# Patient Record
Sex: Male | Born: 1955 | Race: White | Hispanic: No | Marital: Married | Smoking: Current every day smoker
Health system: Southern US, Community
[De-identification: ages and names within clinical notes are randomized; demographics above are authoritative.]

## PROBLEM LIST (undated history)

## (undated) DIAGNOSIS — E119 Type 2 diabetes mellitus without complications: Secondary | ICD-10-CM

## (undated) DIAGNOSIS — Z8719 Personal history of other diseases of the digestive system: Secondary | ICD-10-CM

## (undated) DIAGNOSIS — K7460 Unspecified cirrhosis of liver: Secondary | ICD-10-CM

## (undated) DIAGNOSIS — K759 Inflammatory liver disease, unspecified: Secondary | ICD-10-CM

## (undated) DIAGNOSIS — I639 Cerebral infarction, unspecified: Secondary | ICD-10-CM

## (undated) DIAGNOSIS — I10 Essential (primary) hypertension: Secondary | ICD-10-CM

## (undated) DIAGNOSIS — J449 Chronic obstructive pulmonary disease, unspecified: Secondary | ICD-10-CM

## (undated) HISTORY — PX: BACK SURGERY: SHX140

## (undated) HISTORY — PX: SHOULDER SURGERY: SHX246

## (undated) HISTORY — DX: Cerebral infarction, unspecified: I63.9

## (undated) HISTORY — DX: Personal history of other diseases of the digestive system: Z87.19

## (undated) HISTORY — PX: KNEE SURGERY: SHX244

---

## 1998-04-02 ENCOUNTER — Emergency Department (HOSPITAL_COMMUNITY): Admission: EM | Admit: 1998-04-02 | Discharge: 1998-04-02 | Payer: Self-pay | Admitting: Emergency Medicine

## 2000-04-07 ENCOUNTER — Emergency Department (HOSPITAL_COMMUNITY): Admission: EM | Admit: 2000-04-07 | Discharge: 2000-04-07 | Payer: Self-pay | Admitting: *Deleted

## 2000-04-21 ENCOUNTER — Ambulatory Visit (HOSPITAL_COMMUNITY)
Admission: RE | Admit: 2000-04-21 | Discharge: 2000-04-21 | Payer: Self-pay | Admitting: Physical Medicine and Rehabilitation

## 2000-04-21 ENCOUNTER — Encounter: Payer: Self-pay | Admitting: Physical Medicine and Rehabilitation

## 2000-09-04 ENCOUNTER — Encounter: Admission: RE | Admit: 2000-09-04 | Discharge: 2000-09-04 | Payer: Self-pay | Admitting: Family Medicine

## 2001-05-17 ENCOUNTER — Observation Stay (HOSPITAL_COMMUNITY): Admission: RE | Admit: 2001-05-17 | Discharge: 2001-05-18 | Payer: Self-pay | Admitting: Orthopedic Surgery

## 2001-05-17 ENCOUNTER — Encounter: Payer: Self-pay | Admitting: Orthopedic Surgery

## 2001-12-20 ENCOUNTER — Emergency Department (HOSPITAL_COMMUNITY): Admission: EM | Admit: 2001-12-20 | Discharge: 2001-12-20 | Payer: Self-pay | Admitting: Emergency Medicine

## 2001-12-20 ENCOUNTER — Encounter: Payer: Self-pay | Admitting: Emergency Medicine

## 2003-08-21 ENCOUNTER — Encounter: Admission: RE | Admit: 2003-08-21 | Discharge: 2003-08-21 | Payer: Self-pay | Admitting: Family Medicine

## 2003-08-21 ENCOUNTER — Ambulatory Visit (HOSPITAL_COMMUNITY): Admission: RE | Admit: 2003-08-21 | Discharge: 2003-08-21 | Payer: Self-pay | Admitting: Family Medicine

## 2003-10-30 ENCOUNTER — Encounter: Admission: RE | Admit: 2003-10-30 | Discharge: 2003-10-30 | Payer: Self-pay | Admitting: Sports Medicine

## 2003-10-30 ENCOUNTER — Encounter: Admission: RE | Admit: 2003-10-30 | Discharge: 2003-10-30 | Payer: Self-pay | Admitting: Family Medicine

## 2005-06-26 ENCOUNTER — Ambulatory Visit (HOSPITAL_BASED_OUTPATIENT_CLINIC_OR_DEPARTMENT_OTHER): Admission: RE | Admit: 2005-06-26 | Discharge: 2005-06-26 | Payer: Self-pay | Admitting: Orthopedic Surgery

## 2005-06-26 ENCOUNTER — Ambulatory Visit (HOSPITAL_COMMUNITY): Admission: RE | Admit: 2005-06-26 | Discharge: 2005-06-26 | Payer: Self-pay | Admitting: Orthopedic Surgery

## 2006-03-05 ENCOUNTER — Inpatient Hospital Stay (HOSPITAL_COMMUNITY): Admission: RE | Admit: 2006-03-05 | Discharge: 2006-03-07 | Payer: Self-pay | Admitting: Orthopaedic Surgery

## 2006-11-19 DIAGNOSIS — M545 Low back pain, unspecified: Secondary | ICD-10-CM | POA: Insufficient documentation

## 2006-11-19 DIAGNOSIS — F17200 Nicotine dependence, unspecified, uncomplicated: Secondary | ICD-10-CM

## 2017-05-07 DIAGNOSIS — C186 Malignant neoplasm of descending colon: Secondary | ICD-10-CM

## 2018-09-02 ENCOUNTER — Emergency Department (HOSPITAL_COMMUNITY): Payer: Medicare Other

## 2018-09-02 ENCOUNTER — Inpatient Hospital Stay (HOSPITAL_COMMUNITY)
Admission: EM | Admit: 2018-09-02 | Discharge: 2018-09-07 | DRG: 981 | Disposition: A | Discharge status: Skilled Nursing Facility | Payer: Medicare Other | Attending: Internal Medicine | Admitting: Internal Medicine

## 2018-09-02 ENCOUNTER — Other Ambulatory Visit: Payer: Self-pay

## 2018-09-02 ENCOUNTER — Encounter (HOSPITAL_COMMUNITY): Payer: Self-pay | Admitting: Emergency Medicine

## 2018-09-02 DIAGNOSIS — R29703 NIHSS score 3: Secondary | ICD-10-CM | POA: Diagnosis not present

## 2018-09-02 DIAGNOSIS — I6340 Cerebral infarction due to embolism of unspecified cerebral artery: Secondary | ICD-10-CM | POA: Diagnosis present

## 2018-09-02 DIAGNOSIS — W1830XA Fall on same level, unspecified, initial encounter: Secondary | ICD-10-CM | POA: Diagnosis present

## 2018-09-02 DIAGNOSIS — R29702 NIHSS score 2: Secondary | ICD-10-CM | POA: Diagnosis not present

## 2018-09-02 DIAGNOSIS — K7460 Unspecified cirrhosis of liver: Secondary | ICD-10-CM | POA: Diagnosis present

## 2018-09-02 DIAGNOSIS — R488 Other symbolic dysfunctions: Secondary | ICD-10-CM | POA: Diagnosis present

## 2018-09-02 DIAGNOSIS — G9340 Encephalopathy, unspecified: Secondary | ICD-10-CM

## 2018-09-02 DIAGNOSIS — C189 Malignant neoplasm of colon, unspecified: Secondary | ICD-10-CM | POA: Diagnosis present

## 2018-09-02 DIAGNOSIS — F17200 Nicotine dependence, unspecified, uncomplicated: Secondary | ICD-10-CM | POA: Diagnosis present

## 2018-09-02 DIAGNOSIS — R4182 Altered mental status, unspecified: Secondary | ICD-10-CM

## 2018-09-02 DIAGNOSIS — Z8673 Personal history of transient ischemic attack (TIA), and cerebral infarction without residual deficits: Secondary | ICD-10-CM | POA: Diagnosis not present

## 2018-09-02 DIAGNOSIS — Z419 Encounter for procedure for purposes other than remedying health state, unspecified: Secondary | ICD-10-CM

## 2018-09-02 DIAGNOSIS — G3184 Mild cognitive impairment, so stated: Secondary | ICD-10-CM | POA: Diagnosis present

## 2018-09-02 DIAGNOSIS — D696 Thrombocytopenia, unspecified: Secondary | ICD-10-CM | POA: Diagnosis present

## 2018-09-02 DIAGNOSIS — Z8619 Personal history of other infectious and parasitic diseases: Secondary | ICD-10-CM | POA: Diagnosis not present

## 2018-09-02 DIAGNOSIS — R29704 NIHSS score 4: Secondary | ICD-10-CM | POA: Diagnosis present

## 2018-09-02 DIAGNOSIS — I10 Essential (primary) hypertension: Secondary | ICD-10-CM | POA: Diagnosis present

## 2018-09-02 DIAGNOSIS — I6381 Other cerebral infarction due to occlusion or stenosis of small artery: Secondary | ICD-10-CM

## 2018-09-02 DIAGNOSIS — R4701 Aphasia: Secondary | ICD-10-CM | POA: Diagnosis present

## 2018-09-02 DIAGNOSIS — S72001A Fracture of unspecified part of neck of right femur, initial encounter for closed fracture: Secondary | ICD-10-CM | POA: Diagnosis present

## 2018-09-02 DIAGNOSIS — Y92129 Unspecified place in nursing home as the place of occurrence of the external cause: Secondary | ICD-10-CM | POA: Diagnosis not present

## 2018-09-02 DIAGNOSIS — Z79891 Long term (current) use of opiate analgesic: Secondary | ICD-10-CM

## 2018-09-02 DIAGNOSIS — G9341 Metabolic encephalopathy: Secondary | ICD-10-CM | POA: Diagnosis present

## 2018-09-02 DIAGNOSIS — E119 Type 2 diabetes mellitus without complications: Secondary | ICD-10-CM | POA: Diagnosis present

## 2018-09-02 DIAGNOSIS — J439 Emphysema, unspecified: Secondary | ICD-10-CM | POA: Diagnosis present

## 2018-09-02 DIAGNOSIS — Z79899 Other long term (current) drug therapy: Secondary | ICD-10-CM

## 2018-09-02 DIAGNOSIS — M316 Other giant cell arteritis: Secondary | ICD-10-CM | POA: Diagnosis present

## 2018-09-02 DIAGNOSIS — Z8781 Personal history of (healed) traumatic fracture: Secondary | ICD-10-CM

## 2018-09-02 DIAGNOSIS — Z9889 Other specified postprocedural states: Secondary | ICD-10-CM

## 2018-09-02 HISTORY — DX: Unspecified cirrhosis of liver: K74.60

## 2018-09-02 HISTORY — DX: Fracture of unspecified part of neck of right femur, initial encounter for closed fracture: S72.001A

## 2018-09-02 HISTORY — DX: Other cerebral infarction due to occlusion or stenosis of small artery: I63.81

## 2018-09-02 HISTORY — DX: Encephalopathy, unspecified: G93.40

## 2018-09-02 HISTORY — DX: Type 2 diabetes mellitus without complications: E11.9

## 2018-09-02 HISTORY — DX: Essential (primary) hypertension: I10

## 2018-09-02 HISTORY — DX: Chronic obstructive pulmonary disease, unspecified: J44.9

## 2018-09-02 HISTORY — DX: Inflammatory liver disease, unspecified: K75.9

## 2018-09-02 LAB — COMPREHENSIVE METABOLIC PANEL
ALBUMIN: 4.2 g/dL (ref 3.5–5.0)
ALT: 18 U/L (ref 0–44)
ANION GAP: 13 (ref 5–15)
AST: 23 U/L (ref 15–41)
Alkaline Phosphatase: 117 U/L (ref 38–126)
BUN: 9 mg/dL (ref 8–23)
CO2: 22 mmol/L (ref 22–32)
CREATININE: 0.93 mg/dL (ref 0.61–1.24)
Calcium: 9.3 mg/dL (ref 8.9–10.3)
Chloride: 104 mmol/L (ref 98–111)
GFR calc Af Amer: >60 mL/min (ref >60)
GFR calc non Af Amer: >60 mL/min (ref >60)
Glucose, Bld: 188 mg/dL — ABNORMAL HIGH (ref 70–99)
Potassium: 4 mmol/L (ref 3.5–5.1)
Sodium: 139 mmol/L (ref 135–145)
Total Bilirubin: 2.2 mg/dL — ABNORMAL HIGH (ref 0.3–1.2)
Total Protein: 6.7 g/dL (ref 6.5–8.1)

## 2018-09-02 LAB — CBC WITH DIFFERENTIAL/PLATELET
Abs Immature Granulocytes: 0.03 10*3/uL (ref 0.00–0.07)
BASOS ABS: 0 10*3/uL (ref 0.0–0.1)
Basophils Relative: 0 %
Eosinophils Absolute: 0.3 10*3/uL (ref 0.0–0.5)
Eosinophils Relative: 3 %
HCT: 50.1 % (ref 39.0–52.0)
Hemoglobin: 16.1 g/dL (ref 13.0–17.0)
Immature Granulocytes: 0 %
Lymphocytes Relative: 10 %
Lymphs Abs: 0.9 10*3/uL (ref 0.7–4.0)
MCH: 28.3 pg (ref 26.0–34.0)
MCHC: 32.1 g/dL (ref 30.0–36.0)
MCV: 88.2 fL (ref 80.0–100.0)
Monocytes Absolute: 0.6 10*3/uL (ref 0.1–1.0)
Monocytes Relative: 7 %
Neutro Abs: 6.8 10*3/uL (ref 1.7–7.7)
Neutrophils Relative %: 80 %
Platelets: 90 10*3/uL — ABNORMAL LOW (ref 150–400)
RBC: 5.68 MIL/uL (ref 4.22–5.81)
RDW: 12.9 % (ref 11.5–15.5)
WBC: 8.6 10*3/uL (ref 4.0–10.5)
nRBC: 0 % (ref 0.0–0.2)

## 2018-09-02 LAB — URINALYSIS, COMPLETE (UACMP) WITH MICROSCOPIC
Bilirubin Urine: NEGATIVE
GLUCOSE, UA: 50 mg/dL — AB
Hgb urine dipstick: NEGATIVE
KETONES UR: 5 mg/dL — AB
Leukocytes, UA: NEGATIVE
Nitrite: NEGATIVE
Protein, ur: NEGATIVE mg/dL
SPECIFIC GRAVITY, URINE: 1.012 (ref 1.005–1.030)
pH: 6 (ref 5.0–8.0)

## 2018-09-02 LAB — I-STAT VENOUS BLOOD GAS, ED
Acid-Base Excess: 3 mmol/L — ABNORMAL HIGH (ref 0.0–2.0)
Bicarbonate: 25.4 mmol/L (ref 20.0–28.0)
O2 SAT: 86 %
PCO2 VEN: 32.3 mmHg — AB (ref 44.0–60.0)
TCO2: 26 mmol/L (ref 22–32)
pH, Ven: 7.504 — ABNORMAL HIGH (ref 7.250–7.430)
pO2, Ven: 46 mmHg — ABNORMAL HIGH (ref 32.0–45.0)

## 2018-09-02 LAB — ETHANOL: Alcohol, Ethyl (B): <10 mg/dL (ref <10)

## 2018-09-02 LAB — RAPID URINE DRUG SCREEN, HOSP PERFORMED
Amphetamines: NOT DETECTED
Barbiturates: NOT DETECTED
Benzodiazepines: NOT DETECTED
Cocaine: NOT DETECTED
Opiates: NOT DETECTED
Tetrahydrocannabinol: NOT DETECTED

## 2018-09-02 LAB — CBG MONITORING, ED: Glucose-Capillary: 191 mg/dL — ABNORMAL HIGH (ref 70–99)

## 2018-09-02 LAB — I-STAT CG4 LACTIC ACID, ED: Lactic Acid, Venous: 2.06 mmol/L (ref 0.5–1.9)

## 2018-09-02 LAB — AMMONIA: AMMONIA: 27 umol/L (ref 9–35)

## 2018-09-02 MED ORDER — MORPHINE SULFATE (PF) 2 MG/ML IV SOLN
0.5000 mg | INTRAVENOUS | Status: DC | PRN
  Administered 2018-09-04: 0.5 mg via INTRAVENOUS
  Filled 2018-09-02: qty 1

## 2018-09-02 MED ORDER — HYDROCODONE-ACETAMINOPHEN 5-325 MG PO TABS
1.0000 | ORAL_TABLET | Freq: Four times a day (QID) | ORAL | Status: DC | PRN
  Administered 2018-09-03 – 2018-09-04 (×3): 1 via ORAL
  Filled 2018-09-02 (×3): qty 1

## 2018-09-02 MED ORDER — PREGABALIN 75 MG PO CAPS
150.0000 mg | ORAL_CAPSULE | Freq: Two times a day (BID) | ORAL | Status: DC
  Administered 2018-09-03 – 2018-09-07 (×8): 150 mg via ORAL
  Filled 2018-09-02 (×8): qty 2

## 2018-09-02 MED ORDER — SODIUM CHLORIDE 0.9 % IV BOLUS
1000.0000 mL | Freq: Once | INTRAVENOUS | Status: AC
  Administered 2018-09-02: 1000 mL via INTRAVENOUS

## 2018-09-02 MED ORDER — IOHEXOL 300 MG/ML  SOLN
100.0000 mL | Freq: Once | INTRAMUSCULAR | Status: AC | PRN
  Administered 2018-09-02: 100 mL via INTRAVENOUS

## 2018-09-02 MED ORDER — STROKE: EARLY STAGES OF RECOVERY BOOK
Freq: Once | Status: AC
  Administered 2018-09-03: 01:00:00
  Filled 2018-09-02: qty 1

## 2018-09-02 NOTE — ED Notes (Signed)
Admitting MD Gardner at bedside. 

## 2018-09-02 NOTE — H&P (Signed)
History and Physical    Weyman Bogdon QVZ:[id number] DOB: 19-Sep-1956 DOA: 09/02/2018  PCP: Patient, No Pcp Per  Patient coming from: Home  I have personally briefly reviewed patient's old medical records in [place]  Chief Complaint: AMS  HPI: Taiyo Kozma is a 62 y.o. male with medical history significant of COPD, cirrhosis, HTN.  Patient presents to the ED with confusion and AMS.  Had been disoriented for past couple of months per granddaughter but acutely worse after a fall yesterday.  Yesterday was at [facility] visiting wife.  Fell.  Severe R hip pain following fall worse with ambulation or movement.  Symptoms persistent today.  Does take chronic oxycodone and robaxin.   ED Course: R hip nondisp femoral neck fx.  Ortho consulted and will see patient in AM.  CT head shows age indeterminate lacunar infarcts.   Review of Systems: As per HPI otherwise 10 point review of systems negative.   Past Medical History:  Diagnosis Date  . Cirrhosis ([place])   . COPD (chronic obstructive pulmonary disease) ([place])   . Diabetes mellitus without complication ([place])   . Hepatitis   . Hypertension     Past Surgical History:  Procedure Laterality Date  . BACK SURGERY    . KNEE SURGERY    . SHOULDER SURGERY       reports that he has been smoking. He does not have any smokeless tobacco history on file. He reports that he does not drink alcohol or use drugs.  No Known Allergies  Family History  Problem Relation Age of Onset  . Osteoporosis Neg Hx      Prior to Admission medications   Medication Sig Start Date End Date Taking? Authorizing Provider  methocarbamol (ROBAXIN) 750 MG tablet Take 750-1,500 mg by mouth See admin instructions. Take 1 tablet by mouth morning/noon as needed and take 2 tabs at bedtime as needed   Yes [provider]  oxyCODONE (ROXICODONE) 15 MG immediate release tablet Take 15 mg by mouth 3 (three) times daily.   Yes [provider]    pregabalin (LYRICA) 150 MG capsule Take 150 mg by mouth 2 (two) times daily.   Yes [provider]    Physical Exam: Vitals:   09/02/18 2115 09/02/18 2215 09/02/18 2245 09/02/18 2300  BP: (!) 179/92 (!) 152/110    Pulse: 80 93 84 79  Resp: 15 18 16 16   Temp:      TempSrc:      SpO2: 97% 97% 98% 97%  Weight:      Height:        Constitutional: NAD, calm, comfortable Eyes: PERRL, lids and conjunctivae normal ENMT: Mucous membranes are moist. Posterior pharynx clear of any exudate or lesions.Normal dentition.  Neck: normal, supple, no masses, no thyromegaly Respiratory: clear to auscultation bilaterally, no wheezing, no crackles. Normal respiratory effort. No accessory muscle use.  Cardiovascular: Regular rate and rhythm, no murmurs / rubs / gallops. No extremity edema. 2+ pedal pulses. No carotid bruits.  Abdomen: no tenderness, no masses palpated. No hepatosplenomegaly. Bowel sounds positive.  Musculoskeletal: R hip TTP Skin: no rashes, lesions, ulcers. No induration Neurologic: CN 2-12 grossly intact. Sensation intact, DTR normal. Strength 5/5 in all 4.  Psychiatric: Mild confusion  Labs on Admission: I have personally reviewed following labs and imaging studies  CBC: Recent Labs  Lab 09/02/18 1844  WBC 8.6  NEUTROABS 6.8  HGB 16.1  HCT 50.1  MCV 88.2  PLT 90*   Basic  Metabolic Panel: Recent Labs  Lab 09/02/18 1844  NA 139  K 4.0  CL 104  CO2 22  GLUCOSE 188*  BUN 9  CREATININE 0.93  CALCIUM 9.3   GFR: Estimated Creatinine Clearance: 89.2 mL/min (by C-G formula based on SCr of 0.93 mg/dL). Liver Function Tests: Recent Labs  Lab 09/02/18 1844  AST 23  ALT 18  ALKPHOS 117  BILITOT 2.2*  PROT 6.7  ALBUMIN 4.2   No results for input(s): LIPASE, AMYLASE in the last 168 hours. Recent Labs  Lab 09/02/18 1845  AMMONIA 27   Coagulation Profile: No results for input(s): INR, PROTIME in the last 168 hours. Cardiac Enzymes: No results for  input(s): CKTOTAL, CKMB, CKMBINDEX, TROPONINI in the last 168 hours. BNP (last 3 results) No results for input(s): PROBNP in the last 8760 hours. HbA1C: No results for input(s): HGBA1C in the last 72 hours. CBG: Recent Labs  Lab 09/02/18 1857  GLUCAP 191*   Lipid Profile: No results for input(s): CHOL, HDL, LDLCALC, TRIG, CHOLHDL, LDLDIRECT in the last 72 hours. Thyroid Function Tests: No results for input(s): TSH, T4TOTAL, FREET4, T3FREE, THYROIDAB in the last 72 hours. Anemia Panel: No results for input(s): VITAMINB12, FOLATE, FERRITIN, TIBC, IRON, RETICCTPCT in the last 72 hours. Urine analysis:    Component Value Date/Time   COLORURINE YELLOW 09/02/2018 2009   APPEARANCEUR CLEAR 09/02/2018 2009   LABSPEC 1.012 09/02/2018 2009   PHURINE 6.0 09/02/2018 2009   GLUCOSEU 50 (A) 09/02/2018 2009   HGBUR NEGATIVE 09/02/2018 2009   [place] NEGATIVE 09/02/2018 2009   KETONESUR 5 (A) 09/02/2018 2009   PROTEINUR NEGATIVE 09/02/2018 2009   NITRITE NEGATIVE 09/02/2018 2009   LEUKOCYTESUR NEGATIVE 09/02/2018 2009    Radiological Exams on Admission: Ct Head Wo Contrast  Result Date: 09/02/2018 CLINICAL DATA:  Disoriented EXAM: CT HEAD WITHOUT CONTRAST TECHNIQUE: Contiguous axial images were obtained from the base of the skull through the vertex without intravenous contrast. COMPARISON:  MRI 05/07/2005 FINDINGS: Brain: No acute territorial infarction, hemorrhage or intracranial mass is visualized. Chronic infarct within the left basal ganglia and white matter. Age indeterminate small lacunar infarcts within the thalamus and right basal ganglia. Atrophy with moderate small vessel ischemic changes of the white matter. Stable ventricle size. Vascular: No hyperdense vessels.  Carotid vascular calcification. Skull: Normal. Negative for fracture or focal lesion. Sinuses/Orbits: Mucosal thickening in the ethmoid and maxillary sinuses. Other: None. IMPRESSION: 1. Small age indeterminate lacunar  infarcts within the thalamus and right basal ganglia. 2. 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 Atrophy and small vessel ischemic changes of the white matter. Electronically Signed   By: Donavan Foil M.D.   On: 09/02/2018 21:25   Ct Abdomen Pelvis W Contrast  Result Date: 09/02/2018 CLINICAL DATA:  Abdominal distension EXAM: CT ABDOMEN AND PELVIS WITH CONTRAST TECHNIQUE: Multidetector CT imaging of the abdomen and pelvis was performed using the standard protocol following bolus administration of intravenous contrast. CONTRAST:  165mL OMNIPAQUE IOHEXOL 300 MG/ML  SOLN COMPARISON:  05/21/2011 FINDINGS: Lower chest: Lung bases demonstrate a stable right lower lobe pulmonary nodule. No acute consolidation or effusion. The heart size is normal. Hepatobiliary: Subtle contour nodularity of the liver suggesting cirrhosis. Status post cholecystectomy. No biliary dilatation. Pancreas: Unremarkable. No pancreatic ductal dilatation or surrounding inflammatory changes. Spleen: Slightly enlarged. Adrenals/Urinary Tract: Adrenal glands are normal. No hydronephrosis. Small stones in the mid right kidney measuring up to 4 mm. Cyst in the lower pole of the left kidney. Bladder is  normal. Stomach/Bowel: Stomach nonenlarged. No dilated small bowel. No colon wall thickening. Negative appendix. Vascular/Lymphatic: Moderate aortic atherosclerosis and mural thrombus. No aneurysm. No significantly enlarged lymph nodes. Reproductive: Slightly enlarged prostate Other: No free air or free fluid. Musculoskeletal: Postsurgical changes at L5-S1 with stabilization rods and fixating screws. Acute, slightly impacted appearing right femoral neck fracture. IMPRESSION: 1. No CT evidence for acute intra-abdominal or pelvic abnormality. 2. Subtle contour nodularity of the liver suggesting cirrhosis. The spleen appears slightly enlarged 3. Findings suspicious for acute mildly impacted right subcapital femoral neck fracture. 4.  Nonobstructing stones in the right kidney Electronically Signed   By: Donavan Foil M.D.   On: 09/02/2018 21:19   Dg Chest Port 1 View  Result Date: 09/02/2018 CLINICAL DATA:  Altered mental status. EXAM: PORTABLE CHEST 1 VIEW COMPARISON:  Radiographs of November 03, 2016. FINDINGS: The heart size and mediastinal contours are within normal limits. Both lungs are clear. The visualized skeletal structures are unremarkable. IMPRESSION: No active disease. Electronically Signed   By: Marijo Conception, M.D.   On: 09/02/2018 19:01   Dg Hip Unilat With Pelvis 2-3 Views Right  Result Date: 09/02/2018 CLINICAL DATA:  62 year old male with fall and right hip pain. EXAM: DG HIP (WITH OR WITHOUT PELVIS) 2-3V RIGHT COMPARISON:  CT of the abdomen pelvis dated 09/02/2018 FINDINGS: There is a nondisplaced mildly impacted fracture of the right femoral neck with foreshortening of the femoral neck. No other acute fracture identified. The bones are osteopenic. There is no dislocation. Mild to moderate bilateral hip arthritic changes. Lower lumbar fusion screws. The soft tissues are grossly unremarkable. Excreted contrast noted within the bladder. IMPRESSION: Nondisplaced right femoral neck fracture.  No dislocation. Electronically Signed   By: Anner Crete M.D.   On: 09/02/2018 22:48    EKG: Independently reviewed.  Assessment/Plan Principal Problem:   Fracture of femoral neck, right, closed (HCC) Active Problems:   Acute encephalopathy   Multiple lacunar infarcts (HCC)    1. R femoral neck fx - 1. Hip fx pathway 2. NPO after MN 3. Ortho consulted and to see in AM for likely operative repair 2. Acute on chronic encephalopathy - 1. Acute component believed to be either due to pain from fracture or due to increased opiates hes taking for pain (since he was managing to ambulate around on the fracture today somehow). 2. Concern chronic component may be related to lacunar infarcts 3. Will obtain MRI brain  since these listed as age indeterminate. 4. Will put on stroke pathway for work up. 5. Holding off on ASA for the moment since he will need surgery for hip. 6. Consult neuro based on results of MRI brain.  DVT prophylaxis: SCDs Code Status: Full Family Communication: Family at bedside Disposition Plan: SNF after admit Consults called: Ortho Admission status: Admit to inpatient  Severity of Illness: The appropriate patient status for this patient is INPATIENT. Inpatient status is judged to be reasonable and necessary in order to provide the required intensity of service to ensure the patient's safety. The patient's presenting symptoms, physical exam findings, and initial radiographic and laboratory data in the context of their chronic comorbidities is felt to place them at high risk for further clinical deterioration. Furthermore, it is not anticipated that the patient will be medically stable for discharge from the hospital within 2 midnights of admission. The following factors support the patient status of inpatient.   " The patient's presenting symptoms include AMS, Hip pain. " The worrisome physical  exam findings include hip pain. " The initial radiographic and laboratory data are worrisome because of Hip fx, age indeterminate lacunar infarcts.   * I certify that at the point of admission it is my clinical judgment that the patient will require inpatient hospital care spanning beyond 2 midnights from the point of admission due to high intensity of service, high risk for further deterioration and high frequency of surveillance required.Etta Quill DO [facility] Pager [phone] Only works nights!  If 7AM-7PM, please contact the primary day team physician taking care of patient  [email or web address] Password [facility]  09/02/2018, 11:28 PM

## 2018-09-02 NOTE — ED Notes (Signed)
Patient transported to X-ray 

## 2018-09-02 NOTE — ED Provider Notes (Signed)
[name] Provider Note   CSN: [id number] Arrival date & time: 09/02/18  7673     History   Chief Complaint Chief Complaint  Patient presents with  . Altered Mental Status    HPI Matthew Roman is a 62 y.o. male.  The history is provided by the patient and a relative.  Altered Mental Status   This is a recurrent problem. The current episode started more than 1 week ago. Progression since onset: waxing and waning. Associated symptoms include confusion. Pertinent negatives include no seizures, no unresponsiveness, no weakness, no delusions, no self-injury and no violence. Risk factors: unknown, cirrhosis hx. His past medical history is significant for liver disease, hypertension and COPD. His past medical history does not include dementia or head trauma.    Past Medical History:  Diagnosis Date  . Cirrhosis ([place])   . COPD (chronic obstructive pulmonary disease) ([place])   . Diabetes mellitus without complication ([place])   . Hepatitis   . Hypertension     Patient Active Problem List   Diagnosis Date Noted  . Acute encephalopathy 09/02/2018  . Multiple lacunar infarcts ([place]) 09/02/2018  . Fracture of femoral neck, right, closed ([place]) 09/02/2018  . TOBACCO DEPENDENCE 11/19/2006  . BACK PAIN, LOW 11/19/2006    Past Surgical History:  Procedure Laterality Date  . BACK SURGERY    . KNEE SURGERY    . SHOULDER SURGERY          Home Medications    Prior to Admission medications   Medication Sig Start Date End Date Taking? Authorizing Provider  methocarbamol (ROBAXIN) 750 MG tablet Take 750-1,500 mg by mouth See admin instructions. Take 1 tablet by mouth morning/noon as needed and take 2 tabs at bedtime as needed   Yes [provider]  oxyCODONE (ROXICODONE) 15 MG immediate release tablet Take 15 mg by mouth 3 (three) times daily.   Yes [provider]  pregabalin (LYRICA) 150 MG capsule Take 150 mg by mouth 2 (two)  times daily.   Yes [provider]    Family History Family History  Problem Relation Age of Onset  . Osteoporosis Neg Hx     Social History Social History   Tobacco Use  . Smoking status: Current Every Day Smoker  Substance Use Topics  . Alcohol use: Never    Frequency: Never  . Drug use: Never     Allergies   Patient has no known allergies.   Review of Systems Review of Systems  Constitutional: Negative for chills and fever.  HENT: Negative for ear pain and sore throat.   Eyes: Negative for pain and visual disturbance.  Respiratory: Negative for cough and shortness of breath.   Cardiovascular: Negative for chest pain and palpitations.  Gastrointestinal: Negative for abdominal pain and vomiting.  Genitourinary: Negative for dysuria and hematuria.  Musculoskeletal: Positive for gait problem (right hip pain ). Negative for arthralgias and back pain.  Skin: Negative for color change and rash.  Neurological: Negative for seizures, syncope and weakness.  Psychiatric/Behavioral: Positive for confusion. Negative for self-injury.  All other systems reviewed and are negative.    Physical Exam Updated Vital Signs  ED Triage Vitals  Enc Vitals Group     BP 09/02/18 1830 (!) 168/97     Pulse Rate 09/02/18 1830 87     Resp 09/02/18 1845 (!) 21     Temp 09/02/18 1836 98 F (36.7 C)     Temp Source 09/02/18 1836  Oral     SpO2 09/02/18 1830 98 %     Weight 09/02/18 1836 190 lb (86.2 kg)     Height 09/02/18 1836 6' (1.829 m)     Head Circumference --      Peak Flow --      Pain Score --      Pain Loc --      Pain Edu? --      Excl. in [place]? --     Physical Exam Vitals signs and nursing note reviewed.  Constitutional:      Appearance: Normal appearance. He is well-developed.  HENT:     Head: Normocephalic and atraumatic.     Mouth/Throat:     Mouth: Mucous membranes are moist.  Eyes:     Extraocular Movements: Extraocular movements intact.      Conjunctiva/sclera: Conjunctivae normal.     Pupils: Pupils are equal, round, and reactive to light.  Neck:     Musculoskeletal: Normal range of motion and neck supple. No neck rigidity.  Cardiovascular:     Rate and Rhythm: Normal rate and regular rhythm.     Pulses: Normal pulses.     Heart sounds: Normal heart sounds. No murmur.  Pulmonary:     Effort: Pulmonary effort is normal. No respiratory distress.     Breath sounds: Normal breath sounds.  Abdominal:     General: There is no distension.     Palpations: Abdomen is soft.     Tenderness: There is abdominal tenderness. There is guarding.  Musculoskeletal:        General: Tenderness (right hip) present. No swelling or deformity.  Skin:    General: Skin is warm and dry.     Capillary Refill: Capillary refill takes less than 2 seconds.     Coloration: Skin is not jaundiced.  Neurological:     General: No focal deficit present.     Mental Status: He is alert. He is disoriented.     Cranial Nerves: No cranial nerve deficit.     Sensory: No sensory deficit.     Motor: No weakness.     Coordination: Coordination normal.     Deep Tendon Reflexes: Reflexes normal.     Comments: 5+5 strength throughout, normal sensation throughout, normal finger-to-nose finger, patient confused and slow to answer questions appears to have tenderness in the right hip  Psychiatric:        Mood and Affect: Mood normal.      ED Treatments / Results  Labs (all labs ordered are listed, but only abnormal results are displayed) Labs Reviewed  COMPREHENSIVE METABOLIC PANEL - Abnormal; Notable for the following components:      Result Value   Glucose, Bld 188 (*)    Total Bilirubin 2.2 (*)    All other components within normal limits  CBC WITH DIFFERENTIAL/PLATELET - Abnormal; Notable for the following components:   Platelets 90 (*)    All other components within normal limits  URINALYSIS, COMPLETE (UACMP) WITH MICROSCOPIC - Abnormal; Notable for the  following components:   Glucose, UA 50 (*)    Ketones, ur 5 (*)    Bacteria, UA RARE (*)    All other components within normal limits  CBG MONITORING, ED - Abnormal; Notable for the following components:   Glucose-Capillary 191 (*)    All other components within normal limits  I-STAT VENOUS BLOOD GAS, ED - Abnormal; Notable for the following components:   pH, Ven 7.504 (*)  pCO2, Ven 32.3 (*)    pO2, Ven 46.0 (*)    Acid-Base Excess 3.0 (*)    All other components within normal limits  I-STAT CG4 LACTIC ACID, ED - Abnormal; Notable for the following components:   Lactic Acid, Venous 2.06 (*)    All other components within normal limits  CULTURE, BLOOD (ROUTINE X 2)  CULTURE, BLOOD (ROUTINE X 2)  AMMONIA  RAPID URINE DRUG SCREEN, HOSP PERFORMED  ETHANOL  HIV ANTIBODY (ROUTINE TESTING W REFLEX)  HEMOGLOBIN A1C  LIPID PANEL  I-STAT CG4 LACTIC ACID, ED    EKG None  Radiology Ct Head Wo Contrast  Result Date: 09/02/2018 CLINICAL DATA:  Disoriented EXAM: CT HEAD WITHOUT CONTRAST TECHNIQUE: Contiguous axial images were obtained from the base of the skull through the vertex without intravenous contrast. COMPARISON:  MRI 05/07/2005 FINDINGS: Brain: No acute territorial infarction, hemorrhage or intracranial mass is visualized. Chronic infarct within the left basal ganglia and white matter. Age indeterminate small lacunar infarcts within the thalamus and right basal ganglia. Atrophy with moderate small vessel ischemic changes of the white matter. Stable ventricle size. Vascular: No hyperdense vessels.  Carotid vascular calcification. Skull: Normal. Negative for fracture or focal lesion. Sinuses/Orbits: Mucosal thickening in the ethmoid and maxillary sinuses. Other: None. IMPRESSION: 1. Small age indeterminate lacunar infarcts within the thalamus and right basal ganglia. 2. 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 Atrophy and small vessel ischemic changes of the white matter.  Electronically Signed   By: Donavan Foil M.D.   On: 09/02/2018 21:25   Ct Abdomen Pelvis W Contrast  Result Date: 09/02/2018 CLINICAL DATA:  Abdominal distension EXAM: CT ABDOMEN AND PELVIS WITH CONTRAST TECHNIQUE: Multidetector CT imaging of the abdomen and pelvis was performed using the standard protocol following bolus administration of intravenous contrast. CONTRAST:  171mL OMNIPAQUE IOHEXOL 300 MG/ML  SOLN COMPARISON:  05/21/2011 FINDINGS: Lower chest: Lung bases demonstrate a stable right lower lobe pulmonary nodule. No acute consolidation or effusion. The heart size is normal. Hepatobiliary: Subtle contour nodularity of the liver suggesting cirrhosis. Status post cholecystectomy. No biliary dilatation. Pancreas: Unremarkable. No pancreatic ductal dilatation or surrounding inflammatory changes. Spleen: Slightly enlarged. Adrenals/Urinary Tract: Adrenal glands are normal. No hydronephrosis. Small stones in the mid right kidney measuring up to 4 mm. Cyst in the lower pole of the left kidney. Bladder is normal. Stomach/Bowel: Stomach nonenlarged. No dilated small bowel. No colon wall thickening. Negative appendix. Vascular/Lymphatic: Moderate aortic atherosclerosis and mural thrombus. No aneurysm. No significantly enlarged lymph nodes. Reproductive: Slightly enlarged prostate Other: No free air or free fluid. Musculoskeletal: Postsurgical changes at L5-S1 with stabilization rods and fixating screws. Acute, slightly impacted appearing right femoral neck fracture. IMPRESSION: 1. No CT evidence for acute intra-abdominal or pelvic abnormality. 2. Subtle contour nodularity of the liver suggesting cirrhosis. The spleen appears slightly enlarged 3. Findings suspicious for acute mildly impacted right subcapital femoral neck fracture. 4. Nonobstructing stones in the right kidney Electronically Signed   By: Donavan Foil M.D.   On: 09/02/2018 21:19   Dg Chest Port 1 View  Result Date: 09/02/2018 CLINICAL DATA:   Altered mental status. EXAM: PORTABLE CHEST 1 VIEW COMPARISON:  Radiographs of November 03, 2016. FINDINGS: The heart size and mediastinal contours are within normal limits. Both lungs are clear. The visualized skeletal structures are unremarkable. IMPRESSION: No active disease. Electronically Signed   By: Marijo Conception, M.D.   On: 09/02/2018 19:01   Dg Hip Unilat With Pelvis 2-3 Views  Right  Result Date: 09/02/2018 CLINICAL DATA:  62 year old male with fall and right hip pain. EXAM: DG HIP (WITH OR WITHOUT PELVIS) 2-3V RIGHT COMPARISON:  CT of the abdomen pelvis dated 09/02/2018 FINDINGS: There is a nondisplaced mildly impacted fracture of the right femoral neck with foreshortening of the femoral neck. No other acute fracture identified. The bones are osteopenic. There is no dislocation. Mild to moderate bilateral hip arthritic changes. Lower lumbar fusion screws. The soft tissues are grossly unremarkable. Excreted contrast noted within the bladder. IMPRESSION: Nondisplaced right femoral neck fracture.  No dislocation. Electronically Signed   By: Anner Crete M.D.   On: 09/02/2018 22:48    Procedures Procedures (including critical care time)  Medications Ordered in ED Medications  pregabalin (LYRICA) capsule 150 mg (has no administration in time range)  HYDROcodone-acetaminophen (NORCO/VICODIN) 5-325 MG per tablet 1-2 tablet (has no administration in time range)  morphine 2 MG/ML injection 0.5 mg (has no administration in time range)   stroke: mapping our early stages of recovery book (has no administration in time range)  sodium chloride 0.9 % bolus 1,000 mL (0 mLs Intravenous Stopped 09/02/18 2020)  iohexol (OMNIPAQUE) 300 MG/ML solution 100 mL (100 mLs Intravenous Contrast Given 09/02/18 2056)     Initial Impression / Assessment and Plan / ED Course  I have reviewed the triage vital signs and the nursing notes.  Pertinent labs & imaging results that were available during my care of  the patient were reviewed by me and considered in my medical decision making (see chart for details).     Matthew Roman is a 62 year old male with history of COPD, hypertension, cirrhosis, diabetes who presents to the ED with altered mental status.  Patient with normal vitals.  No fever.  Overall appears confused.  Patient is neurologically intact.  Has abdominal tenderness on exam.  Is able to answer questions but appears to be slow.  Family member states that patient has had mental decline over the last several months but appears worse today.  Patient denies any chest pain, shortness of breath, urinary symptoms.  Denies any alcohol or drug use.  Altered mental status work-up initiated with labs, ammonia, urinalysis, CT of abdomen and pelvis, CT head.  Lab work overall unremarkable.  No significant anemia, electrolyte abnormality, kidney injury.  CT abdomen pelvis shows no acute findings except for possible right femur fracture.  X-ray confirmed right femur neck fracture.  Patient neurovascularly intact in the right lower extremity.  Does state that he had a fall today but overall is unreliable historian.  Patient is tender in the right hip.  CT of the head shows age-indeterminate lacunar infarcts.  Possibly the cause of his altered mental status.  Ammonia was within normal limits.  Urinalysis negative for infection.  Dr. Griffin Basil with orthopedics was consulted and will see the patient in the morning and patient was admitted to the hospital service for further work-up.  Hemodynamically stable throughout my care.  This chart was dictated using voice recognition software.  Despite best efforts to proofread,  errors can occur which can change the documentation meaning.   Final Clinical Impressions(s) / ED Diagnoses   Final diagnoses:  Altered mental status, unspecified altered mental status type  Closed fracture of neck of right femur, initial encounter [facility])    ED Discharge Orders    None         Lennice Sites, DO 09/03/18 0019

## 2018-09-02 NOTE — ED Notes (Signed)
Urine culture tube sent down with urine sample.   

## 2018-09-02 NOTE — ED Notes (Signed)
Pt attempted to eat sandwich before midnight. Pt took one bite and was done. No appetite.

## 2018-09-02 NOTE — ED Triage Notes (Signed)
Pt's granddaugther states that pt has been disoriented for a couple months. Pt was at the NH visiting his wife yesterday and had a fall. DId not hit head and no loc. Not on thinners. She states that she called him 1 hour ago and sounded like he was breathing hard and could not answer any questions. LSN unknown

## 2018-09-02 NOTE — ED Notes (Addendum)
Granddaughter Noah Delaine [phone] Daughter Nira Conn [phone]  Would like to be notified with any updates.

## 2018-09-02 NOTE — ED Notes (Signed)
Patient transported to CT 

## 2018-09-03 ENCOUNTER — Encounter (HOSPITAL_COMMUNITY): Payer: Self-pay

## 2018-09-03 ENCOUNTER — Inpatient Hospital Stay (HOSPITAL_COMMUNITY): Payer: Medicare Other

## 2018-09-03 DIAGNOSIS — J449 Chronic obstructive pulmonary disease, unspecified: Secondary | ICD-10-CM

## 2018-09-03 DIAGNOSIS — K7469 Other cirrhosis of liver: Secondary | ICD-10-CM

## 2018-09-03 DIAGNOSIS — I6381 Other cerebral infarction due to occlusion or stenosis of small artery: Secondary | ICD-10-CM

## 2018-09-03 DIAGNOSIS — J40 Bronchitis, not specified as acute or chronic: Secondary | ICD-10-CM

## 2018-09-03 DIAGNOSIS — I10 Essential (primary) hypertension: Secondary | ICD-10-CM

## 2018-09-03 DIAGNOSIS — Z720 Tobacco use: Secondary | ICD-10-CM

## 2018-09-03 DIAGNOSIS — S72001D Fracture of unspecified part of neck of right femur, subsequent encounter for closed fracture with routine healing: Secondary | ICD-10-CM

## 2018-09-03 LAB — LIPID PANEL
CHOLESTEROL: 165 mg/dL (ref 0–200)
HDL: 32 mg/dL — ABNORMAL LOW (ref >40)
LDL Cholesterol: 113 mg/dL — ABNORMAL HIGH (ref 0–99)
Total CHOL/HDL Ratio: 5.2 RATIO
Triglycerides: 101 mg/dL (ref <150)
VLDL: 20 mg/dL (ref 0–40)

## 2018-09-03 LAB — PROTIME-INR
INR: 1.19
Prothrombin Time: 15 seconds (ref 11.4–15.2)

## 2018-09-03 LAB — HEMOGLOBIN A1C
HEMOGLOBIN A1C: 5.3 % (ref 4.8–5.6)
Mean Plasma Glucose: 105.41 mg/dL

## 2018-09-03 LAB — SEDIMENTATION RATE: Sed Rate: 0 mm/hr (ref 0–16)

## 2018-09-03 LAB — ECHOCARDIOGRAM COMPLETE
Height: 72 in
Weight: 3040 oz

## 2018-09-03 LAB — HIV ANTIBODY (ROUTINE TESTING W REFLEX): HIV SCREEN 4TH GENERATION: NONREACTIVE

## 2018-09-03 LAB — MRSA PCR SCREENING: MRSA BY PCR: NEGATIVE

## 2018-09-03 LAB — C-REACTIVE PROTEIN: CRP: 1.8 mg/dL — ABNORMAL HIGH (ref <1.0)

## 2018-09-03 MED ORDER — PERFLUTREN LIPID MICROSPHERE
1.0000 mL | INTRAVENOUS | Status: AC | PRN
  Administered 2018-09-03: 2 mL via INTRAVENOUS
  Filled 2018-09-03: qty 10

## 2018-09-03 MED ORDER — ENSURE ENLIVE PO LIQD
237.0000 mL | Freq: Three times a day (TID) | ORAL | Status: DC
  Administered 2018-09-04 – 2018-09-07 (×8): 237 mL via ORAL

## 2018-09-03 MED ORDER — ASPIRIN EC 81 MG PO TBEC: 81.0000 mg | DELAYED_RELEASE_TABLET | Freq: Every day | ORAL | Status: DC

## 2018-09-03 MED ORDER — ADULT MULTIVITAMIN W/MINERALS CH: 1.0000 | ORAL_TABLET | Freq: Every day | ORAL | Status: DC

## 2018-09-03 MED ORDER — ATORVASTATIN CALCIUM 10 MG PO TABS
20.0000 mg | ORAL_TABLET | Freq: Every day | ORAL | Status: DC
  Administered 2018-09-04 – 2018-09-06 (×3): 20 mg via ORAL
  Filled 2018-09-03 (×3): qty 2

## 2018-09-03 MED ORDER — ADULT MULTIVITAMIN W/MINERALS CH
1.0000 | ORAL_TABLET | Freq: Every day | ORAL | Status: DC
  Administered 2018-09-05 – 2018-09-07 (×3): 1 via ORAL
  Filled 2018-09-03 (×3): qty 1

## 2018-09-03 MED ORDER — STROKE: EARLY STAGES OF RECOVERY BOOK
Freq: Once | Status: AC
  Administered 2018-09-03: 13:00:00
  Filled 2018-09-03: qty 1

## 2018-09-03 NOTE — Progress Notes (Signed)
Orthopedic Tech Progress Note Patient Details:  Matthew Roman 1956/05/06 [id number]  Patient ID: Matthew Roman, male   DOB: 01-23-1956, 62 y.o.   MRN: [id number] Philippa Chester taught me how to apply OHF.  Karolee Stamps 09/03/2018, 12:44 PM

## 2018-09-03 NOTE — Progress Notes (Signed)
OT Cancellation Note  Patient Details Name: Matthew Roman MRN: [id number] DOB: 07-12-1956   Cancelled Treatment:    Reason Eval/Treat Not Completed: Active bedrest order(please update activity order to allow OT/PT eval)  [name] 09/03/2018, 1:57 PM  [name] OTR/L Acute Rehabilitation Services Pager: [phone] Office: [phone]

## 2018-09-03 NOTE — Progress Notes (Signed)
Echocardiogram 2D Echocardiogram has been performed.  09/03/2018 3:40 PM Maudry Mayhew, MHA, RVT, RDCS, RDMS

## 2018-09-03 NOTE — Progress Notes (Signed)
Initial Nutrition Assessment  DOCUMENTATION CODES:   Not applicable  INTERVENTION:   When diet advanced, add:  Ensure Enlive po BID, each supplement provides 350 kcal and 20 grams of protein  MVI daily  NUTRITION DIAGNOSIS:   Inadequate oral intake related to social / environmental circumstances as evidenced by per patient/family report.  GOAL:   Patient will meet greater than or equal to 90% of their needs  MONITOR:   Diet advancement, PO intake  REASON FOR ASSESSMENT:   Consult Hip fracture protocol  ASSESSMENT:   62 yo male with PMH of DM, COPD, HTN, hepatitis, cirrhosis who was admitted on 12/12 with hip fracture s/p fall.   Patient out of room for a procedure. Spoke with his daughter who was in the room. She reports that patient has been eating poorly since her mother was admitted to a nursing facility 1 month ago. He prepares his own meals, but has been eating fast food a lot over the past month. She reports that he has cirrhosis and is unsure of his usual or current weight.   Labs reviewed. Glucose 188 (H) Medications reviewed.  NUTRITION - FOCUSED PHYSICAL EXAM:  unable to complete NFPE at this time  Diet Order:   Diet Order            Diet NPO time specified  Diet effective now              EDUCATION NEEDS:   No education needs have been identified at this time  Skin:  Skin Assessment: Reviewed RN Assessment  Last BM:  12/12  Height:   Ht Readings from Last 1 Encounters:  09/02/18 6' (1.829 m)    Weight:   Wt Readings from Last 1 Encounters:  09/02/18 86.2 kg  Suspect this is a stated weight.  Ideal Body Weight:  80.9 kg  BMI:  Body mass index is 25.77 kg/m.  Estimated Nutritional Needs:   Kcal:  2050-2250  Protein:  90-110 gm  Fluid:  2 L    Molli Barrows, RD, LDN, [place] Pager [phone] After Hours Pager [phone]

## 2018-09-03 NOTE — Progress Notes (Signed)
PT Cancellation Note  Patient Details Name: Matthew Roman MRN: [id number] DOB: 02-26-1956   Cancelled Treatment:    Reason Eval/Treat Not Completed: (P) Patient not medically ready Pt awaiting Neuro consult secondary to MRI results. PT will follow back as able for Evaluation.  Daric Koren B. Migdalia Dk PT, DPT Acute Rehabilitation Services Pager [phone] Office [phone]    [place] 09/03/2018, 9:34 AM

## 2018-09-03 NOTE — Progress Notes (Signed)
Carotid artery duplex has been completed. Preliminary results can be found in CV Proc through chart review.   09/03/18 12:04 PM Matthew Roman RVT

## 2018-09-03 NOTE — Progress Notes (Signed)
SLP Cancellation Note  Patient Details Name: Matthew Roman MRN: [id number] DOB: July 05, 1956   Cancelled treatment:       Reason Eval/Treat Not Completed: Patient not medically ready. Will f/u for cognitive assessment after surgery if warranted.    Chanler Schreiter, Katherene Ponto 09/03/2018, 7:35 AM

## 2018-09-03 NOTE — Progress Notes (Signed)
PROGRESS NOTE  Matthew Roman PVX:[id number] DOB: 11-Jan-1956 DOA: 09/02/2018 PCP: Patient, No Pcp Per   LOS: 1 day   Brief Narrative / Interim history: 62 year old male with history of COPD, liver cirrhosis, hypertension, who is being brought to the hospital on 12/12 with confusion, disorientation, progressively worse over the last several weeks per granddaughter.  He has had a fall at the nursing home while visiting the wife, followed by severe right hip pain.  He was brought to the ED, was found to have hip fracture and was admitted to the hospital.  Subjective: -Seen this morning, patient seems to be alert to place, time and person, tells me that he has been having balance issues over the last couple of weeks.  During my interview he has word finding difficulty to which he acknowledges that this is been going on for same amount of time.  He denies any focal weakness, denies any numbness or tingling  Assessment & Plan: Principal Problem:   Fracture of femoral neck, right, closed ([place]) Active Problems:   Acute encephalopathy   Multiple lacunar infarcts (HCC)   Principal Problem Acute CVA -CT scan initially in the ED showed small age-indeterminate lacunar infarct within the thalamus and right basal ganglia -This was followed by an MRI today which showed multiple subcentimeter infarcts of mixed age scattered throughout the frontal and parietal white matter, including a prominent subacute infarct in the right medial frontal lobe and acute infarct in the right posterior superior frontal lobe without associated hemorrhage or mass-effect.  It also showed chronic infarcts in multiple other regions with severe chronic microvascular ischemic changes and moderate volume loss of brain for age. -I have consulted neurology, discussed over the phone with Dr. Cheral Marker who will see patient in consultation, appreciate input.  I would favor permissive hypertension and hold surgery for now until fully  evaluated by neurology -We will undergo stroke work-up, hemoglobin A1c was done and was 5.3, lipid panel showed an LDL of 113, will start statin.  Aspirin per neurology given pending hip surgery.  2D echo, CT angiogram pending.  Carotid duplex without significant stenosis  Additional Problems Right femoral neck fracture -Orthopedics consulted, plan for surgery once patient cleared by neurology given acute stroke  Acute on chronic metabolic encephalopathy -Likely in the setting of #1, patient alert and oriented on my evaluation, likely not true encephalopathy but expressive aphasia makes communication difficult and may appear that patient is encephalopathic.  Will discuss with family  Possible liver cirrhosis -CT scan of the abdomen pelvis done on 12/12 showed subtle contour nodularity of the liver suggesting cirrhosis with splenomegaly -Per prior reports he has a history of hep C that was treated in 2017.  Also has a history of alcohol abuse.  COPD/emphysema -In the setting of tobacco use, no wheezing, not an active issue  Colon cancer -Status post a routine screening colonoscopy in 03/2017, 1 of the polyps removed was malignant.  Evaluated by general surgery as an outpatient, and there is a possibility that his cancer was completely removed during the polypectomy and after discussion with the patient he did not wanted surgery but chose for observation and follow-up.  Continue outpatient management   Scheduled Meds: .  stroke: mapping our early stages of recovery book   Does not apply Once  . pregabalin  150 mg Oral BID   Continuous Infusions: PRN Meds:.HYDROcodone-acetaminophen, morphine injection  DVT prophylaxis: SCDs Code Status: Full code Family Communication: No family present at bedside Disposition Plan: Home  versus SNF  Consultants:   Neurology, Dr. Cheral Marker  Orthopedic surgery, Dr. Griffin Basil  Procedures:   2D echo: pending   Antimicrobials:  None     Objective: Vitals:   09/03/18 0007 09/03/18 0800 09/03/18 1017 09/03/18 1203  BP: (!) 183/89 114/71 (!) 176/95 (!) 177/103  Pulse: 71 77 75 83  Resp: 16 19    Temp: 97.8 F (36.6 C) 98.2 F (36.8 C)    TempSrc: Oral Oral    SpO2: 99% 96% 95% 95%  Weight:      Height:        Intake/Output Summary (Last 24 hours) at 09/03/2018 1243 Last data filed at 09/03/2018 0800 Gross per 24 hour  Intake 1000 ml  Output 100 ml  Net 900 ml   Filed Weights   09/02/18 1836  Weight: 86.2 kg    Examination:  Constitutional: NAD Eyes: PERRL, lids and conjunctivae normal ENMT: Mucous membranes are moist.  Neck: normal, supple Respiratory: clear to auscultation bilaterally, no wheezing, no crackles. Normal respiratory effort. No accessory muscle use.  Cardiovascular: Regular rate and rhythm, no murmurs / rubs / gallops. No LE edema.  Abdomen: no tenderness. Bowel sounds positive.  Musculoskeletal: no clubbing / cyanosis.  Skin: no rashes Neurologic: CN 2-12 grossly intact. Strength 5/5 in all 4.  Intermittent expressive aphasia noted Psychiatric: Normal judgment and insight. Alert and oriented x 3. Normal mood.    Data Reviewed: I have independently reviewed following labs and imaging studies  CBC: Recent Labs  Lab 09/02/18 1844  WBC 8.6  NEUTROABS 6.8  HGB 16.1  HCT 50.1  MCV 88.2  PLT 90*   Basic Metabolic Panel: Recent Labs  Lab 09/02/18 1844  NA 139  K 4.0  CL 104  CO2 22  GLUCOSE 188*  BUN 9  CREATININE 0.93  CALCIUM 9.3   GFR: Estimated Creatinine Clearance: 89.2 mL/min (by C-G formula based on SCr of 0.93 mg/dL). Liver Function Tests: Recent Labs  Lab 09/02/18 1844  AST 23  ALT 18  ALKPHOS 117  BILITOT 2.2*  PROT 6.7  ALBUMIN 4.2   No results for input(s): LIPASE, AMYLASE in the last 168 hours. Recent Labs  Lab 09/02/18 1845  AMMONIA 27   Coagulation Profile: Recent Labs  Lab 09/03/18 0825  INR 1.19   Cardiac Enzymes: No results for  input(s): CKTOTAL, CKMB, CKMBINDEX, TROPONINI in the last 168 hours. BNP (last 3 results) No results for input(s): PROBNP in the last 8760 hours. HbA1C: Recent Labs    09/03/18 0141  HGBA1C 5.3   CBG: Recent Labs  Lab 09/02/18 1857  GLUCAP 191*   Lipid Profile: Recent Labs    09/03/18 0141  CHOL 165  HDL 32*  LDLCALC 113*  TRIG 101  CHOLHDL 5.2   Thyroid Function Tests: No results for input(s): TSH, T4TOTAL, FREET4, T3FREE, THYROIDAB in the last 72 hours. Anemia Panel: No results for input(s): VITAMINB12, FOLATE, FERRITIN, TIBC, IRON, RETICCTPCT in the last 72 hours. Urine analysis:    Component Value Date/Time   COLORURINE YELLOW 09/02/2018 2009   APPEARANCEUR CLEAR 09/02/2018 2009   LABSPEC 1.012 09/02/2018 2009   PHURINE 6.0 09/02/2018 2009   GLUCOSEU 50 (A) 09/02/2018 2009   HGBUR NEGATIVE 09/02/2018 2009   [place] NEGATIVE 09/02/2018 2009   KETONESUR 5 (A) 09/02/2018 2009   PROTEINUR NEGATIVE 09/02/2018 2009   NITRITE NEGATIVE 09/02/2018 2009   LEUKOCYTESUR NEGATIVE 09/02/2018 2009   Sepsis Labs: Invalid input(s): PROCALCITONIN, LACTICIDVEN  Recent Results (from  the past 240 hour(s))  Blood culture (routine x 2)     Status: None (Preliminary result)   Collection Time: 09/02/18  7:38 PM  Result Value Ref Range Status   Specimen Description BLOOD LEFT ARM  Final   Special Requests   Final    BOTTLES DRAWN AEROBIC AND ANAEROBIC Blood Culture adequate volume   Culture   Final    NO GROWTH < 12 HOURS Performed at [facility], [address] [place], [place] [address]    Report Status PENDING  Incomplete  Blood culture (routine x 2)     Status: None (Preliminary result)   Collection Time: 09/02/18  7:40 PM  Result Value Ref Range Status   Specimen Description BLOOD LEFT HAND  Final   Special Requests   Final    BOTTLES DRAWN AEROBIC ONLY Blood Culture results may not be optimal due to an excessive volume of blood received in culture bottles    Culture   Final    NO GROWTH < 12 HOURS Performed at [facility], [place] [address], [place], [place] [address]    Report Status PENDING  Incomplete  MRSA PCR Screening     Status: None   Collection Time: 09/03/18  1:02 AM  Result Value Ref Range Status   MRSA by PCR NEGATIVE NEGATIVE Final    Comment:        The GeneXpert MRSA Assay (FDA approved for NASAL specimens only), is one component of a comprehensive MRSA colonization surveillance program. It is not intended to diagnose MRSA infection nor to guide or monitor treatment for MRSA infections. Performed at [facility], [place] [address] [place], [place] [address]       Radiology Studies: Ct Head Wo Contrast  Result Date: 09/02/2018 CLINICAL DATA:  Disoriented EXAM: CT HEAD WITHOUT CONTRAST TECHNIQUE: Contiguous axial images were obtained from the base of the skull through the vertex without intravenous contrast. COMPARISON:  MRI 05/07/2005 FINDINGS: Brain: No acute territorial infarction, hemorrhage or intracranial mass is visualized. Chronic infarct within the left basal ganglia and white matter. Age indeterminate small lacunar infarcts within the thalamus and right basal ganglia. Atrophy with moderate small vessel ischemic changes of the white matter. Stable ventricle size. Vascular: No hyperdense vessels.  Carotid vascular calcification. Skull: Normal. Negative for fracture or focal lesion. Sinuses/Orbits: Mucosal thickening in the ethmoid and maxillary sinuses. Other: None. IMPRESSION: 1. Small age indeterminate lacunar infarcts within the thalamus and right basal ganglia. 2. 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 Atrophy and small vessel ischemic changes of the white matter. Electronically Signed   By: Donavan Foil M.D.   On: 09/02/2018 21:25   Mr Brain Wo Contrast  Result Date: 09/03/2018 CLINICAL DATA:  62 y/o M; chronic confusion and altered mental status acutely worsened after a fall  yesterday. EXAM: MRI HEAD WITHOUT CONTRAST TECHNIQUE: Multiplanar, multiecho pulse sequences of the brain and surrounding structures were obtained without intravenous contrast. COMPARISON:  09/02/2018 CT head.  05/07/2005 MRI head. FINDINGS: Brain: Multiple subcentimeter infarcts of mixed age scattered throughout frontal and parietal white matter including a 9 mm subacute infarct with intermediate diffusion in the right medial frontal lobe (series 5, image 79) and 6 mm acute infarct in the right posterosuperior frontal lobe (series 5 image 84). No associated hemorrhage or mass effect. Chronic hemorrhagic lacunar infarct involving the left lentiform nucleus, medial thalamus, and cerebral peduncle. Additional small chronic lacunar infarcts are present in the bilateral  thalami. Patchy nonspecific T2 FLAIR hyperintensities in subcortical and periventricular white matter as well as the pons are compatible with severe chronic microvascular ischemic changes for age. Moderate volume loss of the brain. No abnormal susceptibility hypointensity to indicate acute intracranial hemorrhage. Vascular: Normal flow voids. Skull and upper cervical spine: Normal marrow signal. Sinuses/Orbits: Negative. Other: None. IMPRESSION: 1. Multiple subcentimeter infarcts of mixed age scattered throughout the frontal and parietal white matter including a prominent subacute infarct in the right medial frontal lobe and acute infarct in the right posterosuperior frontal lobe. No associated acute hemorrhage or mass effect. 2. Chronic infarct of left lentiform nucleus, thalamus, and cerebral peduncle. Small chronic lacunar infarcts of the thalami. 3. Severe chronic microvascular ischemic changes and moderate volume loss of the brain for age. Electronically Signed   By: Kristine Garbe M.D.   On: 09/03/2018 04:37   Ct Abdomen Pelvis W Contrast  Result Date: 09/02/2018 CLINICAL DATA:  Abdominal distension EXAM: CT ABDOMEN AND PELVIS WITH  CONTRAST TECHNIQUE: Multidetector CT imaging of the abdomen and pelvis was performed using the standard protocol following bolus administration of intravenous contrast. CONTRAST:  171mL OMNIPAQUE IOHEXOL 300 MG/ML  SOLN COMPARISON:  05/21/2011 FINDINGS: Lower chest: Lung bases demonstrate a stable right lower lobe pulmonary nodule. No acute consolidation or effusion. The heart size is normal. Hepatobiliary: Subtle contour nodularity of the liver suggesting cirrhosis. Status post cholecystectomy. No biliary dilatation. Pancreas: Unremarkable. No pancreatic ductal dilatation or surrounding inflammatory changes. Spleen: Slightly enlarged. Adrenals/Urinary Tract: Adrenal glands are normal. No hydronephrosis. Small stones in the mid right kidney measuring up to 4 mm. Cyst in the lower pole of the left kidney. Bladder is normal. Stomach/Bowel: Stomach nonenlarged. No dilated small bowel. No colon wall thickening. Negative appendix. Vascular/Lymphatic: Moderate aortic atherosclerosis and mural thrombus. No aneurysm. No significantly enlarged lymph nodes. Reproductive: Slightly enlarged prostate Other: No free air or free fluid. Musculoskeletal: Postsurgical changes at L5-S1 with stabilization rods and fixating screws. Acute, slightly impacted appearing right femoral neck fracture. IMPRESSION: 1. No CT evidence for acute intra-abdominal or pelvic abnormality. 2. Subtle contour nodularity of the liver suggesting cirrhosis. The spleen appears slightly enlarged 3. Findings suspicious for acute mildly impacted right subcapital femoral neck fracture. 4. Nonobstructing stones in the right kidney Electronically Signed   By: Donavan Foil M.D.   On: 09/02/2018 21:19   Dg Chest Port 1 View  Result Date: 09/02/2018 CLINICAL DATA:  Altered mental status. EXAM: PORTABLE CHEST 1 VIEW COMPARISON:  Radiographs of November 03, 2016. FINDINGS: The heart size and mediastinal contours are within normal limits. Both lungs are clear. The  visualized skeletal structures are unremarkable. IMPRESSION: No active disease. Electronically Signed   By: Marijo Conception, M.D.   On: 09/02/2018 19:01   Dg Hip Unilat With Pelvis 2-3 Views Right  Result Date: 09/02/2018 CLINICAL DATA:  62 year old male with fall and right hip pain. EXAM: DG HIP (WITH OR WITHOUT PELVIS) 2-3V RIGHT COMPARISON:  CT of the abdomen pelvis dated 09/02/2018 FINDINGS: There is a nondisplaced mildly impacted fracture of the right femoral neck with foreshortening of the femoral neck. No other acute fracture identified. The bones are osteopenic. There is no dislocation. Mild to moderate bilateral hip arthritic changes. Lower lumbar fusion screws. The soft tissues are grossly unremarkable. Excreted contrast noted within the bladder. IMPRESSION: Nondisplaced right femoral neck fracture.  No dislocation. Electronically Signed   By: Anner Crete M.D.   On: 09/02/2018 22:48    Marzetta Board, MD,  PhD [facility] Pager [phone]  If 7PM-7AM, please contact night-coverage [email or web address] Password [facility] 09/03/2018, 12:43 PM

## 2018-09-03 NOTE — Plan of Care (Signed)
  Problem: Pain Managment: Goal: General experience of comfort will improve Outcome: Progressing   Problem: Safety: Goal: Ability to remain free from injury will improve Outcome: Progressing   

## 2018-09-03 NOTE — Consult Note (Addendum)
NEURO HOSPITALIST  CONSULT   Requesting Physician: Dr. Renne Crigler    Chief Complaint: AMS/ cofnusion  History obtained from:  Patient  / granddaughter  HPI:                                                                                                                                         Matthew Roman is an 62 y.o. male  With PMH of HTN, DM, COPD, cirrhosis who presented to [facility]  On 09/02/18 with c/o AMS and confusion. Neurology consulted for multiple strokes seen on MRI.  Per granddaughter Patient has been disoriented for the past couple of months, but it got acutely worse after a fall on 09/01/18. Patient was unable to speak yesterday afternoon. The things he said did not make sense.  Patient has smoked about 1 pack of cigarettes per day for greater than 10 years. Denies any slurred speech, focal weakness, HA, dizziness, vision problems. No prior stroke history noted. A right hip nondisplaced femoral neck fracture was diagnosed, which is most likely secondary to the fall.  CT Head: age indeterminate lacunar infarct  MRI brain:  1. Multiple subcentimeter infarcts of mixed age scattered throughout the frontal and parietal white matter including a prominent subacute infarct in the right medial frontal lobe and acute infarct in the right posterosuperior frontal lobe. No associated acute hemorrhage or mass effect. 2. Chronic infarct of left lentiform nucleus, thalamus, and cerebral peduncle. Small chronic lacunar infarcts of the thalami. 3. Severe chronic microvascular ischemic changes and moderate volume loss of the brain for age.   Date last known well: Unable to determine Time last known well: Unable to determine tPA Given: No: outside of window Modified Rankin: Rankin Score=1 NIHSS: 4 (difficulty with questions and right leg drift)    Past Medical History:  Diagnosis Date  . Cirrhosis ([place])   . COPD (chronic obstructive pulmonary  disease) ([place])   . Diabetes mellitus without complication ([place])   . Hepatitis   . Hypertension     Past Surgical History:  Procedure Laterality Date  . BACK SURGERY    . KNEE SURGERY    . SHOULDER SURGERY      Family History  Problem Relation Age of Onset  . Osteoporosis Neg Hx          Social History:  reports that he has been smoking. He does not have any smokeless tobacco history on file. He reports that he does not drink alcohol or use drugs.  Allergies: No Known Allergies  Medications:  Scheduled: .  stroke: mapping our early stages of recovery book   Does not apply Once  . pregabalin  150 mg Oral BID   Continuous:  ZOX:[id number], morphine injection   ROS:                                                                                                                                       Does not endorse additional symptoms except for a right temporal headache in the last few days.   General Examination:                                                                                                      Blood pressure 114/71, pulse 77, temperature 98.2 F (36.8 C), temperature source Oral, resp. rate 19, height 6' (1.829 m), weight 86.2 kg, SpO2 96 %.  HEENT-  [place]/AT  Lungs- Respirations unlabored Extremities- Warm and well perfused  Neurological Examination Mental Status:  Alert, oriented, able to state name. Perseverates with the date January [birth year] (his wifes birthday) when asked to orient himself to time. He knows he is in hospital but not which one. He does not know the day, month or year.  Speech fluent with intact comprehension for basic questions and motor commands. No dysarthria. Able to spell WORLD forwards but not backwards. Unable to perform simple math.  Cranial Nerves: II:  Visual fields intact to double simultaneous  stimulation. PERRL.  III,IV, VI: ptosis not present, EOMI.  V,VII: smile symmetric, facial light touch sensation normal bilaterally VIII: hearing intact to voice IX,X: No hypophonia XI: Head at midline XII: midline tongue extension Motor: Right : Upper extremity   5/5  Left:     Upper extremity   5/5  Lower extremity   3/5   Lower extremity   5/5 Normal tone throughout; no atrophy noted Sensory: Pinprick and light touch intact throughout, bilaterally Deep Tendon Reflexes: 2+ and symmetric biceps and patella Plantars: Right: downgoing   Left: downgoing Cerebellar:  normal finger-to-nose,  normal heel-to-shin test on left. Unable to perform on right side d/t hip fracture Gait: deferred   Lab Results: Basic Metabolic Panel: Recent Labs  Lab 09/02/18 1844  NA 139  K 4.0  CL 104  CO2 22  GLUCOSE 188*  BUN 9  CREATININE 0.93  CALCIUM 9.3    CBC: Recent Labs  Lab 09/02/18 1844  WBC 8.6  NEUTROABS 6.8  HGB 16.1  HCT 50.1  MCV 88.2  PLT 90*  Lipid Panel: Recent Labs  Lab 09/03/18 0141  CHOL 165  TRIG 101  HDL 32*  CHOLHDL 5.2  VLDL 20  LDLCALC 113*    CBG: Recent Labs  Lab 09/02/18 1857  GLUCAP 191*    Imaging: Ct Head Wo Contrast  Result Date: 09/02/2018 CLINICAL DATA:  Disoriented EXAM: CT HEAD WITHOUT CONTRAST TECHNIQUE: Contiguous axial images were obtained from the base of the skull through the vertex without intravenous contrast. COMPARISON:  MRI 05/07/2005 FINDINGS: Brain: No acute territorial infarction, hemorrhage or intracranial mass is visualized. Chronic infarct within the left basal ganglia and white matter. Age indeterminate small lacunar infarcts within the thalamus and right basal ganglia. Atrophy with moderate small vessel ischemic changes of the white matter. Stable ventricle size. Vascular: No hyperdense vessels.  Carotid vascular calcification. Skull: Normal. Negative for fracture or focal lesion. Sinuses/Orbits: Mucosal thickening in  the ethmoid and maxillary sinuses. Other: None. IMPRESSION: 1. Small age indeterminate lacunar infarcts within the thalamus and right basal ganglia. 2. 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 Atrophy and small vessel ischemic changes of the white matter. Electronically Signed   By: Donavan Foil M.D.   On: 09/02/2018 21:25   Mr Brain Wo Contrast  Result Date: 09/03/2018 CLINICAL DATA:  62 y/o M; chronic confusion and altered mental status acutely worsened after a fall yesterday. EXAM: MRI HEAD WITHOUT CONTRAST TECHNIQUE: Multiplanar, multiecho pulse sequences of the brain and surrounding structures were obtained without intravenous contrast. COMPARISON:  09/02/2018 CT head.  05/07/2005 MRI head. FINDINGS: Brain: Multiple subcentimeter infarcts of mixed age scattered throughout frontal and parietal white matter including a 9 mm subacute infarct with intermediate diffusion in the right medial frontal lobe (series 5, image 79) and 6 mm acute infarct in the right posterosuperior frontal lobe (series 5 image 84). No associated hemorrhage or mass effect. Chronic hemorrhagic lacunar infarct involving the left lentiform nucleus, medial thalamus, and cerebral peduncle. Additional small chronic lacunar infarcts are present in the bilateral thalami. Patchy nonspecific T2 FLAIR hyperintensities in subcortical and periventricular white matter as well as the pons are compatible with severe chronic microvascular ischemic changes for age. Moderate volume loss of the brain. No abnormal susceptibility hypointensity to indicate acute intracranial hemorrhage. Vascular: Normal flow voids. Skull and upper cervical spine: Normal marrow signal. Sinuses/Orbits: Negative. Other: None. IMPRESSION: 1. Multiple subcentimeter infarcts of mixed age scattered throughout the frontal and parietal white matter including a prominent subacute infarct in the right medial frontal lobe and acute infarct in the right posterosuperior  frontal lobe. No associated acute hemorrhage or mass effect. 2. Chronic infarct of left lentiform nucleus, thalamus, and cerebral peduncle. Small chronic lacunar infarcts of the thalami. 3. Severe chronic microvascular ischemic changes and moderate volume loss of the brain for age. Electronically Signed   By: Kristine Garbe M.D.   On: 09/03/2018 04:37   Ct Abdomen Pelvis W Contrast  Result Date: 09/02/2018 CLINICAL DATA:  Abdominal distension EXAM: CT ABDOMEN AND PELVIS WITH CONTRAST TECHNIQUE: Multidetector CT imaging of the abdomen and pelvis was performed using the standard protocol following bolus administration of intravenous contrast. CONTRAST:  179m OMNIPAQUE IOHEXOL 300 MG/ML  SOLN COMPARISON:  05/21/2011 FINDINGS: Lower chest: Lung bases demonstrate a stable right lower lobe pulmonary nodule. No acute consolidation or effusion. The heart size is normal. Hepatobiliary: Subtle contour nodularity of the liver suggesting cirrhosis. Status post cholecystectomy. No biliary dilatation. Pancreas: Unremarkable. No pancreatic ductal dilatation or surrounding inflammatory changes. Spleen: Slightly enlarged. Adrenals/Urinary Tract:  Adrenal glands are normal. No hydronephrosis. Small stones in the mid right kidney measuring up to 4 mm. Cyst in the lower pole of the left kidney. Bladder is normal. Stomach/Bowel: Stomach nonenlarged. No dilated small bowel. No colon wall thickening. Negative appendix. Vascular/Lymphatic: Moderate aortic atherosclerosis and mural thrombus. No aneurysm. No significantly enlarged lymph nodes. Reproductive: Slightly enlarged prostate Other: No free air or free fluid. Musculoskeletal: Postsurgical changes at L5-S1 with stabilization rods and fixating screws. Acute, slightly impacted appearing right femoral neck fracture. IMPRESSION: 1. No CT evidence for acute intra-abdominal or pelvic abnormality. 2. Subtle contour nodularity of the liver suggesting cirrhosis. The spleen  appears slightly enlarged 3. Findings suspicious for acute mildly impacted right subcapital femoral neck fracture. 4. Nonobstructing stones in the right kidney Electronically Signed   By: Donavan Foil M.D.   On: 09/02/2018 21:19   Dg Chest Port 1 View  Result Date: 09/02/2018 CLINICAL DATA:  Altered mental status. EXAM: PORTABLE CHEST 1 VIEW COMPARISON:  Radiographs of November 03, 2016. FINDINGS: The heart size and mediastinal contours are within normal limits. Both lungs are clear. The visualized skeletal structures are unremarkable. IMPRESSION: No active disease. Electronically Signed   By: Marijo Conception, M.D.   On: 09/02/2018 19:01   Dg Hip Unilat With Pelvis 2-3 Views Right  Result Date: 09/02/2018 CLINICAL DATA:  62 year old male with fall and right hip pain. EXAM: DG HIP (WITH OR WITHOUT PELVIS) 2-3V RIGHT COMPARISON:  CT of the abdomen pelvis dated 09/02/2018 FINDINGS: There is a nondisplaced mildly impacted fracture of the right femoral neck with foreshortening of the femoral neck. No other acute fracture identified. The bones are osteopenic. There is no dislocation. Mild to moderate bilateral hip arthritic changes. Lower lumbar fusion screws. The soft tissues are grossly unremarkable. Excreted contrast noted within the bladder. IMPRESSION: Nondisplaced right femoral neck fracture.  No dislocation. Electronically Signed   By: Anner Crete M.D.   On: 09/02/2018 22:48     Laurey Morale, MSN, NP-C [facility] [phone] 09/03/2018, 9:46 AM   Assessment: 62 y.o. male with multifocal strokes in separate vascular territories ranging in age from acute/subacute to chronic.  1. DDx includes septic emboli, which would account for his confusion, sterile cardioembolic strokes, and temporal arteritis,which would account for his right temporal pain and can present with strokes in the absence of the more classic symptom of vision loss. 2. Neurological examination with findings  consistent with bilateral anterior frontal lobe dysfunction: There is impaired concentration, dyscalculia, impaired orientation and increased latency of verbal responses. No focal weakness is noted.  3. MRI brain: Multiple subcentimeter infarcts of mixed age scattered throughout the frontal and parietal white matter including a prominent subacute infarct in the right medial frontal lobe and acute infarct in the right posterosuperior frontal lobe. No associated acute hemorrhage or mass effect. Also noted are chronic infarcts of the left lentiform nucleus, thalamus, and cerebral peduncle, as well as severe chronic microvascular ischemic changes and moderate volume loss of the brain for age. 4. Stroke Risk Factors - diabetes mellitus, hypertension and smoking   Recommendations: -- The small strokes are unlikely to have a significant hypoperfused penumbra. Hold off on permissive HTN and control BP as per standard protocol.   -- CTA head and neck to assess for atherosclerotic disease or vasculitic lesions -- TTE to assess for possible mural thrombus or valvular vegetation. If negative, obtain TEE. -- Blood cultures x 2 -- ESR and C-reactive protein.  -- Lupus panel --  ASA -- Consider high intensity statin after work up is complete -- HgbA1c, fasting lipid panel -- PT consult, OT consult, Speech consult --Telemetry monitoring -- Frequent neuro checks -- Stroke swallow screen  -- Smoking cessation -- Please page stroke NP  Or  PA  Or MD from 8am -4 pm  as this patient from this time will be  followed by the stroke.   You can look them up on [email or web address]  Password [email or web address]  I have interviewed and examined the patient. I have formulated the assessment and plan. Electronically signed: Dr. Kerney Elbe

## 2018-09-03 NOTE — Consult Note (Addendum)
Reason for Consult:Right hip fx Referring Physician: C Kristopher Roman is an 62 y.o. male.  HPI: Matthew Roman is unsure what happened to him. He has had a gradual mental decline over the last few months and a more dramatic one in the past week. He is unable to contribute much to history though does admit to falling, probably on the day of admission (yesterday). X-rays confirmed a right femoral neck fx and orthopedic surgery was consulted. Plan was to have surgery today by Dr. Griffin Basil but neurologic workup took precedence and surgery was postponed until tomorrow for Dr. Mardelle Matte to complete.  Past Medical History:  Diagnosis Date  . Cirrhosis (Matthew Roman)   . COPD (chronic obstructive pulmonary disease) (Matthew Roman)   . Diabetes mellitus without complication (Matthew Roman)   . Hepatitis   . Hypertension     Past Surgical History:  Procedure Laterality Date  . BACK SURGERY    . KNEE SURGERY    . SHOULDER SURGERY      Family History  Problem Relation Age of Onset  . Osteoporosis Neg Hx     Social History:  reports that he has been smoking. He has never used smokeless tobacco. He reports that he does not drink alcohol or use drugs.  Allergies: No Known Allergies  Medications: I have reviewed the patient's current medications.  Results for orders placed or performed during the hospital encounter of 09/02/18 (from the past 48 hour(s))  Comprehensive metabolic panel     Status: Abnormal   Collection Time: 09/02/18  6:44 PM  Result Value Ref Range   Sodium 139 135 - 145 mmol/L   Potassium 4.0 3.5 - 5.1 mmol/L   Chloride 104 98 - 111 mmol/L   CO2 22 22 - 32 mmol/L   Glucose, Bld 188 (H) 70 - 99 mg/dL   BUN 9 8 - 23 mg/dL   Creatinine, Ser 0.93 0.61 - 1.24 mg/dL   Calcium 9.3 8.9 - 10.3 mg/dL   Total Protein 6.7 6.5 - 8.1 g/dL   Albumin 4.2 3.5 - 5.0 g/dL   AST 23 15 - 41 U/L   ALT 18 0 - 44 U/L   Alkaline Phosphatase 117 38 - 126 U/L   Total Bilirubin 2.2 (H) 0.3 - 1.2 mg/dL   GFR calc non Af Amer  >60 >60 mL/min   GFR calc Af Amer >60 >60 mL/min   Anion gap 13 5 - 15    Comment: Performed at [facility], [address] [place], [place] [address]  CBC WITH DIFFERENTIAL     Status: Abnormal   Collection Time: 09/02/18  6:44 PM  Result Value Ref Range   WBC 8.6 4.0 - 10.5 K/uL   RBC 5.68 4.22 - 5.81 MIL/uL   Hemoglobin 16.1 13.0 - 17.0 g/dL   HCT 50.1 39.0 - 52.0 %   MCV 88.2 80.0 - 100.0 fL   MCH 28.3 26.0 - 34.0 pg   MCHC 32.1 30.0 - 36.0 g/dL   RDW 12.9 11.5 - 15.5 %   Platelets 90 (L) 150 - 400 K/uL    Comment: REPEATED TO VERIFY PLATELET COUNT CONFIRMED BY SMEAR SPECIMEN CHECKED FOR CLOTS Immature Platelet Fraction may be clinically indicated, consider ordering this additional test [id number]    nRBC 0.0 0.0 - 0.2 %   Neutrophils Relative % 80 %   Neutro Abs 6.8 1.7 - 7.7 K/uL   Lymphocytes Relative 10 %   Lymphs Abs 0.9 0.7 - 4.0 K/uL  Monocytes Relative 7 %   Monocytes Absolute 0.6 0.1 - 1.0 K/uL   Eosinophils Relative 3 %   Eosinophils Absolute 0.3 0.0 - 0.5 K/uL   Basophils Relative 0 %   Basophils Absolute 0.0 0.0 - 0.1 K/uL   Immature Granulocytes 0 %   Abs Immature Granulocytes 0.03 0.00 - 0.07 K/uL    Comment: Performed at [facility], [place] [address], [place], [place] [address]  Ammonia     Status: None   Collection Time: 09/02/18  6:45 PM  Result Value Ref Range   Ammonia 27 9 - 35 umol/L    Comment: Performed at [facility], [place] [address] [place], [place] [address]  Ethanol     Status: None   Collection Time: 09/02/18  6:45 PM  Result Value Ref Range   Alcohol, Ethyl (B) <10 <10 mg/dL    Comment: (NOTE) Lowest detectable limit for serum alcohol is 10 mg/dL. For medical purposes only. Performed at [facility], [place] [address] [place], [place] [address]   CBG monitoring, ED     Status: Abnormal   Collection Time: 09/02/18  6:57 PM  Result Value Ref Range   Glucose-Capillary 191 (H) 70 - 99 mg/dL  I-Stat venous  blood gas, ED     Status: Abnormal   Collection Time: 09/02/18  6:59 PM  Result Value Ref Range   pH, Ven 7.504 (H) 7.250 - 7.430   pCO2, Ven 32.3 (L) 44.0 - 60.0 mmHg   pO2, Ven 46.0 (H) 32.0 - 45.0 mmHg   Bicarbonate 25.4 20.0 - 28.0 mmol/L   TCO2 26 22 - 32 mmol/L   O2 Saturation 86.0 %   Acid-Base Excess 3.0 (H) 0.0 - 2.0 mmol/L   Patient temperature HIDE    Sample type VENOUS   I-Stat CG4 Lactic Acid, ED     Status: Abnormal   Collection Time: 09/02/18  6:59 PM  Result Value Ref Range   Lactic Acid, Venous 2.06 (HH) 0.5 - 1.9 mmol/L   Comment NOTIFIED PHYSICIAN   Blood culture (routine x 2)     Status: None (Preliminary result)   Collection Time: 09/02/18  7:38 PM  Result Value Ref Range   Specimen Description BLOOD LEFT ARM    Special Requests      BOTTLES DRAWN AEROBIC AND ANAEROBIC Blood Culture adequate volume   Culture      NO GROWTH < 12 HOURS Performed at [facility], [address], [place], [place] [address]    Report Status PENDING   Blood culture (routine x 2)     Status: None (Preliminary result)   Collection Time: 09/02/18  7:40 PM  Result Value Ref Range   Specimen Description BLOOD LEFT HAND    Special Requests      BOTTLES DRAWN AEROBIC ONLY Blood Culture results may not be optimal due to an excessive volume of blood received in culture bottles   Culture      NO GROWTH < 12 HOURS Performed at [facility] [address], [place], [place] [address]    Report Status PENDING   Urinalysis, Complete w Microscopic     Status: Abnormal   Collection Time: 09/02/18  8:09 PM  Result Value Ref Range   Color, Urine YELLOW YELLOW   APPearance CLEAR CLEAR   Specific Gravity, Urine 1.012 1.005 - 1.030   pH 6.0 5.0 - 8.0   Glucose, UA 50 (A) NEGATIVE mg/dL  Hgb urine dipstick NEGATIVE NEGATIVE   Bilirubin Urine NEGATIVE NEGATIVE   Ketones, ur 5 (A) NEGATIVE mg/dL   Protein, ur NEGATIVE NEGATIVE mg/dL   Nitrite NEGATIVE NEGATIVE   Leukocytes, UA  NEGATIVE NEGATIVE   RBC / HPF 0-5 0 - 5 RBC/hpf   WBC, UA 0-5 0 - 5 WBC/hpf   Bacteria, UA RARE (A) NONE SEEN   Squamous Epithelial / LPF 0-5 0 - 5    Comment: Performed at [facility], [place] [address] [place], [place] [address]  Urine rapid drug screen (hosp performed)     Status: None   Collection Time: 09/02/18  8:09 PM  Result Value Ref Range   Opiates NONE DETECTED NONE DETECTED   Cocaine NONE DETECTED NONE DETECTED   Benzodiazepines NONE DETECTED NONE DETECTED   Amphetamines NONE DETECTED NONE DETECTED   Tetrahydrocannabinol NONE DETECTED NONE DETECTED   Barbiturates NONE DETECTED NONE DETECTED    Comment: (NOTE) DRUG SCREEN FOR MEDICAL PURPOSES ONLY.  IF CONFIRMATION IS NEEDED FOR ANY PURPOSE, NOTIFY LAB WITHIN 5 DAYS. LOWEST DETECTABLE LIMITS FOR URINE DRUG SCREEN Drug Class                     Cutoff (ng/mL) Amphetamine and metabolites    1000 Barbiturate and metabolites    200 Benzodiazepine                 992 Tricyclics and metabolites     300 Opiates and metabolites        300 Cocaine and metabolites        300 THC                            50 Performed at [facility], [place] [address] [place], [place] [address]   MRSA PCR Screening     Status: None   Collection Time: 09/03/18  1:02 AM  Result Value Ref Range   MRSA by PCR NEGATIVE NEGATIVE    Comment:        The GeneXpert MRSA Assay (FDA approved for NASAL specimens only), is one component of a comprehensive MRSA colonization surveillance program. It is not intended to diagnose MRSA infection nor to guide or monitor treatment for MRSA infections. Performed at [facility], [place] [address], [place], [place] [address]   HIV antibody (Routine Testing)     Status: None   Collection Time: 09/03/18  1:41 AM  Result Value Ref Range   HIV Screen 4th Generation wRfx Non Reactive Non Reactive    Comment: (NOTE) Performed At: [facility] [place], [place]  [id number] Rush Farmer MD JH:[id number]   Hemoglobin A1c     Status: None   Collection Time: 09/03/18  1:41 AM  Result Value Ref Range   Hgb A1c MFr Bld 5.3 4.8 - 5.6 %    Comment: (NOTE) Pre diabetes:          5.7%-6.4% Diabetes:              >6.4% Glycemic control for   <7.0% adults with diabetes    Mean Plasma Glucose 105.41 mg/dL    Comment: Performed at [facility] [address] [place], [place] [address]  Lipid panel     Status: Abnormal   Collection Time: 09/03/18  1:41 AM  Result Value Ref Range   Cholesterol 165 0 - 200 mg/dL  Triglycerides 101 <150 mg/dL   HDL 32 (L) >40 mg/dL   Total CHOL/HDL Ratio 5.2 RATIO   VLDL 20 0 - 40 mg/dL   LDL Cholesterol 113 (H) 0 - 99 mg/dL    Comment:        Total Cholesterol/HDL:CHD Risk Coronary Heart Disease Risk Table                     Men   Women  1/2 Average Risk   3.4   3.3  Average Risk       5.0   4.4  2 X Average Risk   9.6   7.1  3 X Average Risk  23.4   11.0        Use the calculated Patient Ratio above and the CHD Risk Table to determine the patient's CHD Risk.        ATP III CLASSIFICATION (LDL):  <100     mg/dL   Optimal  100-129  mg/dL   Near or Above                    Optimal  130-159  mg/dL   Borderline  160-189  mg/dL   High  >190     mg/dL   Very High Performed at [facility] [address], [place], [place] [address]   Protime-INR     Status: None   Collection Time: 09/03/18  8:25 AM  Result Value Ref Range   Prothrombin Time 15.0 11.4 - 15.2 seconds   INR 1.19     Comment: Performed at [facility] [address], [place], [place] [address]    Ct Head Roman Contrast  Result Date: 09/02/2018 CLINICAL DATA:  Disoriented EXAM: CT HEAD WITHOUT CONTRAST TECHNIQUE: Contiguous axial images were obtained from the base of the skull through the vertex without intravenous contrast. COMPARISON:  MRI 05/07/2005 FINDINGS: Brain: No acute territorial infarction, hemorrhage or  intracranial mass is visualized. Chronic infarct within the left basal ganglia and white matter. Age indeterminate small lacunar infarcts within the thalamus and right basal ganglia. Atrophy with moderate small vessel ischemic changes of the white matter. Stable ventricle size. Vascular: No hyperdense vessels.  Carotid vascular calcification. Skull: Normal. Negative for fracture or focal lesion. Sinuses/Orbits: Mucosal thickening in the ethmoid and maxillary sinuses. Other: None. IMPRESSION: 1. Small age indeterminate lacunar infarcts within the thalamus and right basal ganglia. 2. 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 Atrophy and small vessel ischemic changes of the white matter. Electronically Signed   By: Donavan Foil M.D.   On: 09/02/2018 21:25   Matthew Roman Contrast  Result Date: 09/03/2018 CLINICAL DATA:  62 y/o M; chronic confusion and altered mental status acutely worsened after a fall yesterday. EXAM: MRI HEAD WITHOUT CONTRAST TECHNIQUE: Multiplanar, multiecho pulse sequences of the brain and surrounding structures were obtained without intravenous contrast. COMPARISON:  09/02/2018 CT head.  05/07/2005 MRI head. FINDINGS: Brain: Multiple subcentimeter infarcts of mixed age scattered throughout frontal and parietal white matter including a 9 mm subacute infarct with intermediate diffusion in the right medial frontal lobe (series 5, image 79) and 6 mm acute infarct in the right posterosuperior frontal lobe (series 5 image 84). No associated hemorrhage or mass effect. Chronic hemorrhagic lacunar infarct involving the left lentiform nucleus, medial thalamus, and cerebral peduncle. Additional small chronic lacunar infarcts are present in the bilateral thalami. Patchy nonspecific T2 FLAIR hyperintensities in subcortical and periventricular  white matter as well as the pons are compatible with severe chronic microvascular ischemic changes for age. Moderate volume loss of the brain. No abnormal  susceptibility hypointensity to indicate acute intracranial hemorrhage. Vascular: Normal flow voids. Skull and upper cervical spine: Normal marrow signal. Sinuses/Orbits: Negative. Other: None. IMPRESSION: 1. Multiple subcentimeter infarcts of mixed age scattered throughout the frontal and parietal white matter including a prominent subacute infarct in the right medial frontal lobe and acute infarct in the right posterosuperior frontal lobe. No associated acute hemorrhage or mass effect. 2. Chronic infarct of left lentiform nucleus, thalamus, and cerebral peduncle. Small chronic lacunar infarcts of the thalami. 3. Severe chronic microvascular ischemic changes and moderate volume loss of the brain for age. Electronically Signed   By: Kristine Garbe M.D.   On: 09/03/2018 04:37   Ct Abdomen Pelvis W Contrast  Result Date: 09/02/2018 CLINICAL DATA:  Abdominal distension EXAM: CT ABDOMEN AND PELVIS WITH CONTRAST TECHNIQUE: Multidetector CT imaging of the abdomen and pelvis was performed using the standard protocol following bolus administration of intravenous contrast. CONTRAST:  194mL OMNIPAQUE IOHEXOL 300 MG/ML  SOLN COMPARISON:  05/21/2011 FINDINGS: Lower chest: Lung bases demonstrate a stable right lower lobe pulmonary nodule. No acute consolidation or effusion. The heart size is normal. Hepatobiliary: Subtle contour nodularity of the liver suggesting cirrhosis. Status post cholecystectomy. No biliary dilatation. Pancreas: Unremarkable. No pancreatic ductal dilatation or surrounding inflammatory changes. Spleen: Slightly enlarged. Adrenals/Urinary Tract: Adrenal glands are normal. No hydronephrosis. Small stones in the mid right kidney measuring up to 4 mm. Cyst in the lower pole of the left kidney. Bladder is normal. Stomach/Bowel: Stomach nonenlarged. No dilated small bowel. No colon wall thickening. Negative appendix. Vascular/Lymphatic: Moderate aortic atherosclerosis and mural thrombus. No  aneurysm. No significantly enlarged lymph nodes. Reproductive: Slightly enlarged prostate Other: No free air or free fluid. Musculoskeletal: Postsurgical changes at L5-S1 with stabilization rods and fixating screws. Acute, slightly impacted appearing right femoral neck fracture. IMPRESSION: 1. No CT evidence for acute intra-abdominal or pelvic abnormality. 2. Subtle contour nodularity of the liver suggesting cirrhosis. The spleen appears slightly enlarged 3. Findings suspicious for acute mildly impacted right subcapital femoral neck fracture. 4. Nonobstructing stones in the right kidney Electronically Signed   By: Donavan Foil M.D.   On: 09/02/2018 21:19   Dg Chest Port 1 View  Result Date: 09/02/2018 CLINICAL DATA:  Altered mental status. EXAM: PORTABLE CHEST 1 VIEW COMPARISON:  Radiographs of November 03, 2016. FINDINGS: The heart size and mediastinal contours are within normal limits. Both lungs are clear. The visualized skeletal structures are unremarkable. IMPRESSION: No active disease. Electronically Signed   By: Marijo Conception, M.D.   On: 09/02/2018 19:01   Dg Hip Unilat With Pelvis 2-3 Views Right  Result Date: 09/02/2018 CLINICAL DATA:  62 year old male with fall and right hip pain. EXAM: DG HIP (WITH OR WITHOUT PELVIS) 2-3V RIGHT COMPARISON:  CT of the abdomen pelvis dated 09/02/2018 FINDINGS: There is a nondisplaced mildly impacted fracture of the right femoral neck with foreshortening of the femoral neck. No other acute fracture identified. The bones are osteopenic. There is no dislocation. Mild to moderate bilateral hip arthritic changes. Lower lumbar fusion screws. The soft tissues are grossly unremarkable. Excreted contrast noted within the bladder. IMPRESSION: Nondisplaced right femoral neck fracture.  No dislocation. Electronically Signed   By: Anner Crete M.D.   On: 09/02/2018 22:48   Vas US Carotid (at [facility] Only)  Result Date: 09/03/2018 Carotid Arterial  Duplex Study  Indications: Lacunar infarction. Performing Technologist: Oliver Hum RVT  Examination Guidelines: A complete evaluation includes B-mode imaging, spectral Doppler, color Doppler, and power Doppler as needed of all accessible portions of each vessel. Bilateral testing is considered an integral part of a complete examination. Limited examinations for reoccurring indications may be performed as noted.  Right Carotid Findings: +----------+--------+-------+--------+--------------------------------+--------+           PSV cm/sEDV    StenosisDescribe                        Comments                   cm/s                                                    +----------+--------+-------+--------+--------------------------------+--------+ CCA Prox  91      12             smooth and heterogenous                  +----------+--------+-------+--------+--------------------------------+--------+ CCA Distal70      18             smooth and heterogenous                  +----------+--------+-------+--------+--------------------------------+--------+ ICA Prox  38      10             smooth, heterogenous and                                                  calcific                                 +----------+--------+-------+--------+--------------------------------+--------+ ICA Distal73      15                                             tortuous +----------+--------+-------+--------+--------------------------------+--------+ ECA       99      13                                                      +----------+--------+-------+--------+--------------------------------+--------+ +----------+--------+-------+--------+-------------------+           PSV cm/sEDV cmsDescribeArm Pressure (mmHG) +----------+--------+-------+--------+-------------------+ [id number]                                        +----------+--------+-------+--------+-------------------+  +---------+--------+--+--------+--+---------+ VertebralPSV cm/s44EDV cm/s14Antegrade +---------+--------+--+--------+--+---------+  Left Carotid Findings: +----------+--------+--------+--------+-----------------------+--------+           PSV cm/sEDV cm/sStenosisDescribe               Comments +----------+--------+--------+--------+-----------------------+--------+ CCA Prox  110     11  smooth and heterogenous         +----------+--------+--------+--------+-----------------------+--------+ CCA Distal73      15              smooth and heterogenous         +----------+--------+--------+--------+-----------------------+--------+ ICA Prox  53      12              smooth and heterogenous         +----------+--------+--------+--------+-----------------------+--------+ ICA Distal66      14                                     tortuous +----------+--------+--------+--------+-----------------------+--------+ ECA       88      16                                              +----------+--------+--------+--------+-----------------------+--------+ +----------+--------+--------+--------+-------------------+ SubclavianPSV cm/sEDV cm/sDescribeArm Pressure (mmHG) +----------+--------+--------+--------+-------------------+           118                                         +----------+--------+--------+--------+-------------------+ +---------+--------+--+--------+-+---------+ VertebralPSV cm/s48EDV cm/s7Antegrade +---------+--------+--+--------+-+---------+  Summary: Right Carotid: Velocities in the right ICA are consistent with a 1-39% stenosis. Left Carotid: Velocities in the left ICA are consistent with a 1-39% stenosis. Vertebrals: Bilateral vertebral arteries demonstrate antegrade flow. *See table(s) above for measurements and observations.  Electronically signed by Deitra Mayo MD on 09/03/2018 at 1:50:48 PM.    Final     Review of  Systems  Constitutional: Negative for weight loss.  HENT: Negative for ear discharge, ear pain, hearing loss and tinnitus.   Eyes: Negative for blurred vision, double vision, photophobia and pain.  Respiratory: Negative for cough, sputum production and shortness of breath.   Cardiovascular: Negative for chest pain.  Gastrointestinal: Negative for abdominal pain, nausea and vomiting.  Genitourinary: Negative for dysuria, flank pain, frequency and urgency.  Musculoskeletal: Positive for joint pain (Right hip). Negative for back pain, falls, myalgias and neck pain.  Neurological: Negative for dizziness, tingling, sensory change, focal weakness, loss of consciousness and headaches.  Endo/Heme/Allergies: Does not bruise/bleed easily.  Psychiatric/Behavioral: Negative for depression, memory loss and substance abuse. The patient is not nervous/anxious.    Blood pressure (!) 175/87, pulse 83, temperature 98.2 F (36.8 Matthew), temperature source Oral, resp. rate 19, height 6' (1.829 m), weight 86.2 kg, SpO2 95 %. Physical Exam  Constitutional: He appears well-developed and well-nourished. No distress.  HENT:  Head: Normocephalic and atraumatic.  Eyes: Conjunctivae are normal. Right eye exhibits no discharge. Left eye exhibits no discharge. No scleral icterus.  Neck: Normal range of motion.  Cardiovascular: Normal rate and regular rhythm.  Respiratory: Effort normal. No respiratory distress.  Musculoskeletal:     Comments: RLE No traumatic wounds, ecchymosis, or rash  TTP hip  No knee or ankle effusion  Knee stable to varus/ valgus and anterior/posterior stress  Sens DPN, SPN, TN intact  Motor EHL, ext, flex, evers 5/5  DP 2+, PT 2+, No significant edema  Neurological: He is alert.  Skin: Skin is warm and dry. He is not diaphoretic.  Psychiatric: He has a normal mood and affect.  His behavior is normal.    Assessment/Plan: Right femoral neck fx -- Plan cannulated hip pinning by Dr. Mardelle Matte  tomorrow. Please keep NPO after MN. Multiple medical problems including COPD, cirrhosis, HTN, and AMS -- per primary team    Lisette Abu, PA-[facility] [phone] 09/03/2018, 3:18 PM   Discussed and agree with above.  Plan for hip pinning.

## 2018-09-04 ENCOUNTER — Inpatient Hospital Stay (HOSPITAL_COMMUNITY): Payer: Medicare Other | Admitting: Anesthesiology

## 2018-09-04 ENCOUNTER — Inpatient Hospital Stay (HOSPITAL_COMMUNITY): Payer: Medicare Other

## 2018-09-04 ENCOUNTER — Encounter (HOSPITAL_COMMUNITY)
Admission: EM | Disposition: A | Discharge status: Skilled Nursing Facility | Payer: Self-pay | Source: Home / Self Care | Attending: Internal Medicine

## 2018-09-04 ENCOUNTER — Other Ambulatory Visit: Payer: Self-pay

## 2018-09-04 ENCOUNTER — Encounter (HOSPITAL_COMMUNITY): Payer: Self-pay | Admitting: Radiology

## 2018-09-04 DIAGNOSIS — I10 Essential (primary) hypertension: Secondary | ICD-10-CM

## 2018-09-04 DIAGNOSIS — I63411 Cerebral infarction due to embolism of right middle cerebral artery: Secondary | ICD-10-CM

## 2018-09-04 DIAGNOSIS — E785 Hyperlipidemia, unspecified: Secondary | ICD-10-CM

## 2018-09-04 HISTORY — PX: HIP PINNING,CANNULATED: SHX1758

## 2018-09-04 LAB — BASIC METABOLIC PANEL
Anion gap: 12 (ref 5–15)
BUN: 11 mg/dL (ref 8–23)
CO2: 24 mmol/L (ref 22–32)
Calcium: 9.1 mg/dL (ref 8.9–10.3)
Chloride: 106 mmol/L (ref 98–111)
Creatinine, Ser: 1 mg/dL (ref 0.61–1.24)
GFR calc Af Amer: >60 mL/min (ref >60)
GFR calc non Af Amer: >60 mL/min (ref >60)
GLUCOSE: 112 mg/dL — AB (ref 70–99)
Potassium: 3.5 mmol/L (ref 3.5–5.1)
Sodium: 142 mmol/L (ref 135–145)

## 2018-09-04 LAB — CBC
HCT: 48 % (ref 39.0–52.0)
Hemoglobin: 15.7 g/dL (ref 13.0–17.0)
MCH: 28.8 pg (ref 26.0–34.0)
MCHC: 32.7 g/dL (ref 30.0–36.0)
MCV: 88.1 fL (ref 80.0–100.0)
Platelets: 78 10*3/uL — ABNORMAL LOW (ref 150–400)
RBC: 5.45 MIL/uL (ref 4.22–5.81)
RDW: 13 % (ref 11.5–15.5)
WBC: 7 10*3/uL (ref 4.0–10.5)
nRBC: 0 % (ref 0.0–0.2)

## 2018-09-04 LAB — GLUCOSE, CAPILLARY
Glucose-Capillary: 128 mg/dL — ABNORMAL HIGH (ref 70–99)
Glucose-Capillary: 135 mg/dL — ABNORMAL HIGH (ref 70–99)

## 2018-09-04 LAB — HEMOGLOBIN A1C
Hgb A1c MFr Bld: 5.3 % (ref 4.8–5.6)
Mean Plasma Glucose: 105.41 mg/dL

## 2018-09-04 LAB — LIPID PANEL
CHOLESTEROL: 179 mg/dL (ref 0–200)
HDL: 30 mg/dL — ABNORMAL LOW (ref >40)
LDL Cholesterol: 128 mg/dL — ABNORMAL HIGH (ref 0–99)
TRIGLYCERIDES: 106 mg/dL (ref <150)
Total CHOL/HDL Ratio: 6 RATIO
VLDL: 21 mg/dL (ref 0–40)

## 2018-09-04 SURGERY — FIXATION, FEMUR, NECK, PERCUTANEOUS, USING SCREW
Anesthesia: General | Site: Hip | Laterality: Right

## 2018-09-04 SURGERY — FIXATION, FEMUR, NECK, PERCUTANEOUS, USING SCREW
Anesthesia: General | Laterality: Right

## 2018-09-04 MED ORDER — EPHEDRINE 5 MG/ML INJ
INTRAVENOUS | Status: AC
  Filled 2018-09-04: qty 10

## 2018-09-04 MED ORDER — FENTANYL CITRATE (PF) 250 MCG/5ML IJ SOLN
INTRAMUSCULAR | Status: DC | PRN
  Administered 2018-09-04: 75 ug via INTRAVENOUS
  Administered 2018-09-04: 150 ug via INTRAVENOUS
  Administered 2018-09-04 (×3): 50 ug via INTRAVENOUS
  Administered 2018-09-04: 25 ug via INTRAVENOUS

## 2018-09-04 MED ORDER — PHENYLEPHRINE 40 MCG/ML (10ML) SYRINGE FOR IV PUSH (FOR BLOOD PRESSURE SUPPORT)
PREFILLED_SYRINGE | INTRAVENOUS | Status: DC | PRN
  Administered 2018-09-04: 80 ug via INTRAVENOUS

## 2018-09-04 MED ORDER — ENOXAPARIN SODIUM 40 MG/0.4ML ~~LOC~~ SOLN
40.0000 mg | SUBCUTANEOUS | Status: DC
  Administered 2018-09-05 – 2018-09-07 (×3): 40 mg via SUBCUTANEOUS
  Filled 2018-09-04 (×3): qty 0.4

## 2018-09-04 MED ORDER — LABETALOL HCL 5 MG/ML IV SOLN
5.0000 mg | INTRAVENOUS | Status: DC | PRN
  Administered 2018-09-04 (×2): 5 mg via INTRAVENOUS

## 2018-09-04 MED ORDER — LACTATED RINGERS IV SOLN: INTRAVENOUS | Status: DC

## 2018-09-04 MED ORDER — MAGNESIUM CITRATE PO SOLN: 1.0000 | Freq: Once | ORAL | Status: DC | PRN

## 2018-09-04 MED ORDER — ALUM & MAG HYDROXIDE-SIMETH 200-200-20 MG/5ML PO SUSP: 30.0000 mL | ORAL | Status: DC | PRN

## 2018-09-04 MED ORDER — FENTANYL CITRATE (PF) 250 MCG/5ML IJ SOLN
INTRAMUSCULAR | Status: AC
  Filled 2018-09-04: qty 5

## 2018-09-04 MED ORDER — LABETALOL HCL 5 MG/ML IV SOLN
INTRAVENOUS | Status: AC
  Administered 2018-09-04: 5 mg via INTRAVENOUS
  Filled 2018-09-04: qty 4

## 2018-09-04 MED ORDER — ROCURONIUM BROMIDE 10 MG/ML (PF) SYRINGE
PREFILLED_SYRINGE | INTRAVENOUS | Status: DC | PRN
  Administered 2018-09-04: 50 mg via INTRAVENOUS

## 2018-09-04 MED ORDER — ESMOLOL HCL 100 MG/10ML IV SOLN
INTRAVENOUS | Status: AC
  Filled 2018-09-04: qty 10

## 2018-09-04 MED ORDER — ACETAMINOPHEN 325 MG PO TABS
325.0000 mg | ORAL_TABLET | Freq: Four times a day (QID) | ORAL | Status: DC | PRN
  Filled 2018-09-04: qty 2

## 2018-09-04 MED ORDER — IOPAMIDOL (ISOVUE-370) INJECTION 76%
50.0000 mL | Freq: Once | INTRAVENOUS | Status: AC | PRN
  Administered 2018-09-04: 50 mL via INTRAVENOUS

## 2018-09-04 MED ORDER — ACETAMINOPHEN 500 MG PO TABS
500.0000 mg | ORAL_TABLET | Freq: Four times a day (QID) | ORAL | Status: AC
  Administered 2018-09-04 – 2018-09-05 (×3): 500 mg via ORAL
  Filled 2018-09-04 (×4): qty 1

## 2018-09-04 MED ORDER — MENTHOL 3 MG MT LOZG: 1.0000 | LOZENGE | OROMUCOSAL | Status: DC | PRN

## 2018-09-04 MED ORDER — ONDANSETRON HCL 4 MG PO TABS: 4.0000 mg | ORAL_TABLET | Freq: Four times a day (QID) | ORAL | Status: DC | PRN

## 2018-09-04 MED ORDER — MIDAZOLAM HCL 2 MG/2ML IJ SOLN
INTRAMUSCULAR | Status: AC
  Filled 2018-09-04: qty 2

## 2018-09-04 MED ORDER — POVIDONE-IODINE 10 % EX SWAB: 2.0000 "application " | Freq: Once | CUTANEOUS | Status: DC

## 2018-09-04 MED ORDER — KETOROLAC TROMETHAMINE 15 MG/ML IJ SOLN
7.5000 mg | Freq: Four times a day (QID) | INTRAMUSCULAR | Status: AC
  Administered 2018-09-04 – 2018-09-05 (×4): 7.5 mg via INTRAVENOUS
  Filled 2018-09-04 (×4): qty 1

## 2018-09-04 MED ORDER — LIDOCAINE 2% (20 MG/ML) 5 ML SYRINGE
INTRAMUSCULAR | Status: AC
  Filled 2018-09-04: qty 5

## 2018-09-04 MED ORDER — ONDANSETRON HCL 4 MG/2ML IJ SOLN
INTRAMUSCULAR | Status: DC | PRN
  Administered 2018-09-04: 4 mg via INTRAVENOUS

## 2018-09-04 MED ORDER — EPHEDRINE SULFATE-NACL 50-0.9 MG/10ML-% IV SOSY
PREFILLED_SYRINGE | INTRAVENOUS | Status: DC | PRN
  Administered 2018-09-04: 10 mg via INTRAVENOUS
  Administered 2018-09-04 (×2): 5 mg via INTRAVENOUS

## 2018-09-04 MED ORDER — MIDAZOLAM HCL 2 MG/2ML IJ SOLN: 0.5000 mg | Freq: Once | INTRAMUSCULAR | Status: DC | PRN

## 2018-09-04 MED ORDER — POTASSIUM CHLORIDE IN NACL 20-0.9 MEQ/L-% IV SOLN
INTRAVENOUS | Status: DC
  Administered 2018-09-04: 20:00:00 via INTRAVENOUS
  Filled 2018-09-04: qty 1000

## 2018-09-04 MED ORDER — FENTANYL CITRATE (PF) 100 MCG/2ML IJ SOLN
25.0000 ug | INTRAMUSCULAR | Status: DC | PRN
  Administered 2018-09-04 (×2): 50 ug via INTRAVENOUS

## 2018-09-04 MED ORDER — METOCLOPRAMIDE HCL 5 MG/ML IJ SOLN: 5.0000 mg | Freq: Three times a day (TID) | INTRAMUSCULAR | Status: DC | PRN

## 2018-09-04 MED ORDER — DEXAMETHASONE SODIUM PHOSPHATE 10 MG/ML IJ SOLN
INTRAMUSCULAR | Status: DC | PRN
  Administered 2018-09-04: 5 mg via INTRAVENOUS

## 2018-09-04 MED ORDER — KETOROLAC TROMETHAMINE 15 MG/ML IJ SOLN
INTRAMUSCULAR | Status: AC
  Filled 2018-09-04: qty 1

## 2018-09-04 MED ORDER — FERROUS SULFATE 325 (65 FE) MG PO TABS
325.0000 mg | ORAL_TABLET | Freq: Three times a day (TID) | ORAL | Status: DC
  Administered 2018-09-04 – 2018-09-07 (×8): 325 mg via ORAL
  Filled 2018-09-04 (×8): qty 1

## 2018-09-04 MED ORDER — ROCURONIUM BROMIDE 50 MG/5ML IV SOSY
PREFILLED_SYRINGE | INTRAVENOUS | Status: AC
  Filled 2018-09-04: qty 5

## 2018-09-04 MED ORDER — LACTATED RINGERS IV SOLN
INTRAVENOUS | Status: DC | PRN
  Administered 2018-09-04 (×2): via INTRAVENOUS

## 2018-09-04 MED ORDER — DOCUSATE SODIUM 100 MG PO CAPS
100.0000 mg | ORAL_CAPSULE | Freq: Two times a day (BID) | ORAL | Status: DC
  Administered 2018-09-04 – 2018-09-07 (×4): 100 mg via ORAL
  Filled 2018-09-04 (×5): qty 1

## 2018-09-04 MED ORDER — BUPIVACAINE HCL (PF) 0.25 % IJ SOLN
INTRAMUSCULAR | Status: AC
  Filled 2018-09-04: qty 30

## 2018-09-04 MED ORDER — PHENOL 1.4 % MT LIQD: 1.0000 | OROMUCOSAL | Status: DC | PRN

## 2018-09-04 MED ORDER — 0.9 % SODIUM CHLORIDE (POUR BTL) OPTIME
TOPICAL | Status: DC | PRN
  Administered 2018-09-04: 1000 mL

## 2018-09-04 MED ORDER — BUPIVACAINE HCL 0.25 % IJ SOLN
INTRAMUSCULAR | Status: DC | PRN
  Administered 2018-09-04: 30 mL

## 2018-09-04 MED ORDER — CHLORHEXIDINE GLUCONATE 4 % EX LIQD
60.0000 mL | Freq: Once | CUTANEOUS | Status: AC
  Administered 2018-09-04: 4 via TOPICAL

## 2018-09-04 MED ORDER — MEPERIDINE HCL 50 MG/ML IJ SOLN: 6.2500 mg | INTRAMUSCULAR | Status: DC | PRN

## 2018-09-04 MED ORDER — POLYETHYLENE GLYCOL 3350 17 G PO PACK: 17.0000 g | PACK | Freq: Every day | ORAL | Status: DC | PRN

## 2018-09-04 MED ORDER — SUGAMMADEX SODIUM 200 MG/2ML IV SOLN
INTRAVENOUS | Status: DC | PRN
  Administered 2018-09-04: 150 mg via INTRAVENOUS

## 2018-09-04 MED ORDER — LIDOCAINE 2% (20 MG/ML) 5 ML SYRINGE
INTRAMUSCULAR | Status: DC | PRN
  Administered 2018-09-04: 60 mg via INTRAVENOUS

## 2018-09-04 MED ORDER — METOCLOPRAMIDE HCL 5 MG PO TABS: 5.0000 mg | ORAL_TABLET | Freq: Three times a day (TID) | ORAL | Status: DC | PRN

## 2018-09-04 MED ORDER — CEFAZOLIN SODIUM-DEXTROSE 2-4 GM/100ML-% IV SOLN
2.0000 g | Freq: Four times a day (QID) | INTRAVENOUS | Status: AC
  Administered 2018-09-04 – 2018-09-05 (×2): 2 g via INTRAVENOUS
  Filled 2018-09-04 (×2): qty 100

## 2018-09-04 MED ORDER — PROPOFOL 10 MG/ML IV BOLUS
INTRAVENOUS | Status: DC | PRN
  Administered 2018-09-04: 200 mg via INTRAVENOUS

## 2018-09-04 MED ORDER — BISACODYL 10 MG RE SUPP: 10.0000 mg | Freq: Every day | RECTAL | Status: DC | PRN

## 2018-09-04 MED ORDER — PROMETHAZINE HCL 25 MG/ML IJ SOLN: 6.2500 mg | INTRAMUSCULAR | Status: DC | PRN

## 2018-09-04 MED ORDER — HYDROCODONE-ACETAMINOPHEN 5-325 MG PO TABS
1.0000 | ORAL_TABLET | ORAL | Status: DC | PRN
  Administered 2018-09-05 – 2018-09-07 (×6): 2 via ORAL
  Filled 2018-09-04 (×6): qty 2

## 2018-09-04 MED ORDER — CEFAZOLIN SODIUM-DEXTROSE 2-4 GM/100ML-% IV SOLN
2.0000 g | INTRAVENOUS | Status: AC
  Administered 2018-09-04: 2 g via INTRAVENOUS
  Filled 2018-09-04: qty 100

## 2018-09-04 MED ORDER — MORPHINE SULFATE (PF) 2 MG/ML IV SOLN: 0.5000 mg | INTRAVENOUS | Status: DC | PRN

## 2018-09-04 MED ORDER — ONDANSETRON HCL 4 MG/2ML IJ SOLN
4.0000 mg | Freq: Four times a day (QID) | INTRAMUSCULAR | Status: DC | PRN
  Administered 2018-09-04: 4 mg via INTRAVENOUS
  Filled 2018-09-04: qty 2

## 2018-09-04 MED ORDER — PROPOFOL 10 MG/ML IV BOLUS
INTRAVENOUS | Status: AC
  Filled 2018-09-04: qty 20

## 2018-09-04 MED ORDER — HYDROCODONE-ACETAMINOPHEN 7.5-325 MG PO TABS
1.0000 | ORAL_TABLET | ORAL | Status: DC | PRN
  Administered 2018-09-05 – 2018-09-06 (×3): 2 via ORAL
  Filled 2018-09-04 (×3): qty 2

## 2018-09-04 MED ORDER — FENTANYL CITRATE (PF) 100 MCG/2ML IJ SOLN
INTRAMUSCULAR | Status: AC
  Administered 2018-09-04: 50 ug via INTRAVENOUS
  Filled 2018-09-04: qty 2

## 2018-09-04 MED ORDER — PHENYLEPHRINE 40 MCG/ML (10ML) SYRINGE FOR IV PUSH (FOR BLOOD PRESSURE SUPPORT)
PREFILLED_SYRINGE | INTRAVENOUS | Status: AC
  Filled 2018-09-04: qty 10

## 2018-09-04 SURGICAL SUPPLY — 44 items
APL SKNCLS STERI-STRIP NONHPOA (GAUZE/BANDAGES/DRESSINGS)
BENZOIN TINCTURE PRP APPL 2/3 (GAUZE/BANDAGES/DRESSINGS) IMPLANT
BIT DRILL CANNULATED 5.0 (BIT) ×2 IMPLANT
BNDG COHESIVE 6X5 TAN STRL LF (GAUZE/BANDAGES/DRESSINGS) ×4 IMPLANT
BOOTCOVER CLEANROOM LRG (PROTECTIVE WEAR) ×6 IMPLANT
CLOSURE STERI-STRIP 1/2X4 (GAUZE/BANDAGES/DRESSINGS)
CLSR STERI-STRIP ANTIMIC 1/2X4 (GAUZE/BANDAGES/DRESSINGS) IMPLANT
COVER PERINEAL POST (MISCELLANEOUS) ×3 IMPLANT
COVER SURGICAL LIGHT HANDLE (MISCELLANEOUS) ×3 IMPLANT
COVER WAND RF STERILE (DRAPES) ×3 IMPLANT
DRAPE STERI IOBAN 125X83 (DRAPES) ×3 IMPLANT
DRSG MEPILEX BORDER 4X4 (GAUZE/BANDAGES/DRESSINGS) ×3 IMPLANT
DRSG PAD ABDOMINAL 8X10 ST (GAUZE/BANDAGES/DRESSINGS) IMPLANT
DURAPREP 26ML APPLICATOR (WOUND CARE) ×3 IMPLANT
ELECT REM PT RETURN 9FT ADLT (ELECTROSURGICAL) ×3
ELECTRODE REM PT RTRN 9FT ADLT (ELECTROSURGICAL) ×1 IMPLANT
GLOVE BIOGEL PI ORTHO PRO SZ8 (GLOVE) ×4
GLOVE ORTHO TXT STRL SZ7.5 (GLOVE) ×3 IMPLANT
GLOVE PI ORTHO PRO STRL SZ8 (GLOVE) ×2 IMPLANT
GLOVE SURG ORTHO 8.0 STRL STRW (GLOVE) ×6 IMPLANT
GOWN STRL REUS W/ TWL XL LVL3 (GOWN DISPOSABLE) ×1 IMPLANT
GOWN STRL REUS W/TWL 2XL LVL3 (GOWN DISPOSABLE) IMPLANT
GOWN STRL REUS W/TWL XL LVL3 (GOWN DISPOSABLE) ×3
KIT BASIN OR (CUSTOM PROCEDURE TRAY) ×3 IMPLANT
KIT TURNOVER KIT B (KITS) ×3 IMPLANT
LINER BOOT UNIVERSAL DISP (MISCELLANEOUS) ×3 IMPLANT
MANIFOLD NEPTUNE II (INSTRUMENTS) ×3 IMPLANT
NEEDLE 22X1 1/2 (OR ONLY) (NEEDLE) ×3 IMPLANT
NS IRRIG 1000ML POUR BTL (IV SOLUTION) ×3 IMPLANT
PACK GENERAL/GYN (CUSTOM PROCEDURE TRAY) ×3 IMPLANT
PAD ARMBOARD 7.5X6 YLW CONV (MISCELLANEOUS) ×6 IMPLANT
PIN THD TIP GUIDE 3.2X12 (PIN) ×6 IMPLANT
SCREW CANN 8.2X7.5X95MM (Screw) ×4 IMPLANT
SCREW CANNULATED 7.5X105 (Screw) ×2 IMPLANT
SUT VIC AB 2-0 FS1 27 (SUTURE) ×3 IMPLANT
SUT VIC AB 2-0 SH 27 (SUTURE)
SUT VIC AB 2-0 SH 27XBRD (SUTURE) IMPLANT
SUT VIC AB 3-0 SH 27 (SUTURE)
SUT VIC AB 3-0 SH 27X BRD (SUTURE) IMPLANT
SYR CONTROL 10ML LL (SYRINGE) ×3 IMPLANT
TAPE STRIPS DRAPE STRL (GAUZE/BANDAGES/DRESSINGS) ×2 IMPLANT
TOWEL OR 17X24 6PK STRL BLUE (TOWEL DISPOSABLE) ×3 IMPLANT
TOWEL OR 17X26 10 PK STRL BLUE (TOWEL DISPOSABLE) ×3 IMPLANT
WATER STERILE IRR 1000ML POUR (IV SOLUTION) ×3 IMPLANT

## 2018-09-04 NOTE — Op Note (Signed)
09/02/2018 - 09/04/2018  4:29 PM  PATIENT:  Matthew Roman    PRE-OPERATIVE DIAGNOSIS:  RIGHT non displaced femoral neck fracture  POST-OPERATIVE DIAGNOSIS:  Same  PROCEDURE:  CANNULATED HIP PINNING  SURGEON:  Johnny Bridge, MD  PHYSICIAN ASSISTANT: Joya Gaskins, OPA-C, present and scrubbed throughout the case, critical for completion in a timely fashion, and for retraction, instrumentation, and closure.  ANESTHESIA:   General  ESTIMATED BLOOD LOSS: Minimal.  PREOPERATIVE INDICATIONS:  Matthew Roman is a  62 y.o. male who fell and was found to have a diagnosis of non displaced femoral neck fracture who elected for surgical management.  He also had what looked like a preoperative stroke, and was optimized and then went to surgery.  The risks benefits and alternatives were discussed with the patient preoperatively including but not limited to the risks of infection, bleeding, nerve injury, cardiopulmonary complications, blood clots, malunion, nonunion, avascular necrosis, the need for revision surgery, the potential for conversion to hemiarthroplasty, among others, and the patient was willing to proceed.  OPERATIVE IMPLANTS: 7.5 mm cannulated screws x3, stainless steel from [employer]  OPERATIVE FINDINGS: Bone quality was not terrible, I had excellent fixation and purchase.  OPERATIVE PROCEDURE: The patient was brought to the operating room and placed in supine position. IV antibiotics were given. General anesthesia administered. The patient was placed on the fracture table. The operative extremity was positioned, without any significant reduction maneuver and was prepped and draped in usual sterile fashion.  Time out was performed.  Small incision was made distal to the greater trochanter, and 3 guidewires were introduced Into an inverted triangle configuration. The lengths were measured. The reduction was slightly valgus, and near-anatomic. I opened the cortex with a cannulated drill,  and then placed the screws into position. Satisfactory fixation was achieved.  The wounds were irrigated copiously, and repaired with Vicryl with Steri-Strips and sterile gauze. There no complications and the patient tolerated the procedure well.  The patient will be weightbearing as tolerated, and will be on anticoagulation for 4 weeks after discharge for DVT prophylaxis.

## 2018-09-04 NOTE — Progress Notes (Signed)
Pt cleansed with chlorhexidine cloths, x6 per MD orders. Pt alert, disoriented only to time and date. Taken to OR

## 2018-09-04 NOTE — Progress Notes (Signed)
STROKE TEAM PROGRESS NOTE   SUBJECTIVE (INTERVAL HISTORY) His son is at the bedside.  Son stated that pt had some memory issue before the mechanical fall two days ago but after the fall, pt seems to have difficulty with speech and more confusion with left hand numbness. Today pt just complains of right hand clumsy and difficulty put words together. Scheduled right hip surgery this afternoon. CTA head and neck unremarkable.    OBJECTIVE Vitals:   09/03/18 1336 09/03/18 1616 09/03/18 1926 09/04/18 0612  BP: (!) 175/87 (!) 156/91 (!) 144/78 (!) 168/88  Pulse:  74 61 73  Resp:      Temp:   98.7 F (37.1 C) 98.6 F (37 C)  TempSrc:   Oral Oral  SpO2:  94% 95% 93%  Weight:      Height:        CBC:  Recent Labs  Lab 09/02/18 1844 09/04/18 0245  WBC 8.6 7.0  NEUTROABS 6.8  --   HGB 16.1 15.7  HCT 50.1 48.0  MCV 88.2 88.1  PLT 90* 78*    Basic Metabolic Panel:  Recent Labs  Lab 09/02/18 1844 09/04/18 0245  NA 139 142  K 4.0 3.5  CL 104 106  CO2 22 24  GLUCOSE 188* 112*  BUN 9 11  CREATININE 0.93 1.00  CALCIUM 9.3 9.1    Lipid Panel:     Component Value Date/Time   CHOL 179 09/04/2018 0245   TRIG 106 09/04/2018 0245   HDL 30 (L) 09/04/2018 0245   CHOLHDL 6.0 09/04/2018 0245   VLDL 21 09/04/2018 0245   LDLCALC 128 (H) 09/04/2018 0245   HgbA1c:  Lab Results  Component Value Date   HGBA1C 5.3 09/04/2018   Urine Drug Screen:     Component Value Date/Time   LABOPIA NONE DETECTED 09/02/2018 2009   COCAINSCRNUR NONE DETECTED 09/02/2018 2009   LABBENZ NONE DETECTED 09/02/2018 2009   AMPHETMU NONE DETECTED 09/02/2018 2009   THCU NONE DETECTED 09/02/2018 2009   LABBARB NONE DETECTED 09/02/2018 2009    Alcohol Level     Component Value Date/Time   ETH <10 09/02/2018 1845    IMAGING  Ct Head Wo Contrast 09/02/2018 IMPRESSION:  1. Small age indeterminate lacunar infarcts within the thalamus and right basal ganglia.  2. Chronic left white matter and basal  ganglial infarct. Atrophy and small vessel ischemic changes of the white matter.    Mr Brain Wo Contrast 09/03/2018 IMPRESSION:  1. Multiple subcentimeter infarcts of mixed age scattered throughout the frontal and parietal white matter including a prominent subacute infarct in the right medial frontal lobe and acute infarct in the right posterosuperior frontal lobe. No associated acute hemorrhage or mass effect.  2. Chronic infarct of left lentiform nucleus, thalamus, and cerebral peduncle. Small chronic lacunar infarcts of the thalami.  3. Severe chronic microvascular ischemic changes and moderate volume loss of the brain for age.    Ct Abdomen Pelvis W Contrast 09/02/2018 IMPRESSION:  1. No CT evidence for acute intra-abdominal or pelvic abnormality.  2. Subtle contour nodularity of the liver suggesting cirrhosis. The spleen appears slightly enlarged  3. Findings suspicious for acute mildly impacted right subcapital femoral neck fracture.  4. Nonobstructing stones in the right kidney    Ct Angio Head W Or Wo Contrast  Result Date: 09/04/2018 CLINICAL DATA:  Confusion and altered mental status, acutely worsened after a fall. EXAM: CT ANGIOGRAPHY HEAD AND NECK TECHNIQUE: Multidetector CT imaging of the head and  neck was performed using the standard protocol during bolus administration of intravenous contrast. Multiplanar CT image reconstructions and MIPs were obtained to evaluate the vascular anatomy. Carotid stenosis measurements (when applicable) are obtained utilizing NASCET criteria, using the distal internal carotid diameter as the denominator. CONTRAST:  71mL ISOVUE-370 IOPAMIDOL (ISOVUE-370) INJECTION 76% COMPARISON:  CT head 09/02/2018. MR head 09/03/2018 demonstrated acute and subacute infarcts. FINDINGS: CT HEAD FINDINGS Brain: No evidence for acute infarction, hemorrhage, mass lesion, hydrocephalus, or extra-axial fluid. Slight atrophy. Extensive white matter disease. Chronic LEFT  basal ganglia infarct. Acute to subacute infarcts demonstrated on yesterday's MR are poorly visualized on today's study. Vascular: Reported separately. Skull: Intact. Sinuses: Imaged portions are clear. Orbits: No acute finding. Review of the MIP images confirms the above findings CTA NECK FINDINGS Aortic arch: Standard branching. Imaged portion shows no evidence of aneurysm or dissection. No significant stenosis of the major arch vessel origins. Unusually low origin LEFT vertebral from proximal subclavian, with apparent thyrocervical branch arising directly from the proximal vertebral. Right carotid system: No evidence of dissection, stenosis (50% or greater) or occlusion. Left carotid system: No evidence of dissection, stenosis (50% or greater) or occlusion. Vertebral arteries: BILATERAL patent, RIGHT dominant. No significant proximal narrowing. Skeleton: Unremarkable.  Spondylosis. Other neck: Noncontributory. Upper chest: No mass, effusion, or pneumothorax. Review of the MIP images confirms the above findings. CTA HEAD FINDINGS Anterior circulation: No significant stenosis, proximal occlusion, aneurysm, or vascular malformation. Posterior circulation: No significant stenosis, proximal occlusion, aneurysm, or vascular malformation. Venous sinuses: As permitted by contrast timing, patent. Anatomic variants: LEFT vertebral predominantly contributes to PICA. Delayed phase: No abnormal intracranial enhancement. Review of the MIP images confirms the above findings IMPRESSION: 1. No extracranial or intracranial flow reducing lesion is identified. 2. No abnormal postcontrast enhancement. 3. Non dominant LEFT vertebral primarily supplies PICA. Electronically Signed   By: Staci Righter M.D.   On: 09/04/2018 13:10   Ct Angio Neck W Or Wo Contrast  Result Date: 09/04/2018 CLINICAL DATA:  Confusion and altered mental status, acutely worsened after a fall. EXAM: CT ANGIOGRAPHY HEAD AND NECK TECHNIQUE: Multidetector CT  imaging of the head and neck was performed using the standard protocol during bolus administration of intravenous contrast. Multiplanar CT image reconstructions and MIPs were obtained to evaluate the vascular anatomy. Carotid stenosis measurements (when applicable) are obtained utilizing NASCET criteria, using the distal internal carotid diameter as the denominator. CONTRAST:  72mL ISOVUE-370 IOPAMIDOL (ISOVUE-370) INJECTION 76% COMPARISON:  CT head 09/02/2018. MR head 09/03/2018 demonstrated acute and subacute infarcts. FINDINGS: CT HEAD FINDINGS Brain: No evidence for acute infarction, hemorrhage, mass lesion, hydrocephalus, or extra-axial fluid. Slight atrophy. Extensive white matter disease. Chronic LEFT basal ganglia infarct. Acute to subacute infarcts demonstrated on yesterday's MR are poorly visualized on today's study. Vascular: Reported separately. Skull: Intact. Sinuses: Imaged portions are clear. Orbits: No acute finding. Review of the MIP images confirms the above findings CTA NECK FINDINGS Aortic arch: Standard branching. Imaged portion shows no evidence of aneurysm or dissection. No significant stenosis of the major arch vessel origins. Unusually low origin LEFT vertebral from proximal subclavian, with apparent thyrocervical branch arising directly from the proximal vertebral. Right carotid system: No evidence of dissection, stenosis (50% or greater) or occlusion. Left carotid system: No evidence of dissection, stenosis (50% or greater) or occlusion. Vertebral arteries: BILATERAL patent, RIGHT dominant. No significant proximal narrowing. Skeleton: Unremarkable.  Spondylosis. Other neck: Noncontributory. Upper chest: No mass, effusion, or pneumothorax. Review of the MIP images confirms the  above findings. CTA HEAD FINDINGS Anterior circulation: No significant stenosis, proximal occlusion, aneurysm, or vascular malformation. Posterior circulation: No significant stenosis, proximal occlusion, aneurysm, or  vascular malformation. Venous sinuses: As permitted by contrast timing, patent. Anatomic variants: LEFT vertebral predominantly contributes to PICA. Delayed phase: No abnormal intracranial enhancement. Review of the MIP images confirms the above findings IMPRESSION: 1. No extracranial or intracranial flow reducing lesion is identified. 2. No abnormal postcontrast enhancement. 3. Non dominant LEFT vertebral primarily supplies PICA. Electronically Signed   By: Staci Righter M.D.   On: 09/04/2018 13:10    Dg Hip Unilat With Pelvis 2-3 Views Right 09/02/2018 IMPRESSION:  Nondisplaced right femoral neck fracture.  No dislocation.    Vas US Carotid (at [facility] Only) 09/03/2018 Summary:  Right Carotid: Velocities in the right ICA are consistent with a 1-39% stenosis.  Left Carotid: Velocities in the left ICA are consistent with a 1-39% stenosis.  Vertebrals: Bilateral vertebral arteries demonstrate antegrade flow.    Transthoracic Echocardiogram  09/03/2018 Study Conclusions - Left ventricle: The cavity size was normal. Systolic function was   normal. The estimated ejection fraction was in the range of 50% to 55%. - Atrial septum: No defect or patent foramen ovale was identified. Impressions: - Extremely limited views; essentially no parasternal windows and   only minimal apical windows. Most information from subcostal   views, and use of echo contrast did not significantly improve   imaging.   Grossly normal LVEF. Cannot determine wall motion. Unable to see   most valvular structures.    Bilateral LE Venous  Dopplers  09/04/2018 Pending    PHYSICAL EXAM  Temp:  [98.5 F (36.9 C)-98.7 F (37.1 C)] 98.5 F (36.9 C) (12/14 1342) Pulse Rate:  [61-75] 75 (12/14 1342) Resp:  [19] 19 (12/14 1342) BP: (144-174)/(78-94) 174/94 (12/14 1342) SpO2:  [93 %-97 %] 97 % (12/14 1342)  General - Well nourished, well developed, in no apparent distress.  Ophthalmologic - fundi not  visualized due to noncooperation.  Cardiovascular - Regular rate and rhythm.  Mental Status -  Level of arousal and orientation to place, and person were intact, but not to time. Language including naming, repetition, comprehension was assessed and found intact. However, pt has hesitation of speech associated with anxiety and frustration. No dysarthria  Cranial Nerves II - XII - II - Visual field intact OU. III, IV, VI - Extraocular movements intact. V - Facial sensation intact bilaterally. VII - right nasolabial fold flattening. VIII - Hearing & vestibular intact bilaterally. X - Palate elevates symmetrically. XI - Chin turning & shoulder shrug intact bilaterally. XII - Tongue protrusion intact.  Motor Strength - The patient's strength was normal in all extremities and pronator drift was absent except right LE not able to test due to fracture and painful on movement.  Bulk was normal and fasciculations were absent.   Motor Tone - Muscle tone was assessed at the neck and appendages and was normal.  Reflexes - The patient's reflexes were symmetrical in all extremities except not able to test on RLE and he had no pathological reflexe on the left.  Sensory - Light touch, temperature/pinprick were assessed and were symmetrical.    Coordination - The patient had normal movements in the hands with no ataxia or dysmetria.  Tremor was absent.  Gait and Station - deferred.    ASSESSMENT/PLAN Mr. Trevonne Nyland is a 62 y.o. male with history of HTN, DM, COPD, cirrhosis and hepatitis presenting with AMS,  speech difficulties after a mechanical fall. He did not receive IV t-PA due to late presentation.   Strokes:  Multiple punctate infarcts at right MCA/ACA territory, embolic pattern, etiology unclear, fat emboli vs. paradoxial emboli vs cardioembolic source  Resultant speech hesitancy  CT head - Small chronic lacunar infarcts within the thalamus and right basal ganglia.   MRI head -  Multiple subcentimeter infarcts of mixed age scattered throughout the frontal and parietal white matter. Multiple old infarcts.  CTA H&N - unremarkable  Carotid Doppler  - unremarkable  2D Echo - EF 50 - 55%. No cardiac source of emboli identified.   LE venous doppler pending  Recommend 30 day cardiac event monitoring as outpt to rule out afib  LDL - 128  HgbA1c - 5.3  UDS  - unremarkable  VTE prophylaxis - NPO  Diet - SCDs  No antithrombotic prior to admission, now on aspirin 81 mg daily.   Patient counseled to be compliant with his antithrombotic medications  Ongoing aggressive stroke risk factor management  Therapy recommendations:  pending  Disposition:  Pending  Right hip fracture  After mechanical fall  Hip X-ray showed nondisplaced right hip fracture  Orthopedic surgery on board  Plan for surgery this afternoon  OK from neuro standpoint for the surgery, recommend to avoid low BP  Hypertension  Stable . Permissive hypertension (OK if < 220/120) but gradually normalize in 5-7 days . Avoid low BP . Long-term BP goal normotensive  Hyperlipidemia  Lipid lowering medication PTA:  none  LDL 128, goal < 70  AST and ALT normal range  Current lipid lowering medication: Lipitor 20 mg daily   Continue statin at discharge  Diabetes  HgbA1c 5.3, goal < 7.0  Controlled  SSI  CBG monitoring  Tobacco abuse  Current smoker  Smoking cessation counseling provided  Pt is willing to quit  Other Stroke Risk Factors  Advanced age  Hx stroke/TIA by imaging  Other Active Problems  COPD  Cirrhosis on CT abdomen  Thrombocytopenia - platelet 59   Hospital day # 2  I spent  35 minutes in total face-to-face time with the patient, more than 50% of which was spent in counseling and coordination of care, reviewing test results, images and medication, and discussing the diagnosis of stroke, right hip fracture, timing of surgery, clearance of  surgery, treatment plan and potential prognosis. This patient's care requiresreview of multiple databases, neurological assessment, discussion with family, other specialists and medical decision making of high complexity.   Rosalin Hawking, MD PhD Stroke Neurology 09/04/2018 2:22 PM    To contact Stroke Continuity provider, please refer to [email or web address] After hours, contact General Neurology

## 2018-09-04 NOTE — Plan of Care (Signed)
Pt seen and examined. Neuro stable. CTA head and neck unremarkable. Ok from neuro standpoint to proceed with right hip surgery. Recommend to avoid hypotension perioperatively.   Rosalin Hawking, MD PhD Stroke Neurology 09/04/2018 2:09 PM

## 2018-09-04 NOTE — Anesthesia Procedure Notes (Signed)
Procedure Name: Intubation Date/Time: 09/04/2018 3:42 PM Performed by: Jearld Pies, CRNA Pre-anesthesia Checklist: Patient identified, Emergency Drugs available, Suction available and Patient being monitored Patient Re-evaluated:Patient Re-evaluated prior to induction Oxygen Delivery Method: Circle System Utilized Preoxygenation: Pre-oxygenation with 100% oxygen Induction Type: IV induction Ventilation: Mask ventilation without difficulty and Oral airway inserted - appropriate to patient size Laryngoscope Size: Mac and 4 Grade View: Grade I Tube type: Oral Tube size: 7.5 mm Number of attempts: 1 Airway Equipment and Method: Stylet and Oral airway Placement Confirmation: ETT inserted through vocal cords under direct vision,  positive ETCO2 and breath sounds checked- equal and bilateral Secured at: 23 cm Tube secured with: Tape Dental Injury: Teeth and Oropharynx as per pre-operative assessment

## 2018-09-04 NOTE — Progress Notes (Signed)
PT Cancellation Note  Patient Details Name: Matthew Roman MRN: [id number] DOB: [birth year]   Cancelled Treatment:    Reason Eval/Treat Not Completed: Patient not medically ready  Per surgeon note patient will remain on bed rest, planning for surgery today or tomorrow. Will d/c PT evaluation. Please order post-op when patient is medically ready for comprehensive Physical Therapy evaluation.    [place], [place], [place] [phone]   Ellouise Newer 09/04/2018, 11:32 AM

## 2018-09-04 NOTE — Transfer of Care (Signed)
Immediate Anesthesia Transfer of Care Note  Patient: Matthew Roman  Procedure(s) Performed: CANNULATED HIP PINNING (Right Hip)  Patient Location: PACU  Anesthesia Type:General  Level of Consciousness: awake, alert  and oriented  Airway & Oxygen Therapy: Patient Spontanous Breathing and Patient connected to nasal cannula oxygen  Post-op Assessment: Report given to RN and Post -op Vital signs reviewed and stable  Post vital signs: Reviewed and stable  Last Vitals:  Vitals Value Taken Time  BP 161/90   Temp    Pulse 68   Resp 16   SpO2 96     Last Pain:  Vitals:   09/04/18 1342  TempSrc: Oral  PainSc:       Patients Stated Pain Goal: 2 (51/10/21 1173)  Complications: No apparent anesthesia complications

## 2018-09-04 NOTE — Anesthesia Postprocedure Evaluation (Signed)
Anesthesia Post Note  Patient: Matthew Roman  Procedure(s) Performed: CANNULATED HIP PINNING (Right Hip)     Patient location during evaluation: PACU Anesthesia Type: General Level of consciousness: awake and alert, oriented and patient cooperative Pain management: pain level controlled Vital Signs Assessment: post-procedure vital signs reviewed and stable Respiratory status: spontaneous breathing, nonlabored ventilation and respiratory function stable Cardiovascular status: blood pressure returned to baseline and stable Postop Assessment: no apparent nausea or vomiting Anesthetic complications: no    Last Vitals:  Vitals:   09/04/18 1821 09/04/18 1823  BP: (!) 167/89   Pulse: (!) 57 73  Resp: (!) 28 (!) 25  Temp:  36.6 C  SpO2: 96% 94%    Last Pain:  Vitals:   09/04/18 1342  TempSrc: Oral  PainSc:                  Shanaia Sievers,E. Harim Bi

## 2018-09-04 NOTE — Progress Notes (Signed)
OT Cancellation Note  Patient Details Name: Matthew Roman MRN: [id number] DOB: 08-28-1956   Cancelled Treatment:    Reason Eval/Treat Not Completed: Active bedrest order(with plan for surgery today or tomorrow).  Please updated activity orders to allow for OT evaluation.  Will continue to follow and initiate eval when appropriate and able.   Delight Stare, OT Acute Rehabilitation Services Pager [phone] Office [phone]   Delight Stare 09/04/2018, 10:07 AM

## 2018-09-04 NOTE — Progress Notes (Signed)
PROGRESS NOTE  Matthew Roman QHU:[id number] DOB: 03/03/1956 DOA: 09/02/2018 PCP: Patient, No Pcp Per   LOS: 2 days   Brief Narrative / Interim history: 62 year old male with history of COPD, liver cirrhosis, hypertension, who is being brought to the hospital on 12/12 with confusion, disorientation, progressively worse over the last several weeks per granddaughter.  He has had a fall at the nursing home while visiting the wife, followed by severe right hip pain.  He was brought to the ED, was found to have hip fracture and was admitted to the hospital.  Subjective: -Feels slightly improved, he is alert to time, place and situation.  He appreciates that his word finding difficulty is improving  Assessment & Plan: Principal Problem:   Fracture of femoral neck, right, closed ([place]) Active Problems:   Acute encephalopathy   Multiple lacunar infarcts (HCC)   Principal Problem Acute CVA -CT scan initially in the ED showed small age-indeterminate lacunar infarct within the thalamus and right basal ganglia -This was followed by an MRI today which showed multiple subcentimeter infarcts of mixed age scattered throughout the frontal and parietal white matter, including a prominent subacute infarct in the right medial frontal lobe and acute infarct in the right posterior superior frontal lobe without associated hemorrhage or mass-effect.  It also showed chronic infarcts in multiple other regions with severe chronic microvascular ischemic changes and moderate volume loss of brain for age. -undergoing stroke work-up, hemoglobin A1c was done and was 5.3, lipid panel showed an LDL of 113, will start statin. -2D echo with limited views but normal EF -Awaiting stroke neuro evaluation today to determine clearance for surgery  Additional Problems Right femoral neck fracture -Orthopedics consulted, plan for surgery once patient cleared by neurology given acute stroke   Acute on chronic metabolic  encephalopathy -Likely in the setting of #1, patient alert and oriented on my evaluation, likely not true encephalopathy but expressive aphasia makes communication difficult and may appear that patient is encephalopathic. -Stable today  Possible liver cirrhosis -CT scan of the abdomen pelvis done on 12/12 showed subtle contour nodularity of the liver suggesting cirrhosis with splenomegaly -Per prior reports he has a history of hep C that was treated in 2017.  Also has a history of alcohol abuse. -No significant evidence of decompensation, slightly thrombocytopenic  COPD/emphysema -In the setting of tobacco use, no wheezing, not an active issue  Colon cancer -Status post a routine screening colonoscopy in 03/2017, 1 of the polyps removed was malignant.  Evaluated by general surgery as an outpatient, and there is a possibility that his cancer was completely removed during the polypectomy and after discussion with the patient he did not wanted surgery but chose for observation and follow-up.  Continue outpatient management   Scheduled Meds: . aspirin EC  81 mg Oral Daily  . atorvastatin  20 mg Oral q1800  . feeding supplement (ENSURE ENLIVE)  237 mL Oral TID BM  . [START ON 09/05/2018] multivitamin with minerals  1 tablet Oral Daily  . povidone-iodine  2 application Topical Once  . pregabalin  150 mg Oral BID   Continuous Infusions: .  ceFAZolin (ANCEF) IV     PRN Meds:.HYDROcodone-acetaminophen, morphine injection  DVT prophylaxis: SCDs Code Status: Full code Family Communication: No family present at bedside Disposition Plan: Home versus SNF  Consultants:   Neurology, Dr. Cheral Marker, Dr. Erlinda Hong  Orthopedic surgery, Dr. Griffin Basil, Dr. Mardelle Matte  Procedures:   2D echo:  Impressions: - Extremely limited views; essentially no parasternal windows  and only minimal apical windows. Most information from subcostal views, and use of echo contrast did not significantly improve imaging. - Grossly  normal LVEF. Cannot determine wall motion. Unable to see most valvular structures.  Antimicrobials:  None    Objective: Vitals:   09/03/18 1336 09/03/18 1616 09/03/18 1926 09/04/18 0612  BP: (!) 175/87 (!) 156/91 (!) 144/78 (!) 168/88  Pulse:  74 61 73  Resp:      Temp:   98.7 F (37.1 C) 98.6 F (37 C)  TempSrc:   Oral Oral  SpO2:  94% 95% 93%  Weight:      Height:        Intake/Output Summary (Last 24 hours) at 09/04/2018 1052 Last data filed at 09/03/2018 1800 Gross per 24 hour  Intake 120 ml  Output -  Net 120 ml   Filed Weights   09/02/18 1836  Weight: 86.2 kg    Examination:  Constitutional: No distress Eyes: No scleral icterus ENMT: Moist mucous membranes Respiratory: Clear to auscultation bilaterally without wheezing or crackles, normal respiratory effort Cardiovascular: Regular rate and rhythm, no murmurs appreciated.  No peripheral edema Abdomen: soft, nontender, nondistended, positive bowel sounds Musculoskeletal: no clubbing / cyanosis.  Skin: No rashes appreciated Neurologic: No focal deficits, still with expressive aphasia Psychiatric: Alert and oriented x3   Data Reviewed: I have independently reviewed following labs and imaging studies  CBC: Recent Labs  Lab 09/02/18 1844 09/04/18 0245  WBC 8.6 7.0  NEUTROABS 6.8  --   HGB 16.1 15.7  HCT 50.1 48.0  MCV 88.2 88.1  PLT 90* 78*   Basic Metabolic Panel: Recent Labs  Lab 09/02/18 1844 09/04/18 0245  NA 139 142  K 4.0 3.5  CL 104 106  CO2 22 24  GLUCOSE 188* 112*  BUN 9 11  CREATININE 0.93 1.00  CALCIUM 9.3 9.1   GFR: Estimated Creatinine Clearance: 83 mL/min (by C-G formula based on SCr of 1 mg/dL). Liver Function Tests: Recent Labs  Lab 09/02/18 1844  AST 23  ALT 18  ALKPHOS 117  BILITOT 2.2*  PROT 6.7  ALBUMIN 4.2   No results for input(s): LIPASE, AMYLASE in the last 168 hours. Recent Labs  Lab 09/02/18 1845  AMMONIA 27   Coagulation Profile: Recent Labs    Lab 09/03/18 0825  INR 1.19   Cardiac Enzymes: No results for input(s): CKTOTAL, CKMB, CKMBINDEX, TROPONINI in the last 168 hours. BNP (last 3 results) No results for input(s): PROBNP in the last 8760 hours. HbA1C: Recent Labs    09/03/18 0141 09/04/18 0245  HGBA1C 5.3 5.3   CBG: Recent Labs  Lab 09/02/18 1857  GLUCAP 191*   Lipid Profile: Recent Labs    09/03/18 0141 09/04/18 0245  CHOL 165 179  HDL 32* 30*  LDLCALC 113* 128*  TRIG 101 106  CHOLHDL 5.2 6.0   Thyroid Function Tests: No results for input(s): TSH, T4TOTAL, FREET4, T3FREE, THYROIDAB in the last 72 hours. Anemia Panel: No results for input(s): VITAMINB12, FOLATE, FERRITIN, TIBC, IRON, RETICCTPCT in the last 72 hours. Urine analysis:    Component Value Date/Time   COLORURINE YELLOW 09/02/2018 2009   APPEARANCEUR CLEAR 09/02/2018 2009   LABSPEC 1.012 09/02/2018 2009   PHURINE 6.0 09/02/2018 2009   GLUCOSEU 50 (A) 09/02/2018 2009   HGBUR NEGATIVE 09/02/2018 2009   Gordon NEGATIVE 09/02/2018 2009   KETONESUR 5 (A) 09/02/2018 2009   PROTEINUR NEGATIVE 09/02/2018 2009   NITRITE NEGATIVE 09/02/2018 2009   LEUKOCYTESUR  NEGATIVE 09/02/2018 2009   Sepsis Labs: Invalid input(s): PROCALCITONIN, LACTICIDVEN  Recent Results (from the past 240 hour(s))  Blood culture (routine x 2)     Status: None (Preliminary result)   Collection Time: 09/02/18  7:38 PM  Result Value Ref Range Status   Specimen Description BLOOD LEFT ARM  Final   Special Requests   Final    BOTTLES DRAWN AEROBIC AND ANAEROBIC Blood Culture adequate volume   Culture   Final    NO GROWTH < 12 HOURS Performed at [facility], [address], [place], [place] [address]    Report Status PENDING  Incomplete  Blood culture (routine x 2)     Status: None (Preliminary result)   Collection Time: 09/02/18  7:40 PM  Result Value Ref Range Status   Specimen Description BLOOD LEFT HAND  Final   Special Requests   Final    BOTTLES  DRAWN AEROBIC ONLY Blood Culture results may not be optimal due to an excessive volume of blood received in culture bottles   Culture   Final    NO GROWTH < 12 HOURS Performed at [facility], [place] [address] [place], [place] [address]    Report Status PENDING  Incomplete  MRSA PCR Screening     Status: None   Collection Time: 09/03/18  1:02 AM  Result Value Ref Range Status   MRSA by PCR NEGATIVE NEGATIVE Final    Comment:        The GeneXpert MRSA Assay (FDA approved for NASAL specimens only), is one component of a comprehensive MRSA colonization surveillance program. It is not intended to diagnose MRSA infection nor to guide or monitor treatment for MRSA infections. Performed at [facility], [place] [address] [place], [place] [address]       Radiology Studies: Ct Head Wo Contrast  Result Date: 09/02/2018 CLINICAL DATA:  Disoriented EXAM: CT HEAD WITHOUT CONTRAST TECHNIQUE: Contiguous axial images were obtained from the base of the skull through the vertex without intravenous contrast. COMPARISON:  MRI 05/07/2005 FINDINGS: Brain: No acute territorial infarction, hemorrhage or intracranial mass is visualized. Chronic infarct within the left basal ganglia and white matter. Age indeterminate small lacunar infarcts within the thalamus and right basal ganglia. Atrophy with moderate small vessel ischemic changes of the white matter. Stable ventricle size. Vascular: No hyperdense vessels.  Carotid vascular calcification. Skull: Normal. Negative for fracture or focal lesion. Sinuses/Orbits: Mucosal thickening in the ethmoid and maxillary sinuses. Other: None. IMPRESSION: 1. Small age indeterminate lacunar infarcts within the thalamus and right basal ganglia. 2. 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 Atrophy and small vessel ischemic changes of the white matter. Electronically Signed   By: Donavan Foil M.D.   On: 09/02/2018 21:25   Mr Brain Wo Contrast  Result  Date: 09/03/2018 CLINICAL DATA:  62 y/o M; chronic confusion and altered mental status acutely worsened after a fall yesterday. EXAM: MRI HEAD WITHOUT CONTRAST TECHNIQUE: Multiplanar, multiecho pulse sequences of the brain and surrounding structures were obtained without intravenous contrast. COMPARISON:  09/02/2018 CT head.  05/07/2005 MRI head. FINDINGS: Brain: Multiple subcentimeter infarcts of mixed age scattered throughout frontal and parietal white matter including a 9 mm subacute infarct with intermediate diffusion in the right medial frontal lobe (series 5, image 79) and 6 mm acute infarct in the right posterosuperior frontal lobe (series 5 image 84). No associated hemorrhage or mass effect. Chronic hemorrhagic lacunar infarct involving the left lentiform nucleus,  medial thalamus, and cerebral peduncle. Additional small chronic lacunar infarcts are present in the bilateral thalami. Patchy nonspecific T2 FLAIR hyperintensities in subcortical and periventricular white matter as well as the pons are compatible with severe chronic microvascular ischemic changes for age. Moderate volume loss of the brain. No abnormal susceptibility hypointensity to indicate acute intracranial hemorrhage. Vascular: Normal flow voids. Skull and upper cervical spine: Normal marrow signal. Sinuses/Orbits: Negative. Other: None. IMPRESSION: 1. Multiple subcentimeter infarcts of mixed age scattered throughout the frontal and parietal white matter including a prominent subacute infarct in the right medial frontal lobe and acute infarct in the right posterosuperior frontal lobe. No associated acute hemorrhage or mass effect. 2. Chronic infarct of left lentiform nucleus, thalamus, and cerebral peduncle. Small chronic lacunar infarcts of the thalami. 3. Severe chronic microvascular ischemic changes and moderate volume loss of the brain for age. Electronically Signed   By: Kristine Garbe M.D.   On: 09/03/2018 04:37   Ct Abdomen  Pelvis W Contrast  Result Date: 09/02/2018 CLINICAL DATA:  Abdominal distension EXAM: CT ABDOMEN AND PELVIS WITH CONTRAST TECHNIQUE: Multidetector CT imaging of the abdomen and pelvis was performed using the standard protocol following bolus administration of intravenous contrast. CONTRAST:  141mL OMNIPAQUE IOHEXOL 300 MG/ML  SOLN COMPARISON:  05/21/2011 FINDINGS: Lower chest: Lung bases demonstrate a stable right lower lobe pulmonary nodule. No acute consolidation or effusion. The heart size is normal. Hepatobiliary: Subtle contour nodularity of the liver suggesting cirrhosis. Status post cholecystectomy. No biliary dilatation. Pancreas: Unremarkable. No pancreatic ductal dilatation or surrounding inflammatory changes. Spleen: Slightly enlarged. Adrenals/Urinary Tract: Adrenal glands are normal. No hydronephrosis. Small stones in the mid right kidney measuring up to 4 mm. Cyst in the lower pole of the left kidney. Bladder is normal. Stomach/Bowel: Stomach nonenlarged. No dilated small bowel. No colon wall thickening. Negative appendix. Vascular/Lymphatic: Moderate aortic atherosclerosis and mural thrombus. No aneurysm. No significantly enlarged lymph nodes. Reproductive: Slightly enlarged prostate Other: No free air or free fluid. Musculoskeletal: Postsurgical changes at L5-S1 with stabilization rods and fixating screws. Acute, slightly impacted appearing right femoral neck fracture. IMPRESSION: 1. No CT evidence for acute intra-abdominal or pelvic abnormality. 2. Subtle contour nodularity of the liver suggesting cirrhosis. The spleen appears slightly enlarged 3. Findings suspicious for acute mildly impacted right subcapital femoral neck fracture. 4. Nonobstructing stones in the right kidney Electronically Signed   By: Donavan Foil M.D.   On: 09/02/2018 21:19   Dg Chest Port 1 View  Result Date: 09/02/2018 CLINICAL DATA:  Altered mental status. EXAM: PORTABLE CHEST 1 VIEW COMPARISON:  Radiographs of  November 03, 2016. FINDINGS: The heart size and mediastinal contours are within normal limits. Both lungs are clear. The visualized skeletal structures are unremarkable. IMPRESSION: No active disease. Electronically Signed   By: Marijo Conception, M.D.   On: 09/02/2018 19:01   Dg Hip Unilat With Pelvis 2-3 Views Right  Result Date: 09/02/2018 CLINICAL DATA:  62 year old male with fall and right hip pain. EXAM: DG HIP (WITH OR WITHOUT PELVIS) 2-3V RIGHT COMPARISON:  CT of the abdomen pelvis dated 09/02/2018 FINDINGS: There is a nondisplaced mildly impacted fracture of the right femoral neck with foreshortening of the femoral neck. No other acute fracture identified. The bones are osteopenic. There is no dislocation. Mild to moderate bilateral hip arthritic changes. Lower lumbar fusion screws. The soft tissues are grossly unremarkable. Excreted contrast noted within the bladder. IMPRESSION: Nondisplaced right femoral neck fracture.  No dislocation. Electronically Signed   By:  Anner Crete M.D.   On: 09/02/2018 22:48   Vas US Carotid (at [place] Only)  Result Date: 09/03/2018 Carotid Arterial Duplex Study Indications: Lacunar infarction. Performing Technologist: Oliver Hum RVT  Examination Guidelines: A complete evaluation includes B-mode imaging, spectral Doppler, color Doppler, and power Doppler as needed of all accessible portions of each vessel. Bilateral testing is considered an integral part of a complete examination. Limited examinations for reoccurring indications may be performed as noted.  Right Carotid Findings: +----------+--------+-------+--------+--------------------------------+--------+           PSV cm/sEDV    StenosisDescribe                        Comments                   cm/s                                                    +----------+--------+-------+--------+--------------------------------+--------+ CCA Prox  91      12             smooth and  heterogenous                  +----------+--------+-------+--------+--------------------------------+--------+ CCA Distal70      18             smooth and heterogenous                  +----------+--------+-------+--------+--------------------------------+--------+ ICA Prox  38      10             smooth, heterogenous and                                                  calcific                                 +----------+--------+-------+--------+--------------------------------+--------+ ICA Distal73      15                                             tortuous +----------+--------+-------+--------+--------------------------------+--------+ ECA       99      13                                                      +----------+--------+-------+--------+--------------------------------+--------+ +----------+--------+-------+--------+-------------------+           PSV cm/sEDV cmsDescribeArm Pressure (mmHG) +----------+--------+-------+--------+-------------------+ [id number]                                        +----------+--------+-------+--------+-------------------+ +---------+--------+--+--------+--+---------+ VertebralPSV cm/s44EDV cm/s14Antegrade +---------+--------+--+--------+--+---------+  Left Carotid Findings: +----------+--------+--------+--------+-----------------------+--------+           PSV cm/sEDV cm/sStenosisDescribe  Comments +----------+--------+--------+--------+-----------------------+--------+ CCA Prox  110     11              smooth and heterogenous         +----------+--------+--------+--------+-----------------------+--------+ CCA Distal73      15              smooth and heterogenous         +----------+--------+--------+--------+-----------------------+--------+ ICA Prox  53      12              smooth and heterogenous          +----------+--------+--------+--------+-----------------------+--------+ ICA Distal66      14                                     tortuous +----------+--------+--------+--------+-----------------------+--------+ ECA       88      16                                              +----------+--------+--------+--------+-----------------------+--------+ +----------+--------+--------+--------+-------------------+ SubclavianPSV cm/sEDV cm/sDescribeArm Pressure (mmHG) +----------+--------+--------+--------+-------------------+           118                                         +----------+--------+--------+--------+-------------------+ +---------+--------+--+--------+-+---------+ VertebralPSV cm/s48EDV cm/s7Antegrade +---------+--------+--+--------+-+---------+  Summary: Right Carotid: Velocities in the right ICA are consistent with a 1-39% stenosis. Left Carotid: Velocities in the left ICA are consistent with a 1-39% stenosis. Vertebrals: Bilateral vertebral arteries demonstrate antegrade flow. *See table(s) above for measurements and observations.  Electronically signed by Deitra Mayo MD on 09/03/2018 at 1:50:48 PM.    Final     Marzetta Board, MD, PhD [facility] Pager [phone]  If 7PM-7AM, please contact night-coverage [email or web address] Password [email or web address] 09/04/2018, 10:52 AM

## 2018-09-04 NOTE — Progress Notes (Addendum)
Patient ID: Jaman Aro, male   DOB: 06-03-1956, 62 y.o.   MRN: [id number]     Subjective:  Patient reports pain as mild to moderate.  Patient in bed and in no acute distress  Objective:   VITALS:   Vitals:   09/03/18 1336 09/03/18 1616 09/03/18 1926 09/04/18 0612  BP: (!) 175/87 (!) 156/91 (!) 144/78 (!) 168/88  Pulse:  74 61 73  Resp:      Temp:   98.7 F (37.1 C) 98.6 F (37 C)  TempSrc:   Oral Oral  SpO2:  94% 95% 93%  Weight:      Height:        ABD soft Sensation intact distally Dorsiflexion/Plantar flexion intact   Lab Results  Component Value Date   WBC 7.0 09/04/2018   HGB 15.7 09/04/2018   HCT 48.0 09/04/2018   MCV 88.1 09/04/2018   PLT 78 (L) 09/04/2018   BMET    Component Value Date/Time   NA 142 09/04/2018 0245   K 3.5 09/04/2018 0245   CL 106 09/04/2018 0245   CO2 24 09/04/2018 0245   GLUCOSE 112 (H) 09/04/2018 0245   BUN 11 09/04/2018 0245   CREATININE 1.00 09/04/2018 0245   CALCIUM 9.1 09/04/2018 0245   GFRNONAA >60 09/04/2018 0245   GFRAA >60 09/04/2018 0245     Assessment/Plan: Day of Surgery   Principal Problem:   Fracture of femoral neck, right, closed ([place]) Active Problems:   Acute encephalopathy   Multiple lacunar infarcts [facility])   Plan for OR today or tomorrow when cleared Bed rest  NWB right lower ext    Lunette Stands 09/04/2018, 9:30 AM  Discussed and agree with above.  Will check in later today, awaiting optimization, may plan for surgery tomorrow if still questions. Will keep NPO for now.   Marchia Bond, MD Cell [phone]

## 2018-09-04 NOTE — Progress Notes (Signed)
RN verified the presence of a signed informed consent that matches stated procedure by patient. Patient was alert and oriented to person, place, and planned procedure when I verified consent. Verified armband matches patient's stated name and birth date. Verified NPO status and that all jewelry, contact, glasses, dentures, and partials had been removed (if applicable). Dentures removed in Short Stay and taken to PACU.

## 2018-09-04 NOTE — Progress Notes (Signed)
The patient has been re-examined, and the chart reviewed, and there have been no interval changes to the documented history and physical.  He has been seen by neurology and apparently is ok for surgery, discussed with nursing staff.  Awaiting their actual note, but we should be ok to proceed.    The risks benefits and alternatives were discussed with the patient including but not limited to the risks of nonoperative treatment, versus surgical intervention including infection, bleeding, nerve injury, malunion, nonunion, the need for revision surgery, hardware prominence, hardware failure, the need for hardware removal, blood clots, cardiopulmonary complications, morbidity, mortality, among others, and they were willing to proceed.

## 2018-09-04 NOTE — Discharge Instructions (Signed)
Diet: As you were doing prior to hospitalization   Shower:  May shower but keep the wounds dry, use an occlusive plastic wrap, NO SOAKING IN TUB.  If the bandage gets wet, change with a clean dry gauze.  If you have a splint on, leave the splint in place and keep the splint dry with a plastic bag.  Dressing:  You may change your dressing 3-5 days after surgery, unless you have a splint.  If you have a splint, then just leave the splint in place and we will change your bandages during your first follow-up appointment.    If you had hand or foot surgery, we will plan to remove your stitches in about 2 weeks in the office.  For all other surgeries, there are sticky tapes (steri-strips) on your wounds and all the stitches are absorbable.  Leave the steri-strips in place when changing your dressings, they will peel off with time, usually 2-3 weeks.  Activity:  Increase activity slowly as tolerated, but follow the weight bearing instructions below.  The rules on driving is that you can not be taking narcotics while you drive, and you must feel in control of the vehicle.    Weight Bearing:   As tolerated, limit based on hip/groin pain.    To prevent constipation: you may use a stool softener such as -  Colace (over the counter) 100 mg by mouth twice a day  Drink plenty of fluids (prune juice may be helpful) and high fiber foods Miralax (over the counter) for constipation as needed.    Itching:  If you experience itching with your medications, try taking only a single pain pill, or even half a pain pill at a time.  You may take up to 10 pain pills per day, and you can also use benadryl over the counter for itching or also to help with sleep.   Precautions:  If you experience chest pain or shortness of breath - call 911 immediately for transfer to the hospital emergency department!!  If you develop a fever greater that 101 F, purulent drainage from wound, increased redness or drainage from wound, or  calf pain -- Call the office at [phone]                                                Follow- Up Appointment:  Please call for an appointment to be seen in 2 weeks [place] - [phone]

## 2018-09-04 NOTE — Anesthesia Preprocedure Evaluation (Addendum)
Anesthesia Evaluation  Patient identified by MRN, date of birth, ID band Patient confused    Reviewed: Allergy & Precautions, NPO status , Patient's Chart, lab work & pertinent test results  Airway Mallampati: II  TM Distance: >3 FB Neck ROM: Full    Dental  (+) Lower Dentures, Upper Dentures   Pulmonary COPD,  COPD inhaler, Current Smoker,    Pulmonary exam normal breath sounds clear to auscultation       Cardiovascular hypertension, Normal cardiovascular exam Rhythm:Regular Rate:Normal  ECG: SR, rate 91   Neuro/Psych Acute encephalopathy negative psych ROS   GI/Hepatic negative GI ROS, (+) Cirrhosis       , Hepatitis -  Endo/Other  diabetes  Renal/GU negative Renal ROS     Musculoskeletal negative musculoskeletal ROS (+) LOW BACK PAIN   Abdominal   Peds  Hematology Thrombocytopenia    Anesthesia Other Findings non displaced femoral neck fracture  Reproductive/Obstetrics                           Anesthesia Physical Anesthesia Plan  ASA: III  Anesthesia Plan: General   Post-op Pain Management:    Induction: Intravenous  PONV Risk Score and Plan: 2 and Ondansetron, Dexamethasone and Treatment may vary due to age or medical condition  Airway Management Planned: Oral ETT  Additional Equipment:   Intra-op Plan:   Post-operative Plan: Extubation in OR  Informed Consent: I have reviewed the patients History and Physical, chart, labs and discussed the procedure including the risks, benefits and alternatives for the proposed anesthesia with the patient or authorized representative who has indicated his/her understanding and acceptance.   Dental advisory given  Plan Discussed with: CRNA  Anesthesia Plan Comments:        Anesthesia Quick Evaluation

## 2018-09-05 ENCOUNTER — Encounter (HOSPITAL_COMMUNITY): Payer: Self-pay | Admitting: *Deleted

## 2018-09-05 ENCOUNTER — Inpatient Hospital Stay (HOSPITAL_COMMUNITY): Payer: Medicare Other

## 2018-09-05 ENCOUNTER — Encounter (HOSPITAL_COMMUNITY): Payer: Self-pay

## 2018-09-05 DIAGNOSIS — I6340 Cerebral infarction due to embolism of unspecified cerebral artery: Principal | ICD-10-CM

## 2018-09-05 DIAGNOSIS — I639 Cerebral infarction, unspecified: Secondary | ICD-10-CM

## 2018-09-05 LAB — CBC
HCT: 43.9 % (ref 39.0–52.0)
HEMOGLOBIN: 14.4 g/dL (ref 13.0–17.0)
MCH: 28.6 pg (ref 26.0–34.0)
MCHC: 32.8 g/dL (ref 30.0–36.0)
MCV: 87.1 fL (ref 80.0–100.0)
Platelets: 77 10*3/uL — ABNORMAL LOW (ref 150–400)
RBC: 5.04 MIL/uL (ref 4.22–5.81)
RDW: 12.7 % (ref 11.5–15.5)
WBC: 7 10*3/uL (ref 4.0–10.5)
nRBC: 0 % (ref 0.0–0.2)

## 2018-09-05 LAB — BASIC METABOLIC PANEL
Anion gap: 13 (ref 5–15)
BUN: 18 mg/dL (ref 8–23)
CO2: 23 mmol/L (ref 22–32)
Calcium: 8.6 mg/dL — ABNORMAL LOW (ref 8.9–10.3)
Chloride: 104 mmol/L (ref 98–111)
Creatinine, Ser: 1.01 mg/dL (ref 0.61–1.24)
GFR calc Af Amer: >60 mL/min (ref >60)
GFR calc non Af Amer: >60 mL/min (ref >60)
Glucose, Bld: 144 mg/dL — ABNORMAL HIGH (ref 70–99)
Potassium: 3.7 mmol/L (ref 3.5–5.1)
SODIUM: 140 mmol/L (ref 135–145)

## 2018-09-05 LAB — LUPUS ANTICOAGULANT PANEL
DRVVT: 37.8 s (ref 0.0–47.0)
PTT Lupus Anticoagulant: 36.7 s (ref 0.0–51.9)

## 2018-09-05 LAB — GLUCOSE, CAPILLARY: Glucose-Capillary: 168 mg/dL — ABNORMAL HIGH (ref 70–99)

## 2018-09-05 MED ORDER — ENOXAPARIN SODIUM 40 MG/0.4ML ~~LOC~~ SOLN: 40.0000 mg | SUBCUTANEOUS | 0 refills | Status: DC

## 2018-09-05 MED ORDER — OXYCODONE HCL 15 MG PO TABS: 15.0000 mg | ORAL_TABLET | Freq: Three times a day (TID) | ORAL | 0 refills | Status: DC

## 2018-09-05 NOTE — Progress Notes (Signed)
Pt returned from surgery. One small mepilex intact to right thigh. No acute distress. Alert, disoriented to time, but same as prior to surgery. Family at bedside.

## 2018-09-05 NOTE — Progress Notes (Addendum)
Patient ID: Matthew Roman, male   DOB: 1956-06-09, 62 y.o.   MRN: [id number]     Subjective:  Patient reports pain as mild.  Patient reports improvement.  Objective:   VITALS:   Vitals:   09/04/18 1845 09/04/18 2137 09/04/18 2314 09/05/18 0439  BP:  (!) 171/97 (!) 159/82 139/79  Pulse:  71 63 (!) 50  Resp:  20 18   Temp:  98 F (36.7 C)  98.4 F (36.9 C)  TempSrc:  Oral  Oral  SpO2: 95% 95% 93% 96%  Weight:      Height:        ABD soft Sensation intact distally Dorsiflexion/Plantar flexion intact Incision: dressing C/D/I and no drainage   Lab Results  Component Value Date   WBC 7.0 09/05/2018   HGB 14.4 09/05/2018   HCT 43.9 09/05/2018   MCV 87.1 09/05/2018   PLT 77 (L) 09/05/2018   BMET    Component Value Date/Time   NA 140 09/05/2018 0403   K 3.7 09/05/2018 0403   CL 104 09/05/2018 0403   CO2 23 09/05/2018 0403   GLUCOSE 144 (H) 09/05/2018 0403   BUN 18 09/05/2018 0403   CREATININE 1.01 09/05/2018 0403   CALCIUM 8.6 (L) 09/05/2018 0403   GFRNONAA >60 09/05/2018 0403   GFRAA >60 09/05/2018 0403     Assessment/Plan: 1 Day Post-Op   Principal Problem:   Fracture of femoral neck, right, closed ([place]) Active Problems:   Acute encephalopathy   Multiple lacunar infarcts ([place])   Advance diet Up with therapy Continue plan per medicine WBAT Dry dressing PRN    Lunette Stands 09/05/2018, 11:45 AM  Patient seen and examined, and agree with above.  Plan for Lovenox for 1 month postoperatively for DVT prophylaxis, oxycodone 15 mg 3 times daily, that is his baseline pain medication for his back and we will continue this for his hip fracture.  Okay for weightbearing as tolerated, but if he can limit his weightbearing that would be preferable, however given his stroke I am not sure that is possible, he will continue to work with therapy.   Marchia Bond, MD Cell [phone]

## 2018-09-05 NOTE — Progress Notes (Signed)
*  Preliminary Results* Bilateral lower extremity venous duplex completed. Bilateral lower extremities are negative for deep vein thrombosis. There is no evidence of Baker's cyst bilaterally.  09/05/2018 1:45 PM Matthew Roman Dawna Part

## 2018-09-05 NOTE — Plan of Care (Signed)
  Problem: Nutrition: Goal: Adequate nutrition will be maintained Outcome: Progressing   Problem: Safety: Goal: Ability to remain free from injury will improve Outcome: Progressing   Problem: Skin Integrity: Goal: Risk for impaired skin integrity will decrease Outcome: Progressing   

## 2018-09-05 NOTE — Progress Notes (Signed)
Pt assisted back to bed from recliner. Pt continues to have a hard time finding words. This seems to be worse when he is being watched by other people. When he feels rushed he has a harder time, and then gets anxious and frustrated. Pt resting at this time

## 2018-09-05 NOTE — Progress Notes (Signed)
PROGRESS NOTE  Matthew Roman FKC:[id number] DOB: [birth year] DOA: 09/02/2018 PCP: Patient, No Pcp Per   LOS: 3 days   Brief Narrative / Interim history: 62 year old male with history of COPD, liver cirrhosis, hypertension, who is being brought to the hospital on 12/12 with confusion, disorientation, progressively worse over the last several weeks per granddaughter.  He has had a fall at the nursing home while visiting the wife, followed by severe right hip pain.  He was brought to the ED, was found to have hip fracture and was admitted to the hospital.  Subjective: -Feels slightly improved, he is alert to time, place and situation.  He appreciates that his word finding difficulty is improving  Assessment & Plan: Principal Problem:   Fracture of femoral neck, right, closed ([place]) Active Problems:   Acute encephalopathy   Multiple lacunar infarcts (H[facility])   Principal Problem Acute CVA -CT scan initially in the ED showed small age-indeterminate lacunar infarct within the thalamus and right basal ganglia -This was followed by an MRI today which showed multiple subcentimeter infarcts of mixed age scattered throughout the frontal and parietal white matter, including a prominent subacute infarct in the right medial frontal lobe and acute infarct in the right posterior superior frontal lobe without associated hemorrhage or mass-effect.  It also showed chronic infarcts in multiple other regions with severe chronic microvascular ischemic changes and moderate volume loss of brain for age. -undergoing stroke work-up, hemoglobin A1c was done and was 5.3, lipid panel showed an LDL of 113, will start statin. -2D echo with limited views but normal EF -CT angiogram head and neck without significant occlusion -Continue aspirin  Additional Problems Right femoral neck fracture -Orthopedics consulted, status post cannulated hip pinning on 12/14 by Dr. Mardelle Matte  Acute on chronic metabolic  encephalopathy -Likely in the setting of #1, patient alert and oriented on my evaluation, likely not true encephalopathy but expressive aphasia makes communication difficult and may appear that patient is encephalopathic. -Alert and oriented x4, appropriate  Possible liver cirrhosis -CT scan of the abdomen pelvis done on 12/12 showed subtle contour nodularity of the liver suggesting cirrhosis with splenomegaly -Per prior reports he has a history of hep C that was treated in 2017.  Also has a history of alcohol abuse. -No significant evidence of decompensation, slightly thrombocytopenic, no evidence of bleed and thrombocytopenia is stable  COPD/emphysema -In the setting of tobacco use, no wheezing, not an active issue  Colon cancer -Status post a routine screening colonoscopy in 03/2017, 1 of the polyps removed was malignant.  Evaluated by general surgery as an outpatient, and there is a possibility that his cancer was completely removed during the polypectomy and after discussion with the patient he did not wanted surgery but chose for observation and follow-up.  Continue outpatient management   Scheduled Meds: . acetaminophen  500 mg Oral Q6H  . atorvastatin  20 mg Oral q1800  . docusate sodium  100 mg Oral BID  . enoxaparin (LOVENOX) injection  40 mg Subcutaneous Q24H  . feeding supplement (ENSURE ENLIVE)  237 mL Oral TID BM  . ferrous sulfate  325 mg Oral TID PC  . ketorolac  7.5 mg Intravenous Q6H  . multivitamin with minerals  1 tablet Oral Daily  . pregabalin  150 mg Oral BID   Continuous Infusions: . 0.9 % NaCl with KCl 20 mEq / L Stopped (09/05/18 1011)   PRN Meds:.acetaminophen, alum & mag hydroxide-simeth, bisacodyl, HYDROcodone-acetaminophen, HYDROcodone-acetaminophen, magnesium citrate, menthol-cetylpyridinium **OR** phenol, metoCLOPramide **OR**  metoCLOPramide (REGLAN) injection, morphine injection, ondansetron **OR** ondansetron (ZOFRAN) IV, polyethylene glycol  DVT  prophylaxis: SCDs Code Status: Full code Family Communication: No family present at bedside Disposition Plan: Home versus SNF  Consultants:   Neurology, Dr. Cheral Marker, Dr. Erlinda Hong  Orthopedic surgery, Dr. Griffin Basil, Dr. Mardelle Matte  Procedures:   2D echo:  Impressions: - Extremely limited views; essentially no parasternal windows and only minimal apical windows. Most information from subcostal views, and use of echo contrast did not significantly improve imaging. - Grossly normal LVEF. Cannot determine wall motion. Unable to see most valvular structures.  Antimicrobials:  None    Objective: Vitals:   09/04/18 1845 09/04/18 2137 09/04/18 2314 09/05/18 0439  BP:  (!) 171/97 (!) 159/82 139/79  Pulse:  71 63 (!) 50  Resp:  20 18   Temp:  98 F (36.7 C)  98.4 F (36.9 C)  TempSrc:  Oral  Oral  SpO2: 95% 95% 93% 96%  Weight:      Height:        Intake/Output Summary (Last 24 hours) at 09/05/2018 1058 Last data filed at 09/05/2018 1000 Gross per 24 hour  Intake 2517.09 ml  Output 520 ml  Net 1997.09 ml   Filed Weights   09/02/18 1836  Weight: 86.2 kg    Examination:  Constitutional: NAD Respiratory: clear to auscultation bilaterally Cardiovascular: Regular rate and rhythm, no murmurs Neurologic: Nonfocal, equal strength, persistent word finding difficulty   Data Reviewed: I have independently reviewed following labs and imaging studies  CBC: Recent Labs  Lab 09/02/18 1844 09/04/18 0245 09/05/18 0403  WBC 8.6 7.0 7.0  NEUTROABS 6.8  --   --   HGB 16.1 15.7 14.4  HCT 50.1 48.0 43.9  MCV 88.2 88.1 87.1  PLT 90* 78* 77*   Basic Metabolic Panel: Recent Labs  Lab 09/02/18 1844 09/04/18 0245 09/05/18 0403  NA 139 142 140  K 4.0 3.5 3.7  CL 104 106 104  CO2 22 24 23   GLUCOSE 188* 112* 144*  BUN 9 11 18   CREATININE 0.93 1.00 1.01  CALCIUM 9.3 9.1 8.6*   GFR: Estimated Creatinine Clearance: 82.2 mL/min (by C-G formula based on SCr of 1.01 mg/dL). Liver  Function Tests: Recent Labs  Lab 09/02/18 1844  AST 23  ALT 18  ALKPHOS 117  BILITOT 2.2*  PROT 6.7  ALBUMIN 4.2   No results for input(s): LIPASE, AMYLASE in the last 168 hours. Recent Labs  Lab 09/02/18 1845  AMMONIA 27   Coagulation Profile: Recent Labs  Lab 09/03/18 0825  INR 1.19   Cardiac Enzymes: No results for input(s): CKTOTAL, CKMB, CKMBINDEX, TROPONINI in the last 168 hours. BNP (last 3 results) No results for input(s): PROBNP in the last 8760 hours. HbA1C: Recent Labs    09/03/18 0141 09/04/18 0245  HGBA1C 5.3 5.3   CBG: Recent Labs  Lab 09/02/18 1857 09/04/18 1438 09/04/18 1806  GLUCAP 191* 128* 135*   Lipid Profile: Recent Labs    09/03/18 0141 09/04/18 0245  CHOL 165 179  HDL 32* 30*  LDLCALC 113* 128*  TRIG 101 106  CHOLHDL 5.2 6.0   Thyroid Function Tests: No results for input(s): TSH, T4TOTAL, FREET4, T3FREE, THYROIDAB in the last 72 hours. Anemia Panel: No results for input(s): VITAMINB12, FOLATE, FERRITIN, TIBC, IRON, RETICCTPCT in the last 72 hours. Urine analysis:    Component Value Date/Time   COLORURINE YELLOW 09/02/2018 2009   APPEARANCEUR CLEAR 09/02/2018 2009   LABSPEC 1.012 09/02/2018 2009  PHURINE 6.0 09/02/2018 2009   GLUCOSEU 50 (A) 09/02/2018 2009   HGBUR NEGATIVE 09/02/2018 2009   [place] NEGATIVE 09/02/2018 2009   KETONESUR 5 (A) 09/02/2018 2009   PROTEINUR NEGATIVE 09/02/2018 2009   NITRITE NEGATIVE 09/02/2018 2009   LEUKOCYTESUR NEGATIVE 09/02/2018 2009   Sepsis Labs: Invalid input(s): PROCALCITONIN, LACTICIDVEN  Recent Results (from the past 240 hour(s))  Blood culture (routine x 2)     Status: None (Preliminary result)   Collection Time: 09/02/18  7:38 PM  Result Value Ref Range Status   Specimen Description BLOOD LEFT ARM  Final   Special Requests   Final    BOTTLES DRAWN AEROBIC AND ANAEROBIC Blood Culture adequate volume   Culture   Final    NO GROWTH 2 DAYS Performed at [facility], [address] [place], [place] [address]    Report Status PENDING  Incomplete  Blood culture (routine x 2)     Status: None (Preliminary result)   Collection Time: 09/02/18  7:40 PM  Result Value Ref Range Status   Specimen Description BLOOD LEFT HAND  Final   Special Requests   Final    BOTTLES DRAWN AEROBIC ONLY Blood Culture results may not be optimal due to an excessive volume of blood received in culture bottles   Culture   Final    NO GROWTH 2 DAYS Performed at [facility], [place] [address], [place], [place] [address]    Report Status PENDING  Incomplete  MRSA PCR Screening     Status: None   Collection Time: 09/03/18  1:02 AM  Result Value Ref Range Status   MRSA by PCR NEGATIVE NEGATIVE Final    Comment:        The GeneXpert MRSA Assay (FDA approved for NASAL specimens only), is one component of a comprehensive MRSA colonization surveillance program. It is not intended to diagnose MRSA infection nor to guide or monitor treatment for MRSA infections. Performed at [facility], [place] [address] [place], [place] [address]       Radiology Studies: Ct Angio Head W Or Wo Contrast  Result Date: 09/04/2018 CLINICAL DATA:  Confusion and altered mental status, acutely worsened after a fall. EXAM: CT ANGIOGRAPHY HEAD AND NECK TECHNIQUE: Multidetector CT imaging of the head and neck was performed using the standard protocol during bolus administration of intravenous contrast. Multiplanar CT image reconstructions and MIPs were obtained to evaluate the vascular anatomy. Carotid stenosis measurements (when applicable) are obtained utilizing NASCET criteria, using the distal internal carotid diameter as the denominator. CONTRAST:  30mL ISOVUE-370 IOPAMIDOL (ISOVUE-370) INJECTION 76% COMPARISON:  CT head 09/02/2018. MR head 09/03/2018 demonstrated acute and subacute infarcts. FINDINGS: CT HEAD FINDINGS Brain: No evidence for acute infarction, hemorrhage, mass lesion,  hydrocephalus, or extra-axial fluid. Slight atrophy. Extensive white matter disease. Chronic LEFT basal ganglia infarct. Acute to subacute infarcts demonstrated on yesterday's MR are poorly visualized on today's study. Vascular: Reported separately. Skull: Intact. Sinuses: Imaged portions are clear. Orbits: No acute finding. Review of the MIP images confirms the above findings CTA NECK FINDINGS Aortic arch: Standard branching. Imaged portion shows no evidence of aneurysm or dissection. No significant stenosis of the major arch vessel origins. Unusually low origin LEFT vertebral from proximal subclavian, with apparent thyrocervical branch arising directly from the proximal vertebral. Right carotid system: No evidence of dissection, stenosis (50% or greater) or occlusion. Left carotid system: No evidence of dissection, stenosis (50% or greater) or occlusion. Vertebral arteries:  BILATERAL patent, RIGHT dominant. No significant proximal narrowing. Skeleton: Unremarkable.  Spondylosis. Other neck: Noncontributory. Upper chest: No mass, effusion, or pneumothorax. Review of the MIP images confirms the above findings. CTA HEAD FINDINGS Anterior circulation: No significant stenosis, proximal occlusion, aneurysm, or vascular malformation. Posterior circulation: No significant stenosis, proximal occlusion, aneurysm, or vascular malformation. Venous sinuses: As permitted by contrast timing, patent. Anatomic variants: LEFT vertebral predominantly contributes to PICA. Delayed phase: No abnormal intracranial enhancement. Review of the MIP images confirms the above findings IMPRESSION: 1. No extracranial or intracranial flow reducing lesion is identified. 2. No abnormal postcontrast enhancement. 3. Non dominant LEFT vertebral primarily supplies PICA. Electronically Signed   By: Staci Righter M.D.   On: 09/04/2018 13:10   Ct Angio Neck W Or Wo Contrast  Result Date: 09/04/2018 CLINICAL DATA:  Confusion and altered mental  status, acutely worsened after a fall. EXAM: CT ANGIOGRAPHY HEAD AND NECK TECHNIQUE: Multidetector CT imaging of the head and neck was performed using the standard protocol during bolus administration of intravenous contrast. Multiplanar CT image reconstructions and MIPs were obtained to evaluate the vascular anatomy. Carotid stenosis measurements (when applicable) are obtained utilizing NASCET criteria, using the distal internal carotid diameter as the denominator. CONTRAST:  58mL ISOVUE-370 IOPAMIDOL (ISOVUE-370) INJECTION 76% COMPARISON:  CT head 09/02/2018. MR head 09/03/2018 demonstrated acute and subacute infarcts. FINDINGS: CT HEAD FINDINGS Brain: No evidence for acute infarction, hemorrhage, mass lesion, hydrocephalus, or extra-axial fluid. Slight atrophy. Extensive white matter disease. Chronic LEFT basal ganglia infarct. Acute to subacute infarcts demonstrated on yesterday's MR are poorly visualized on today's study. Vascular: Reported separately. Skull: Intact. Sinuses: Imaged portions are clear. Orbits: No acute finding. Review of the MIP images confirms the above findings CTA NECK FINDINGS Aortic arch: Standard branching. Imaged portion shows no evidence of aneurysm or dissection. No significant stenosis of the major arch vessel origins. Unusually low origin LEFT vertebral from proximal subclavian, with apparent thyrocervical branch arising directly from the proximal vertebral. Right carotid system: No evidence of dissection, stenosis (50% or greater) or occlusion. Left carotid system: No evidence of dissection, stenosis (50% or greater) or occlusion. Vertebral arteries: BILATERAL patent, RIGHT dominant. No significant proximal narrowing. Skeleton: Unremarkable.  Spondylosis. Other neck: Noncontributory. Upper chest: No mass, effusion, or pneumothorax. Review of the MIP images confirms the above findings. CTA HEAD FINDINGS Anterior circulation: No significant stenosis, proximal occlusion, aneurysm, or  vascular malformation. Posterior circulation: No significant stenosis, proximal occlusion, aneurysm, or vascular malformation. Venous sinuses: As permitted by contrast timing, patent. Anatomic variants: LEFT vertebral predominantly contributes to PICA. Delayed phase: No abnormal intracranial enhancement. Review of the MIP images confirms the above findings IMPRESSION: 1. No extracranial or intracranial flow reducing lesion is identified. 2. No abnormal postcontrast enhancement. 3. Non dominant LEFT vertebral primarily supplies PICA. Electronically Signed   By: Staci Righter M.D.   On: 09/04/2018 13:10   Pelvis Portable  Result Date: 09/04/2018 CLINICAL DATA:  Right hip pinning EXAM: PORTABLE PELVIS 1-2 VIEWS COMPARISON:  09/04/2018 FINDINGS: Cannulated screws are noted within the right proximal femur across the femoral neck fracture. Anatomic alignment. No hardware or bony complicating feature. IMPRESSION: Internal fixation across the right femoral neck fracture. No complicating feature. Electronically Signed   By: Rolm Baptise M.D.   On: 09/04/2018 17:29   Dg C-arm 1-60 Min  Result Date: 09/04/2018 CLINICAL DATA:  Right hip fracture repair. Images: 2. FLUOROSCOPY TIME:  49.5 seconds. EXAM: DG C-ARM 61-120 MIN COMPARISON:  None. FINDINGS: Three screws  have been placed across the right femoral neck fracture. IMPRESSION: Three screws have been placed across the right femoral neck fracture. Electronically Signed   By: Dorise Bullion III M.D   On: 09/04/2018 16:58   Dg Hip Operative Unilat W Or W/o Pelvis Right  Result Date: 09/04/2018 CLINICAL DATA:  Right hip fracture repair. Images: 2. FLUOROSCOPY TIME:  49.5 seconds. EXAM: DG C-ARM 61-120 MIN COMPARISON:  None. FINDINGS: Three screws have been placed across the right femoral neck fracture. IMPRESSION: Three screws have been placed across the right femoral neck fracture. Electronically Signed   By: Dorise Bullion III M.D   On: 09/04/2018 16:58    Vas US Carotid (at [place] Only)  Result Date: 09/03/2018 Carotid Arterial Duplex Study Indications: Lacunar infarction. Performing Technologist: Oliver Hum RVT  Examination Guidelines: A complete evaluation includes B-mode imaging, spectral Doppler, color Doppler, and power Doppler as needed of all accessible portions of each vessel. Bilateral testing is considered an integral part of a complete examination. Limited examinations for reoccurring indications may be performed as noted.  Right Carotid Findings: +----------+--------+-------+--------+--------------------------------+--------+           PSV cm/sEDV    StenosisDescribe                        Comments                   cm/s                                                    +----------+--------+-------+--------+--------------------------------+--------+ CCA Prox  91      12             smooth and heterogenous                  +----------+--------+-------+--------+--------------------------------+--------+ CCA Distal70      18             smooth and heterogenous                  +----------+--------+-------+--------+--------------------------------+--------+ ICA Prox  38      10             smooth, heterogenous and                                                  calcific                                 +----------+--------+-------+--------+--------------------------------+--------+ ICA Distal73      15                                             tortuous +----------+--------+-------+--------+--------------------------------+--------+ ECA       99      13                                                      +----------+--------+-------+--------+--------------------------------+--------+ +----------+--------+-------+--------+-------------------+  PSV cm/sEDV cmsDescribeArm Pressure (mmHG) +----------+--------+-------+--------+-------------------+ [id number]                                         +----------+--------+-------+--------+-------------------+ +---------+--------+--+--------+--+---------+ VertebralPSV cm/s44EDV cm/s14Antegrade +---------+--------+--+--------+--+---------+  Left Carotid Findings: +----------+--------+--------+--------+-----------------------+--------+           PSV cm/sEDV cm/sStenosisDescribe               Comments +----------+--------+--------+--------+-----------------------+--------+ CCA Prox  110     11              smooth and heterogenous         +----------+--------+--------+--------+-----------------------+--------+ CCA Distal73      15              smooth and heterogenous         +----------+--------+--------+--------+-----------------------+--------+ ICA Prox  53      12              smooth and heterogenous         +----------+--------+--------+--------+-----------------------+--------+ ICA Distal66      14                                     tortuous +----------+--------+--------+--------+-----------------------+--------+ ECA       88      16                                              +----------+--------+--------+--------+-----------------------+--------+ +----------+--------+--------+--------+-------------------+ SubclavianPSV cm/sEDV cm/sDescribeArm Pressure (mmHG) +----------+--------+--------+--------+-------------------+           118                                         +----------+--------+--------+--------+-------------------+ +---------+--------+--+--------+-+---------+ VertebralPSV cm/s48EDV cm/s7Antegrade +---------+--------+--+--------+-+---------+  Summary: Right Carotid: Velocities in the right ICA are consistent with a 1-39% stenosis. Left Carotid: Velocities in the left ICA are consistent with a 1-39% stenosis. Vertebrals: Bilateral vertebral arteries demonstrate antegrade flow. *See table(s) above for measurements and observations.  Electronically  signed by Deitra Mayo MD on 09/03/2018 at 1:50:48 PM.    Final     Marzetta Board, MD, PhD [facility] Pager [phone]  If 7PM-7AM, please contact night-coverage [email or web address] Password [email or web address] 09/05/2018, 10:58 AM

## 2018-09-05 NOTE — Evaluation (Signed)
Physical Therapy Evaluation Patient Details Name: Matthew Roman MRN: [id number] DOB: Mar 29, 1956 Today's Date: 09/05/2018   History of Present Illness  62 year old male with history of COPD, liver cirrhosis, hypertension, who is being brought to the hospital on 12/12 with confusion, disorientation, progressively worse over the last several weeks per granddaughter.  He has had a fall at the nursing home while visiting the wife, followed by severe right hip pain. was found to have hip fracture, now s/p cannulated hip pinning on 12/14 by Dr. Mardelle Roman, Matthew Roman; Imaging reveals multiple subcentimeter infarcts of mixed age scattered throughout the frontal and parietal white matter, including a prominent subacute infarct in the right medial frontal lobe and acute infarct in the right posterior superior frontal lobe;  It also showed chronic infarcts in multiple other regions with severe chronic microvascular ischemic changes and moderate volume loss of brain for age.  Clinical Impression   Patient is s/p above surgery resulting in functional limitations due to the deficits listed below (see PT Problem List). Independent prior to admission; Presents with expressive difficulties, decr functional mobility, with pain with moving and walking; Overall he is managing very well with RW; Look forward to ST and OT evaluations per stroke protocol; I'm concerned about the dc plan; His wife will be coming home from her rehab stint at SNF soon (unsure of what functional level she will be at); Matthew Roman indicated a daughter or granddaughter can help them at home -- I don't know if 24 hour assist can be arranged, and how much assist they can give;  Patient will benefit from skilled PT to increase their independence and safety with mobility to allow discharge to the venue listed below.       Follow Up Recommendations Home health PT;Supervision - Intermittent    Equipment Recommendations  Rolling walker with 5" wheels;3in1  (PT)    Recommendations for Other Services OT consult;Speech consult(for stroke workup; ordered OT per protocol)     Precautions / Restrictions Precautions Precautions: None Restrictions RLE Weight Bearing: Weight bearing as tolerated      Mobility  Bed Mobility Overal bed mobility: Needs Assistance Bed Mobility: Supine to Sit     Supine to sit: Min guard     General bed mobility comments: Minguard for safety; cues for technique  Transfers Overall transfer level: Needs assistance Equipment used: Rolling walker (2 wheeled) Transfers: Sit to/from Stand Sit to Stand: Min guard         General transfer comment: Cues for hand placement  Ambulation/Gait Ambulation/Gait assistance: Min guard Gait Distance (Feet): 100 Feet Assistive device: Rolling walker (2 wheeled) Gait Pattern/deviations: Step-through pattern Gait velocity: approaching WNL   General Gait Details: Overall walking well with smooth use of RW; Nice, stable RLE in stance; cues to self-monitor for activity tolerance  Stairs Stairs: Yes Stairs assistance: Min guard Stair Management: No rails;Forwards;Backwards;Step to pattern;With walker(Then tried 3-5 steps for practice, used rails, and [name] was able to manage those steps reciprocally with rails) Number of Stairs: 5 General stair comments: Overall managing stairs quite well, including going up and down steps reciprocally with minimal difficulty  Wheelchair Mobility    Modified Rankin (Stroke Patients Only) Modified Rankin (Stroke Patients Only) Pre-Morbid Rankin Score: No symptoms Modified Rankin: Moderate disability(also with acute R hip fracture)     Balance Overall balance assessment: Mild deficits observed, not formally tested  Pertinent Vitals/Pain Pain Assessment: Faces Faces Pain Scale: Hurts little more Pain Location: R hip with bed exercises Pain Descriptors /  Indicators: Guarding Pain Intervention(s): Monitored during session    Home Living Family/patient expects to be discharged to:: Private residence Living Arrangements: Spouse/significant other(to be dc'd home from SNF in next few days) Available Help at Discharge: Other (Comment)(Perhaps Daughter and granddaughter?) Type of Home: House Home Access: Stairs to enter Entrance Stairs-Rails: None Entrance Stairs-Number of Steps: 1 Home Layout: One level Home Equipment: (To be determined) Additional Comments: Will need clearer picture of available assist for pt and his wife at home    Prior Function Level of Independence: Independent               Hand Dominance        Extremity/Trunk Assessment   Upper Extremity Assessment Upper Extremity Assessment: Defer to OT evaluation    Lower Extremity Assessment Lower Extremity Assessment: RLE deficits/detail RLE Deficits / Details: Grossly decr strength in hip postop; Some pain with R hip movement in cardinal planes; Good R stance stability       Communication   Communication: Expressive difficulties  Cognition Arousal/Alertness: Awake/alert Behavior During Therapy: WFL for tasks assessed/performed Overall Cognitive Status: Within Functional Limits for tasks assessed(for simple mobility tasks)                                        General Comments      Exercises Total Joint Exercises Ankle Circles/Pumps: AROM;Both;10 reps Towel Squeeze: AROM;Both;5 reps Heel Slides: AAROM;AROM;Right;10 reps Hip ABduction/ADduction: AROM;Right;10 reps   Assessment/Plan    PT Assessment Patient needs continued PT services  PT Problem List Decreased strength;Decreased activity tolerance;Decreased balance;Decreased mobility;Decreased coordination;Decreased knowledge of use of DME;Decreased knowledge of precautions       PT Treatment Interventions DME instruction;Gait training;Stair training;Functional mobility  training;Therapeutic activities;Therapeutic exercise;Balance training;Patient/family education    PT Goals (Current goals can be found in the Care Plan section)  Acute Rehab PT Goals Patient Stated Goal: Hopes to get home soon PT Goal Formulation: With patient Time For Goal Achievement: 09/19/18 Potential to Achieve Goals: Good    Frequency Min 6X/week   Barriers to discharge Decreased caregiver support;Other (comment) Pt's wife has been at SNF for rehab, and will be dc'ing home in a few days (unsure of her functional status; Pt mentioned a daughter and granddaughter can help, but that will need to be verified.    Co-evaluation               AM-PAC PT "6 Clicks" Mobility  Outcome Measure Help needed turning from your back to your side while in a flat bed without using bedrails?: A Little Help needed moving from lying on your back to sitting on the side of a flat bed without using bedrails?: None Help needed moving to and from a bed to a chair (including a wheelchair)?: A Little Help needed standing up from a chair using your arms (e.g., wheelchair or bedside chair)?: A Little Help needed to walk in hospital room?: None Help needed climbing 3-5 steps with a railing? : None 6 Click Score: 21    End of Session Equipment Utilized During Treatment: Gait belt Activity Tolerance: Patient tolerated treatment well Patient left: in chair;with call bell/phone within reach;with chair alarm set Nurse Communication: Mobility status PT Visit Diagnosis: Unsteadiness on feet (R26.81);Other abnormalities of gait and mobility (  R26.89);Other symptoms and signs involving the nervous system (R29.898)    Time: 2583-4621 PT Time Calculation (min) (ACUTE ONLY): 34 min   Charges:   PT Evaluation $PT Eval Moderate Complexity: 1 Mod PT Treatments $Gait Training: 8-22 mins        Roney Marion, PT  Acute Rehabilitation Services Pager [phone] Office [place] 09/05/2018, 11:39 AM

## 2018-09-05 NOTE — Progress Notes (Signed)
2135 Pt states that he doesn't "feel good." Alert and oriented X2. VS WDL; No chest pain, no headache, no SOB. Pt is negative for the signs of stroke (check assessment) Rapid Response team is at the bedside. Pt is having hard time to find the words to describe what exactly doesn't "feel right."  Pt is nauseous. Zofran IV is given. RRT and RN educated  pt and family about post-op care. Nursing will continue to monitor.  49 Pt  States that "feels better." Pt is not complaining of any type pain. Ice pack is applied to right hip.  2315 Pt's VS WDL. Pain med is given. Nursing will continue to monitor.

## 2018-09-05 NOTE — Plan of Care (Signed)
  Problem: Education: Goal: Knowledge of General Education information will improve Description Including pain rating scale, medication(s)/side effects and non-pharmacologic comfort measures Outcome: Progressing   Problem: Health Behavior/Discharge Planning: Goal: Ability to manage health-related needs will improve Outcome: Progressing   Problem: Clinical Measurements: Goal: Ability to maintain clinical measurements within normal limits will improve Outcome: Progressing Goal: Will remain free from infection Outcome: Progressing Goal: Diagnostic test results will improve Outcome: Progressing Goal: Respiratory complications will improve Outcome: Progressing Goal: Cardiovascular complication will be avoided Outcome: Progressing   Problem: Activity: Goal: Risk for activity intolerance will decrease Outcome: Progressing   Problem: Nutrition: Goal: Adequate nutrition will be maintained Outcome: Progressing   Problem: Coping: Goal: Level of anxiety will decrease Outcome: Progressing   Problem: Elimination: Goal: Will not experience complications related to bowel motility Outcome: Progressing Goal: Will not experience complications related to urinary retention Outcome: Progressing   Problem: Safety: Goal: Ability to remain free from injury will improve Outcome: Progressing   Problem: Skin Integrity: Goal: Risk for impaired skin integrity will decrease Outcome: Progressing   Problem: Education: Goal: Knowledge of disease or condition will improve Outcome: Progressing Goal: Knowledge of secondary prevention will improve Outcome: Progressing

## 2018-09-05 NOTE — Progress Notes (Signed)
STROKE TEAM PROGRESS NOTE   SUBJECTIVE (INTERVAL HISTORY) His RN is at the bedside. Pt had surgery yesterday and doing well. Able to walk with PT in the hallway. Still has disorientation to time and hesitation of speech under stress and anxiety. LE venous doppler no DVT.     OBJECTIVE Vitals:   09/04/18 1845 09/04/18 2137 09/04/18 2314 09/05/18 0439  BP:  (!) 171/97 (!) 159/82 139/79  Pulse:  71 63 (!) 50  Resp:  20 18   Temp:  98 F (36.7 C)  98.4 F (36.9 C)  TempSrc:  Oral  Oral  SpO2: 95% 95% 93% 96%  Weight:      Height:        CBC:  Recent Labs  Lab 09/02/18 1844 09/04/18 0245 09/05/18 0403  WBC 8.6 7.0 7.0  NEUTROABS 6.8  --   --   HGB 16.1 15.7 14.4  HCT 50.1 48.0 43.9  MCV 88.2 88.1 87.1  PLT 90* 78* 77*    Basic Metabolic Panel:  Recent Labs  Lab 09/04/18 0245 09/05/18 0403  NA 142 140  K 3.5 3.7  CL 106 104  CO2 24 23  GLUCOSE 112* 144*  BUN 11 18  CREATININE 1.00 1.01  CALCIUM 9.1 8.6*    Lipid Panel:     Component Value Date/Time   CHOL 179 09/04/2018 0245   TRIG 106 09/04/2018 0245   HDL 30 (L) 09/04/2018 0245   CHOLHDL 6.0 09/04/2018 0245   VLDL 21 09/04/2018 0245   LDLCALC 128 (H) 09/04/2018 0245   HgbA1c:  Lab Results  Component Value Date   HGBA1C 5.3 09/04/2018   Urine Drug Screen:     Component Value Date/Time   LABOPIA NONE DETECTED 09/02/2018 2009   COCAINSCRNUR NONE DETECTED 09/02/2018 2009   LABBENZ NONE DETECTED 09/02/2018 2009   AMPHETMU NONE DETECTED 09/02/2018 2009   THCU NONE DETECTED 09/02/2018 2009   LABBARB NONE DETECTED 09/02/2018 2009    Alcohol Level     Component Value Date/Time   ETH <10 09/02/2018 1845    IMAGING  Ct Head Wo Contrast 09/02/2018 IMPRESSION:  1. Small age indeterminate lacunar infarcts within the thalamus and right basal ganglia.  2. 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 Atrophy and small vessel ischemic changes of the white matter.    Mr Brain Wo  Contrast 09/03/2018 IMPRESSION:  1. Multiple subcentimeter infarcts of mixed age scattered throughout the frontal and parietal white matter including a prominent subacute infarct in the right medial frontal lobe and acute infarct in the right posterosuperior frontal lobe. No associated acute hemorrhage or mass effect.  2. Chronic infarct of left lentiform nucleus, thalamus, and cerebral peduncle. Small chronic lacunar infarcts of the thalami.  3. Severe chronic microvascular ischemic changes and moderate volume loss of the brain for age.    Ct Abdomen Pelvis W Contrast 09/02/2018 IMPRESSION:  1. No CT evidence for acute intra-abdominal or pelvic abnormality.  2. Subtle contour nodularity of the liver suggesting cirrhosis. The spleen appears slightly enlarged  3. Findings suspicious for acute mildly impacted right subcapital femoral neck fracture.  4. Nonobstructing stones in the right kidney    Pelvis Portable  Result Date: 09/04/2018 CLINICAL DATA:  Right hip pinning EXAM: PORTABLE PELVIS 1-2 VIEWS COMPARISON:  09/04/2018 FINDINGS: Cannulated screws are noted within the right proximal femur across the femoral neck fracture. Anatomic alignment. No hardware or bony complicating feature. IMPRESSION: Internal fixation across the right femoral neck fracture. No  complicating feature. Electronically Signed   By: Rolm Baptise M.D.   On: 09/04/2018 17:29   Dg C-arm 1-60 Min  Result Date: 09/04/2018 CLINICAL DATA:  Right hip fracture repair. Images: 2. FLUOROSCOPY TIME:  49.5 seconds. EXAM: DG C-ARM 61-120 MIN COMPARISON:  None. FINDINGS: Three screws have been placed across the right femoral neck fracture. IMPRESSION: Three screws have been placed across the right femoral neck fracture. Electronically Signed   By: Dorise Bullion III M.D   On: 09/04/2018 16:58   Dg Hip Operative Unilat W Or W/o Pelvis Right  Result Date: 09/04/2018 CLINICAL DATA:  Right hip fracture repair. Images: 2.  FLUOROSCOPY TIME:  49.5 seconds. EXAM: DG C-ARM 61-120 MIN COMPARISON:  None. FINDINGS: Three screws have been placed across the right femoral neck fracture. IMPRESSION: Three screws have been placed across the right femoral neck fracture. Electronically Signed   By: Dorise Bullion III M.D   On: 09/04/2018 16:58   Vas [place] Lower Extremity Venous (dvt)  Result Date: 09/05/2018  Lower Venous Study Indications: Stroke.  Performing Technologist: Abram Sander RVS  Examination Guidelines: A complete evaluation includes B-mode imaging, spectral Doppler, color Doppler, and power Doppler as needed of all accessible portions of each vessel. Bilateral testing is considered an integral part of a complete examination. Limited examinations for reoccurring indications may be performed as noted.  Right Venous Findings: +---------+---------------+---------+-----------+----------+-------+          CompressibilityPhasicitySpontaneityPropertiesSummary +---------+---------------+---------+-----------+----------+-------+ CFV      Full           Yes      Yes                          +---------+---------------+---------+-----------+----------+-------+ SFJ      Full                                                 +---------+---------------+---------+-----------+----------+-------+ FV Prox  Full                                                 +---------+---------------+---------+-----------+----------+-------+ FV Mid   Full                                                 +---------+---------------+---------+-----------+----------+-------+ FV DistalFull                                                 +---------+---------------+---------+-----------+----------+-------+ PFV      Full                                                 +---------+---------------+---------+-----------+----------+-------+ POP      Full           Yes      Yes                           +---------+---------------+---------+-----------+----------+-------+  PTV      Full                                                 +---------+---------------+---------+-----------+----------+-------+ PERO     Full                                                 +---------+---------------+---------+-----------+----------+-------+  Left Venous Findings: +---------+---------------+---------+-----------+----------+-------+          CompressibilityPhasicitySpontaneityPropertiesSummary +---------+---------------+---------+-----------+----------+-------+ CFV      Full           Yes      Yes                          +---------+---------------+---------+-----------+----------+-------+ SFJ      Full                                                 +---------+---------------+---------+-----------+----------+-------+ FV Prox  Full                                                 +---------+---------------+---------+-----------+----------+-------+ FV Mid   Full                                                 +---------+---------------+---------+-----------+----------+-------+ FV DistalFull                                                 +---------+---------------+---------+-----------+----------+-------+ PFV      Full                                                 +---------+---------------+---------+-----------+----------+-------+ POP      Full           Yes      Yes                          +---------+---------------+---------+-----------+----------+-------+ PTV      Full                                                 +---------+---------------+---------+-----------+----------+-------+ PERO     Full                                                 +---------+---------------+---------+-----------+----------+-------+  Summary: Right: There is no evidence of deep vein thrombosis in the lower extremity. No cystic structure found in the popliteal  fossa. Left: There is no evidence of deep vein thrombosis in the lower extremity. No cystic structure found in the popliteal fossa.  *See table(s) above for measurements and observations. Electronically signed by Deitra Mayo MD on 09/05/2018 at 1:51:02 PM.    Final     Dg Hip Unilat With Pelvis 2-3 Views Right 09/02/2018 IMPRESSION:  Nondisplaced right femoral neck fracture.  No dislocation.    Vas US Carotid (at [place] Only) 09/03/2018 Summary:  Right Carotid: Velocities in the right ICA are consistent with a 1-39% stenosis.  Left Carotid: Velocities in the left ICA are consistent with a 1-39% stenosis.  Vertebrals: Bilateral vertebral arteries demonstrate antegrade flow.    Transthoracic Echocardiogram  09/03/2018 Study Conclusions - Left ventricle: The cavity size was normal. Systolic function was   normal. The estimated ejection fraction was in the range of 50% to 55%. - Atrial septum: No defect or patent foramen ovale was identified. Impressions: - Extremely limited views; essentially no parasternal windows and   only minimal apical windows. Most information from subcostal   views, and use of echo contrast did not significantly improve   imaging.   Grossly normal LVEF. Cannot determine wall motion. Unable to see   most valvular structures.   Bilateral LE Venous  Dopplers  09/05/2018 No evidence of Rt or Lt DVT.    PHYSICAL EXAM  Temp:  [97.3 F (36.3 C)-98.4 F (36.9 C)] 98.4 F (36.9 C) (12/15 0439) Pulse Rate:  [50-78] 50 (12/15 0439) Resp:  [14-35] 18 (12/14 2314) BP: (139-186)/(79-111) 139/79 (12/15 0439) SpO2:  [91 %-98 %] 96 % (12/15 0439)  General - Well nourished, well developed, in no apparent distress.  Ophthalmologic - fundi not visualized due to noncooperation.  Cardiovascular - Regular rate and rhythm.  Mental Status -  Level of arousal and orientation to place, and person were intact, but not to time. Language including naming,  repetition, comprehension was assessed and found intact. However, pt has hesitation of speech associated with anxiety and frustration. No dysarthria  Cranial Nerves II - XII - II - Visual field intact OU. III, IV, VI - Extraocular movements intact. V - Facial sensation intact bilaterally. VII - right nasolabial fold flattening. VIII - Hearing & vestibular intact bilaterally. X - Palate elevates symmetrically. XI - Chin turning & shoulder shrug intact bilaterally. XII - Tongue protrusion intact.  Motor Strength - The patient's strength was normal in all extremities and pronator drift was absent.  Bulk was normal and fasciculations were absent.   Motor Tone - Muscle tone was assessed at the neck and appendages and was normal.  Reflexes - The patient's reflexes were symmetrical in all extremities and he had no pathological reflexe on the left.  Sensory - Light touch, temperature/pinprick were assessed and were symmetrical.    Coordination - The patient had normal movements in the hands with no ataxia or dysmetria.  Tremor was absent.  Gait and Station - deferred.    ASSESSMENT/PLAN Mr. Geoff Dacanay is a 62 y.o. male with history of HTN, DM, COPD, cirrhosis and hepatitis presenting with AMS, speech difficulties after a mechanical fall. He did not receive IV t-PA due to late presentation.   Strokes:  Multiple punctate infarcts at right MCA/ACA territory, embolic pattern, etiology unclear, fat emboli vs cardioembolic source  Resultant speech hesitancy  CT head - Small  chronic lacunar infarcts within the thalamus and right basal ganglia.   MRI head - Multiple subcentimeter infarcts of mixed age scattered throughout the frontal and parietal white matter. Multiple old infarcts.  CTA H&N - unremarkable  Carotid Doppler  - unremarkable  2D Echo - EF 50 - 55%. No cardiac source of emboli identified.   LE venous doppler - no DVT  Recommend 30 day cardiac event monitoring as outpt to  rule out afib  LDL - 128  HgbA1c - 5.3  UDS  - unremarkable  VTE prophylaxis - NPO  Diet - SCDs  No antithrombotic prior to admission, now on aspirin 81 mg daily.   Patient counseled to be compliant with his antithrombotic medications  Ongoing aggressive stroke risk factor management  Therapy recommendations:  pending  Disposition:  Pending  Right hip fracture  After mechanical fall  Hip X-ray showed nondisplaced right hip fracture  Orthopedic surgery on board  S/p Pinning of Rt non displaced femoral neck fx 09/04/18  Doing well and able to walk with PT  Hypertension  Stable . Long-term BP goal normotensive  Hyperlipidemia  Lipid lowering medication PTA:  none  LDL 128, goal < 70  AST and ALT normal range  Current lipid lowering medication: Lipitor 20 mg daily   Continue statin at discharge  Diabetes  HgbA1c 5.3, goal < 7.0  Controlled  SSI  CBG monitoring  Tobacco abuse  Current smoker  Smoking cessation counseling provided  Pt is willing to quit  Other Stroke Risk Factors  Advanced age  Hx stroke/TIA by imaging  Other Active Problems  COPD  Cirrhosis on CT abdomen  Thrombocytopenia - platelet 78->77   Hospital day # 3  Neurology will sign off. Please call with questions. Pt will follow up with stroke clinic NP at [facility] in about 4 weeks. Thanks for the consult.   Rosalin Hawking, MD PhD Stroke Neurology 09/05/2018 3:47 PM     To contact Stroke Continuity provider, please refer to [email or web address] After hours, contact General Neurology

## 2018-09-05 NOTE — Progress Notes (Signed)
Pt up ambulating in hallway with rolling walker, standby assist with pt. Alert, able to describe circumstances of his fall.  Pt educated on need to call for assist when standing. Pt verbalized understanding of teaching. Pt sitting up in chair, providing his own denture care.

## 2018-09-06 DIAGNOSIS — D696 Thrombocytopenia, unspecified: Secondary | ICD-10-CM

## 2018-09-06 DIAGNOSIS — Z9889 Other specified postprocedural states: Secondary | ICD-10-CM

## 2018-09-06 DIAGNOSIS — R4182 Altered mental status, unspecified: Secondary | ICD-10-CM

## 2018-09-06 DIAGNOSIS — Z8781 Personal history of (healed) traumatic fracture: Secondary | ICD-10-CM

## 2018-09-06 LAB — CBC
HCT: 44.7 % (ref 39.0–52.0)
Hemoglobin: 14.7 g/dL (ref 13.0–17.0)
MCH: 29.2 pg (ref 26.0–34.0)
MCHC: 32.9 g/dL (ref 30.0–36.0)
MCV: 88.9 fL (ref 80.0–100.0)
Platelets: 76 10*3/uL — ABNORMAL LOW (ref 150–400)
RBC: 5.03 MIL/uL (ref 4.22–5.81)
RDW: 12.8 % (ref 11.5–15.5)
WBC: 7.6 10*3/uL (ref 4.0–10.5)
nRBC: 0 % (ref 0.0–0.2)

## 2018-09-06 LAB — BASIC METABOLIC PANEL
Anion gap: 10 (ref 5–15)
BUN: 18 mg/dL (ref 8–23)
CO2: 27 mmol/L (ref 22–32)
Calcium: 8.7 mg/dL — ABNORMAL LOW (ref 8.9–10.3)
Chloride: 107 mmol/L (ref 98–111)
Creatinine, Ser: 0.93 mg/dL (ref 0.61–1.24)
GFR calc Af Amer: >60 mL/min (ref >60)
GFR calc non Af Amer: >60 mL/min (ref >60)
Glucose, Bld: 146 mg/dL — ABNORMAL HIGH (ref 70–99)
POTASSIUM: 3.8 mmol/L (ref 3.5–5.1)
Sodium: 144 mmol/L (ref 135–145)

## 2018-09-06 LAB — EXTRACTABLE NUCLEAR ANTIGEN ANTIBODY
ENA SM Ab Ser-aCnc: <0.2 AI (ref 0.0–0.9)
Ribonucleic Protein: 0.2 AI (ref 0.0–0.9)
SSA (Ro) (ENA) Antibody, IgG: <0.2 AI (ref 0.0–0.9)
SSB (La) (ENA) Antibody, IgG: <0.2 AI (ref 0.0–0.9)
Scleroderma (Scl-70) (ENA) Antibody, IgG: <0.2 AI (ref 0.0–0.9)

## 2018-09-06 LAB — ANTI-JO 1 ANTIBODY, IGG: Anti JO-1: <0.2 AI (ref 0.0–0.9)

## 2018-09-06 NOTE — Evaluation (Signed)
Speech Language Pathology Evaluation Patient Details Name: Zealand Boyett MRN: [id number] DOB: March 01, 1956 Today's Date: 09/06/2018 Time: 2637-8588 SLP Time Calculation (min) (ACUTE ONLY): 15 min  Problem List:  Patient Active Problem List   Diagnosis Date Noted  . Acute encephalopathy 09/02/2018  . Multiple lacunar infarcts ([place]) 09/02/2018  . Fracture of femoral neck, right, closed ([place]) 09/02/2018  . TOBACCO DEPENDENCE 11/19/2006  . BACK PAIN, LOW 11/19/2006   Past Medical History:  Past Medical History:  Diagnosis Date  . Cirrhosis ([place])   . COPD (chronic obstructive pulmonary disease) ([place])   . Diabetes mellitus without complication ([place])   . Hepatitis   . Hypertension    Past Surgical History:  Past Surgical History:  Procedure Laterality Date  . BACK SURGERY    . KNEE SURGERY    . SHOULDER SURGERY     HPI:  62 year old male with history of COPD, liver cirrhosis, hypertension, who is being brought to the hospital on 12/12 with confusion, disorientation, progressively worse over the last several weeks per granddaughter.  He has had a fall at the nursing home while visiting the wife, followed by severe right hip pain. was found to have hip fracture, now s/p cannulated hip pinning on 12/14 by Dr. 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   Assessment / Plan / Recommendation Clinical Impression  Pt presents with moderate expressive aphasia c/b word finding deficits during unstructured activities with perseveration of words/phrases. Semantic cues and sentence completion didn't help with these deficits. Pt  was able to answer yes/no question accurately when related to himself and current situation. Additionally, pt with deficits in sustained attention and overall social appropriateness. Skilled ST is required to target these deficits to aid pt in effectively communicating wants and needs. Recommend ST at next venue of care.     SLP Assessment  SLP Recommendation/Assessment: Patient needs continued Speech Lanaguage Pathology Services SLP Visit Diagnosis: Aphasia (R47.01);Cognitive communication deficit (R41.841)    Follow Up Recommendations  Skilled Nursing facility    Frequency and Duration min 2x/week  2 weeks      SLP Evaluation Cognition  Overall Cognitive Status: Impaired/Different from baseline Arousal/Alertness: Awake/alert Orientation Level: Oriented to person;Oriented to situation Attention: Sustained Sustained Attention: Impaired Sustained Attention Impairment: Verbal complex;Functional complex Memory: (difficult to assess d/t word finding deficits) Awareness: Appears intact Problem Solving: Appears intact Behaviors: Restless;Impulsive;Perseveration;Poor frustration tolerance Safety/Judgment: Impaired       Comprehension  Auditory Comprehension Overall Auditory Comprehension: Appears within functional limits for tasks assessed Yes/No Questions: Within Functional Limits Commands: Within Functional Limits(simple) Conversation: Simple Interfering Components: Attention;Pain EffectiveTechniques: Repetition Environmental consultant Discrimination: Not tested Reading Comprehension Reading Status: Not tested    Expression Expression Primary Mode of Expression: Verbal Verbal Expression Overall Verbal Expression: Impaired Initiation: Impaired Automatic Speech: Name;Month of year Level of Generative/Spontaneous Verbalization: Phrase Repetition: No impairment Naming: Impairment Confrontation: Within functional limits Verbal Errors: Perseveration;Aware of  errors Pragmatics: Impairment Impairments: Topic appropriateness;Topic maintenance;Interpretation of nonverbal communication Non-Verbal Means of Communication: Not applicable Written Expression Dominant Hand: Right Written Expression: Not tested   Oral / Motor  Oral Motor/Sensory Function Overall Oral Motor/Sensory Function: Within functional limits Motor Speech Overall Motor  Speech: Appears within functional limits for tasks assessed Respiration: Within functional limits Phonation: Normal Resonance: Within functional limits Articulation: Within functional limitis Intelligibility: Intelligible Motor Planning: Witnin functional limits Motor Speech Errors: Not applicable   GO                    Bowen Goyal 09/06/2018, 4:51 PM

## 2018-09-06 NOTE — NC FL2 (Signed)
[place] MEDICAID FL2 LEVEL OF CARE SCREENING TOOL     IDENTIFICATION  Patient Name: Matthew Roman Birthdate: 22-Jun-1956 Sex: male Admission Date (Current Location): 09/02/2018  [facility] and [place] Number:  Herbalist and Address:  [facility], [place] [address] [place], [place] [address]      Provider Number: [id number]  Attending Physician Name and Address:  Caren Griffins, MD  Relative Name and Phone Number:  Nira Conn (MPNTIRWE)[phone]    Current Level of Care: Hospital Recommended Level of Care: [place] Prior Approval Number:    Date Approved/Denied: 09/06/18 PASRR Number: [id number]  Discharge Plan: SNF    Current Diagnoses: Patient Active Problem List   Diagnosis Date Noted  . Acute encephalopathy 09/02/2018  . Multiple lacunar infarcts ([place]) 09/02/2018  . Fracture of femoral neck, right, closed ([place]) 09/02/2018  . TOBACCO DEPENDENCE 11/19/2006  . BACK PAIN, LOW 11/19/2006    Orientation RESPIRATION BLADDER Height & Weight     Self, Place  Normal Continent, External catheter Weight: 86.2 kg Height:  6' (182.9 cm)  BEHAVIORAL SYMPTOMS/MOOD NEUROLOGICAL BOWEL NUTRITION STATUS      Continent Diet(see discharge summary)  AMBULATORY STATUS COMMUNICATION OF NEEDS Skin   Limited Assist Verbally Surgical wounds(right hip closed surgical incision)                       Personal Care Assistance Level of Assistance  Bathing, Feeding, Dressing, Total care Bathing Assistance: Limited assistance Feeding assistance: Independent Dressing Assistance: Limited assistance Total Care Assistance: Limited assistance   Functional Limitations Info  Sight, Hearing, Speech Sight Info: Adequate Hearing Info: Adequate      SPECIAL CARE FACTORS FREQUENCY  PT (By licensed PT), OT (By licensed OT)     PT Frequency: min 5x weekly OT Frequency: min 5x weekly            Contractures Contractures Info: Not  present    Additional Factors Info  Code Status, Allergies Code Status Info: full Allergies Info: no known allergies           Current Medications (09/06/2018):  This is the current hospital active medication list Current Facility-Administered Medications  Medication Dose Route Frequency Provider Last Rate Last Dose  . acetaminophen (TYLENOL) tablet 325-650 mg  325-650 mg Oral Q6H PRN Marchia Bond, MD      . alum & mag hydroxide-simeth (MAALOX/MYLANTA) 200-200-20 MG/5ML suspension 30 mL  30 mL Oral Q4H PRN Marchia Bond, MD      . atorvastatin (LIPITOR) tablet 20 mg  20 mg Oral q1800 Marchia Bond, MD   20 mg at 09/05/18 1717  . bisacodyl (DULCOLAX) suppository 10 mg  10 mg Rectal Daily PRN Marchia Bond, MD      . docusate sodium (COLACE) capsule 100 mg  100 mg Oral BID Marchia Bond, MD   100 mg at 09/05/18 2118  . enoxaparin (LOVENOX) injection 40 mg  40 mg Subcutaneous Q24H Marchia Bond, MD   40 mg at 09/06/18 0851  . feeding supplement (ENSURE ENLIVE) (ENSURE ENLIVE) liquid 237 mL  237 mL Oral TID BM Marchia Bond, MD   237 mL at 09/06/18 0851  . ferrous sulfate tablet 325 mg  325 mg Oral TID Emiliano Dyer, MD   325 mg at 09/06/18 0851  . HYDROcodone-acetaminophen (NORCO) 7.5-325 MG per tablet 1-2 tablet  1-2 tablet Oral Q4H PRN Marchia Bond, MD   2 tablet at 09/05/18 1717  .  HYDROcodone-acetaminophen (NORCO/VICODIN) 5-325 MG per tablet 1-2 tablet  1-2 tablet Oral Q4H PRN Marchia Bond, MD   2 tablet at 09/06/18 1102  . magnesium citrate solution 1 Bottle  1 Bottle Oral Once PRN Marchia Bond, MD      . menthol-cetylpyridinium (CEPACOL) lozenge 3 mg  1 lozenge Oral PRN Marchia Bond, MD       Or  . phenol (CHLORASEPTIC) mouth spray 1 spray  1 spray Mouth/Throat PRN Marchia Bond, MD      . metoCLOPramide (REGLAN) tablet 5-10 mg  5-10 mg Oral Q8H PRN Marchia Bond, MD       Or  . metoCLOPramide (REGLAN) injection 5-10 mg  5-10 mg Intravenous Q8H PRN Marchia Bond, MD      . morphine 2 MG/ML injection 0.5-1 mg  0.5-1 mg Intravenous Q2H PRN Marchia Bond, MD      . multivitamin with minerals tablet 1 tablet  1 tablet Oral Daily Marchia Bond, MD   1 tablet at 09/06/18 [phone]  . ondansetron (ZOFRAN) tablet 4 mg  4 mg Oral Q6H PRN Marchia Bond, MD       Or  . ondansetron [facility]) injection 4 mg  4 mg Intravenous Q6H PRN Marchia Bond, MD   4 mg at 09/04/18 2154  . polyethylene glycol (MIRALAX / GLYCOLAX) packet 17 g  17 g Oral Daily PRN Marchia Bond, MD      . pregabalin (LYRICA) capsule 150 mg  150 mg Oral BID Marchia Bond, MD   150 mg at 09/06/18 6122     Discharge Medications: Please see discharge summary for a list of discharge medications.  Relevant Imaging Results:  Relevant Lab Results:   Additional Information SSN: [id number]  Alberteen Sam, LCSW

## 2018-09-06 NOTE — Plan of Care (Signed)
  Problem: Safety: Goal: Ability to remain free from injury will improve Outcome: Progressing   

## 2018-09-06 NOTE — Progress Notes (Addendum)
Patient ID: Matthew Roman, male   DOB: 28-Nov-1955, 62 y.o.   MRN: [id number]     Subjective:  Patient reports pain as mild.  Patient in bed and in no acute distress.  Denies any CP or SOB  Objective:   VITALS:   Vitals:   09/05/18 0439 09/05/18 1539 09/05/18 2056 09/06/18 0555  BP: 139/79 138/61 (!) 158/76 (!) 176/78  Pulse: (!) 50 (!) 58 61 (!) 54  Resp:      Temp: 98.4 F (36.9 C)  98.4 F (36.9 C) 98.1 F (36.7 C)  TempSrc: Oral Oral Oral Oral  SpO2: 96% 94% 100% 97%  Weight:      Height:        ABD soft Sensation intact distally Dorsiflexion/Plantar flexion intact Incision: dressing C/D/I and no drainage   Lab Results  Component Value Date   WBC 7.6 09/06/2018   HGB 14.7 09/06/2018   HCT 44.7 09/06/2018   MCV 88.9 09/06/2018   PLT 76 (L) 09/06/2018   BMET    Component Value Date/Time   NA 144 09/06/2018 0302   K 3.8 09/06/2018 0302   CL 107 09/06/2018 0302   CO2 27 09/06/2018 0302   GLUCOSE 146 (H) 09/06/2018 0302   BUN 18 09/06/2018 0302   CREATININE 0.93 09/06/2018 0302   CALCIUM 8.7 (L) 09/06/2018 0302   GFRNONAA >60 09/06/2018 0302   GFRAA >60 09/06/2018 0302     Assessment/Plan: 2 Days Post-Op   Principal Problem:   Fracture of femoral neck, right, closed ([place]) Active Problems:   Acute encephalopathy   Multiple lacunar infarcts (H[facility])   Advance diet Up with therapy Continue plan per medicine WBAT Dry dressing PRN   Lunette Stands 09/06/2018, 12:14 PM  Discussed and agree with above.  WBAT, lovenox for 1 month, resumed baseline oxycodone.   Scripts written and signed.   Marchia Bond, MD Cell [phone]

## 2018-09-06 NOTE — Clinical Social Work Note (Signed)
Clinical Social Work Assessment  Patient Details  Name: Matthew Roman MRN: [id number] Date of Birth: 1956/05/10  Date of referral:  09/06/18               Reason for consult:  Discharge Planning                Permission sought to share information with:  Case Manager, Facility Sport and exercise psychologist, Family Supports Permission granted to share information::  Yes, Verbal Permission Granted  Name::     Retail buyer::  SNFs  Relationship::  daughter  Contact Information:  [phone]  Housing/Transportation Living arrangements for the past 2 months:  [place] of Information:  Patient Patient Interpreter Needed:  None Criminal Activity/Legal Involvement Pertinent to Current Situation/Hospitalization:  No - Comment as needed Significant Relationships:  Spouse, Adult Children Lives with:  Self Do you feel safe going back to the place where you live?  No Need for family participation in patient care:  Yes (Comment)  Care giving concerns:  CSW received referral for possible SNF placement at time of discharge. Spoke with patient regarding possibility of SNF placement . Patient's  family  is currently unable to care for him at their home given patient's current needs and fall risk. Patient's wife is in [facility] SNF and he would like to join her due to lack of support at home. Patient expressed understanding of PT recommendation and are agreeable to SNF placement at time of discharge. CSW to continue to follow and assist with discharge planning needs.    Social Worker assessment / plan:  Spoke with patient concerning possibility of rehab at SNF before returning home.    Employment status:  Retired Forensic scientist:  Medicare PT Recommendations:  [place] / Referral to community resources:  [place]  Patient/Family's Response to care:  Patient     recognize need for rehab before returning home and are agreeable to  a SNF in [place] with preference for [facility] where his wife is currently staying. CSW provided medicare.gov ratings sheet to patient of [facility] with copy put in chart. CSW explained insurance authorization process. Patient's family reported that they want patient to get stronger to be able to come back home.    Patient/Family's Understanding of and Emotional Response to Diagnosis, Current Treatment, and Prognosis:  Patient/family is realistic regarding therapy needs and expressed being hopeful for SNF placement. Patient expressed understanding of CSW role and discharge process as well as medical condition. No questions/concerns about plan or treatment.    Emotional Assessment Appearance:  Appears stated age Attitude/Demeanor/Rapport:  Engaged, Gracious Affect (typically observed):  Accepting, Adaptable Orientation:  Oriented to Self, Oriented to Place Alcohol / Substance use:  Not Applicable Psych involvement (Current and /or in the community):  No (Comment)  Discharge Needs  Concerns to be addressed:  Discharge Planning Concerns Readmission within the last 30 days:  No Current discharge risk:  Dependent with Mobility Barriers to Discharge:  Continued Medical Work up   [employer], LCSW 09/06/2018, 12:00 PM

## 2018-09-06 NOTE — Progress Notes (Signed)
PROGRESS NOTE  Matthew Roman EQA:[id number] DOB: 1956/06/09 DOA: 09/02/2018 PCP: Patient, No Pcp Per   LOS: 4 days   Brief Narrative / Interim history: 62 year old male with history of COPD, liver cirrhosis, hypertension, who is being brought to the hospital on 12/12 with confusion, disorientation, progressively worse over the last several weeks per granddaughter.  He has had a fall at the nursing home while visiting the wife, followed by severe right hip pain.  He was brought to the ED, was found to have hip fracture and was admitted to the hospital.  Subjective: -Feeling better, stronger, denies any chest pain, denies any shortness of breath Assessment & Plan: Principal Problem:   Fracture of femoral neck, right, closed ([place]) Active Problems:   Acute encephalopathy   Multiple lacunar infarcts ([place])   Principal Problem Acute CVA -CT scan initially in the ED showed small age-indeterminate lacunar infarct within the thalamus and right basal ganglia -This was followed by an MRI today which showed multiple subcentimeter infarcts of mixed age scattered throughout the frontal and parietal white matter, including a prominent subacute infarct in the right medial frontal lobe and acute infarct in the right posterior superior frontal lobe without associated hemorrhage or mass-effect.  It also showed chronic infarcts in multiple other regions with severe chronic microvascular ischemic changes and moderate volume loss of brain for age. -undergoing stroke work-up, hemoglobin A1c was done and was 5.3, lipid panel showed an LDL of 113, will start statin. -2D echo with limited views but normal EF -CT angiogram head and neck without significant occlusion -Continue aspirin, neurology recommends 30-day cardiac event monitoring as an outpatient to rule out A. 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  Discussed with Education officer, museum.  Additional Problems Right femoral neck fracture -Orthopedics consulted, status post cannulated hip pinning on 12/14 by Dr. Mardelle Matte -They recommend Lovenox for a month for prophylaxis  Acute on chronic metabolic encephalopathy -Likely in the setting of #1, patient alert and oriented on my evaluation, likely not true encephalopathy but expressive aphasia makes communication difficult and may appear that patient is encephalopathic. -Alert and oriented x4, appropriate however tangential at times  Possible liver cirrhosis -CT scan of the abdomen pelvis done on 12/12 showed subtle contour nodularity of the liver suggesting cirrhosis with splenomegaly -Per prior reports he has a history of hep C that was treated in 2017.  Also has a history of alcohol abuse. -Thrombocytopenia stable without evidence of bleed  COPD/emphysema -In the setting of tobacco use, no wheezing, not an active issue -Patient willing to quit, discussed today  Colon cancer -Status post a routine screening colonoscopy in 03/2017, 1 of the polyps removed was malignant.  Evaluated by general surgery as an outpatient, and there is a possibility that his cancer was completely removed during the polypectomy and after discussion with the patient he did not wanted surgery but chose for observation and follow-up.  Continue outpatient management   Scheduled Meds: . atorvastatin  20 mg Oral q1800  . docusate sodium  100 mg Oral BID  . enoxaparin (LOVENOX) injection  40 mg Subcutaneous Q24H  . feeding supplement (ENSURE ENLIVE)  237 mL Oral TID BM  . ferrous sulfate  325 mg Oral TID PC  . multivitamin with minerals  1 tablet Oral Daily  . pregabalin  150 mg Oral BID   Continuous Infusions:  PRN Meds:.acetaminophen, alum & mag hydroxide-simeth, bisacodyl, HYDROcodone-acetaminophen, HYDROcodone-acetaminophen, magnesium citrate,  menthol-cetylpyridinium **OR** phenol, metoCLOPramide **OR** metoCLOPramide (REGLAN) injection, morphine injection, ondansetron **OR** ondansetron (ZOFRAN) IV, polyethylene glycol  DVT prophylaxis: SCDs Code Status: Full code Family Communication: No family present at bedside Disposition Plan: SNF placement pending  Consultants:   Neurology, Dr. Cheral Marker, Dr. Erlinda Hong  Orthopedic surgery, Dr. Griffin Basil, Dr. Mardelle Matte  Procedures:   2D echo:  Impressions: - Extremely limited views; essentially no parasternal windows and only minimal apical windows. Most information from subcostal views, and use of echo contrast did not significantly improve imaging. - Grossly normal LVEF. Cannot determine wall motion. Unable to see most valvular structures.  Antimicrobials:  None    Objective: Vitals:   09/05/18 0439 09/05/18 1539 09/05/18 2056 09/06/18 0555  BP: 139/79 138/61 (!) 158/76 (!) 176/78  Pulse: (!) 50 (!) 58 61 (!) 54  Resp:      Temp: 98.4 F (36.9 C)  98.4 F (36.9 C) 98.1 F (36.7 C)  TempSrc: Oral Oral Oral Oral  SpO2: 96% 94% 100% 97%  Weight:      Height:        Intake/Output Summary (Last 24 hours) at 09/06/2018 1121 Last data filed at 09/06/2018 0700 Gross per 24 hour  Intake 1228.49 ml  Output 650 ml  Net 578.49 ml   Filed Weights   09/02/18 1836  Weight: 86.2 kg    Examination:  Constitutional: NAD, calm, comfortable Eyes: PERRL, lids and conjunctivae normal ENMT: Mucous membranes are moist.  Neck: normal, supple Respiratory: clear to auscultation bilaterally, no wheezing, no crackles. Normal respiratory effort.  Cardiovascular: Regular rate and rhythm, no murmurs / rubs / gallops. No LE edema. 2+ pedal pulses.  Abdomen: no tenderness. Bowel sounds positive.  Skin: no rashes, lesions, ulcers. No induration Neurologic: non focal   Data Reviewed: I have independently reviewed following labs and imaging studies  CBC: Recent Labs  Lab 09/02/18 1844  09/04/18 0245 09/05/18 0403 09/06/18 0302  WBC 8.6 7.0 7.0 7.6  NEUTROABS 6.8  --   --   --   HGB 16.1 15.7 14.4 14.7  HCT 50.1 48.0 43.9 44.7  MCV 88.2 88.1 87.1 88.9  PLT 90* 78* 77* 76*   Basic Metabolic Panel: Recent Labs  Lab 09/02/18 1844 09/04/18 0245 09/05/18 0403 09/06/18 0302  NA 139 142 140 144  K 4.0 3.5 3.7 3.8  CL 104 106 104 107  CO2 22 24 23 27   GLUCOSE 188* 112* 144* 146*  BUN 9 11 18 18   CREATININE 0.93 1.00 1.01 0.93  CALCIUM 9.3 9.1 8.6* 8.7*   GFR: Estimated Creatinine Clearance: 89.2 mL/min (by C-G formula based on SCr of 0.93 mg/dL). Liver Function Tests: Recent Labs  Lab 09/02/18 1844  AST 23  ALT 18  ALKPHOS 117  BILITOT 2.2*  PROT 6.7  ALBUMIN 4.2   No results for input(s): LIPASE, AMYLASE in the last 168 hours. Recent Labs  Lab 09/02/18 1845  AMMONIA 27   Coagulation Profile: Recent Labs  Lab 09/03/18 0825  INR 1.19   Cardiac Enzymes: No results for input(s): CKTOTAL, CKMB, CKMBINDEX, TROPONINI in the last 168 hours. BNP (last 3 results) No results for input(s): PROBNP in the last 8760 hours. HbA1C: Recent Labs    09/04/18 0245  HGBA1C 5.3   CBG: Recent Labs  Lab 09/02/18 1857 09/04/18 1438 09/04/18 1806 09/05/18 1200  GLUCAP 191* 128* 135* 168*   Lipid Profile: Recent Labs    09/04/18  0245  CHOL 179  HDL 30*  LDLCALC 128*  TRIG 106  CHOLHDL 6.0   Thyroid Function Tests: No results for input(s): TSH, T4TOTAL, FREET4, T3FREE, THYROIDAB in the last 72 hours. Anemia Panel: No results for input(s): VITAMINB12, FOLATE, FERRITIN, TIBC, IRON, RETICCTPCT in the last 72 hours. Urine analysis:    Component Value Date/Time   COLORURINE YELLOW 09/02/2018 2009   APPEARANCEUR CLEAR 09/02/2018 2009   LABSPEC 1.012 09/02/2018 2009   PHURINE 6.0 09/02/2018 2009   GLUCOSEU 50 (A) 09/02/2018 2009   HGBUR NEGATIVE 09/02/2018 2009   [place] NEGATIVE 09/02/2018 2009   KETONESUR 5 (A) 09/02/2018 2009   PROTEINUR  NEGATIVE 09/02/2018 2009   NITRITE NEGATIVE 09/02/2018 2009   LEUKOCYTESUR NEGATIVE 09/02/2018 2009   Sepsis Labs: Invalid input(s): PROCALCITONIN, LACTICIDVEN  Recent Results (from the past 240 hour(s))  Blood culture (routine x 2)     Status: None (Preliminary result)   Collection Time: 09/02/18  7:38 PM  Result Value Ref Range Status   Specimen Description BLOOD LEFT ARM  Final   Special Requests   Final    BOTTLES DRAWN AEROBIC AND ANAEROBIC Blood Culture adequate volume   Culture   Final    NO GROWTH 4 DAYS Performed at [facility], [address] [place], [place] [address]    Report Status PENDING  Incomplete  Blood culture (routine x 2)     Status: None (Preliminary result)   Collection Time: 09/02/18  7:40 PM  Result Value Ref Range Status   Specimen Description BLOOD LEFT HAND  Final   Special Requests   Final    BOTTLES DRAWN AEROBIC ONLY Blood Culture results may not be optimal due to an excessive volume of blood received in culture bottles   Culture   Final    NO GROWTH 4 DAYS Performed at [facility], [place] [address], [place], [place] [address]    Report Status PENDING  Incomplete  MRSA PCR Screening     Status: None   Collection Time: 09/03/18  1:02 AM  Result Value Ref Range Status   MRSA by PCR NEGATIVE NEGATIVE Final    Comment:        The GeneXpert MRSA Assay (FDA approved for NASAL specimens only), is one component of a comprehensive MRSA colonization surveillance program. It is not intended to diagnose MRSA infection nor to guide or monitor treatment for MRSA infections. Performed at [facility], [place] [address] [place], [place] [address]       Radiology Studies: Ct Angio Head W Or Wo Contrast  Result Date: 09/04/2018 CLINICAL DATA:  Confusion and altered mental status, acutely worsened after a fall. EXAM: CT ANGIOGRAPHY HEAD AND NECK TECHNIQUE: Multidetector CT imaging of the head and neck was performed using the standard  protocol during bolus administration of intravenous contrast. Multiplanar CT image reconstructions and MIPs were obtained to evaluate the vascular anatomy. Carotid stenosis measurements (when applicable) are obtained utilizing NASCET criteria, using the distal internal carotid diameter as the denominator. CONTRAST:  64mL ISOVUE-370 IOPAMIDOL (ISOVUE-370) INJECTION 76% COMPARISON:  CT head 09/02/2018. MR head 09/03/2018 demonstrated acute and subacute infarcts. FINDINGS: CT HEAD FINDINGS Brain: No evidence for acute infarction, hemorrhage, mass lesion, hydrocephalus, or extra-axial fluid. Slight atrophy. Extensive white matter disease. Chronic LEFT basal ganglia infarct. Acute to subacute infarcts demonstrated on yesterday's MR are poorly visualized on today's study. Vascular: Reported separately. Skull: Intact. Sinuses: Imaged portions are clear. Orbits: No acute finding.  Review of the MIP images confirms the above findings CTA NECK FINDINGS Aortic arch: Standard branching. Imaged portion shows no evidence of aneurysm or dissection. No significant stenosis of the major arch vessel origins. Unusually low origin LEFT vertebral from proximal subclavian, with apparent thyrocervical branch arising directly from the proximal vertebral. Right carotid system: No evidence of dissection, stenosis (50% or greater) or occlusion. Left carotid system: No evidence of dissection, stenosis (50% or greater) or occlusion. Vertebral arteries: BILATERAL patent, RIGHT dominant. No significant proximal narrowing. Skeleton: Unremarkable.  Spondylosis. Other neck: Noncontributory. Upper chest: No mass, effusion, or pneumothorax. Review of the MIP images confirms the above findings. CTA HEAD FINDINGS Anterior circulation: No significant stenosis, proximal occlusion, aneurysm, or vascular malformation. Posterior circulation: No significant stenosis, proximal occlusion, aneurysm, or vascular malformation. Venous sinuses: As permitted by  contrast timing, patent. Anatomic variants: LEFT vertebral predominantly contributes to PICA. Delayed phase: No abnormal intracranial enhancement. Review of the MIP images confirms the above findings IMPRESSION: 1. No extracranial or intracranial flow reducing lesion is identified. 2. No abnormal postcontrast enhancement. 3. Non dominant LEFT vertebral primarily supplies PICA. Electronically Signed   By: Staci Righter M.D.   On: 09/04/2018 13:10   Ct Angio Neck W Or Wo Contrast  Result Date: 09/04/2018 CLINICAL DATA:  Confusion and altered mental status, acutely worsened after a fall. EXAM: CT ANGIOGRAPHY HEAD AND NECK TECHNIQUE: Multidetector CT imaging of the head and neck was performed using the standard protocol during bolus administration of intravenous contrast. Multiplanar CT image reconstructions and MIPs were obtained to evaluate the vascular anatomy. Carotid stenosis measurements (when applicable) are obtained utilizing NASCET criteria, using the distal internal carotid diameter as the denominator. CONTRAST:  61mL ISOVUE-370 IOPAMIDOL (ISOVUE-370) INJECTION 76% COMPARISON:  CT head 09/02/2018. MR head 09/03/2018 demonstrated acute and subacute infarcts. FINDINGS: CT HEAD FINDINGS Brain: No evidence for acute infarction, hemorrhage, mass lesion, hydrocephalus, or extra-axial fluid. Slight atrophy. Extensive white matter disease. Chronic LEFT basal ganglia infarct. Acute to subacute infarcts demonstrated on yesterday's MR are poorly visualized on today's study. Vascular: Reported separately. Skull: Intact. Sinuses: Imaged portions are clear. Orbits: No acute finding. Review of the MIP images confirms the above findings CTA NECK FINDINGS Aortic arch: Standard branching. Imaged portion shows no evidence of aneurysm or dissection. No significant stenosis of the major arch vessel origins. Unusually low origin LEFT vertebral from proximal subclavian, with apparent thyrocervical branch arising directly from  the proximal vertebral. Right carotid system: No evidence of dissection, stenosis (50% or greater) or occlusion. Left carotid system: No evidence of dissection, stenosis (50% or greater) or occlusion. Vertebral arteries: BILATERAL patent, RIGHT dominant. No significant proximal narrowing. Skeleton: Unremarkable.  Spondylosis. Other neck: Noncontributory. Upper chest: No mass, effusion, or pneumothorax. Review of the MIP images confirms the above findings. CTA HEAD FINDINGS Anterior circulation: No significant stenosis, proximal occlusion, aneurysm, or vascular malformation. Posterior circulation: No significant stenosis, proximal occlusion, aneurysm, or vascular malformation. Venous sinuses: As permitted by contrast timing, patent. Anatomic variants: LEFT vertebral predominantly contributes to PICA. Delayed phase: No abnormal intracranial enhancement. Review of the MIP images confirms the above findings IMPRESSION: 1. No extracranial or intracranial flow reducing lesion is identified. 2. No abnormal postcontrast enhancement. 3. Non dominant LEFT vertebral primarily supplies PICA. Electronically Signed   By: Staci Righter M.D.   On: 09/04/2018 13:10   Pelvis Portable  Result Date: 09/04/2018 CLINICAL DATA:  Right hip pinning EXAM: PORTABLE PELVIS 1-2 VIEWS COMPARISON:  09/04/2018 FINDINGS: Cannulated screws are  noted within the right proximal femur across the femoral neck fracture. Anatomic alignment. No hardware or bony complicating feature. IMPRESSION: Internal fixation across the right femoral neck fracture. No complicating feature. Electronically Signed   By: Rolm Baptise M.D.   On: 09/04/2018 17:29   Dg C-arm 1-60 Min  Result Date: 09/04/2018 CLINICAL DATA:  Right hip fracture repair. Images: 2. FLUOROSCOPY TIME:  49.5 seconds. EXAM: DG C-ARM 61-120 MIN COMPARISON:  None. FINDINGS: Three screws have been placed across the right femoral neck fracture. IMPRESSION: Three screws have been placed across the  right femoral neck fracture. Electronically Signed   By: Dorise Bullion III M.D   On: 09/04/2018 16:58   Dg Hip Operative Unilat W Or W/o Pelvis Right  Result Date: 09/04/2018 CLINICAL DATA:  Right hip fracture repair. Images: 2. FLUOROSCOPY TIME:  49.5 seconds. EXAM: DG C-ARM 61-120 MIN COMPARISON:  None. FINDINGS: Three screws have been placed across the right femoral neck fracture. IMPRESSION: Three screws have been placed across the right femoral neck fracture. Electronically Signed   By: Dorise Bullion III M.D   On: 09/04/2018 16:58   Vas [place] Lower Extremity Venous (dvt)  Result Date: 09/05/2018  Lower Venous Study Indications: Stroke.  Performing Technologist: Abram Sander RVS  Examination Guidelines: A complete evaluation includes B-mode imaging, spectral Doppler, color Doppler, and power Doppler as needed of all accessible portions of each vessel. Bilateral testing is considered an integral part of a complete examination. Limited examinations for reoccurring indications may be performed as noted.  Right Venous Findings: +---------+---------------+---------+-----------+----------+-------+          CompressibilityPhasicitySpontaneityPropertiesSummary +---------+---------------+---------+-----------+----------+-------+ CFV      Full           Yes      Yes                          +---------+---------------+---------+-----------+----------+-------+ SFJ      Full                                                 +---------+---------------+---------+-----------+----------+-------+ FV Prox  Full                                                 +---------+---------------+---------+-----------+----------+-------+ FV Mid   Full                                                 +---------+---------------+---------+-----------+----------+-------+ FV DistalFull                                                  +---------+---------------+---------+-----------+----------+-------+ PFV      Full                                                 +---------+---------------+---------+-----------+----------+-------+  POP      Full           Yes      Yes                          +---------+---------------+---------+-----------+----------+-------+ PTV      Full                                                 +---------+---------------+---------+-----------+----------+-------+ PERO     Full                                                 +---------+---------------+---------+-----------+----------+-------+  Left Venous Findings: +---------+---------------+---------+-----------+----------+-------+          CompressibilityPhasicitySpontaneityPropertiesSummary +---------+---------------+---------+-----------+----------+-------+ CFV      Full           Yes      Yes                          +---------+---------------+---------+-----------+----------+-------+ SFJ      Full                                                 +---------+---------------+---------+-----------+----------+-------+ FV Prox  Full                                                 +---------+---------------+---------+-----------+----------+-------+ FV Mid   Full                                                 +---------+---------------+---------+-----------+----------+-------+ FV DistalFull                                                 +---------+---------------+---------+-----------+----------+-------+ PFV      Full                                                 +---------+---------------+---------+-----------+----------+-------+ POP      Full           Yes      Yes                          +---------+---------------+---------+-----------+----------+-------+ PTV      Full                                                  +---------+---------------+---------+-----------+----------+-------+  PERO     Full                                                 +---------+---------------+---------+-----------+----------+-------+    Summary: Right: There is no evidence of deep vein thrombosis in the lower extremity. No cystic structure found in the popliteal fossa. Left: There is no evidence of deep vein thrombosis in the lower extremity. No cystic structure found in the popliteal fossa.  *See table(s) above for measurements and observations. Electronically signed by Deitra Mayo MD on 09/05/2018 at 1:51:02 PM.    Final     Marzetta Board, MD, PhD [facility] Pager [phone]  If 7PM-7AM, please contact night-coverage [email or web address] Password [email or web address] 09/06/2018, 11:21 AM

## 2018-09-06 NOTE — Evaluation (Signed)
Occupational Therapy Evaluation Patient Details Name: Matthew Roman MRN: [id number] DOB: 1956/02/18 Today's Date: 09/06/2018    History of Present Illness 62 year old male with history of COPD, liver cirrhosis, hypertension, who is being brought to the hospital on 12/12 with confusion, disorientation, progressively worse over the last several weeks per granddaughter.  He has had a fall at the nursing home while visiting the wife, followed by severe right hip pain. was found to have hip fracture, now s/p cannulated hip pinning on 12/14 by Dr. 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   Clinical Impression   PTA Pt independent in ADL and mobility. Pt is currently min guard for transfers and min A for LB ADL tasks. Pt had word finding difficulties throughout session. He also complained that his vision is blurry. Visual testing did not reveal any deficits - and Pt is going to ask family to bring in his glasses for further evaluation. OT will continue to follow for cognitive deficits (please plan on formal cognitive assessment next session)  And safety/independence in Acute and post acute (SNF) setting for completing transfers and functional ADL tasks.     Follow Up Recommendations  SNF;Supervision/Assistance - 24 hour    Equipment Recommendations  Other (comment)(defer to next session)    Recommendations for Other Services Speech consult(cognition)     Precautions / Restrictions Precautions Precautions: Fall Restrictions Weight Bearing Restrictions: Yes RLE Weight Bearing: Weight bearing as tolerated      Mobility  Bed Mobility Overal bed mobility: Needs Assistance Bed Mobility: Sit to Supine;Supine to Sit     Supine to sit: Min guard Sit to supine: Min guard   General bed mobility comments: Noted pt uses UEs to lift RLE into bed  Transfers Overall transfer level: Needs assistance Equipment used: Rolling walker (2 wheeled) Transfers: Sit to/from Stand Sit to Stand: Min guard         General transfer comment: Cues for hand placement    Balance Overall balance assessment: Needs assistance           Standing balance-Leahy Scale: Fair                             ADL either performed or assessed with clinical judgement   ADL Overall ADL's : Needs assistance/impaired Eating/Feeding: Independent   Grooming: Wash/dry hands;Wash/dry face;Min guard;Standing Grooming Details (indicate cue type and reason): no visual deficits noticed Upper Body Bathing: Min guard   Lower Body Bathing: Minimal assistance;Sit to/from stand   Upper Body Dressing : Set up;Sitting   Lower Body Dressing: Minimal assistance;Sit to/from stand Lower Body Dressing Details (indicate cue type and reason): able to don/doff socks with increased time. INcreased assist for articles of clothing that require sit to stand like pants Toilet Transfer: Min Marine scientist Details (indicate cue type and reason): vc for safety with RW Toileting- Clothing Manipulation and Hygiene: Min guard   Tub/ Shower Transfer: Tub transfer;Minimal assistance;Cueing for safety   Functional mobility during ADLs: Min guard;Rolling walker       Vision Baseline Vision/History: Wears glasses Wears Glasses: At all times Patient Visual Report: Blurring of  vision Vision Assessment?: Yes Eye Alignment: Within Functional Limits Ocular Range of Motion: Within Functional Limits Alignment/Gaze Preference: Within Defined Limits Tracking/Visual Pursuits: Able to track stimulus in all quads without  difficulty Saccades: Within functional limits Convergence: Within functional limits Visual Fields: No apparent deficits Diplopia Assessment: (denies) Additional Comments: Requested that Pt have famiy bring glasses from home     Perception     Praxis      Pertinent Vitals/Pain Pain Assessment: 0-10 Pain Score: 10-Worst pain ever Faces Pain Scale: Hurts a little bit Pain Location: R hip Pain Descriptors / Indicators: Aching;Grimacing;Guarding Pain Intervention(s): Monitored during session(nursing aware and retrieving medicine)     Hand Dominance Right   Extremity/Trunk Assessment Upper Extremity Assessment Upper Extremity Assessment: Overall WFL for tasks assessed   Lower Extremity Assessment Lower Extremity Assessment: Defer to PT evaluation       Communication Communication Communication: Expressive difficulties(word finding)   Cognition Arousal/Alertness: Awake/alert Behavior During Therapy: WFL for tasks assessed/performed Overall Cognitive Status: Impaired/Different from baseline Area of Impairment: Awareness;Problem solving;Attention;Safety/judgement                                   General Comments       Exercises     Shoulder Instructions      Home Living Family/patient expects to be discharged to:: Private residence Living Arrangements: Spouse/significant other(currently at [employer]) Available Help at Discharge: Available PRN/intermittently Type of Home: House Home Access: Stairs to enter [employer] of Steps: 1 Entrance Stairs-Rails: None Home Layout: One level     Bathroom Shower/Tub: Teacher, early years/pre: Standard Bathroom Accessibility: Yes How Accessible: Accessible via walker Home Equipment: None      Lives With: Spouse    Prior Functioning/Environment Level of Independence: Independent                 OT Problem List: Decreased activity tolerance;Decreased range of  motion;Impaired balance (sitting and/or standing);Impaired vision/perception;Decreased cognition;Decreased safety awareness;Decreased knowledge of use of DME or AE;Decreased knowledge of precautions      OT Treatment/Interventions: Self-care/ADL training;Energy conservation;DME and/or AE instruction;Therapeutic activities;Cognitive remediation/compensation;Visual/perceptual remediation/compensation;Patient/family education;Balance training    OT Goals(Current goals can be found in the care plan section) Acute Rehab OT Goals Patient Stated Goal: "I just want to be able to take care of my wife" OT Goal Formulation: With patient Time For Goal Achievement: 09/20/18 Potential to Achieve Goals: Good ADL Goals Pt Will Perform Grooming: with modified independence;standing Pt Will Perform Lower Body Bathing: with modified independence;sit to/from stand Pt Will Perform Lower Body Dressing: with modified independence;sit to/from stand Pt Will Transfer to Toilet: with modified independence;ambulating Pt Will Perform Toileting - Clothing Manipulation and hygiene: with modified independence;sit to/from stand Additional ADL Goal #1: Pt will perform higher level executive thinking tasks with 3 or less cues  OT Frequency: Min 2X/week   Barriers to D/C: Decreased caregiver support  Pt's spouse is currently at SNF ([facility])       Co-evaluation              AM-PAC OT "6 Clicks" Daily Activity     Outcome Measure Help from another person eating meals?: None Help from another person taking care of personal grooming?: A Little Help from another person toileting, which includes using toliet, bedpan, or urinal?: A Little Help from another person bathing (including washing, rinsing, drying)?: A Lot Help from another person to  put on and taking off regular upper body clothing?: A Little Help from another person to put on and taking off regular lower body clothing?: A Lot 6 Click Score: 17   End  of Session Equipment Utilized During Treatment: Gait belt;Rolling walker Nurse Communication: Mobility status  Activity Tolerance: Patient tolerated treatment well Patient left: in bed;with call bell/phone within reach;with bed alarm set  OT Visit Diagnosis: Unsteadiness on feet (R26.81);Other abnormalities of gait and mobility (R26.89);History of falling (Z91.81);Other symptoms and signs involving the nervous system (R29.898);Other symptoms and signs involving cognitive function                Time: 1350-1424 OT Time Calculation (min): 34 min Charges:  OT General Charges $OT Visit: 1 Visit OT Evaluation $OT Eval Moderate Complexity: 1 Mod OT Treatments $Self Care/Home Management : 8-22 mins $Therapeutic Activity: 8-22 mins  [name] OTR/L Acute Rehabilitation Services Pager: [phone] Office: [place] 09/06/2018, 5:04 PM

## 2018-09-06 NOTE — Progress Notes (Signed)
Physical Therapy Treatment Patient Details Name: Matthew Roman MRN: [id number] DOB: 1956/08/31 Today's Date: 09/06/2018    History of Present Illness 62 year old male with history of COPD, liver cirrhosis, hypertension, who is being brought to the hospital on 12/12 with confusion, disorientation, progressively worse over the last several weeks per granddaughter.  He has had a fall at the nursing home while visiting the wife, followed by severe right hip pain. was found to have hip fracture, now s/p cannulated hip pinning on 12/14 by Dr. 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    PT Comments    Continuing work on functional mobility and activity tolerance;  More painful today, and moving slower, requiring more assistance; Cues to use RW to minimize WB on LLE to help with pain and per Dr. Luanna Cole recommendation; Emphasized to Matthew Roman that he MUST use RW with ambulating;   Discussed case with Dr. Cruzita Lederer; As of yet, we have not identified consistent help for [name] at home; At this point, we must consider SNF for post-acute rehab to maximize independence and safety with mobility, and to receive PT, OT, and ST services post CVA   Follow Up Recommendations  SNF;Supervision/Assistance - 24 hour     Equipment Recommendations  Rolling walker with 5" wheels;3in1 (PT)    Recommendations for Other Services OT consult;Speech consult(for stroke workup; ordered OT per protocol)     Precautions / Restrictions Precautions Precautions: Fall Restrictions Weight Bearing Restrictions: Yes RLE Weight Bearing:  Weight bearing as tolerated    Mobility  Bed Mobility Overal bed mobility: Needs Assistance Bed Mobility: Sit to Supine       Sit to supine: Min guard   General bed mobility comments: Noted pt uses UEs to lift RLE into bed  Transfers Overall transfer level: Needs assistance Equipment used: Rolling walker (2 wheeled) Transfers: Sit to/from Stand Sit to Stand: Min guard         General transfer comment: Cues for hand placement  Ambulation/Gait Ambulation/Gait assistance: Min guard Gait Distance (Feet): (Hallway ambulation) Assistive device: Rolling walker (2 wheeled) Gait Pattern/deviations: Step-through pattern Gait velocity: slower today; more painful   General Gait Details: More painful today, cued to use RW to unweigh painful RLE in stance; good use of RW to minimize RLE weight bearign with RW   Stairs             Wheelchair Mobility    Modified Rankin (Stroke Patients Only) Modified Rankin (Stroke Patients Only) Pre-Morbid Rankin Score: No symptoms Modified Rankin: Moderate disability(also with acute R hip fracture)     Balance Overall balance assessment: Needs assistance           Standing balance-Leahy Scale: Fair                              Cognition Arousal/Alertness: Awake/alert Behavior During Therapy: WFL for tasks assessed/performed Overall Cognitive Status: Within Functional Limits for tasks assessed(for simple mobility tasks)  Exercises Total Joint Exercises Ankle Circles/Pumps: AROM;Both;10 reps Quad Sets: AROM;Both;10 reps Gluteal Sets: AROM;Both;10 reps Towel Squeeze: AROM;Both;10 reps Heel Slides: AROM;Right;10 reps Hip ABduction/ADduction: AROM;Right;10 reps    General Comments        Pertinent Vitals/Pain Pain Assessment: 0-10 Pain Score: 8  Pain Location: R hip at end of walk Pain Descriptors / Indicators: Aching;Grimacing;Guarding Pain  Intervention(s): Monitored during session;Patient requesting pain meds-RN notified    Home Living                      Prior Function            PT Goals (current goals can now be found in the care plan section) Acute Rehab PT Goals Patient Stated Goal: Hopes to get home soon PT Goal Formulation: With patient Time For Goal Achievement: 09/19/18 Potential to Achieve Goals: Good Progress towards PT goals: Progressing toward goals(Slowly; More painful today)    Frequency    Min 3X/week      PT Plan Discharge plan needs to be updated;Frequency needs to be updated    Co-evaluation              AM-PAC PT "6 Clicks" Mobility   Outcome Measure  Help needed turning from your back to your side while in a flat bed without using bedrails?: A Little Help needed moving from lying on your back to sitting on the side of a flat bed without using bedrails?: A Little Help needed moving to and from a bed to a chair (including a wheelchair)?: A Little Help needed standing up from a chair using your arms (e.g., wheelchair or bedside chair)?: A Little Help needed to walk in hospital room?: A Little Help needed climbing 3-5 steps with a railing? : A Little 6 Click Score: 18    End of Session Equipment Utilized During Treatment: Gait belt Activity Tolerance: Patient tolerated treatment well Patient left: with call bell/phone within reach;in bed;with bed alarm set Nurse Communication: Mobility status PT Visit Diagnosis: Unsteadiness on feet (R26.81);Other abnormalities of gait and mobility (R26.89);Other symptoms and signs involving the nervous system (R29.898)     Time: 4765-4650 PT Time Calculation (min) (ACUTE ONLY): 25 min  Charges:  $Gait Training: 8-22 mins $Therapeutic Exercise: 8-22 mins                     Roney Marion, PT  Acute Rehabilitation Services Pager [phone] Office [place] 09/06/2018, 10:52 AM

## 2018-09-07 ENCOUNTER — Other Ambulatory Visit: Payer: Self-pay | Admitting: Nurse Practitioner

## 2018-09-07 DIAGNOSIS — I639 Cerebral infarction, unspecified: Secondary | ICD-10-CM

## 2018-09-07 LAB — CBC
HCT: 42.9 % (ref 39.0–52.0)
Hemoglobin: 14.3 g/dL (ref 13.0–17.0)
MCH: 29.2 pg (ref 26.0–34.0)
MCHC: 33.3 g/dL (ref 30.0–36.0)
MCV: 87.6 fL (ref 80.0–100.0)
Platelets: 74 10*3/uL — ABNORMAL LOW (ref 150–400)
RBC: 4.9 MIL/uL (ref 4.22–5.81)
RDW: 12.7 % (ref 11.5–15.5)
WBC: 5.2 10*3/uL (ref 4.0–10.5)
nRBC: 0 % (ref 0.0–0.2)

## 2018-09-07 LAB — GLUCOSE, CAPILLARY: GLUCOSE-CAPILLARY: 174 mg/dL — AB (ref 70–99)

## 2018-09-07 LAB — CULTURE, BLOOD (ROUTINE X 2)
Culture: NO GROWTH
Culture: NO GROWTH
Special Requests: ADEQUATE

## 2018-09-07 MED ORDER — ATORVASTATIN CALCIUM 20 MG PO TABS: 20.0000 mg | ORAL_TABLET | Freq: Every day | ORAL | Status: DC

## 2018-09-07 MED ORDER — ASPIRIN EC 81 MG PO TBEC: 81.0000 mg | DELAYED_RELEASE_TABLET | Freq: Every day | ORAL | 2 refills | Status: AC

## 2018-09-07 NOTE — Progress Notes (Signed)
Patient will DC to: [facility] Anticipated DC date: 09/07/18 Family notified: Nira Conn Transport by: Corey Harold  Per MD patient ready for DC to [facility] . RN, patient, patient's family, and facility notified of DC. Discharge Summary sent to facility. RN given number for report [phone] Room 309. DC packet on chart. Ambulance transport requested for patient.  CSW signing off.  [place], [place]

## 2018-09-07 NOTE — Progress Notes (Signed)
[facility] ready to transport pt. VS taken. Refer to flowsheet. Hypertensive, ok per MD. Attempted report to facility x2, went to VM and no response. Nira Conn, daughter, updated.

## 2018-09-07 NOTE — Discharge Summary (Signed)
Physician Discharge Summary  Matthew Roman NWG:[id number] DOB: [birth year] DOA: 09/02/2018  PCP: Patient, No Pcp Per  Admit date: 09/02/2018 Discharge date: 09/07/2018  Admitted From: home Disposition:  SNF - [facility]  Recommendations for Outpatient Follow-up:  1. Follow up with PCP in 1-2 weeks 2. Follow-up with orthopedic surgery in 1 to 2 weeks 3. Cardiology has been contacted for 30-day outpatient event monitor, they will contact the patient  Home Health: None Equipment/Devices: None  Discharge Condition: Stable CODE STATUS: Full code Diet recommendation: Heart healthy  HPI: Per Dr. Alcario Drought, Matthew Roman is a 62 y.o. male with medical history significant of COPD, cirrhosis, HTN.  Patient presents to the ED with confusion and AMS.  Had been disoriented for past couple of months per granddaughter but acutely worse after a fall yesterday.  Yesterday was at [facility] visiting wife.  Fell.  Severe R hip pain following fall worse with ambulation or movement.  Symptoms persistent today.  Does take chronic oxycodone and robaxin.  Hospital Course:  Principal Problem Acute CVA -CT scan initially in the ED showed small age-indeterminate lacunar infarct within the thalamus and right basal ganglia. This was followed by an MRI which showed multiple subcentimeter infarcts of mixed age scattered throughout the frontal and parietal white matter, including a prominent subacute infarct in the right medial frontal lobe and acute infarct in the right posterior superior frontal lobe without associated hemorrhage or mass-effect.  It also showed chronic infarcts in multiple other regions with severe chronic microvascular ischemic changes and moderate volume loss of brain for age.  Neurology was consulted and followed patient while hospitalized.  Patient underwent stroke work-up, hemoglobin A1c was done and was 5.3, lipid panel showed an LDL of 113, will start statin. 2D echo with limited views but  normal EF. CT angiogram head and neck without significant occlusion. Continue aspirin, neurology recommends 30-day cardiac event monitoring as an outpatient to rule out A. fib, cardiology contacted.  To be discharged to SNF  Additional Problems Right femoral neck fracture -Orthopedics consulted, status post cannulated hip pinning on 12/14 by Dr. Mardelle Matte. They recommend Lovenox for a month for prophylaxis Acute on chronic metabolic encephalopathy -Likely in the setting of #1, patient alert and oriented on my evaluation, likely not true encephalopathy but expressive aphasia makes communication difficult and may appear that patient is encephalopathic. Possible liver cirrhosis -CT scan of the abdomen pelvis done on 12/12 showed subtle contour nodularity of the liver suggesting cirrhosis with splenomegaly. Per prior reports he has a history of hep C that was treated in 2017.  Also has a history of alcohol abuse. Thrombocytopenia stable without evidence of bleed COPD/emphysema -In the setting of tobacco use, no wheezing, not an active issue History of colon cancer -Status post a routine screening colonoscopy in 03/2017, 1 of the polyps removed was malignant.  Evaluated by general surgery as an outpatient, and there is a possibility that his cancer was completely removed during the polypectomy and after discussion with the patient he did not wanted surgery but chose for observation and follow-up.  Continue outpatient management  Discharge Diagnoses:  Principal Problem:   Fracture of femoral neck, right, closed ([place]) Active Problems:   Acute encephalopathy   Multiple lacunar infarcts [facility])   Discharge Instructions  Discharge Instructions    Ambulatory referral to Neurology   Complete by:  As directed    Follow up with stroke clinic NP (Jessica Vanschaick or Cecille Rubin, if both not available, consider Zachery Dauer,  or [place]) at [facility] in about 4 weeks. Thanks.   Weight bearing as tolerated    Complete by:  As directed      Allergies as of 09/07/2018   No Known Allergies     Medication List    TAKE these medications   aspirin EC 81 MG tablet Take 1 tablet (81 mg total) by mouth daily.   atorvastatin 20 MG tablet Commonly known as:  LIPITOR Take 1 tablet (20 mg total) by mouth daily at 6 PM.   enoxaparin 40 MG/0.4ML injection Commonly known as:  LOVENOX Inject 0.4 mLs (40 mg total) into the skin daily.   methocarbamol 750 MG tablet Commonly known as:  ROBAXIN Take 750-1,500 mg by mouth See admin instructions. Take 1 tablet by mouth morning/noon as needed and take 2 tabs at bedtime as needed   oxyCODONE 15 MG immediate release tablet Commonly known as:  ROXICODONE Take 1 tablet (15 mg total) by mouth 3 (three) times daily.   pregabalin 150 MG capsule Commonly known as:  LYRICA Take 150 mg by mouth 2 (two) times daily.            Discharge Care Instructions  (From admission, onward)         Start     Ordered   09/05/18 0000  Weight bearing as tolerated     09/05/18 1436         Follow-up Information    Marchia Bond, MD. Schedule an appointment as soon as possible for a visit in 2 week(s).   Specialty:  Orthopedic Surgery Contact information: [address] [place] [address] [phone]        [facility]. Schedule an appointment as soon as possible for a visit in 4 week(s).   Specialty:  Neurology Contact information: [address] [place] [phone]          Consultations:  Neurology   Orthopedic surgery   Procedures/Studies:  2D echo  Study Conclusions  - Left ventricle: The cavity size was normal. Systolic function was normal. The estimated ejection fraction was in the range of 50% to 55%. - Atrial septum: No defect or patent foramen ovale was identified.  Ct Angio Head W Or Wo Contrast  Result Date: 09/04/2018 CLINICAL DATA:   Confusion and altered mental status, acutely worsened after a fall. EXAM: CT ANGIOGRAPHY HEAD AND NECK TECHNIQUE: Multidetector CT imaging of the head and neck was performed using the standard protocol during bolus administration of intravenous contrast. Multiplanar CT image reconstructions and MIPs were obtained to evaluate the vascular anatomy. Carotid stenosis measurements (when applicable) are obtained utilizing NASCET criteria, using the distal internal carotid diameter as the denominator. CONTRAST:  67mL ISOVUE-370 IOPAMIDOL (ISOVUE-370) INJECTION 76% COMPARISON:  CT head 09/02/2018. MR head 09/03/2018 demonstrated acute and subacute infarcts. FINDINGS: CT HEAD FINDINGS Brain: No evidence for acute infarction, hemorrhage, mass lesion, hydrocephalus, or extra-axial fluid. Slight atrophy. Extensive white matter disease. Chronic LEFT basal ganglia infarct. Acute to subacute infarcts demonstrated on yesterday's MR are poorly visualized on today's study. Vascular: Reported separately. Skull: Intact. Sinuses: Imaged portions are clear. Orbits: No acute finding. Review of the MIP images confirms the above findings CTA NECK FINDINGS Aortic arch: Standard branching. Imaged portion shows no evidence of aneurysm or dissection. No significant stenosis of the major arch vessel origins. Unusually low origin LEFT vertebral from proximal subclavian, with apparent thyrocervical branch arising directly from the proximal vertebral. Right carotid  system: No evidence of dissection, stenosis (50% or greater) or occlusion. Left carotid system: No evidence of dissection, stenosis (50% or greater) or occlusion. Vertebral arteries: BILATERAL patent, RIGHT dominant. No significant proximal narrowing. Skeleton: Unremarkable.  Spondylosis. Other neck: Noncontributory. Upper chest: No mass, effusion, or pneumothorax. Review of the MIP images confirms the above findings. CTA HEAD FINDINGS Anterior circulation: No significant stenosis,  proximal occlusion, aneurysm, or vascular malformation. Posterior circulation: No significant stenosis, proximal occlusion, aneurysm, or vascular malformation. Venous sinuses: As permitted by contrast timing, patent. Anatomic variants: LEFT vertebral predominantly contributes to PICA. Delayed phase: No abnormal intracranial enhancement. Review of the MIP images confirms the above findings IMPRESSION: 1. No extracranial or intracranial flow reducing lesion is identified. 2. No abnormal postcontrast enhancement. 3. Non dominant LEFT vertebral primarily supplies PICA. Electronically Signed   By: Staci Righter M.D.   On: 09/04/2018 13:10   Ct Head Wo Contrast  Result Date: 09/02/2018 CLINICAL DATA:  Disoriented EXAM: CT HEAD WITHOUT CONTRAST TECHNIQUE: Contiguous axial images were obtained from the base of the skull through the vertex without intravenous contrast. COMPARISON:  MRI 05/07/2005 FINDINGS: Brain: No acute territorial infarction, hemorrhage or intracranial mass is visualized. Chronic infarct within the left basal ganglia and white matter. Age indeterminate small lacunar infarcts within the thalamus and right basal ganglia. Atrophy with moderate small vessel ischemic changes of the white matter. Stable ventricle size. Vascular: No hyperdense vessels.  Carotid vascular calcification. Skull: Normal. Negative for fracture or focal lesion. Sinuses/Orbits: Mucosal thickening in the ethmoid and maxillary sinuses. Other: None. IMPRESSION: 1. Small age indeterminate lacunar infarcts within the thalamus and right basal ganglia. 2. 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 Atrophy and small vessel ischemic changes of the white matter. Electronically Signed   By: Donavan Foil M.D.   On: 09/02/2018 21:25   Ct Angio Neck W Or Wo Contrast  Result Date: 09/04/2018 CLINICAL DATA:  Confusion and altered mental status, acutely worsened after a fall. EXAM: CT ANGIOGRAPHY HEAD AND NECK TECHNIQUE:  Multidetector CT imaging of the head and neck was performed using the standard protocol during bolus administration of intravenous contrast. Multiplanar CT image reconstructions and MIPs were obtained to evaluate the vascular anatomy. Carotid stenosis measurements (when applicable) are obtained utilizing NASCET criteria, using the distal internal carotid diameter as the denominator. CONTRAST:  67mL ISOVUE-370 IOPAMIDOL (ISOVUE-370) INJECTION 76% COMPARISON:  CT head 09/02/2018. MR head 09/03/2018 demonstrated acute and subacute infarcts. FINDINGS: CT HEAD FINDINGS Brain: No evidence for acute infarction, hemorrhage, mass lesion, hydrocephalus, or extra-axial fluid. Slight atrophy. Extensive white matter disease. Chronic LEFT basal ganglia infarct. Acute to subacute infarcts demonstrated on yesterday's MR are poorly visualized on today's study. Vascular: Reported separately. Skull: Intact. Sinuses: Imaged portions are clear. Orbits: No acute finding. Review of the MIP images confirms the above findings CTA NECK FINDINGS Aortic arch: Standard branching. Imaged portion shows no evidence of aneurysm or dissection. No significant stenosis of the major arch vessel origins. Unusually low origin LEFT vertebral from proximal subclavian, with apparent thyrocervical branch arising directly from the proximal vertebral. Right carotid system: No evidence of dissection, stenosis (50% or greater) or occlusion. Left carotid system: No evidence of dissection, stenosis (50% or greater) or occlusion. Vertebral arteries: BILATERAL patent, RIGHT dominant. No significant proximal narrowing. Skeleton: Unremarkable.  Spondylosis. Other neck: Noncontributory. Upper chest: No mass, effusion, or pneumothorax. Review of the MIP images confirms the above findings. CTA HEAD FINDINGS Anterior circulation: No significant stenosis, proximal occlusion, aneurysm,  or vascular malformation. Posterior circulation: No significant stenosis, proximal  occlusion, aneurysm, or vascular malformation. Venous sinuses: As permitted by contrast timing, patent. Anatomic variants: LEFT vertebral predominantly contributes to PICA. Delayed phase: No abnormal intracranial enhancement. Review of the MIP images confirms the above findings IMPRESSION: 1. No extracranial or intracranial flow reducing lesion is identified. 2. No abnormal postcontrast enhancement. 3. Non dominant LEFT vertebral primarily supplies PICA. Electronically Signed   By: Staci Righter M.D.   On: 09/04/2018 13:10   Mr Brain Wo Contrast  Result Date: 09/03/2018 CLINICAL DATA:  62 y/o M; chronic confusion and altered mental status acutely worsened after a fall yesterday. EXAM: MRI HEAD WITHOUT CONTRAST TECHNIQUE: Multiplanar, multiecho pulse sequences of the brain and surrounding structures were obtained without intravenous contrast. COMPARISON:  09/02/2018 CT head.  05/07/2005 MRI head. FINDINGS: Brain: Multiple subcentimeter infarcts of mixed age scattered throughout frontal and parietal white matter including a 9 mm subacute infarct with intermediate diffusion in the right medial frontal lobe (series 5, image 79) and 6 mm acute infarct in the right posterosuperior frontal lobe (series 5 image 84). No associated hemorrhage or mass effect. Chronic hemorrhagic lacunar infarct involving the left lentiform nucleus, medial thalamus, and cerebral peduncle. Additional small chronic lacunar infarcts are present in the bilateral thalami. Patchy nonspecific T2 FLAIR hyperintensities in subcortical and periventricular white matter as well as the pons are compatible with severe chronic microvascular ischemic changes for age. Moderate volume loss of the brain. No abnormal susceptibility hypointensity to indicate acute intracranial hemorrhage. Vascular: Normal flow voids. Skull and upper cervical spine: Normal marrow signal. Sinuses/Orbits: Negative. Other: None. IMPRESSION: 1. Multiple subcentimeter infarcts of  mixed age scattered throughout the frontal and parietal white matter including a prominent subacute infarct in the right medial frontal lobe and acute infarct in the right posterosuperior frontal lobe. No associated acute hemorrhage or mass effect. 2. Chronic infarct of left lentiform nucleus, thalamus, and cerebral peduncle. Small chronic lacunar infarcts of the thalami. 3. Severe chronic microvascular ischemic changes and moderate volume loss of the brain for age. Electronically Signed   By: Kristine Garbe M.D.   On: 09/03/2018 04:37   Ct Abdomen Pelvis W Contrast  Result Date: 09/02/2018 CLINICAL DATA:  Abdominal distension EXAM: CT ABDOMEN AND PELVIS WITH CONTRAST TECHNIQUE: Multidetector CT imaging of the abdomen and pelvis was performed using the standard protocol following bolus administration of intravenous contrast. CONTRAST:  158mL OMNIPAQUE IOHEXOL 300 MG/ML  SOLN COMPARISON:  05/21/2011 FINDINGS: Lower chest: Lung bases demonstrate a stable right lower lobe pulmonary nodule. No acute consolidation or effusion. The heart size is normal. Hepatobiliary: Subtle contour nodularity of the liver suggesting cirrhosis. Status post cholecystectomy. No biliary dilatation. Pancreas: Unremarkable. No pancreatic ductal dilatation or surrounding inflammatory changes. Spleen: Slightly enlarged. Adrenals/Urinary Tract: Adrenal glands are normal. No hydronephrosis. Small stones in the mid right kidney measuring up to 4 mm. Cyst in the lower pole of the left kidney. Bladder is normal. Stomach/Bowel: Stomach nonenlarged. No dilated small bowel. No colon wall thickening. Negative appendix. Vascular/Lymphatic: Moderate aortic atherosclerosis and mural thrombus. No aneurysm. No significantly enlarged lymph nodes. Reproductive: Slightly enlarged prostate Other: No free air or free fluid. Musculoskeletal: Postsurgical changes at L5-S1 with stabilization rods and fixating screws. Acute, slightly impacted appearing  right femoral neck fracture. IMPRESSION: 1. No CT evidence for acute intra-abdominal or pelvic abnormality. 2. Subtle contour nodularity of the liver suggesting cirrhosis. The spleen appears slightly enlarged 3. Findings suspicious for acute mildly impacted right  subcapital femoral neck fracture. 4. Nonobstructing stones in the right kidney Electronically Signed   By: Donavan Foil M.D.   On: 09/02/2018 21:19   Pelvis Portable  Result Date: 09/04/2018 CLINICAL DATA:  Right hip pinning EXAM: PORTABLE PELVIS 1-2 VIEWS COMPARISON:  09/04/2018 FINDINGS: Cannulated screws are noted within the right proximal femur across the femoral neck fracture. Anatomic alignment. No hardware or bony complicating feature. IMPRESSION: Internal fixation across the right femoral neck fracture. No complicating feature. Electronically Signed   By: Rolm Baptise M.D.   On: 09/04/2018 17:29   Dg Chest Port 1 View  Result Date: 09/02/2018 CLINICAL DATA:  Altered mental status. EXAM: PORTABLE CHEST 1 VIEW COMPARISON:  Radiographs of November 03, 2016. FINDINGS: The heart size and mediastinal contours are within normal limits. Both lungs are clear. The visualized skeletal structures are unremarkable. IMPRESSION: No active disease. Electronically Signed   By: Marijo Conception, M.D.   On: 09/02/2018 19:01   Dg C-arm 1-60 Min  Result Date: 09/04/2018 CLINICAL DATA:  Right hip fracture repair. Images: 2. FLUOROSCOPY TIME:  49.5 seconds. EXAM: DG C-ARM 61-120 MIN COMPARISON:  None. FINDINGS: Three screws have been placed across the right femoral neck fracture. IMPRESSION: Three screws have been placed across the right femoral neck fracture. Electronically Signed   By: Dorise Bullion III M.D   On: 09/04/2018 16:58   Dg Hip Operative Unilat W Or W/o Pelvis Right  Result Date: 09/04/2018 CLINICAL DATA:  Right hip fracture repair. Images: 2. FLUOROSCOPY TIME:  49.5 seconds. EXAM: DG C-ARM 61-120 MIN COMPARISON:  None. FINDINGS: Three  screws have been placed across the right femoral neck fracture. IMPRESSION: Three screws have been placed across the right femoral neck fracture. Electronically Signed   By: Dorise Bullion III M.D   On: 09/04/2018 16:58   Dg Hip Unilat With Pelvis 2-3 Views Right  Result Date: 09/02/2018 CLINICAL DATA:  62 year old male with fall and right hip pain. EXAM: DG HIP (WITH OR WITHOUT PELVIS) 2-3V RIGHT COMPARISON:  CT of the abdomen pelvis dated 09/02/2018 FINDINGS: There is a nondisplaced mildly impacted fracture of the right femoral neck with foreshortening of the femoral neck. No other acute fracture identified. The bones are osteopenic. There is no dislocation. Mild to moderate bilateral hip arthritic changes. Lower lumbar fusion screws. The soft tissues are grossly unremarkable. Excreted contrast noted within the bladder. IMPRESSION: Nondisplaced right femoral neck fracture.  No dislocation. Electronically Signed   By: Anner Crete M.D.   On: 09/02/2018 22:48   Vas US Carotid (at [place] Only)  Result Date: 09/03/2018 Carotid Arterial Duplex Study Indications: Lacunar infarction. Performing Technologist: Oliver Hum RVT  Examination Guidelines: A complete evaluation includes B-mode imaging, spectral Doppler, color Doppler, and power Doppler as needed of all accessible portions of each vessel. Bilateral testing is considered an integral part of a complete examination. Limited examinations for reoccurring indications may be performed as noted.  Right Carotid Findings: +----------+--------+-------+--------+--------------------------------+--------+           PSV cm/sEDV    StenosisDescribe                        Comments                   cm/s                                                    +----------+--------+-------+--------+--------------------------------+--------+  CCA Prox  91      12             smooth and heterogenous                   +----------+--------+-------+--------+--------------------------------+--------+ CCA Distal70      18             smooth and heterogenous                  +----------+--------+-------+--------+--------------------------------+--------+ ICA Prox  38      10             smooth, heterogenous and                                                  calcific                                 +----------+--------+-------+--------+--------------------------------+--------+ ICA Distal73      15                                             tortuous +----------+--------+-------+--------+--------------------------------+--------+ ECA       99      13                                                      +----------+--------+-------+--------+--------------------------------+--------+ +----------+--------+-------+--------+-------------------+           PSV cm/sEDV cmsDescribeArm Pressure (mmHG) +----------+--------+-------+--------+-------------------+ [id number]                                        +----------+--------+-------+--------+-------------------+ +---------+--------+--+--------+--+---------+ VertebralPSV cm/s44EDV cm/s14Antegrade +---------+--------+--+--------+--+---------+  Left Carotid Findings: +----------+--------+--------+--------+-----------------------+--------+           PSV cm/sEDV cm/sStenosisDescribe               Comments +----------+--------+--------+--------+-----------------------+--------+ CCA Prox  110     11              smooth and heterogenous         +----------+--------+--------+--------+-----------------------+--------+ CCA Distal73      15              smooth and heterogenous         +----------+--------+--------+--------+-----------------------+--------+ ICA Prox  53      12              smooth and heterogenous         +----------+--------+--------+--------+-----------------------+--------+ ICA Distal66      14                                      tortuous +----------+--------+--------+--------+-----------------------+--------+ ECA       88      16                                              +----------+--------+--------+--------+-----------------------+--------+ +----------+--------+--------+--------+-------------------+  SubclavianPSV cm/sEDV cm/sDescribeArm Pressure (mmHG) +----------+--------+--------+--------+-------------------+           118                                         +----------+--------+--------+--------+-------------------+ +---------+--------+--+--------+-+---------+ VertebralPSV cm/s48EDV cm/s7Antegrade +---------+--------+--+--------+-+---------+  Summary: Right Carotid: Velocities in the right ICA are consistent with a 1-39% stenosis. Left Carotid: Velocities in the left ICA are consistent with a 1-39% stenosis. Vertebrals: Bilateral vertebral arteries demonstrate antegrade flow. *See table(s) above for measurements and observations.  Electronically signed by Deitra Mayo MD on 09/03/2018 at 1:50:48 PM.    Final    Vas [place] Lower Extremity Venous (dvt)  Result Date: 09/05/2018  Lower Venous Study Indications: Stroke.  Performing Technologist: Abram Sander RVS  Examination Guidelines: A complete evaluation includes B-mode imaging, spectral Doppler, color Doppler, and power Doppler as needed of all accessible portions of each vessel. Bilateral testing is considered an integral part of a complete examination. Limited examinations for reoccurring indications may be performed as noted.  Right Venous Findings: +---------+---------------+---------+-----------+----------+-------+          CompressibilityPhasicitySpontaneityPropertiesSummary +---------+---------------+---------+-----------+----------+-------+ CFV      Full           Yes      Yes                          +---------+---------------+---------+-----------+----------+-------+ SFJ      Full                                                  +---------+---------------+---------+-----------+----------+-------+ FV Prox  Full                                                 +---------+---------------+---------+-----------+----------+-------+ FV Mid   Full                                                 +---------+---------------+---------+-----------+----------+-------+ FV DistalFull                                                 +---------+---------------+---------+-----------+----------+-------+ PFV      Full                                                 +---------+---------------+---------+-----------+----------+-------+ POP      Full           Yes      Yes                          +---------+---------------+---------+-----------+----------+-------+ PTV      Full                                                 +---------+---------------+---------+-----------+----------+-------+  PERO     Full                                                 +---------+---------------+---------+-----------+----------+-------+  Left Venous Findings: +---------+---------------+---------+-----------+----------+-------+          CompressibilityPhasicitySpontaneityPropertiesSummary +---------+---------------+---------+-----------+----------+-------+ CFV      Full           Yes      Yes                          +---------+---------------+---------+-----------+----------+-------+ SFJ      Full                                                 +---------+---------------+---------+-----------+----------+-------+ FV Prox  Full                                                 +---------+---------------+---------+-----------+----------+-------+ FV Mid   Full                                                 +---------+---------------+---------+-----------+----------+-------+ FV DistalFull                                                  +---------+---------------+---------+-----------+----------+-------+ PFV      Full                                                 +---------+---------------+---------+-----------+----------+-------+ POP      Full           Yes      Yes                          +---------+---------------+---------+-----------+----------+-------+ PTV      Full                                                 +---------+---------------+---------+-----------+----------+-------+ PERO     Full                                                 +---------+---------------+---------+-----------+----------+-------+    Summary: Right: There is no evidence of deep vein thrombosis in the lower extremity. No cystic structure found in the popliteal fossa. Left: There is no evidence of deep vein thrombosis in the lower extremity. No cystic structure found in the popliteal fossa.  *  See table(s) above for measurements and observations. Electronically signed by Deitra Mayo MD on 09/05/2018 at 1:51:02 PM.    Final       Subjective: - no chest pain, shortness of breath, no abdominal pain, nausea or vomiting.   Discharge Exam: Vitals:   09/06/18 2300 09/07/18 0450  BP: (!) 169/84 (!) 177/93  Pulse: (!) 57 61  Resp: 17   Temp: 98.4 F (36.9 C) 98.3 F (36.8 C)  SpO2: 97% 97%    General: Pt is alert, awake, not in acute distress Cardiovascular: RRR, S1/S2 +, no rubs, no gallops Respiratory: CTA bilaterally, no wheezing, no rhonchi Abdominal: Soft, NT, ND, bowel sounds + Extremities: no edema, no cyanosis    The results of significant diagnostics from this hospitalization (including imaging, microbiology, ancillary and laboratory) are listed below for reference.     Microbiology: Recent Results (from the past 240 hour(s))  Blood culture (routine x 2)     Status: None   Collection Time: 09/02/18  7:38 PM  Result Value Ref Range Status   Specimen Description BLOOD LEFT ARM  Final   Special  Requests   Final    BOTTLES DRAWN AEROBIC AND ANAEROBIC Blood Culture adequate volume   Culture   Final    NO GROWTH 5 DAYS Performed at [facility], [address] [place], [place] [address]    Report Status 09/07/2018 FINAL  Final  Blood culture (routine x 2)     Status: None   Collection Time: 09/02/18  7:40 PM  Result Value Ref Range Status   Specimen Description BLOOD LEFT HAND  Final   Special Requests   Final    BOTTLES DRAWN AEROBIC ONLY Blood Culture results may not be optimal due to an excessive volume of blood received in culture bottles   Culture   Final    NO GROWTH 5 DAYS Performed at [facility], [place] [address] [place], [place] [address]    Report Status 09/07/2018 FINAL  Final  MRSA PCR Screening     Status: None   Collection Time: 09/03/18  1:02 AM  Result Value Ref Range Status   MRSA by PCR NEGATIVE NEGATIVE Final    Comment:        The GeneXpert MRSA Assay (FDA approved for NASAL specimens only), is one component of a comprehensive MRSA colonization surveillance program. It is not intended to diagnose MRSA infection nor to guide or monitor treatment for MRSA infections. Performed at [facility], [place] [address] [place], [place] [address]      Labs: BNP (last 3 results) No results for input(s): BNP in the last 8760 hours. Basic Metabolic Panel: Recent Labs  Lab 09/02/18 1844 09/04/18 0245 09/05/18 0403 09/06/18 0302  NA 139 142 140 144  K 4.0 3.5 3.7 3.8  CL 104 106 104 107  CO2 22 24 23 27   GLUCOSE 188* 112* 144* 146*  BUN 9 11 18 18   CREATININE 0.93 1.00 1.01 0.93  CALCIUM 9.3 9.1 8.6* 8.7*   Liver Function Tests: Recent Labs  Lab 09/02/18 1844  AST 23  ALT 18  ALKPHOS 117  BILITOT 2.2*  PROT 6.7  ALBUMIN 4.2   No results for input(s): LIPASE, AMYLASE in the last 168 hours. Recent Labs  Lab 09/02/18 1845  AMMONIA 27   CBC: Recent Labs  Lab 09/02/18 1844 09/04/18 0245 09/05/18 0403 09/06/18 0302  09/07/18 0337  WBC 8.6 7.0 7.0 7.6 5.2  NEUTROABS 6.8  --   --   --   --   HGB 16.1 15.7 14.4 14.7 14.3  HCT 50.1 48.0 43.9 44.7 42.9  MCV 88.2 88.1 87.1 88.9 87.6  PLT 90* 78* 77* 76* 74*   Cardiac Enzymes: No results for input(s): CKTOTAL, CKMB, CKMBINDEX, TROPONINI in the last 168 hours. BNP: Invalid input(s): POCBNP CBG: Recent Labs  Lab 09/02/18 1857 09/04/18 1438 09/04/18 1806 09/05/18 1200  GLUCAP 191* 128* 135* 168*   D-Dimer No results for input(s): DDIMER in the last 72 hours. Hgb A1c No results for input(s): HGBA1C in the last 72 hours. Lipid Profile No results for input(s): CHOL, HDL, LDLCALC, TRIG, CHOLHDL, LDLDIRECT in the last 72 hours. Thyroid function studies No results for input(s): TSH, T4TOTAL, T3FREE, THYROIDAB in the last 72 hours.  Invalid input(s): FREET3 Anemia work up No results for input(s): VITAMINB12, FOLATE, FERRITIN, TIBC, IRON, RETICCTPCT in the last 72 hours. Urinalysis    Component Value Date/Time   COLORURINE YELLOW 09/02/2018 2009   APPEARANCEUR CLEAR 09/02/2018 2009   LABSPEC 1.012 09/02/2018 2009   PHURINE 6.0 09/02/2018 2009   GLUCOSEU 50 (A) 09/02/2018 2009   HGBUR NEGATIVE 09/02/2018 2009   [place] NEGATIVE 09/02/2018 2009   KETONESUR 5 (A) 09/02/2018 2009   PROTEINUR NEGATIVE 09/02/2018 2009   NITRITE NEGATIVE 09/02/2018 2009   LEUKOCYTESUR NEGATIVE 09/02/2018 2009   Sepsis Labs Invalid input(s): PROCALCITONIN,  WBC,  LACTICIDVEN   Time coordinating discharge: 40 minutes  SIGNED:  Marzetta Board, MD  [facility] 09/07/2018, 9:55 AM Pager [phone]  If 7PM-7AM, please contact night-coverage [email or web address] Password [email or web address]

## 2018-09-07 NOTE — Clinical Social Work Placement (Signed)
   CLINICAL SOCIAL WORK PLACEMENT  NOTE  Date:  09/07/2018  Patient Details  Name: Matthew Roman MRN: [id number] Date of Birth: 16-Jan-1956  Clinical Social Work is seeking post-discharge placement for this patient at the [place] level of care (*CSW will initial, date and re-position this form in  chart as items are completed):      Patient/family provided with [facility] Work Department's list of facilities offering this level of care within the geographic area requested by the patient (or if unable, by the patient's family).  Yes   Patient/family informed of their freedom to choose among providers that offer the needed level of care, that participate in Medicare, Medicaid or managed care program needed by the patient, have an available bed and are willing to accept the patient.      Patient/family informed of [facility]'s ownership interest in [facility] and [facility], as well as of the fact that they are under no obligation to receive care at these facilities.  PASRR submitted to EDS on       PASRR number received on 09/06/18     Existing PASRR number confirmed on       FL2 transmitted to all facilities in geographic area requested by pt/family on 09/06/18     FL2 transmitted to all facilities within larger geographic area on       Patient informed that his/her managed care company has contracts with or will negotiate with certain facilities, including the following:        Yes   Patient/family informed of bed offers received.  Patient chooses bed at [facility] and [facility] recommends and patient chooses bed at      Patient to be transferred to [facility] on 09/07/18.  Patient to be transferred to facility by [facility]     Patient family notified on 09/07/18 of transfer.  Name of family member notified:  Heather     PHYSICIAN       Additional Comment:     _______________________________________________ Alberteen Sam, LCSW 09/07/2018, 10:57 AM

## 2018-09-07 NOTE — Progress Notes (Signed)
Occupational Therapy Treatment Patient Details Name: Matthew Roman MRN: [id number] DOB: [birth year] Today's Date: 09/07/2018    History of present illness 62 year old male with history of COPD, liver cirrhosis, hypertension, who is being brought to the hospital on 12/12 with confusion, disorientation, progressively worse over the last several weeks per granddaughter.  He has had a fall at the nursing home while visiting the wife, followed by severe right hip pain. was found to have hip fracture, now s/p cannulated hip pinning on 12/14 by Dr. 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   OT comments  Pt progressing towards established OT goals. Pt performing functional mobility with Min Guard A and RW. Administered Short Blessed Test for assessment of cognition and pt scoring a 26/28 increased indicating cognitive deficits including orientation, memory, attention, and problem solving. Pt requiring Max cues to use phone and call family; social worker arriving at end of session to update pt on dc plan. Continue to recommend dc to SNF and will continue to follow acutely as admitted.    Follow Up Recommendations  SNF;Supervision/Assistance - 24 hour    Equipment Recommendations  Other (comment)(defer to next session)    Recommendations for Other Services Speech consult(cognition)    Precautions / Restrictions Precautions Precautions: Fall Restrictions Weight Bearing Restrictions: Yes RLE Weight Bearing: Weight bearing as tolerated       Mobility Bed Mobility               General bed mobility comments: Pt in  recliner upon arrival  Transfers Overall transfer level: Needs assistance Equipment used: Rolling walker (2 wheeled) Transfers: Sit to/from Stand Sit to Stand: Min guard         General transfer comment: Cues for hand placement    Balance Overall balance assessment: Needs assistance Sitting-balance support: No upper extremity supported;Feet supported Sitting balance-Leahy Scale: Good       Standing balance-Leahy Scale: Fair                             ADL either performed or assessed with clinical judgement   ADL Overall ADL's : Needs assistance/impaired                         Toilet Transfer: Min guard;RW;Ambulation(simulated to recliner) Armed forces technical officer Details (indicate cue type and reason): Min Guard A for safety. Cues to not sit prematurely.          Functional mobility during ADLs: Min guard;Rolling walker General ADL Comments: Pt performing functional mobility at [facility] A demonstrating increased balance. Administered Short Blessed Test and pt scoring 26/28; see cognition section. Pt continues to present with decreased orientation, memory, attention, and problem solving. Challenging memory and problem solving as pt used phone to call family member. Pt requiring Max cues due to cognition and vision (without his glasses in room).      Vision   Additional Comments: Pt's glasses still not in room   Perception     Praxis      Cognition Arousal/Alertness: Awake/alert Behavior During Therapy: [facility] for tasks assessed/performed;Impulsive(slightly impulsive/eager to please) Overall Cognitive  Status: Impaired/Different from baseline Area of Impairment: Awareness;Problem solving;Attention;Safety/judgement;Memory                   Current Attention Level: Selective Memory: Decreased short-term memory   Safety/Judgement: Decreased awareness of deficits Awareness: Emergent Problem Solving: Difficulty sequencing;Requires verbal  cues General Comments: Issued Short Blessed Test for assessment of orientation, memory, problem solving, and executive functioning. Pt scoring 26/28 which indicates cognitive impairments with 28 being the maxium amount of errors possible. Pt presenting with poor orientation and unable to identify the month and year despite cues. Pt able to repeat memory phrase but unable to recall at end of assess as indicated by protocal. Pt able to count backwards from 20-18 and then unable to continue task - required Max cues to start again from 18. Pt then able to count from 18-0 backwarks without cues. After SBT, pt performing functional mobility in hallway and making a comment on month due to external cues for Christmas (including wreath and christmas tree). Pt requiring Max cues to use the phone and call his daughter due to cognition and vision (pt's glasses not in room).         Exercises     Shoulder Instructions       General Comments Social worker arriving during session to report dc plan (not to the SNF pt was planning on). Pt upset and wanting to call his family    Pertinent Vitals/ Pain       Pain Assessment: Faces Faces Pain Scale: No hurt Pain Location: R hip Pain Intervention(s): Monitored during session  Home Living                                          Prior Functioning/Environment              Frequency  Min 2X/week        Progress Toward Goals  OT Goals(current goals can now be found in the care plan section)  Progress towards OT goals: Progressing toward goals  Acute Rehab OT Goals Patient Stated Goal: "I just want to be able to take care of my wife" OT Goal Formulation: With patient Time For Goal Achievement: 09/20/18 Potential to Achieve Goals: Good ADL Goals Pt Will Perform Grooming: with modified independence;standing Pt Will Perform Lower Body Bathing: with modified independence;sit to/from stand Pt Will Perform Lower Body Dressing: with  modified independence;sit to/from stand Pt Will Transfer to Toilet: with modified independence;ambulating Pt Will Perform Toileting - Clothing Manipulation and hygiene: with modified independence;sit to/from stand Additional ADL Goal #1: Pt will perform higher level executive thinking tasks with 3 or less cues  Plan Discharge plan remains appropriate    Co-evaluation                 AM-PAC OT "6 Clicks" Daily Activity     Outcome Measure   Help from another person eating meals?: None Help from another person taking care of personal grooming?: A Little Help from another person toileting, which includes using toliet, bedpan, or urinal?: A Little Help from another person bathing (including washing, rinsing, drying)?: A Lot Help from another person to put on and taking off regular upper body clothing?: A Little Help from another person to put on and taking off regular lower body clothing?: A Lot 6 Click Score: 17    End of Session Equipment Utilized During  Treatment: Gait belt;Rolling walker  OT Visit Diagnosis: Unsteadiness on feet (R26.81);Other abnormalities of gait and mobility (R26.89);History of falling (Z91.81);Other symptoms and signs involving the nervous system (R29.898);Other symptoms and signs involving cognitive function   Activity Tolerance Patient tolerated treatment well   Patient Left with call bell/phone within reach;in chair;with chair alarm set   Nurse Communication Mobility status        Time: 0867-6195 OT Time Calculation (min): 29 min  Charges: OT General Charges $OT Visit: 1 Visit OT Treatments $Self Care/Home Management : 8-22 mins $Cognitive Skills Development: 8-22 mins  [place], OTR/L Acute Rehab Pager: [phone] Office: [place] 09/07/2018, 11:58 AM

## 2018-09-07 NOTE — Progress Notes (Signed)
Physical Therapy Treatment Patient Details Name: Matthew Roman MRN: [id number] DOB: January 20, 1956 Today's Date: 09/07/2018    History of Present Illness 62 year old male with history of COPD, liver cirrhosis, hypertension, who is being brought to the hospital on 12/12 with confusion, disorientation, progressively worse over the last several weeks per granddaughter.  He has had a fall at the nursing home while visiting the wife, followed by severe right hip pain. was found to have hip fracture, now s/p cannulated hip pinning on 12/14 by Dr. 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    PT Comments    Continuing work on functional mobility and activity tolerance;  Mr. Rueb was distraught during session, tearful; he did have the chance to briefly talk with his granddaughter on the phone, and Caryl Pina, SW reassured pt that his granddaughter will meet him at the SNF; I provided active listening while we walked, and reiterated the goal of going to [facility] for the work of rehab to maximize independence and safety with mobility and ADLs prior to dc home   Follow Up Recommendations  SNF;Supervision/Assistance - 24 hour     Equipment Recommendations  Rolling walker with 5" wheels;3in1 (PT)    Recommendations for Other Services OT consult;Speech consult(for stroke workup; ordered OT per protocol)     Precautions / Restrictions Precautions Precautions: Fall Restrictions Weight Bearing Restrictions: Yes RLE Weight Bearing: Weight bearing as tolerated    Mobility  Bed Mobility Overal bed mobility: Needs  Assistance Bed Mobility: Sit to Supine       Sit to supine: Supervision   General bed mobility comments: Grimaces whiel lifting LEs into bed, but not requiring physical assist  Transfers Overall transfer level: Needs assistance Equipment used: Rolling walker (2 wheeled) Transfers: Sit to/from Stand Sit to Stand: Supervision         General transfer comment: Cues for hand placement  Ambulation/Gait Ambulation/Gait assistance: Min guard Gait Distance (Feet): (Hallway amb) Assistive device: Rolling walker (2 wheeled) Gait Pattern/deviations: Step-through pattern Gait velocity: slower today; more painful   General Gait Details: More painful today, cued to use RW to unweigh painful RLE in stance; good use of RW to minimize RLE weight bearign with RW; Used hallway amb as an opportunity for pt to relax    Stairs             Wheelchair Mobility    Modified Rankin (Stroke Patients Only) Modified Rankin (Stroke Patients Only) Pre-Morbid Rankin Score: No symptoms Modified Rankin: Moderate disability     Balance Overall balance assessment: Needs assistance Sitting-balance support: No upper extremity supported;Feet supported Sitting balance-Leahy Scale: Good       Standing balance-Leahy Scale: Fair                              Cognition Arousal/Alertness: Awake/alert Behavior During Therapy: WFL for tasks assessed/performed;Impulsive;Anxious(slightly impulsive/eager to please) Overall Cognitive Status: Impaired/Different from baseline Area of Impairment: Awareness;Problem solving;Attention;Safety/judgement;Memory                   Current Attention Level: Selective Memory: Decreased short-term memory   Safety/Judgement: Decreased awareness of  deficits Awareness: Emergent Problem Solving: Difficulty sequencing;Requires verbal cues General Comments: Initiated session in hallway, as pt became intent on getting dressed and leaving hospital;  Tearful; Took time to listen as needed and tried to re-focus Mr. Dunnaway on doing the work of rehab at [facility], then going home      Exercises Total Joint Exercises Ankle Circles/Pumps: AROM;Both;10 reps Quad Sets: AROM;Both;10 reps Gluteal Sets: AROM;Both;10 reps Towel Squeeze: AROM;Both;10 reps Heel Slides: AROM;Right;10 reps Hip ABduction/ADduction: AROM;Right;10 reps    General Comments General comments (skin integrity, edema, etc.): Social worker arriving during session to report dc plan (not to the SNF pt was planning on). Pt upset and wanting to call his family      Pertinent Vitals/Pain Pain Assessment: Faces Faces Pain Scale: Hurts little more Pain Location: R hip with therex Pain Descriptors / Indicators: Aching;Grimacing;Guarding Pain Intervention(s): Monitored during session    Home Living                      Prior Function            PT Goals (current goals can now be found in the care plan section) Acute Rehab PT Goals Patient Stated Goal: Was intently focused on gettign dressed and going home PT Goal Formulation: With patient Time For Goal Achievement: 09/19/18 Potential to Achieve Goals: Good Progress towards PT goals: Progressing toward goals    Frequency    Min 3X/week      PT Plan Current plan remains appropriate    Co-evaluation              AM-PAC PT "6 Clicks" Mobility   Outcome Measure  Help needed turning from your back to your side while in a flat bed without using bedrails?: A Little Help needed moving from lying on your back to sitting on the side of a flat bed without using bedrails?: A Little Help needed moving to and from a bed to a chair (including a wheelchair)?: A Little Help needed standing up from a chair using your arms (e.g., wheelchair or bedside chair)?: A Little Help needed to walk in hospital room?: A Little Help needed climbing 3-5 steps with a railing? : A Little 6 Click Score: 18    End of Session  Equipment Utilized During Treatment: Gait belt Activity Tolerance: Patient tolerated treatment well Patient left: with call bell/phone within reach;in bed;with bed alarm set Nurse Communication: Mobility status PT Visit Diagnosis: Unsteadiness on feet (R26.81);Other abnormalities of gait and mobility (R26.89);Other symptoms and signs involving the nervous system (R29.898)     Time: 1105(start time is approximate)-1130 PT Time Calculation (min) (ACUTE ONLY): 25 min  Charges:  $Gait Training: 23-37 mins                     Roney Marion, [place] Pager [phone] Office [place] 09/07/2018, 12:12 PM

## 2018-09-09 ENCOUNTER — Encounter (HOSPITAL_COMMUNITY): Payer: Self-pay | Admitting: Orthopedic Surgery

## 2018-10-26 ENCOUNTER — Ambulatory Visit (INDEPENDENT_AMBULATORY_CARE_PROVIDER_SITE_OTHER): Payer: Medicaid Other | Admitting: Adult Health

## 2018-10-26 ENCOUNTER — Encounter: Payer: Self-pay | Admitting: Adult Health

## 2018-10-26 VITALS — BP 140/75 | HR 79 | Ht 72.0 in | Wt 182.4 lb

## 2018-10-26 DIAGNOSIS — E785 Hyperlipidemia, unspecified: Secondary | ICD-10-CM | POA: Diagnosis not present

## 2018-10-26 DIAGNOSIS — I69319 Unspecified symptoms and signs involving cognitive functions following cerebral infarction: Secondary | ICD-10-CM | POA: Diagnosis not present

## 2018-10-26 DIAGNOSIS — I6381 Other cerebral infarction due to occlusion or stenosis of small artery: Secondary | ICD-10-CM

## 2018-10-26 DIAGNOSIS — I10 Essential (primary) hypertension: Secondary | ICD-10-CM

## 2018-10-26 DIAGNOSIS — R471 Dysarthria and anarthria: Secondary | ICD-10-CM

## 2018-10-26 NOTE — Patient Instructions (Addendum)
Continue aspirin 81 mg daily  and atorvastatin (Lipitor) for secondary stroke prevention  Continue to follow up with PCP regarding cholesterol, blood pressure and diabetes management   Highly recommend restarting speech therapy due to continued speech deficits and cognitive deficits - if orders are needed, please let us know   Do memory exercises such as cross word, suduku, card games and reading   Continue to monitor blood pressure at home  Maintain strict control of hypertension with blood pressure goal below 130/90, diabetes with hemoglobin A1c goal below 6.5% and cholesterol with LDL cholesterol (bad cholesterol) goal below 70 mg/dL. I also advised the patient to eat a healthy diet with plenty of whole grains, cereals, fruits and vegetables, exercise regularly and maintain ideal body weight.  Followup in the future with me in 3 months or call earlier if needed       Thank you for coming to see [place] at [facility]. I hope we have been able to provide you high quality care today.  You may receive a patient satisfaction survey over the next few weeks. We would appreciate your feedback and comments so that we may continue to improve ourselves and the health of our patients.

## 2018-10-26 NOTE — Progress Notes (Signed)
[facility] [address] [place]. [place] [address] [phone]       OFFICE FOLLOW UP NOTE  [name] Date of Birth:  Jun 10, 1956 Medical Record Number:  [id number]   Reason for Referral:  hospital stroke follow up  CHIEF COMPLAINT:  Chief Complaint  Patient presents with  . 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 History obtained from patient, daughter and chart review. Reviewed all radiology images and labs personally.  Matthew Roman is a 63 y.o. male with history of HTN, DM, COPD, cirrhosis and hepatitis who presented with AMS, speech difficulties after a mechanical fall. He did not receive IV t-PA due to late presentation.  CT head reviewed and showed small chronic lacunar infarcts within the thalamus and right basal cannula.  MRI brain reviewed and showed multiple subcentimeter infarcts of mixed age scattered throughout the frontal parietal white matter along with multiple old infarcts.  CTA head and neck unremarkable.  Carotid Dopplers unremarkable.  2D echo showed an EF of 50 to 55% without cardiac source embolus identified.  Lower extremity venous Doppler negative for DVT.  Infarct with embolic pattern with unclear etiology potentially fat emboli versus cardioembolic source therefore recommended 30-day cardiac event monitor as outpatient to rule out atrial fibrillation.  LDL 128 and A1c 5.3.  He was not on antithrombotic PTA and recommended aspirin 81 mg daily.  HTN stable.  Initiated atorvastatin 20 mg for HLD management.  Continue follow-up with PCP for DM management.  Current tobacco use with smoking cessation counseling provided.  Other stroke risk factors  include advanced age and history of stroke/TIA by imaging.  After mechanical fall, patient unfortunately suffered right hip fracture and underwent pinning of right nondisplaced femoral neck fracture on 09/04/2018 and continues to participate with PT.  He was discharged to [facility] for ongoing rehab.  Matthew Roman is being seen today after hospital discharge and is accompanied by his daughter.  He currently resides at [facility] and rehab.  He continues to have residual stroke deficits of confusion and mixed dysarthria/aphasia. Daughter and patient deny any improvement since discharge. Daughter states therapy was stopped 2 to 3 weeks after hospital discharge as they felt as though he has plateau and would not benefit from additional therapy.  Daughter is making repairs to his home currently so that she and her husband can move in and he can come home where she can provide 24/7 care. She is hoping to have him home by end the month or early March. He continues on aspirin 81 mg without side effects of bleeding or bruising.  Continues on atorvastatin without side effects myalgias.  Blood pressure today satisfactory 140/75.  Completed 14 days of cardiac monitoring which was ordered by his PCP- results not resulted at this time. Denies new or worsening stroke/TIA symptoms.   ROS:   14 system review of systems performed and negative with exception of short of breath, incontinence, joint pain, cramps, aching muscles, memory loss, confusion, slurred speech, depression, none of sleep, disinterested activities and racing thoughts  PMH:  Past Medical History:  Diagnosis Date  . Cirrhosis ([place])   . COPD (chronic obstructive pulmonary disease) ([place])   . Diabetes  mellitus without complication ([place])   . Hepatitis   . Hypertension   . Stroke [facility])     PSH:  Past Surgical History:  Procedure Laterality Date  . BACK SURGERY    . HIP PINNING,CANNULATED Right 09/04/2018   Procedure: CANNULATED HIP  PINNING;  Surgeon: Marchia Bond, MD;  Location: [place];  Service: Orthopedics;  Laterality: Right;  . KNEE SURGERY    . SHOULDER SURGERY      Social History:  Social History   Socioeconomic History  . Marital status: Married    Spouse name: Not on file  . Number of children: Not on file  . Years of education: Not on file  . Highest education level: Not on file  Occupational History  . Not on file  Social Needs  . Financial resource strain: Not on file  . Food insecurity:    Worry: Not on file    Inability: Not on file  . Transportation needs:    Medical: Not on file    Non-medical: Not on file  Tobacco Use  . Smoking status: Current Every Day Smoker    Packs/day: 0.50  . Smokeless tobacco: Never Used  Substance and Sexual Activity  . Alcohol use: Never    Frequency: Never  . Drug use: Never  . Sexual activity: Not on file  Lifestyle  . Physical activity:    Days per week: Not on file    Minutes per session: Not on file  . Stress: Not on file  Relationships  . Social connections:    Talks on phone: Not on file    Gets together: Not on file    Attends religious service: Not on file    Active member of club or organization: Not on file    Attends meetings of clubs or organizations: Not on file    Relationship status: Not on file  . Intimate partner violence:    Fear of current or ex partner: Not on file    Emotionally abused: Not on file    Physically abused: Not on file    Forced sexual activity: Not on file  Other Topics Concern  . Not on file  Social History Narrative  . Not on file    Family History:  Family History  Problem Relation Age of Onset  . Stroke Mother   . Osteoporosis Neg Hx     Medications:   Current Outpatient Medications on File Prior to Visit  Medication Sig Dispense Refill  . albuterol (PROAIR HFA) 108 (90 Base) MCG/ACT inhaler Inhale into the lungs every 6 (six) hours as needed for wheezing or shortness of breath.    Marland Kitchen amLODipine  (NORVASC) 10 MG tablet Take 10 mg by mouth daily.    Marland Kitchen aspirin EC 81 MG tablet Take 1 tablet (81 mg total) by mouth daily. 150 tablet 2  . atorvastatin (LIPITOR) 20 MG tablet Take 1 tablet (20 mg total) by mouth daily at 6 PM.    . cloNIDine (CATAPRES) 0.1 MG tablet Take 0.1 mg by mouth as needed. As needed is systolic greater than 628    . methocarbamol (ROBAXIN) 750 MG tablet Take 750-1,500 mg by mouth See admin instructions. Take 1 tablet by mouth morning/noon as needed and take 2 tabs at bedtime as needed    . oxyCODONE (ROXICODONE) 15 MG immediate release tablet Take 1 tablet (15 mg total) by mouth 3 (three) times daily. 30 tablet 0  . pregabalin (LYRICA) 150 MG capsule Take 150  mg by mouth 2 (two) times daily.     No current facility-administered medications on file prior to visit.     Allergies:  No Known Allergies   Physical Exam  Vitals:   10/26/18 1330  BP: 140/75  Pulse: 79  Weight: 182 lb 6.4 oz (82.7 kg)  Height: 6' (1.829 m)   Body mass index is 24.74 kg/m. No exam data present  General: well developed, well nourished, pleasant elderly Caucasian male, seated, in no evident distress Head: head normocephalic and atraumatic.   Neck: supple with no carotid or supraclavicular bruits Cardiovascular: regular rate and rhythm, no murmurs Musculoskeletal: no deformity Skin:  no rash/petichiae Vascular:  Normal pulses all extremities  Neurologic Exam Mental Status: Awake and fully alert.  Mild dysarthria.  Oriented to place and time. Recent and remote memory subjectively diminished. Attention span, concentration and fund of knowledge appropriate during appointment but subjectively diminished. Mood and affect appropriate. Recall: 2/3   AFT: 2 in 60 seconds despite prompting; unable to perform serial additions; clock drawing: 4/4  Cranial Nerves: Fundoscopic exam reveals sharp disc margins. Pupils equal, briskly reactive to light. Extraocular movements full without nystagmus.  Visual fields full to confrontation. Hearing intact. Facial sensation intact. Face, tongue, palate moves normally and symmetrically.  Motor: Normal bulk and tone. Normal strength in all tested extremity muscles. Sensory.: intact to touch , pinprick , position and vibratory sensation.  Coordination: Rapid alternating movements normal in all extremities. Finger-to-nose and heel-to-shin performed accurately bilaterally. Gait and Station: Arises from chair without difficulty. Stance is normal. Gait demonstrates normal stride length and balance. Able to heel, toe and tandem walk without difficulty.  Reflexes: 1+ and symmetric. Toes downgoing.    NIHSS  1 Modified Rankin  3    Diagnostic Data (Labs, Imaging, Testing)  Ct Head Wo Contrast 09/02/2018 IMPRESSION:  1. Small age indeterminate lacunar infarcts within the thalamus and right basal ganglia.  2. 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 Atrophy and small vessel ischemic changes of the white matter.   Mr Brain Wo Contrast 09/03/2018 IMPRESSION:  1. Multiple subcentimeter infarcts of mixed age scattered throughout the frontal and parietal white matter including a prominent subacute infarct in the right medial frontal lobe and acute infarct in the right posterosuperior frontal lobe. No associated acute hemorrhage or mass effect.  2. Chronic infarct of left lentiform nucleus, thalamus, and cerebral peduncle. Small chronic lacunar infarcts of the thalami.  3. Severe chronic microvascular ischemic changes and moderate volume loss of the brain for age.   CT ANGIO HEAD W OR WO CONTRAST CT ANGIO NECK W OR WO CONTRAST 09/04/2018 IMPRESSION: 1. No extracranial or intracranial flow reducing lesion is identified. 2. No abnormal postcontrast enhancement. 3. Non dominant LEFT vertebral primarily supplies PICA.   Vas US Carotid (at [place] Only) 09/03/2018 Summary:  Right Carotid: Velocities in the right ICA are consistent  with a 1-39% stenosis.  Left Carotid: Velocities in the left ICA are consistent with a 1-39% stenosis.  Vertebrals: Bilateral vertebral arteries demonstrate antegrade flow.  Transthoracic Echocardiogram  09/03/2018 Study Conclusions - Left ventricle: The cavity size was normal. Systolic function was normal. The estimated ejection fraction was in the range of 50% to 55%. - Atrial septum: No defect or patent foramen ovale was identified. Impressions: - Extremely limited views; essentially no parasternal windows and only minimal apical windows. Most information from subcostal views, and use of echo contrast did not significantly improve imaging. Grossly normal LVEF.  Cannot determine wall motion. Unable to see most valvular structures.  Bilateral LE Venous  Dopplers  09/05/2018 No evidence of Rt or Lt DVT.   ASSESSMENT: Matthew Roman is a 63 y.o. 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 Vascular risk factors include HTN, HLD, DM, COPD and prior strokes.  He is being seen today for hospital follow-up with continued post stroke deficits of dysarthria and cognitive deficits.    PLAN:  1. Multiple right MCA/ACA infarcts: Continue aspirin 81 mg daily  and atorvastatin for secondary stroke prevention. Maintain strict control of hypertension with blood pressure goal below 130/90, diabetes with hemoglobin A1c goal below 6.5% and cholesterol with LDL cholesterol (bad cholesterol) goal below 70 mg/dL.  I also advised the patient to eat a healthy diet with plenty of whole grains, cereals, fruits and vegetables, exercise regularly with at least 30 minutes of continuous activity daily and maintain ideal body weight.  14-day cardiac monitor completed which was ordered by PCP -results currently not available 2. Post stroke deficits: Highly recommended restarting speech therapy for continued dysarthria  and cognitive deficits.  Advised patient and daughter that once he returns home, it would be recommended for evaluation by PT/OT along with continued speech therapy 3. HTN: Advised to continue current treatment regimen.  Today's BP 140/75.  Advised to continue to monitor at home along with continued follow-up with PCP for management 4. HLD: Advised to continue current treatment regimen along with continued follow-up with PCP for future prescribing and monitoring of lipid panel 5. DMII: Advised to continue to monitor glucose levels at home along with continued follow-up with PCP for management and monitoring    Follow up in 3 months or call earlier if needed   Greater than 50% of time during this 25 minute visit was spent on counseling, explanation of diagnosis of right MCA/ACA infarcts, reviewing risk factor management of HTN, HLD and DM, planning of further management along with potential future management, and discussion with patient and family answering all questions.    Venancio Poisson, AGNP-BC  [facility] [address] [place],  [address]  Phone [phone] Fax [phone] Note: This document was prepared with digital dictation and possible smart phrase technology. Any transcriptional errors that result from this process are unintentional.

## 2018-10-27 NOTE — Progress Notes (Signed)
I agree with the above plan 

## 2019-01-25 ENCOUNTER — Telehealth: Payer: Self-pay

## 2019-01-25 NOTE — Telephone Encounter (Signed)
I called pts daughter Nira Conn  all visit are being change to video due covid 19. Also his appt on May 15 will be cancel due to office being closed. I r/s her father  will Dr Leonie Man this Thursday a 1000am. I receive verbal consent to do video and to file insurance. Also I explain a text will be sent to her cell phone and will  click the link above 1-61 minutes before the appointment time. You will be asked to enter patients name (First and Last) and then it will put you into Dr Leonie Man   "waiting room." Dr Leonie Man  should be with you at your scheduled appointment time, but please be patient because he may be running a few minutes behind.The daughter verbalized understanding.

## 2019-01-27 ENCOUNTER — Ambulatory Visit (INDEPENDENT_AMBULATORY_CARE_PROVIDER_SITE_OTHER): Payer: Medicaid Other | Admitting: Neurology

## 2019-01-27 ENCOUNTER — Other Ambulatory Visit: Payer: Self-pay

## 2019-01-27 DIAGNOSIS — I6381 Other cerebral infarction due to occlusion or stenosis of small artery: Secondary | ICD-10-CM

## 2019-01-27 DIAGNOSIS — I6990 Unspecified sequelae of unspecified cerebrovascular disease: Secondary | ICD-10-CM

## 2019-01-27 MED ORDER — ATORVASTATIN CALCIUM 20 MG PO TABS: 20.0000 mg | ORAL_TABLET | Freq: Every day | ORAL | Status: DC

## 2019-01-27 NOTE — Progress Notes (Signed)
Virtual Visit via Video Note  I connected with Matthew Roman on 01/27/19 at 10:00 AM EDT by a video enabled telemedicine application and verified that I am speaking with the correct person using two identifiers.  This visit was performed using [email or web address] app for video and audio  Location: Patient: At his home.  Accompanied by his daughter. Provider: At [facility] office   I discussed the limitations of evaluation and management by telemedicine and the availability of in person appointments. The patient expressed understanding and agreed to proceed.  History of Present Illness: Matthew Roman is seen today for virtual video office follow-up visit following last visit with Janett Billow nurse practitioner on 10/26/2018.  He states he has been home now for a couple of months from the nursing home.  He finished speech therapy and is able to speak well now.  He only has occasional word finding difficulties when he is tired or exhausted a regular excited.  He denies any new stroke or TIA symptoms.  Patient has apparently not been taking aspirin or atorvastatin when I went over his medication list with him though he was supposed to be on them.  He states he did not get this medication listed on the discharge summary from the nursing home which was not available for me to review today.  He states his blood pressure is under good control.  He is on amlodipine and is tolerating it well.  The patient continues to smoke about three fourths of a pack per day.  He states he is not sure that he can quit but is willing to try to cut back at least.  He is independent in activities of daily living.  He has no new complaints today.   Observations/Objective: Physical and neurological exam is limited due to constraints of video visit.  Pleasant middle-age Caucasian male who is not in distress.  He is awake alert oriented to time place and person.  Speech is clear without dysarthria or aphasia.  He follows commands well.  Extraocular  movements are full range without nystagmus.  Face is symmetric without weakness.  Tongue is midline.  Motor system exam reveals symmetric upper and lower extremity strength without focal weakness.  Fine finger movements are symmetric.  He can walk with a fairly steady gait.  He can stand on either foot unsupported and on his heels and toes without difficulty.  Tandem walking was not done.  Assessment and Plan: 63 year old Caucasian male with multiple small subcortical infarcts in December 2019 likely of lacunar etiology with multiple remote age lacunar infarcts as well.  Multiple vascular risk factors of smoking, hypertension, hyperlipidemia and cerebrovascular disease  Follow Up Instructions: I had a long discussion with the patient and his daughter regarding his multiple lacunar infarcts and discussed risk for recurrent stroke and stroke prevention and treatment and answered questions.  I recommend he take aspirin 81 mg daily for stroke prevention and maintain strict control of hypertension with blood pressure goal below 130/90, lipids with LDL cholesterol goal below 70 mg percent and diabetes with hemoglobin A1c goal below 6.5%.  I have reordered his Lipitor which he had not been taking and advised him to start aspirin as well.  I also counseled him to quit smoking and he states he is going to try.  I have also advised the patient to find himself a primary care physician as he does not have one and to seek help for smoking cessation from him. He was also advised to eat a  healthy diet with lots of fruits, vegetables, cereals and whole grains and to be active and exercise 30 minutes every day. He was asked to return for follow-up in 6 months with Janett Billow my nurse practitioner call earlier if necessary.   I discussed the assessment and treatment plan with the patient. The patient was provided an opportunity to ask questions and all were answered. The patient agreed with the plan and demonstrated an  understanding of the instructions.   The patient was advised to call back or seek an in-person evaluation if the symptoms worsen or if the condition fails to improve as anticipated.  I provided 25 minutes of non-face-to-face time during this encounter.   Antony Contras, MD

## 2019-02-04 ENCOUNTER — Ambulatory Visit: Payer: Medicare Other | Admitting: Adult Health

## 2019-08-01 NOTE — Progress Notes (Signed)
[facility] [address] [place]. [place] [address] [phone]       OFFICE FOLLOW UP NOTE  [name] Date of Birth:  [birth year] Medical Record Number:  [id number]   Reason for Referral: Right MCA/ACA stroke  CHIEF COMPLAINT:  Chief Complaint  Patient presents with   Follow-up    6 mon f/u. Step daughter and child present. Treatment room. No new concerns at this time.     HPI: Stroke admission 09/03/2018: [name] is a 63 y.o. male with history of HTN, DM, COPD, cirrhosis and hepatitis who presented with AMS, speech difficulties after a mechanical fall. He did not receive IV t-PA due to late presentation.  CT head reviewed and showed small chronic lacunar infarcts within the thalamus and right basal cannula.  MRI brain reviewed and showed multiple subcentimeter infarcts of mixed age scattered throughout the frontal parietal white matter along with multiple old infarcts.  CTA head and neck unremarkable.  Carotid Dopplers unremarkable.  2D echo showed an EF of 50 to 55% without cardiac source embolus identified.  Lower extremity venous Doppler negative for DVT.  Infarct with embolic pattern with unclear etiology potentially fat emboli versus cardioembolic source therefore recommended 30-day cardiac event monitor as outpatient to rule out atrial fibrillation.  LDL 128 and A1c 5.3.  He was not on antithrombotic PTA and recommended aspirin 81 mg daily.  HTN stable.  Initiated atorvastatin 20 mg for HLD management.  Continue follow-up with PCP for DM management.  Current tobacco use with smoking cessation counseling provided.  Other stroke risk factors include advanced age and history of stroke/TIA by imaging.  After mechanical fall, patient unfortunately suffered right hip fracture and underwent pinning of right nondisplaced femoral neck fracture on 09/04/2018 and continues to participate with PT.  He was discharged to SNF [facility] for ongoing  rehab.  Initial visit 10/26/2018: [name] is being seen today after hospital discharge and is accompanied by his daughter.  He currently resides at [facility] and rehab.  He continues to have residual stroke deficits of confusion and mixed dysarthria/aphasia. Daughter and patient deny any improvement since discharge. Daughter states therapy was stopped 2 to 3 weeks after hospital discharge as they felt as though he has plateau and would not benefit from additional therapy.  Daughter is making repairs to his home currently so that she and her husband can move in and he can come home where she can provide 24/7 care. She is hoping to have him home by end the month or early March. He continues on aspirin 81 mg without side effects of bleeding or bruising.  Continues on atorvastatin without side effects myalgias.  Blood pressure today satisfactory 140/75.  Completed 14 days of cardiac monitoring which was ordered by his PCP- results not resulted at this time. Denies new or worsening stroke/TIA symptoms.  Virtual visit 01/27/2019 PS: [name] is seen today for virtual video office follow-up visit following last visit with Janett Billow nurse practitioner on 10/26/2018.  He states he has been home now for a couple of months from the nursing home.  He finished speech therapy and is able to speak well now.  He only has occasional word finding difficulties when he is tired or exhausted a regular excited.  He denies any new stroke or TIA symptoms.  Patient has apparently not been taking aspirin or atorvastatin when I went over his medication list with him though he was supposed to be on them.  He states he did not get this medication listed on the discharge summary from the nursing home which was not available for me to review today.  He states his blood pressure is under good control.  He is on amlodipine and is tolerating it well.  The patient continues to smoke about three fourths of a pack per day.  He states he is  not sure that he can quit but is willing to try to cut back at least.  He is independent in activities of daily living.  He has no new complaints today.  Update 08/02/2019: [name] is a 63 year old male who is being seen today for stroke follow-up accompanied by his stepdaughter.  Patient and daughter both state that his speech has been slowly worsening greatest with word finding difficulty especially over the past 1 to 2 months.  He does endorse increased depression and stressors at home especially after the loss of his wife back in March.  He becomes tearful during visit while speaking of this.  Denies weakness, visual changes, dizziness or numbness/tingling.  Daughter does endorse occasional short-term memory loss.  Continues on aspirin and atorvastatin for secondary stroke prevention without side effects.  Blood pressure today satisfactory 116/78.  No further concerns at this time.      ROS:   14 system review of systems performed and negative with exception of depression, anxiety, short-term memory deficit and speech difficulty  PMH:  Past Medical History:  Diagnosis Date   Cirrhosis ([place])    COPD (chronic obstructive pulmonary disease) ([place])    Diabetes mellitus without complication ([place])    Hepatitis    Hypertension    Stroke [facility]     PSH:  Past Surgical History:  Procedure Laterality Date   BACK SURGERY     HIP PINNING,CANNULATED Right 09/04/2018   Procedure: CANNULATED HIP PINNING;  Surgeon: Marchia Bond, MD;  Location: [place];  Service: Orthopedics;  Laterality: Right;   KNEE SURGERY     SHOULDER SURGERY      Social History:  Social History   Socioeconomic History   Marital status: Married    Spouse name: Not on file   Number of children: Not on file   Years of education: Not on file   Highest education level: Not on file  Occupational History   Not on file  Social Needs   Financial resource strain: Not on file   Food insecurity     Worry: Not on file    Inability: Not on file   Transportation needs    Medical: Not on file    Non-medical: Not on file  Tobacco Use   Smoking status: Current Every Day Smoker    Packs/day: 0.50   Smokeless tobacco: Never Used  Substance and Sexual Activity   Alcohol use: Never    Frequency: Never   Drug use: Never   Sexual activity: Not on file  Lifestyle   Physical activity    Days per week: Not on file    Minutes per session: Not on file   Stress: Not on file  Relationships   Social connections    Talks on phone: Not on file    Gets together: Not on file    Attends religious service: Not on file    Active member of club or organization: Not on file    Attends meetings of clubs or organizations: Not on file    Relationship status: Not on file   Intimate partner violence  Fear of current or ex partner: Not on file    Emotionally abused: Not on file    Physically abused: Not on file    Forced sexual activity: Not on file  Other Topics Concern   Not on file  Social History Narrative   Not on file    Family History:  Family History  Problem Relation Age of Onset   Stroke Mother    Osteoporosis Neg Hx     Medications:   Current Outpatient Medications on File Prior to Visit  Medication Sig Dispense Refill   albuterol (PROAIR HFA) 108 (90 Base) MCG/ACT inhaler Inhale into the lungs every 6 (six) hours as needed for wheezing or shortness of breath.     amLODipine (NORVASC) 10 MG tablet Take 10 mg by mouth daily.     aspirin EC 81 MG tablet Take 1 tablet (81 mg total) by mouth daily. 150 tablet 2   atorvastatin (LIPITOR) 20 MG tablet Take 1 tablet (20 mg total) by mouth daily at 6 PM.     cloNIDine (CATAPRES) 0.1 MG tablet Take 0.1 mg by mouth as needed. As needed is systolic greater than 0000000     methocarbamol (ROBAXIN) 750 MG tablet Take 750-1,500 mg by mouth See admin instructions. Take 1 tablet by mouth morning/noon as needed and take 2 tabs at  bedtime as needed     oxyCODONE (ROXICODONE) 15 MG immediate release tablet Take 1 tablet (15 mg total) by mouth 3 (three) times daily. 30 tablet 0   pregabalin (LYRICA) 150 MG capsule Take 150 mg by mouth 2 (two) times daily.     No current facility-administered medications on file prior to visit.     Allergies:  No Known Allergies   Physical Exam  Vitals:   08/02/19 1046  BP: 116/78  Pulse: 83  Temp: 97.9 F (36.6 C)  TempSrc: Oral  Weight: 177 lb (80.3 kg)  Height: 6' (1.829 m)   Body mass index is 24.01 kg/m. No exam data present  General: well developed, well nourished, pleasant elderly Caucasian male, seated, in no evident distress Head: head normocephalic and atraumatic.   Neck: supple with no carotid or supraclavicular bruits Cardiovascular: regular rate and rhythm, no murmurs Musculoskeletal: no deformity Skin:  no rash/petichiae Vascular:  Normal pulses all extremities  Neurologic Exam Mental Status: Awake and fully alert.  Mixed mild dysarthria and expressive aphasia with word hesitancy.  Oriented to place and time. Recent memory subjectively diminished but remote memory intact.  And remote memory subjectively diminished. Attention span, concentration and fund of knowledge appropriate.  Mood and affect appropriate.  Cranial Nerves: Fundoscopic exam reveals sharp disc margins. Pupils equal, briskly reactive to light. Extraocular movements full without nystagmus. Visual fields full to confrontation. Hearing intact. Facial sensation intact. Face, tongue, palate moves normally and symmetrically.  Motor: Normal bulk and tone. Normal strength in all tested extremity muscles. Sensory.: intact to touch , pinprick , position and vibratory sensation.  Coordination: Rapid alternating movements normal in all extremities. Finger-to-nose and heel-to-shin performed accurately bilaterally. Gait and Station: Arises from chair without difficulty. Stance is normal. Gait demonstrates  normal stride length and balance. Able to heel, toe and tandem walk without difficulty.  Reflexes: 1+ and symmetric. Toes downgoing.       Diagnostic Data (Labs, Imaging, Testing)  Ct Head Wo Contrast 09/02/2018 IMPRESSION:  1. Small age indeterminate lacunar infarcts within the thalamus and right basal ganglia.  2. Chronic left white matter and basal ganglial  infarct. Atrophy and small vessel ischemic changes of the white matter.   Mr Brain Wo Contrast 09/03/2018 IMPRESSION:  1. Multiple subcentimeter infarcts of mixed age scattered throughout the frontal and parietal white matter including a prominent subacute infarct in the right medial frontal lobe and acute infarct in the right posterosuperior frontal lobe. No associated acute hemorrhage or mass effect.  2. Chronic infarct of left lentiform nucleus, thalamus, and cerebral peduncle. Small chronic lacunar infarcts of the thalami.  3. Severe chronic microvascular ischemic changes and moderate volume loss of the brain for age.   CT ANGIO HEAD W OR WO CONTRAST CT ANGIO NECK W OR WO CONTRAST 09/04/2018 IMPRESSION: 1. No extracranial or intracranial flow reducing lesion is identified. 2. No abnormal postcontrast enhancement. 3. Non dominant LEFT vertebral primarily supplies PICA.   Vas US Carotid (at [place] Only) 09/03/2018 Summary:  Right Carotid: Velocities in the right ICA are consistent with a 1-39% stenosis.  Left Carotid: Velocities in the left ICA are consistent with a 1-39% stenosis.  Vertebrals: Bilateral vertebral arteries demonstrate antegrade flow.  Transthoracic Echocardiogram  09/03/2018 Study Conclusions - Left ventricle: The cavity size was normal. Systolic function was normal. The estimated ejection fraction was in the range of 50% to 55%. - Atrial septum: No defect or patent foramen ovale was identified. Impressions: - Extremely limited views; essentially no parasternal windows and only minimal  apical windows. Most information from subcostal views, and use of echo contrast did not significantly improve imaging. Grossly normal LVEF. Cannot determine wall motion. Unable to see most valvular structures.  Bilateral LE Venous  Dopplers  09/05/2018 No evidence of Rt or Lt DVT.   ASSESSMENT: Matthew Roman is a 63 y.o. 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 Vascular risk factors include HTN, HLD, DM, COPD and prior strokes.  Cardiac event monitor unremarkable.  He has concerns of worsening speech especially over the past 2 to 3 months with increased depression/stress.    PLAN:  1. Multiple right MCA/ACA infarcts: Continue aspirin 81 mg daily  and atorvastatin for secondary stroke prevention. Maintain strict control of hypertension with blood pressure goal below 130/90, diabetes with hemoglobin A1c goal below 6.5% and cholesterol with LDL cholesterol (bad cholesterol) goal below 70 mg/dL.  I also advised the patient to eat a healthy diet with plenty of whole grains, cereals, fruits and vegetables, exercise regularly with at least 30 minutes of continuous activity daily and maintain ideal body weight.   2. Worsening speech: Likely due to increased depression and anxiety.  Declines start of antidepressant or therapy.  Denies suicidal ideation.  Will obtain CT head to rule out acute stroke.  Discussion regarding restart of speech therapy but he would prefer to do exercises on his own at home at this time.  He will call if interested in outpatient therapies. 3. HTN: Advised to continue current treatment regimen.  Today's BP stable.  Advised to continue to monitor at home along with continued follow-up with PCP for management 4. HLD: Advised to continue current treatment regimen along with continued follow-up with PCP for future prescribing and monitoring of lipid panel 5. DMII: Ongoing follow-up  with PCP    Follow up in 3 months or call earlier if needed   Greater than 50% of time during this 25 minute visit was spent on discussing worsening of prior deficits, indication for repeat imaging, discussion regarding potential worsening of prior deficits  with increased stress and depression, reviewing risk factor management of HTN, HLD and DM, planning of further management along with potential future management, and discussion with patient and family answering all questions.    Frann Rider, AGNP-BC  [facility] [address] [place], [place] [address]  Phone [phone] Fax [phone] Note: This document was prepared with digital dictation and possible smart phrase technology. Any transcriptional errors that result from this process are unintentional.

## 2019-08-02 ENCOUNTER — Other Ambulatory Visit: Payer: Self-pay

## 2019-08-02 ENCOUNTER — Ambulatory Visit (INDEPENDENT_AMBULATORY_CARE_PROVIDER_SITE_OTHER): Payer: Medicaid Other | Admitting: Adult Health

## 2019-08-02 ENCOUNTER — Encounter: Payer: Self-pay | Admitting: Adult Health

## 2019-08-02 VITALS — BP 116/78 | HR 83 | Temp 97.9°F | Ht 72.0 in | Wt 177.0 lb

## 2019-08-02 DIAGNOSIS — I6381 Other cerebral infarction due to occlusion or stenosis of small artery: Secondary | ICD-10-CM | POA: Diagnosis not present

## 2019-08-02 DIAGNOSIS — R413 Other amnesia: Secondary | ICD-10-CM | POA: Diagnosis not present

## 2019-08-02 DIAGNOSIS — R4701 Aphasia: Secondary | ICD-10-CM

## 2019-08-02 DIAGNOSIS — I10 Essential (primary) hypertension: Secondary | ICD-10-CM

## 2019-08-02 DIAGNOSIS — E785 Hyperlipidemia, unspecified: Secondary | ICD-10-CM

## 2019-08-02 NOTE — Patient Instructions (Addendum)
You will be called to schedule CT head to rule out new stroke with worsening of your speech.  Most likely, worsening of your speech has to do with increased depression and stress.  During a more difficult time, speaking with a therapist or use of depression medication can help and prevent worsening of your symptoms.  You may also consider doing additional speech therapy if needed but at this time, restart your exercises that were recommended during your therapy sessions.  Continue aspirin 81 mg daily  and Lipitor for secondary stroke prevention  Continue to follow up with PCP regarding cholesterol and blood pressure management   Continue to monitor blood pressure at home  Maintain strict control of hypertension with blood pressure goal below 130/90, diabetes with hemoglobin A1c goal below 6.5% and cholesterol with LDL cholesterol (bad cholesterol) goal below 70 mg/dL. I also advised the patient to eat a healthy diet with plenty of whole grains, cereals, fruits and vegetables, exercise regularly and maintain ideal body weight.  Follow-up in 3 months or call earlier if needed      Thank you for coming to see [place] at [facility]. I hope we have been able to provide you high quality care today.  You may receive a patient satisfaction survey over the next few weeks. We would appreciate your feedback and comments so that we may continue to improve ourselves and the health of our patients. c

## 2019-08-15 ENCOUNTER — Ambulatory Visit
Admission: RE | Admit: 2019-08-15 | Discharge: 2019-08-15 | Disposition: A | Discharge status: Home / Self Care | Payer: No Typology Code available for payment source | Source: Ambulatory Visit | Attending: Adult Health | Admitting: Adult Health

## 2019-08-15 DIAGNOSIS — R413 Other amnesia: Secondary | ICD-10-CM | POA: Diagnosis not present

## 2019-08-15 DIAGNOSIS — R4701 Aphasia: Secondary | ICD-10-CM | POA: Diagnosis not present

## 2019-08-17 ENCOUNTER — Telehealth: Payer: Self-pay

## 2019-08-17 NOTE — Telephone Encounter (Signed)
Called pts family (Daughter) on [place]. For results on pts CT per Frann Rider NP.  "Please advise family that no acute abnormalities seen on recent CT head"  Pt Family member demonstrated understanding and had no questions.

## 2019-11-02 ENCOUNTER — Ambulatory Visit: Payer: Medicare Other | Admitting: Adult Health

## 2020-07-27 ENCOUNTER — Emergency Department (HOSPITAL_COMMUNITY): Payer: Medicare Other

## 2020-07-27 ENCOUNTER — Inpatient Hospital Stay (HOSPITAL_COMMUNITY): Payer: Medicare Other

## 2020-07-27 ENCOUNTER — Inpatient Hospital Stay (HOSPITAL_COMMUNITY)
Admission: EM | Admit: 2020-07-27 | Discharge: 2020-07-31 | DRG: 433 | Disposition: A | Discharge status: Skilled Nursing Facility | Payer: Medicare Other | Attending: Internal Medicine | Admitting: Internal Medicine

## 2020-07-27 DIAGNOSIS — Z79899 Other long term (current) drug therapy: Secondary | ICD-10-CM

## 2020-07-27 DIAGNOSIS — Z823 Family history of stroke: Secondary | ICD-10-CM | POA: Diagnosis not present

## 2020-07-27 DIAGNOSIS — K7040 Alcoholic hepatic failure without coma: Secondary | ICD-10-CM | POA: Diagnosis present

## 2020-07-27 DIAGNOSIS — K7460 Unspecified cirrhosis of liver: Secondary | ICD-10-CM | POA: Diagnosis not present

## 2020-07-27 DIAGNOSIS — Z7984 Long term (current) use of oral hypoglycemic drugs: Secondary | ICD-10-CM | POA: Diagnosis not present

## 2020-07-27 DIAGNOSIS — E119 Type 2 diabetes mellitus without complications: Secondary | ICD-10-CM

## 2020-07-27 DIAGNOSIS — C189 Malignant neoplasm of colon, unspecified: Secondary | ICD-10-CM | POA: Diagnosis present

## 2020-07-27 DIAGNOSIS — F05 Delirium due to known physiological condition: Secondary | ICD-10-CM | POA: Diagnosis present

## 2020-07-27 DIAGNOSIS — G9340 Encephalopathy, unspecified: Secondary | ICD-10-CM | POA: Diagnosis present

## 2020-07-27 DIAGNOSIS — Z8619 Personal history of other infectious and parasitic diseases: Secondary | ICD-10-CM | POA: Diagnosis not present

## 2020-07-27 DIAGNOSIS — F0390 Unspecified dementia without behavioral disturbance: Secondary | ICD-10-CM | POA: Diagnosis present

## 2020-07-27 DIAGNOSIS — E872 Acidosis: Secondary | ICD-10-CM | POA: Diagnosis present

## 2020-07-27 DIAGNOSIS — E876 Hypokalemia: Secondary | ICD-10-CM | POA: Diagnosis present

## 2020-07-27 DIAGNOSIS — K7030 Alcoholic cirrhosis of liver without ascites: Secondary | ICD-10-CM | POA: Diagnosis present

## 2020-07-27 DIAGNOSIS — J449 Chronic obstructive pulmonary disease, unspecified: Secondary | ICD-10-CM | POA: Diagnosis present

## 2020-07-27 DIAGNOSIS — E860 Dehydration: Secondary | ICD-10-CM | POA: Diagnosis present

## 2020-07-27 DIAGNOSIS — K7200 Acute and subacute hepatic failure without coma: Secondary | ICD-10-CM | POA: Diagnosis not present

## 2020-07-27 DIAGNOSIS — R410 Disorientation, unspecified: Secondary | ICD-10-CM | POA: Diagnosis not present

## 2020-07-27 DIAGNOSIS — I10 Essential (primary) hypertension: Secondary | ICD-10-CM | POA: Diagnosis present

## 2020-07-27 DIAGNOSIS — I69320 Aphasia following cerebral infarction: Secondary | ICD-10-CM | POA: Diagnosis not present

## 2020-07-27 DIAGNOSIS — R1013 Epigastric pain: Secondary | ICD-10-CM | POA: Diagnosis not present

## 2020-07-27 DIAGNOSIS — Z7982 Long term (current) use of aspirin: Secondary | ICD-10-CM

## 2020-07-27 DIAGNOSIS — I69322 Dysarthria following cerebral infarction: Secondary | ICD-10-CM

## 2020-07-27 DIAGNOSIS — Z20822 Contact with and (suspected) exposure to covid-19: Secondary | ICD-10-CM | POA: Diagnosis present

## 2020-07-27 DIAGNOSIS — K219 Gastro-esophageal reflux disease without esophagitis: Secondary | ICD-10-CM | POA: Diagnosis present

## 2020-07-27 DIAGNOSIS — Z8719 Personal history of other diseases of the digestive system: Secondary | ICD-10-CM

## 2020-07-27 DIAGNOSIS — R441 Visual hallucinations: Secondary | ICD-10-CM | POA: Diagnosis present

## 2020-07-27 DIAGNOSIS — F17210 Nicotine dependence, cigarettes, uncomplicated: Secondary | ICD-10-CM | POA: Diagnosis present

## 2020-07-27 DIAGNOSIS — R509 Fever, unspecified: Secondary | ICD-10-CM | POA: Diagnosis present

## 2020-07-27 DIAGNOSIS — K7682 Hepatic encephalopathy: Secondary | ICD-10-CM | POA: Diagnosis present

## 2020-07-27 DIAGNOSIS — K7290 Hepatic failure, unspecified without coma: Secondary | ICD-10-CM | POA: Diagnosis present

## 2020-07-27 DIAGNOSIS — Z720 Tobacco use: Secondary | ICD-10-CM | POA: Diagnosis not present

## 2020-07-27 DIAGNOSIS — R197 Diarrhea, unspecified: Secondary | ICD-10-CM | POA: Diagnosis present

## 2020-07-27 DIAGNOSIS — E785 Hyperlipidemia, unspecified: Secondary | ICD-10-CM | POA: Diagnosis present

## 2020-07-27 LAB — RAPID URINE DRUG SCREEN, HOSP PERFORMED
Amphetamines: NOT DETECTED
Barbiturates: NOT DETECTED
Benzodiazepines: NOT DETECTED
Cocaine: NOT DETECTED
Opiates: NOT DETECTED
Tetrahydrocannabinol: NOT DETECTED

## 2020-07-27 LAB — COMPREHENSIVE METABOLIC PANEL
ALT: 28 U/L (ref 0–44)
AST: 20 U/L (ref 15–41)
Albumin: 4 g/dL (ref 3.5–5.0)
Alkaline Phosphatase: 92 U/L (ref 38–126)
Anion gap: 13 (ref 5–15)
BUN: 12 mg/dL (ref 8–23)
CO2: 24 mmol/L (ref 22–32)
Calcium: 9.1 mg/dL (ref 8.9–10.3)
Chloride: 107 mmol/L (ref 98–111)
Creatinine, Ser: 1.07 mg/dL (ref 0.61–1.24)
GFR, Estimated: >60 mL/min (ref >60)
Glucose, Bld: 166 mg/dL — ABNORMAL HIGH (ref 70–99)
Potassium: 3.4 mmol/L — ABNORMAL LOW (ref 3.5–5.1)
Sodium: 144 mmol/L (ref 135–145)
Total Bilirubin: 1.5 mg/dL — ABNORMAL HIGH (ref 0.3–1.2)
Total Protein: 6.5 g/dL (ref 6.5–8.1)

## 2020-07-27 LAB — I-STAT VENOUS BLOOD GAS, ED
Acid-Base Excess: 3 mmol/L — ABNORMAL HIGH (ref 0.0–2.0)
Bicarbonate: 25.7 mmol/L (ref 20.0–28.0)
Calcium, Ion: 1.06 mmol/L — ABNORMAL LOW (ref 1.15–1.40)
HCT: 41 % (ref 39.0–52.0)
Hemoglobin: 13.9 g/dL (ref 13.0–17.0)
O2 Saturation: 77 %
Potassium: 3.7 mmol/L (ref 3.5–5.1)
Sodium: 143 mmol/L (ref 135–145)
TCO2: 27 mmol/L (ref 22–32)
pCO2, Ven: 31.7 mmHg — ABNORMAL LOW (ref 44.0–60.0)
pH, Ven: 7.517 — ABNORMAL HIGH (ref 7.250–7.430)
pO2, Ven: 36 mmHg (ref 32.0–45.0)

## 2020-07-27 LAB — CBC WITH DIFFERENTIAL/PLATELET
Abs Immature Granulocytes: 0.03 10*3/uL (ref 0.00–0.07)
Basophils Absolute: 0 10*3/uL (ref 0.0–0.1)
Basophils Relative: 0 %
Eosinophils Absolute: 0 10*3/uL (ref 0.0–0.5)
Eosinophils Relative: 0 %
HCT: 44.7 % (ref 39.0–52.0)
Hemoglobin: 14.6 g/dL (ref 13.0–17.0)
Immature Granulocytes: 0 %
Lymphocytes Relative: 7 %
Lymphs Abs: 0.6 10*3/uL — ABNORMAL LOW (ref 0.7–4.0)
MCH: 29.4 pg (ref 26.0–34.0)
MCHC: 32.7 g/dL (ref 30.0–36.0)
MCV: 90.1 fL (ref 80.0–100.0)
Monocytes Absolute: 0.5 10*3/uL (ref 0.1–1.0)
Monocytes Relative: 6 %
Neutro Abs: 7.1 10*3/uL (ref 1.7–7.7)
Neutrophils Relative %: 87 %
Platelets: 103 10*3/uL — ABNORMAL LOW (ref 150–400)
RBC: 4.96 MIL/uL (ref 4.22–5.81)
RDW: 14.1 % (ref 11.5–15.5)
WBC: 8.2 10*3/uL (ref 4.0–10.5)
nRBC: 0 % (ref 0.0–0.2)

## 2020-07-27 LAB — URINALYSIS, ROUTINE W REFLEX MICROSCOPIC
Bacteria, UA: NONE SEEN
Bilirubin Urine: NEGATIVE
Glucose, UA: NEGATIVE mg/dL
Ketones, ur: 5 mg/dL — AB
Leukocytes,Ua: NEGATIVE
Nitrite: NEGATIVE
Protein, ur: NEGATIVE mg/dL
Specific Gravity, Urine: 1.02 (ref 1.005–1.030)
pH: 5 (ref 5.0–8.0)

## 2020-07-27 LAB — CREATININE, SERUM
Creatinine, Ser: 1.02 mg/dL (ref 0.61–1.24)
GFR, Estimated: >60 mL/min (ref >60)

## 2020-07-27 LAB — TSH: TSH: 0.488 u[IU]/mL (ref 0.350–4.500)

## 2020-07-27 LAB — RESP PANEL BY RT PCR (RSV, FLU A&B, COVID)
Influenza A by PCR: NEGATIVE
Influenza B by PCR: NEGATIVE
Respiratory Syncytial Virus by PCR: NEGATIVE
SARS Coronavirus 2 by RT PCR: NEGATIVE

## 2020-07-27 LAB — TROPONIN I (HIGH SENSITIVITY)
Troponin I (High Sensitivity): 18 ng/L — ABNORMAL HIGH (ref <18)
Troponin I (High Sensitivity): 18 ng/L — ABNORMAL HIGH (ref <18)

## 2020-07-27 LAB — CK: Total CK: 75 U/L (ref 49–397)

## 2020-07-27 LAB — AMMONIA: Ammonia: 27 umol/L (ref 9–35)

## 2020-07-27 LAB — HIV ANTIBODY (ROUTINE TESTING W REFLEX): HIV Screen 4th Generation wRfx: NONREACTIVE

## 2020-07-27 LAB — ETHANOL: Alcohol, Ethyl (B): <10 mg/dL (ref <10)

## 2020-07-27 LAB — LACTIC ACID, PLASMA: Lactic Acid, Venous: 2 mmol/L (ref 0.5–1.9)

## 2020-07-27 MED ORDER — LORAZEPAM 2 MG/ML IJ SOLN
1.0000 mg | Freq: Once | INTRAMUSCULAR | Status: AC
  Administered 2020-07-27: 1 mg via INTRAVENOUS
  Filled 2020-07-27: qty 1

## 2020-07-27 MED ORDER — ENOXAPARIN SODIUM 40 MG/0.4ML ~~LOC~~ SOLN
40.0000 mg | SUBCUTANEOUS | Status: DC
  Administered 2020-07-27 – 2020-07-30 (×4): 40 mg via SUBCUTANEOUS
  Filled 2020-07-27 (×4): qty 0.4

## 2020-07-27 MED ORDER — LORAZEPAM 2 MG/ML IJ SOLN
INTRAMUSCULAR | Status: AC
  Administered 2020-07-27: 1 mg
  Filled 2020-07-27: qty 1

## 2020-07-27 MED ORDER — HALOPERIDOL LACTATE 5 MG/ML IJ SOLN
5.0000 mg | Freq: Once | INTRAMUSCULAR | Status: AC
  Administered 2020-07-27: 5 mg via INTRAVENOUS
  Filled 2020-07-27: qty 1

## 2020-07-27 MED ORDER — LORAZEPAM 2 MG/ML IJ SOLN
0.5000 mg | Freq: Once | INTRAMUSCULAR | Status: AC
  Administered 2020-07-27: 1 mg via INTRAVENOUS
  Filled 2020-07-27: qty 1

## 2020-07-27 NOTE — Hospital Course (Addendum)
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 Elected to forgo surgery at that time. [facility] so unclear what follow up he had.  COPD  DM  HTN  Pulm lesions  Acute encephalopathy. Typically has expressive dysphasia from prior CVA but is otherwise functioning independently. Daughter notes similar presentation with is prior CVA.  DDX -unlikely metabolic given relatively normal labs -unlikely infection -UDS, ETOH neg -TSH normal -possible hepatic encephalopathy in setting of history of cirrhosis. Ammonia normal --possible seizure -possible CVA vs PRES Plan --EEG --brain MRI   Fever. No white count. CXR/UA normal. No abd tenderness on exam.  DDX -infection -drug: not on any specific drugs to cause fever. Metformin may causse lactate -seizure Plan -blood cultures  -no procalc due to liver disease -  Primary resp alkalosis

## 2020-07-27 NOTE — ED Provider Notes (Signed)
[facility] EMERGENCY DEPARTMENT Provider Note   CSN: [id number] Arrival date & time: 07/27/20  1057     History No chief complaint on file.   Matthew Roman is a 64 y.o. male.  The history is provided by the EMS personnel and a relative.  Altered Mental Status Presenting symptoms: behavior changes and partial responsiveness   Severity:  Severe Most recent episode:  Today Episode history:  Single (pt found in yard covered in stool by wife) Duration: last known normal 2 pm yesterday. Timing:  Constant Progression:  Unchanged Chronicity:  New      Past Medical History:  Diagnosis Date  . Cirrhosis ([place])   . COPD (chronic obstructive pulmonary disease) ([place])   . Diabetes mellitus without complication ([place])   . Hepatitis   . Hypertension   . Stroke [facility])     Patient Active Problem List   Diagnosis Date Noted  . Acute encephalopathy 09/02/2018  . Multiple lacunar infarcts ([place]) 09/02/2018  . Fracture of femoral neck, right, closed ([place]) 09/02/2018  . TOBACCO DEPENDENCE 11/19/2006  . BACK PAIN, LOW 11/19/2006    Past Surgical History:  Procedure Laterality Date  . BACK SURGERY    . HIP PINNING,CANNULATED Right 09/04/2018   Procedure: CANNULATED HIP PINNING;  Surgeon: Marchia Bond, MD;  Location: [facility];  Service: Orthopedics;  Laterality: Right;  . KNEE SURGERY    . SHOULDER SURGERY         Family History  Problem Relation Age of Onset  . Stroke Mother   . Osteoporosis Neg Hx     Social History   Tobacco Use  . Smoking status: Current Every Day Smoker    Packs/day: 0.50  . Smokeless tobacco: Never Used  Vaping Use  . Vaping Use: Never used  Substance Use Topics  . Alcohol use: Never  . Drug use: Never    Home Medications Prior to Admission medications   Medication Sig Start Date End Date Taking? Authorizing Provider  albuterol (PROAIR HFA) 108 (90 Base) MCG/ACT inhaler Inhale into the lungs every 6 (six) hours as needed for  wheezing or shortness of breath.   Yes [provider]  amLODipine (NORVASC) 5 MG tablet Take 5 mg by mouth daily.    Yes [provider]  aspirin 81 MG chewable tablet Chew 81 mg by mouth daily.   Yes [provider]  atorvastatin (LIPITOR) 20 MG tablet Take 1 tablet (20 mg total) by mouth daily at 6 PM. Patient taking differently: Take 10 mg by mouth daily at 6 PM.  01/27/19  Yes Garvin Fila, MD  Cholecalciferol (VITAMIN D) 50 MCG (2000 UT) CAPS Take 2,000 Units by mouth daily.   Yes [provider]  cloNIDine (CATAPRES) 0.1 MG tablet Take 0.1 mg by mouth as needed. As needed is systolic greater than 540   Yes [provider]  metFORMIN (GLUCOPHAGE-XR) 500 MG 24 hr tablet Take 500 mg by mouth 2 (two) times daily with a meal.   Yes [provider]  omeprazole (PRILOSEC) 20 MG capsule Take 20 mg by mouth daily.   Yes [provider]  oxyCODONE (ROXICODONE) 15 MG immediate release tablet Take 1 tablet (15 mg total) by mouth 3 (three) times daily. Patient taking differently: Take 15 mg by mouth every 8 (eight) hours as needed for pain.  09/05/18  Yes Marchia Bond, MD    Allergies    Patient has no known allergies.  Review of Systems  Review of Systems  Unable to perform ROS: Patient unresponsive    Physical Exam Updated Vital Signs BP (!) 156/88   Pulse 78   Temp (!) 101.5 F (38.6 C) (Rectal)   Resp (!) 29   SpO2 95%   Physical Exam Vitals reviewed.  Constitutional:      Appearance: He is normal weight. He is not toxic-appearing or diaphoretic.  HENT:     Head: Normocephalic and atraumatic.     Nose: Nose normal.     Mouth/Throat:     Mouth: Mucous membranes are dry.  Eyes:     Conjunctiva/sclera: Conjunctivae normal.     Comments: Pupils unequal but reactive, R 4 mm, L 6 mm  Cardiovascular:     Rate and Rhythm: Normal rate.  Pulmonary:     Effort: Pulmonary effort is normal.     Breath sounds: Normal  breath sounds.  Abdominal:     General: Abdomen is flat.     Palpations: Abdomen is soft.     Tenderness: There is no abdominal tenderness.  Musculoskeletal:     Right lower leg: No edema.     Left lower leg: No edema.  Skin:    General: Skin is warm and dry.  Psychiatric:        Mood and Affect: Mood normal.        Behavior: Behavior normal.     ED Results / Procedures / Treatments   Labs (all labs ordered are listed, but only abnormal results are displayed) Labs Reviewed  COMPREHENSIVE METABOLIC PANEL - Abnormal; Notable for the following components:      Result Value   Potassium 3.4 (*)    Glucose, Bld 166 (*)    Total Bilirubin 1.5 (*)    All other components within normal limits  LACTIC ACID, PLASMA - Abnormal; Notable for the following components:   Lactic Acid, Venous 2.0 (*)    All other components within normal limits  CBC WITH DIFFERENTIAL/PLATELET - Abnormal; Notable for the following components:   Platelets 103 (*)    Lymphs Abs 0.6 (*)    All other components within normal limits  URINALYSIS, ROUTINE W REFLEX MICROSCOPIC - Abnormal; Notable for the following components:   Hgb urine dipstick SMALL (*)    Ketones, ur 5 (*)    All other components within normal limits  I-STAT VENOUS BLOOD GAS, ED - Abnormal; Notable for the following components:   pH, Ven 7.517 (*)    pCO2, Ven 31.7 (*)    Acid-Base Excess 3.0 (*)    Calcium, Ion 1.06 (*)    All other components within normal limits  TROPONIN I (HIGH SENSITIVITY) - Abnormal; Notable for the following components:   Troponin I (High Sensitivity) 18 (*)    All other components within normal limits  TROPONIN I (HIGH SENSITIVITY) - Abnormal; Notable for the following components:   Troponin I (High Sensitivity) 18 (*)    All other components within normal limits  RESP PANEL BY RT PCR (RSV, FLU A&B, COVID)  AMMONIA  TSH  ETHANOL  RAPID URINE DRUG SCREEN, HOSP PERFORMED  BLOOD GAS, VENOUS    EKG EKG  Interpretation  Date/Time:  Friday July 27 2020 11:31:46 EDT Ventricular Rate:  65 PR Interval:    QRS Duration: 84 QT Interval:  355 QTC Calculation: 369 R Axis:   48 Text Interpretation: Sinus rhythm Anteroseptal infarct, age indeterminate Baseline wander in lead(s) V4 Artifact Abnormal ECG Confirmed by Carmin Muskrat [phone]) on  07/27/2020 12:02:13 PM   Radiology CT Head Wo Contrast  Result Date: 07/27/2020 CLINICAL DATA:  Disoriented.  The no reported injuries. EXAM: CT HEAD WITHOUT CONTRAST TECHNIQUE: Contiguous axial images were obtained from the base of the skull through the vertex without intravenous contrast. COMPARISON:  Prior studies from November of 2020 and December of 2019 FINDINGS: Brain: Signs of remote infarcts in the LEFT basal ganglia and RIGHT thalamus and basal ganglia. Evidence of chronic microvascular ischemic change as before. No hydrocephalus, midline shift or intracranial hemorrhage. No extra-axial fluid collection. Vascular: No hyperdense vessel or unexpected calcification. Skull: Normal. Negative for fracture or focal lesion. Sinuses/Orbits: No acute finding. Other: None IMPRESSION: 1. No acute intracranial pathology. 2. Signs of prior infarct and chronic microvascular ischemic change as before. Electronically Signed   By: Zetta Bills M.D.   On: 07/27/2020 12:08   DG Chest Portable 1 View  Result Date: 07/27/2020 CLINICAL DATA:  Altered mental status. EXAM: PORTABLE CHEST 1 VIEW COMPARISON:  09/02/2018 FINDINGS: The heart size and mediastinal contours are within normal limits. Calcific atherosclerosis of the aorta. Both lungs are clear. Right costophrenic sulcus is not imaged. No visible pleural effusions or pneumothorax. The visualized skeletal structures are unremarkable. IMPRESSION: No acute cardiopulmonary disease. Electronically Signed   By: Margaretha Sheffield MD   On: 07/27/2020 11:36    Procedures Procedures (including critical care time)  Medications  Ordered in ED Medications  LORazepam (ATIVAN) 2 MG/ML injection (1 mg  Given 07/27/20 1213)  LORazepam (ATIVAN) 2 MG/ML injection (1 mg  Given 07/27/20 1518)  haloperidol lactate (HALDOL) injection 5 mg (5 mg Intravenous Given 07/27/20 1508)    ED Course  I have reviewed the triage vital signs and the nursing notes.  Pertinent labs & imaging results that were available during my care of the patient were reviewed by me and considered in my medical decision making (see chart for details).    MDM Rules/Calculators/A&P                          Medical Decision Making: Denarius Sesler is a 64 y.o. male who presented to the ED today with AMS. Last known normal 2 pm the day prior, found in yard covered in feces by wife, pt confused and unable to speak in full sentences. Pt has history of stroke with some word finding difficulty but is baseline ambulatory, A&Ox4. No reported recent illnesses or falls. No external evidence of trauma today, pt ambulatory, moving all extremities with normal strength, no facial asymmetry. Pt able to say "ok", no other responses.   Past medical history significant for DM, COPD, HTN, Hepatitis, stroke  Reviewed and confirmed nursing documentation for past medical history, family history, social history.  Unclear etiology of pt AMS. DDx infectious vs toxicology vs stroke vs endocrine  Febrile to 101.5 F rectally. No tachycardia, no hypotension. Lungs CTAB, Abdomen soft non tender, non distended. No neck rigidity, no obvious skin infection.  CXR and UA without evidence of infection. No leukocytosis. Lactate 2.0. Soft non tender abdomen.  UDS negative.  EKG without acute ischemic changes, troponin 18 x2.  CT head without evidence of acute abnormality.  TSH and ammonia normal.  Upon reassessing patient, patient was agitated and trying to leave room,unable to re-direct and talk pt into staying in bed, safely placed in bed and given ativan 1 mg.   Consults: none  performed  All radiology and laboratory studies reviewed independently and with  my attending physician, agree with reading provided by radiologist unless otherwise noted.   Notified by nursing pt trying elope room and has dislodged lines and monitors, given 1 mg ativan without response, given 5 Haldol.  Pt granddaughter at bedside. Reports pt baseline normal communication, functional and ambulatory. She reports some issues with word finding and repeating himself but nothing similar to today. She denies pt has hx of alcohol or drug use that she is aware of.   Pt is persistently altered, unclear etiology at this time. Pt will need further evaluation inpatient.   Based on the above findings, I believe patient requires admission.   The above care was discussed with and agreed upon by my attending physician. Emergency Department Medication Summary:  Medications  LORazepam (ATIVAN) 2 MG/ML injection (1 mg  Given 07/27/20 1213)  LORazepam (ATIVAN) 2 MG/ML injection (1 mg  Given 07/27/20 1518)  haloperidol lactate (HALDOL) injection 5 mg (5 mg Intravenous Given 07/27/20 1508)       Final Clinical Impression(s) / ED Diagnoses Final diagnoses:  Delirium, acute    Rx / DC Orders ED Discharge Orders    None       Roosevelt Locks, MD 07/27/20 1617    Carmin Muskrat, MD 07/29/20 1719

## 2020-07-27 NOTE — ED Triage Notes (Signed)
Pt BIB for AMS from [facility]. LSW 1400 yesterday, normally ambulatory AOx4. VSS for EMS, CBG 211. Alert but not following any commands, no verbal response, pupils reactive but unequal

## 2020-07-27 NOTE — ED Notes (Signed)
Per Dr Regenia Skeeter q2 hour neuro checks not indicated at this time

## 2020-07-27 NOTE — ED Notes (Signed)
Pt transported to MRI 

## 2020-07-27 NOTE — H&P (Signed)
Date: 07/27/2020               Patient Name:  Matthew Roman MRN: [id number]  DOB: 1956-04-19 Age / Sex: 64 y.o., male   PCP: Bonnita Nasuti, MD         Medical Service: Internal Medicine Teaching Service         Attending Physician: Dr. Sid Falcon, MD    First Contact: Dr. Foy Gu[place], MD Pager: [phone]  Second Contact: Dr. Mitzi Hansen, MD Pager: [phone]       After Hours (After 5p/  First Contact Pager: [phone]  weekends / holidays): Second Contact Pager: [phone]   Chief Complaint: Altered mental status  History of Present Illness:  Matthew Roman is a 64 year old man with past medical history of HTN, HLD, T2DM, COPD, current tobacco use, cirrhosis, prior CVA (multiple right MCA/ACA infarcts in December of 2019 with residual deficits of mild confusion, mixed dysarthria and expressive aphasia), and colorectal carcinoma (dx in 2018, but no treatment) who presented to [facility] on 07/27/2020 for evaluation of altered mental status.  Due to patient's mental status, history obtained from family, ED provider, and medical record review. According to patient's daughter, he was last seen in his normal state of health at approximately 2PM yesterday (07/26/2020). She went to work, and upon coming home last night she assumed that the patient was in his room as his door was closed with the lights off, however she did not see him. Upon her waking up this morning, patient was noted to be sitting outside on the porch in the cold with only his pajama pants on, smoking cigarettes and letting the outdoor pets into the house. He was unable to talk to his daughter or daughter's husband all morning and frequently grunted. He went into his room and had a bowel movement on his floor and knocked over furniture. Daughter reports similar behavior in 2019 during his prior stroke. His daughter called an ambulance to pick him up.  Of note, patient reportedly has been in his normal state of health over  the past several months with no new symptoms. She denies recent fevers, chills, abdominal pain, nausea, vomiting, diarrhea, or recent sick contacts. He has reportedly been following closely with the [facility] with a most recent appointment approximately one month prior. She is not aware of any recent diagnoses or medication changes as patient handles his own medications. He was scheduled for a colonoscopy later today to further evaluate a polyp suspected of being cancerous initially found in 2018.  ED Course: On arrival to ED, patient was found to be febrile (101.5), mildly hypertensive (138/100) and tachypneic (26) but otherwise saturating well on room air. Initial labs revealed lactic acidemia of 2.0; troponin 18 x2; VBG pH 7.517, pCO2 31.7, HCO3 25.7; CMP revealed Tbili 1.5 and K of 3.4; otherwise remaining labs (CBC with diff, TSH, Ethanol, ammonia, COVID, UA and UDS) were unremarkable. CXR showed no acute findings while CTH showed prior infarct with chronic microvascular ischemic changes. He received 1mg  ativan x 2 and 5mg  haldol. He was admitted to IMTS for further evaluation and management of his condition.  Meds: Current Meds  Medication Sig  . albuterol (PROAIR HFA) 108 (90 Base) MCG/ACT inhaler Inhale into the lungs every 6 (six) hours as needed for wheezing or shortness of breath.  Marland Kitchen amLODipine (NORVASC) 5 MG tablet Take 5 mg by mouth daily.   Marland Kitchen aspirin 81 MG chewable tablet Chew 81 mg by  mouth daily.  Marland Kitchen atorvastatin (LIPITOR) 20 MG tablet Take 1 tablet (20 mg total) by mouth daily at 6 PM. (Patient taking differently: Take 10 mg by mouth daily at 6 PM. )  . Cholecalciferol (VITAMIN D) 50 MCG (2000 UT) CAPS Take 2,000 Units by mouth daily.  . cloNIDine (CATAPRES) 0.1 MG tablet Take 0.1 mg by mouth as needed. As needed is systolic greater than 573  . metFORMIN (GLUCOPHAGE-XR) 500 MG 24 hr tablet Take 500 mg by mouth 2 (two) times daily with a meal.  . omeprazole (PRILOSEC) 20 MG capsule Take 20 mg  by mouth daily.  Marland Kitchen oxyCODONE (ROXICODONE) 15 MG immediate release tablet Take 1 tablet (15 mg total) by mouth 3 (three) times daily. (Patient taking differently: Take 15 mg by mouth every 8 (eight) hours as needed for pain. )   Allergies: Allergies as of 07/27/2020  . (No Known Allergies)   Past Medical History:  Diagnosis Date  . Cirrhosis ([place])   . COPD (chronic obstructive pulmonary disease) ([place])   . Diabetes mellitus without complication ([place])   . Hepatitis   . Hypertension   . Stroke [facility])    Family History:  Family History  Problem Relation Age of Onset  . Stroke Mother   . Osteoporosis Neg Hx    Social History:  Lives with daughter and daughter's husband. Smokes about 1 pack per day. Denies alcohol or recreational drug use. Does not drive, but functions otherwise independently.   Review of Systems: A complete ROS was negative except as per HPI.   Physical Exam: Blood pressure 139/80, pulse 79, temperature (!) 101.5 F (38.6 C), temperature source Rectal, resp. rate (!) 26, SpO2 96 %. Physical Exam Vitals and nursing note reviewed. Exam conducted with a chaperone present.  Constitutional:      Comments: Patient lying in bed in no acute distress, groaning quietly with eyes closed.  HENT:     Head: Normocephalic and atraumatic.     Mouth/Throat:     Mouth: Mucous membranes are dry.     Pharynx: Oropharynx is clear.  Eyes:     Extraocular Movements: Extraocular movements intact.     Conjunctiva/sclera: Conjunctivae normal.  Cardiovascular:     Rate and Rhythm: Normal rate and regular rhythm.     Pulses: Normal pulses.     Heart sounds: Normal heart sounds. No murmur heard.   Pulmonary:     Effort: Pulmonary effort is normal. No respiratory distress.     Breath sounds: Normal breath sounds.  Abdominal:     General: Abdomen is flat. Bowel sounds are normal. There is no distension.     Palpations: Abdomen is soft.     Tenderness: There is no abdominal  tenderness.     Hernia: No hernia is present.  Musculoskeletal:        General: No swelling, tenderness, deformity or signs of injury. Normal range of motion.     Cervical back: Normal range of motion and neck supple.     Right lower leg: No edema.     Left lower leg: No edema.  Skin:    General: Skin is warm and dry.     Capillary Refill: Capillary refill takes less than 2 seconds.     Findings: No erythema or rash.  Neurological:     Mental Status: He is disoriented.     Comments: Able to squeeze hands when prompted    EKG: personally reviewed my interpretation is normal sinus rhythm.  CXR: personally reviewed my interpretation is no acute cardiopulmonary abnormality.  IMAGING:  CT Head Wo Contrast  Result Date: 07/27/2020 CLINICAL DATA:  Disoriented.  The no reported injuries. EXAM: CT HEAD WITHOUT CONTRAST TECHNIQUE: Contiguous axial images were obtained from the base of the skull through the vertex without intravenous contrast. COMPARISON:  Prior studies from November of 2020 and December of 2019 FINDINGS: Brain: Signs of remote infarcts in the LEFT basal ganglia and RIGHT thalamus and basal ganglia. Evidence of chronic microvascular ischemic change as before. No hydrocephalus, midline shift or intracranial hemorrhage. No extra-axial fluid collection. Vascular: No hyperdense vessel or unexpected calcification. Skull: Normal. Negative for fracture or focal lesion. Sinuses/Orbits: No acute finding. Other: None IMPRESSION: 1. No acute intracranial pathology. 2. Signs of prior infarct and chronic microvascular ischemic change as before. Electronically Signed   By: Zetta Bills M.D.   On: 07/27/2020 12:08   DG Chest Portable 1 View  Result Date: 07/27/2020 CLINICAL DATA:  Altered mental status. EXAM: PORTABLE CHEST 1 VIEW COMPARISON:  09/02/2018 FINDINGS: The heart size and mediastinal contours are within normal limits. Calcific atherosclerosis of the aorta. Both lungs are clear. Right  costophrenic sulcus is not imaged. No visible pleural effusions or pneumothorax. The visualized skeletal structures are unremarkable. IMPRESSION: No acute cardiopulmonary disease. Electronically Signed   By: Margaretha Sheffield MD   On: 07/27/2020 11:36   Assessment & Plan by Problem: Active Problems:   Acute encephalopathy  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  #Acute encephalopathy, active #Fever, active Patient presenting with 12-24 hour history of altered mental status with abnormal behavior. Patient reportedly was in his normal state of health prior to onset this morning with no new symptoms preceding the episode, changes to medications, substance use, or trauma. According to family, patient's behavior similar to prior episode in December 2019 when he was found to have multiple right MCA/ACA infarcts, raising the suspicion of another CVA. Although initial CT was negative for an acute finding, he requires further imaging to evaluate for this etiology. Additionally, although patient does not have a prior history of seizure, his current mental state may represent a post-ictal period, however this period appears to be extended. We will obtain EEG. Patient's presentation may be secondary to meningitis/encephalitis given his current fever, however he has no nuchal rigidity. Fortunately, further imaging will help provide further insight into this cause. Alternative causes for his presentation include ingestion of an unknown substance, however toxicology screen thus far has been unremarkable, or an underlying infection especially in the setting of his  fever. However, patient does not have a leukocytosis and his workup and examination thus far has been otherwise unremarkable. We will obtain blood cultures and hold off on antibiotics at this time. -MRI brain with and without  -1mg  IV ativan prior to scan -Blood cultures x2 -EEG -CK now -BMP tomorrow morning -CBC tomorrow morning -Seizure precautions -Telemetry  #History of cirrhosis, chronic #Prior, treated, chronic hepatitis C infection #Prior history of alcohol abuse Patient with history of cirrhosis secondary to chronic hepatitis C infection and prior heavy alcohol use.  -RUQ ultrasound -BMP tomorrow morning  #Code status: Full code, per patient's daughter  and prior #Diet: NPO #IVF: None #VTE ppx: lovenox  Dispo: Admit patient to Inpatient with expected length of stay greater than 2 midnights.  Signed: Cato Mulligan, MD 07/27/2020, 5:02 PM  Pager: [phone] After 5pm on weekdays and 1pm on weekends: On Call pager: [phone]

## 2020-07-28 DIAGNOSIS — R197 Diarrhea, unspecified: Secondary | ICD-10-CM

## 2020-07-28 DIAGNOSIS — Z7984 Long term (current) use of oral hypoglycemic drugs: Secondary | ICD-10-CM

## 2020-07-28 DIAGNOSIS — I10 Essential (primary) hypertension: Secondary | ICD-10-CM

## 2020-07-28 DIAGNOSIS — E119 Type 2 diabetes mellitus without complications: Secondary | ICD-10-CM

## 2020-07-28 DIAGNOSIS — E876 Hypokalemia: Secondary | ICD-10-CM

## 2020-07-28 DIAGNOSIS — K7200 Acute and subacute hepatic failure without coma: Secondary | ICD-10-CM

## 2020-07-28 DIAGNOSIS — R509 Fever, unspecified: Secondary | ICD-10-CM

## 2020-07-28 DIAGNOSIS — Z8719 Personal history of other diseases of the digestive system: Secondary | ICD-10-CM

## 2020-07-28 DIAGNOSIS — K7460 Unspecified cirrhosis of liver: Secondary | ICD-10-CM

## 2020-07-28 LAB — BASIC METABOLIC PANEL
Anion gap: 11 (ref 5–15)
BUN: 14 mg/dL (ref 8–23)
CO2: 24 mmol/L (ref 22–32)
Calcium: 8.9 mg/dL (ref 8.9–10.3)
Chloride: 108 mmol/L (ref 98–111)
Creatinine, Ser: 0.88 mg/dL (ref 0.61–1.24)
GFR, Estimated: >60 mL/min (ref >60)
Glucose, Bld: 118 mg/dL — ABNORMAL HIGH (ref 70–99)
Potassium: 3.1 mmol/L — ABNORMAL LOW (ref 3.5–5.1)
Sodium: 143 mmol/L (ref 135–145)

## 2020-07-28 LAB — CBC
HCT: 41 % (ref 39.0–52.0)
Hemoglobin: 13.5 g/dL (ref 13.0–17.0)
MCH: 29.3 pg (ref 26.0–34.0)
MCHC: 32.9 g/dL (ref 30.0–36.0)
MCV: 89.1 fL (ref 80.0–100.0)
Platelets: 86 10*3/uL — ABNORMAL LOW (ref 150–400)
RBC: 4.6 MIL/uL (ref 4.22–5.81)
RDW: 14 % (ref 11.5–15.5)
WBC: 6.9 10*3/uL (ref 4.0–10.5)
nRBC: 0 % (ref 0.0–0.2)

## 2020-07-28 LAB — MAGNESIUM: Magnesium: 1.9 mg/dL (ref 1.7–2.4)

## 2020-07-28 MED ORDER — AMLODIPINE BESYLATE 5 MG PO TABS
5.0000 mg | ORAL_TABLET | Freq: Every day | ORAL | Status: DC
  Administered 2020-07-28: 5 mg via ORAL
  Filled 2020-07-28: qty 1

## 2020-07-28 MED ORDER — LACTULOSE 10 GM/15ML PO SOLN
10.0000 g | Freq: Two times a day (BID) | ORAL | Status: DC
  Administered 2020-07-28 (×2): 10 g via ORAL
  Filled 2020-07-28: qty 30

## 2020-07-28 MED ORDER — ATORVASTATIN CALCIUM 10 MG PO TABS
20.0000 mg | ORAL_TABLET | Freq: Every day | ORAL | Status: DC
  Administered 2020-07-28 – 2020-07-30 (×3): 20 mg via ORAL
  Filled 2020-07-28 (×3): qty 2

## 2020-07-28 MED ORDER — POTASSIUM CHLORIDE 10 MEQ/100ML IV SOLN
10.0000 meq | INTRAVENOUS | Status: AC
  Administered 2020-07-28 (×6): 10 meq via INTRAVENOUS
  Filled 2020-07-28 (×6): qty 100

## 2020-07-28 MED ORDER — PANTOPRAZOLE SODIUM 40 MG PO TBEC
40.0000 mg | DELAYED_RELEASE_TABLET | Freq: Every day | ORAL | Status: DC
  Administered 2020-07-28 – 2020-07-31 (×4): 40 mg via ORAL
  Filled 2020-07-28 (×4): qty 1

## 2020-07-28 NOTE — Progress Notes (Addendum)
  Date: 07/28/2020  Patient name: Matthew Roman  Medical record number: [id number]  Date of birth: 1956-03-19   I have seen and evaluated Matthew Roman and discussed their care with the Residency Team. Briefly, Matthew Roman is a 64 year old man who presented with acute encephalopathy.  He was much improved this morning, able to converse with [place].  He does not remember much of what brought Matthew Roman in, but he notes that he has not been feeling well for the past 3 weeks.  He notes diarrhea, having to rush to the bathroom, 3X per day bowel movements and brown stool (no change in color).  He was otherwise feeling his normal self prior to this.  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  He had a fever in the ED, but no obvious source of infection so antibiotics were held.  He did received lactulose which could explain his recovery.   Vitals:   07/28/20 0337 07/28/20 0738  BP: (!) 142/77 (!) 157/87  Pulse: 75 79  Resp: 18 20  Temp: 98.1 F (36.7 C) 97.9 F (36.6 C)  SpO2: 97% 100%   General: Awake, alert, oriented to person, place Eyes: Anicteric sclerae, no injection HENT: Neck is supple, Dry MM with whitish plaque on tongue CV: RR, NR, no murmur, no pedal edema Pulm: Breathing comfortably, no wheezing Abd: Soft, +BS, NT, ND.  He has a scar at the umbilicus from previous gall bladder surgery MSK: Low bulk to muscles, normal tone, no contractures Skin: He has spider angiomas on chest, palmar erythema Neuro: + asterixis, moving in bed but generally weak, no focal deficits Psych: Pleasant, mildly distractible.   Assessment and Plan: I have seen and evaluated the patient as outlined above. I agree with the formulated Assessment and Plan as detailed in the residents' note, with the following changes:   1. Acute encephalopathy - Given his recovery, my primary diagnosis would be hepatic encephalopathy, possibly triggered by a GI illness leading to  dehydration/confusion.   His blood work today is relatively normal with low platelets likely due to chronic liver disease.  - He has follow up with GI outpatient for a repeat colonoscopy and he should likely have follow up for cirrhosis - Given his h/o HCV, he would need routine screening with abdominal US for [place] - Lactulose, titrate to 2 bowel movements per day - PT/OT - Monitor for fever - Follow up cultures - F/U EEG if completed - Sz precautions - Advance diet as tolerated  Other issues per Resident Daily Note.   Sid Falcon, MD 11/6/20219:30 AM

## 2020-07-28 NOTE — Progress Notes (Addendum)
NAME:  Matthew Roman, MRN:  [id number], DOB:  03-06-1956, LOS: 1 ADMISSION DATE:  07/27/2020  Subjective  No significant overnight events.  Much more cognitively intact this morning. Discussed cirrhosis and possibility of presenting sx being attributable to hepatic encephalopathy.  He also notes that he has been having diarrhea over the past 3w--3BM/day. Endorses persistent abdominal discomfort that has no exacerbating or alleviating factors. Denies fevers but has been feeling more cold than usual. Denies melena or hematochezia.   Objective   Blood pressure (!) 142/77, pulse 75, temperature 98.1 F (36.7 C), temperature source Oral, resp. rate 18, SpO2 97 %.    No intake or output data in the 24 hours ending 07/28/20 0533 There were no vitals filed for this visit.  Examination: General: chronically ill appearing HEENT: dry mucous membranes Cardiac: RRR. No LE edema Pulm: breathing comfortably on room air. Lungs clear to auscultation Abd: no tenderness to palpation. No suprapubic tenderness Skin: palmar erythema. Spider telangectasias on chest.  Neuro: a/o. Follows commands. Mild expressive dysphasia. Asterixis present.  Consults:  none  Significant Diagnostic Tests:  Brain MRI>>study limited due to patient's encephalopathy. Negative for acute infarct.   Liver US>>liver cirrhosis present.   Micro Data:  11/5 blood cultures>>NGTD  Antimicrobials:  none  Labs    CBC Latest Ref Rng & Units 07/28/2020 07/27/2020 07/27/2020  WBC 4.0 - 10.5 K/uL 6.9 - 8.2  Hemoglobin 13.0 - 17.0 g/dL 13.5 13.9 14.6  Hematocrit 39 - 52 % 41.0 41.0 44.7  Platelets 150 - 400 K/uL 86(L) - 103(L)   BMP Latest Ref Rng & Units 07/28/2020 07/27/2020 07/27/2020  Glucose 70 - 99 mg/dL 118(H) - -  BUN 8 - 23 mg/dL 14 - -  Creatinine 0.61 - 1.24 mg/dL 0.88 1.02 -  Sodium 135 - 145 mmol/L 143 - 143  Potassium 3.5 - 5.1 mmol/L 3.1(L) - 3.7  Chloride 98 - 111 mmol/L 108 - -  CO2 22 - 32 mmol/L 24 - -   Calcium 8.9 - 10.3 mg/dL 8.9 - -    Summary  18 yom with expressive aphasia who was admitted to [facility] on 11/5 for further evaluation of acute encephalopathy.  Assessment & Plan:  Active Problems:   Acute encephalopathy  Hepatic encephalopathy -sx are significantly improved, if not resolved, this morning. He does mention that he was having diarrhea over the past 3w. Question if he did not become fluid depleted resulting in an acute injury to his liver, resulting in hepatic encephalopathy. -remainder of labs including electrolytes, UA, CXR, [place], ETOH, TSH all unrevealing. Head imaging has been unremarkable as well. I had questioned the possibility of seizure causing a post-ictal state however, based on today's findings, would consider this less likely. Liver Cirrhosis secondary to prior alcohol use and HCV (now treated) -cirrhosis confirmed on RUQ [place] Plan --d/c EEG --start lactulose. Titrate to 2-3BM per day. --will need ongoing monitoring with GI outpatient  Diarrhea (3w history). No infectious symptoms however given the length of time it has been going on, I think it is reasonable to obtain a GI panel to r/o infectious etiologies. No known risk factors for C-Diff.  Hypokalemia. Likely in the setting of diarrhea. Replete as needed.will add on magnesium level to this morning's labs.  Fever (resolved). No white count. CXR/UA normal. No abd tenderness on exam.  Plan -continue to follow blood cultures  DM. On metformin 500mg  BID at home. Blood sugars stable at this time without insulin. Continue to monitor with  intermittent glucose checks. Suspect CBGs to start to go up as he starts to eat.  HTN. Blood pressures stable at this time.  History of Colon cancer (dx 2018). Elected to forgo surgery at that time. Will need to follow up with GI for colonoscopy as previously scheduled  Best practice:  CODE STATUS: Full Diet: Reg DVT for prophylaxis: lovenox Dispo: suspect d/c home tomorrow  pending continued improvement   Mitzi Hansen, MD Internal Medicine Resident PGY-2 [facility] Contact info:  -Please page me at [phone] from 7am-5pm M-F.  -Please use the [facility] after hours pager at [phone] after 5pm M-F and on weekends 07/28/2020 10:25 AM

## 2020-07-29 DIAGNOSIS — R441 Visual hallucinations: Secondary | ICD-10-CM

## 2020-07-29 DIAGNOSIS — E119 Type 2 diabetes mellitus without complications: Secondary | ICD-10-CM

## 2020-07-29 DIAGNOSIS — E876 Hypokalemia: Secondary | ICD-10-CM | POA: Diagnosis present

## 2020-07-29 DIAGNOSIS — K7682 Hepatic encephalopathy: Secondary | ICD-10-CM | POA: Diagnosis present

## 2020-07-29 DIAGNOSIS — I10 Essential (primary) hypertension: Secondary | ICD-10-CM | POA: Diagnosis present

## 2020-07-29 DIAGNOSIS — K7290 Hepatic failure, unspecified without coma: Secondary | ICD-10-CM | POA: Diagnosis present

## 2020-07-29 HISTORY — DX: Type 2 diabetes mellitus without complications: E11.9

## 2020-07-29 HISTORY — DX: Hypokalemia: E87.6

## 2020-07-29 HISTORY — DX: Visual hallucinations: R44.1

## 2020-07-29 HISTORY — DX: Hepatic encephalopathy: K76.82

## 2020-07-29 HISTORY — DX: Essential (primary) hypertension: I10

## 2020-07-29 LAB — GLUCOSE, CAPILLARY
Glucose-Capillary: 141 mg/dL — ABNORMAL HIGH (ref 70–99)
Glucose-Capillary: 158 mg/dL — ABNORMAL HIGH (ref 70–99)
Glucose-Capillary: 155 mg/dL — ABNORMAL HIGH (ref 70–99)

## 2020-07-29 LAB — BASIC METABOLIC PANEL
Anion gap: 12 (ref 5–15)
BUN: 9 mg/dL (ref 8–23)
CO2: 23 mmol/L (ref 22–32)
Calcium: 9.1 mg/dL (ref 8.9–10.3)
Chloride: 105 mmol/L (ref 98–111)
Creatinine, Ser: 0.87 mg/dL (ref 0.61–1.24)
GFR, Estimated: >60 mL/min (ref >60)
Glucose, Bld: 88 mg/dL (ref 70–99)
Potassium: 3.1 mmol/L — ABNORMAL LOW (ref 3.5–5.1)
Sodium: 140 mmol/L (ref 135–145)

## 2020-07-29 LAB — LIPASE, BLOOD: Lipase: 47 U/L (ref 11–51)

## 2020-07-29 MED ORDER — LACTULOSE 10 GM/15ML PO SOLN
10.0000 g | Freq: Every day | ORAL | Status: DC
  Administered 2020-07-29 – 2020-07-30 (×2): 10 g via ORAL
  Filled 2020-07-29 (×2): qty 30

## 2020-07-29 MED ORDER — INSULIN ASPART 100 UNIT/ML ~~LOC~~ SOLN
0.0000 [IU] | Freq: Three times a day (TID) | SUBCUTANEOUS | Status: DC
  Administered 2020-07-29: 2 [IU] via SUBCUTANEOUS
  Administered 2020-07-29 – 2020-07-30 (×2): 3 [IU] via SUBCUTANEOUS
  Administered 2020-07-31: 5 [IU] via SUBCUTANEOUS
  Administered 2020-07-31: 2 [IU] via SUBCUTANEOUS

## 2020-07-29 MED ORDER — ALUM & MAG HYDROXIDE-SIMETH 200-200-20 MG/5ML PO SUSP
15.0000 mL | Freq: Once | ORAL | Status: AC
  Administered 2020-07-29: 15 mL via ORAL
  Filled 2020-07-29: qty 30

## 2020-07-29 MED ORDER — AMLODIPINE BESYLATE 10 MG PO TABS
10.0000 mg | ORAL_TABLET | Freq: Every day | ORAL | Status: DC
  Administered 2020-07-29 – 2020-07-31 (×3): 10 mg via ORAL
  Filled 2020-07-29 (×3): qty 1

## 2020-07-29 MED ORDER — MELATONIN 3 MG PO TABS
3.0000 mg | ORAL_TABLET | Freq: Once | ORAL | Status: AC
  Administered 2020-07-29: 3 mg via ORAL
  Filled 2020-07-29: qty 1

## 2020-07-29 MED ORDER — POTASSIUM CHLORIDE CRYS ER 20 MEQ PO TBCR
40.0000 meq | EXTENDED_RELEASE_TABLET | ORAL | Status: AC
  Administered 2020-07-29 (×2): 40 meq via ORAL
  Filled 2020-07-29 (×2): qty 2

## 2020-07-29 MED ORDER — THIAMINE HCL 100 MG PO TABS
100.0000 mg | ORAL_TABLET | Freq: Every day | ORAL | Status: DC
  Administered 2020-07-29 – 2020-07-31 (×3): 100 mg via ORAL
  Filled 2020-07-29 (×3): qty 1

## 2020-07-29 MED ORDER — FOLIC ACID 1 MG PO TABS
1.0000 mg | ORAL_TABLET | Freq: Every day | ORAL | Status: DC
  Administered 2020-07-29 – 2020-07-31 (×3): 1 mg via ORAL
  Filled 2020-07-29 (×3): qty 1

## 2020-07-29 NOTE — Evaluation (Signed)
Physical Therapy Evaluation Patient Details Name: Matthew Roman MRN: [id number] DOB: July 11, 1956 Today's Date: 07/29/2020   History of Present Illness  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 MRI neg for acute changes.  Clinical Impression  Pt admitted with above diagnosis. Pt presents with confusion and hallucinations as well as STM deficits and expressive difficulties (some of this is baseline since CVA). Pt min-guard A with mobility but unsafe due to decreased awareness of deficits. Ambulated 150' with RW and was unsteady with frequent bumping of hand on R sided objects.  Pt currently with functional limitations due to the deficits listed below (see PT Problem List). Pt will benefit from skilled PT to increase their independence and safety with mobility to allow discharge to the venue listed below.       Follow Up Recommendations Home health PT    Equipment Recommendations  None recommended by PT    Recommendations for Other Services       Precautions / Restrictions Precautions Precautions: Fall Precaution Comments: fell prior to admission, reports that he passed out Restrictions Weight Bearing Restrictions: No      Mobility  Bed Mobility Overal bed mobility: Needs Assistance Bed Mobility: Supine to Sit     Supine to sit: Supervision     General bed mobility comments: increased time and effort, HOB slightly elevated, no physical assist required, supervision for safety    Transfers Overall transfer level: Needs assistance Equipment used: Rolling walker (2 wheeled) Transfers: Sit to/from Stand Sit to Stand: Min  guard         General transfer comment: for balance and safety  Ambulation/Gait Ambulation/Gait assistance: Min guard Gait Distance (Feet): 150 Feet Assistive device: Rolling walker (2 wheeled) Gait Pattern/deviations: Step-through pattern Gait velocity: decreased Gait velocity interpretation: <1.8 ft/sec, indicate of risk for recurrent falls General Gait Details: pt generally unsteady and frequently hit R wall and doorway. Gave him option to ambulate without RW as well since he reports not using it at home but he kept it  Financial trader [employer] (Stroke Patients Only)       Balance Overall balance assessment: Needs assistance Sitting-balance support: Feet supported Sitting balance-Leahy Scale: Good     Standing balance support: Bilateral upper extremity supported;No upper extremity supported;During functional activity Standing balance-Leahy Scale: Fair Standing balance comment: min-guard in standiing                             Pertinent Vitals/Pain Pain Assessment: Faces Faces Pain Scale: Hurts little more Pain Location: abdomen Pain Descriptors / Indicators: Discomfort Pain Intervention(s): Monitored during session    Home Living Family/patient expects to be discharged to:: Private residence Living Arrangements: Other relatives Available Help at Discharge: Family;Available PRN/intermittently Type of Home: House Home Access: Stairs to enter Entrance Stairs-Rails: None Entrance Stairs-Number of Steps: 2 Home Layout: One level Home Equipment: Cane - single point;Bedside commode;Walker - 2 wheels Additional Comments: he reports that he is alone in the home most of the time. He states that his sister and her  family live with him (per chart, it looks like it is his daughter, not sister)    Prior Function Level of Independence: Independent         Comments: independent per his report but pt is poor historian      Hand Dominance   Dominant Hand: Right    Extremity/Trunk Assessment   Upper Extremity Assessment Upper Extremity Assessment: Defer to OT evaluation    Lower Extremity Assessment Lower Extremity Assessment: Generalized weakness    Cervical / Trunk Assessment Cervical / Trunk Assessment: Normal  Communication   Communication: Expressive difficulties (at baseline)  Cognition Arousal/Alertness: Awake/alert Behavior During Therapy: WFL for tasks assessed/performed Overall Cognitive Status: Impaired/Different from baseline Area of Impairment: Memory;Safety/judgement;Problem solving                     Memory: Decreased short-term memory   Safety/Judgement: Decreased awareness of safety;Decreased awareness of deficits   Problem Solving: Difficulty sequencing;Requires verbal cues General Comments: pt with delayed responses and requiring increased time for processing, some difficulty/confusion with recall and relaying homesetup/DME info. Some of this is baseline since CVA but unclear how much      General Comments General comments (skin integrity, edema, etc.): pt reports that his wife died because she got old, could not remember any more details. Also relys that he has been seeing people in his room (he is aware that these are hallucinations)    Exercises     Assessment/Plan    PT Assessment Patient needs continued PT services  PT Problem List Decreased balance;Decreased mobility;Decreased knowledge of use of DME;Decreased cognition;Decreased strength;Decreased knowledge of precautions;Pain       PT Treatment Interventions DME instruction;Gait training;Stair training;Functional mobility training;Therapeutic activities;Therapeutic exercise;Balance training;Cognitive remediation;Patient/family education    PT Goals (Current goals can be found in the Care Plan section)  Acute Rehab PT Goals Patient Stated Goal: return home PT Goal Formulation: With patient Time  For Goal Achievement: 08/12/20 Potential to Achieve Goals: Good    Frequency Min 3X/week   Barriers to discharge Decreased caregiver support alone most of the day    Co-evaluation PT/OT/SLP Co-Evaluation/Treatment: Yes Reason for Co-Treatment: Necessary to address cognition/behavior during functional activity PT goals addressed during session: Mobility/safety with mobility;Balance;Proper use of DME         AM-PAC PT "6 Clicks" Mobility  Outcome Measure Help needed turning from your back to your side while in a flat bed without using bedrails?: None Help needed moving from lying on your back to sitting on the side of a flat bed without using bedrails?: None Help needed moving to and from a bed to a chair (including a wheelchair)?: A Little Help needed standing up from a chair using your arms (e.g., wheelchair or bedside chair)?: A Little Help needed to walk in hospital room?: A Little Help needed climbing 3-5 steps with a railing? : A Little 6 Click Score: 20    End of Session Equipment Utilized During Treatment: Gait belt Activity Tolerance: Patient tolerated treatment well Patient left: in chair;with call bell/phone within reach;with chair alarm set Nurse Communication: Mobility status PT Visit Diagnosis: Unsteadiness on feet (R26.81)    Time: 8338-2505 PT Time Calculation (min) (ACUTE ONLY): 24 min   Charges:   PT Evaluation $PT Eval Moderate Complexity: [place], [place]  Pager [phone] Office [place] 07/29/2020, 9:40 AM

## 2020-07-29 NOTE — Progress Notes (Addendum)
Subjective:   Hospital day: 2  Overnight event: No acute event overnight  Patient is seen at bedside.  He is awake and alert.  He is oriented to person, place and time but not to situation.  He states that he has had some visual hallucination overnight which he saw people that were not there.  No auditory hallucination or agitation.  Confirmed with RN.  No hallucination this morning.  He states that this problem started when he was hospitalized.  He also complaining of epigastric pain that has been going on for a month and headache has been going on for years.  He has had 6 bowel movements in the last 24 hours while on lactulose.  Patient lives at home with his sister and her family.  He states that he normally can do everything by himself.  He used to drink a lot and quit drinking 6 years ago.  Still smoke one half a pack a day.  Denies any drug use.  Objective:  Vital signs in last 24 hours: Vitals:   07/28/20 1929 07/28/20 2353 07/29/20 0418 07/29/20 0722  BP: 136/79 130/82 (!) 155/81 (!) 163/80  Pulse: 61 67 64 69  Resp: 18 18 18 18   Temp: 98.1 F (36.7 C) 97.6 F (36.4 C) 98.4 F (36.9 C) 98.4 F (36.9 C)  TempSrc: Oral  Oral   SpO2: 96% 97% 95% 97%   CBC Latest Ref Rng & Units 07/28/2020 07/27/2020 07/27/2020  WBC 4.0 - 10.5 K/uL 6.9 - 8.2  Hemoglobin 13.0 - 17.0 g/dL 13.5 13.9 14.6  Hematocrit 39 - 52 % 41.0 41.0 44.7  Platelets 150 - 400 K/uL 86(L) - 103(L)   CMP Latest Ref Rng & Units 07/28/2020 07/27/2020 07/27/2020  Glucose 70 - 99 mg/dL 118(H) - -  BUN 8 - 23 mg/dL 14 - -  Creatinine 0.61 - 1.24 mg/dL 0.88 1.02 -  Sodium 135 - 145 mmol/L 143 - 143  Potassium 3.5 - 5.1 mmol/L 3.1(L) - 3.7  Chloride 98 - 111 mmol/L 108 - -  CO2 22 - 32 mmol/L 24 - -  Calcium 8.9 - 10.3 mg/dL 8.9 - -  Total Protein 6.5 - 8.1 g/dL - - -  Total Bilirubin 0.3 - 1.2 mg/dL - - -  Alkaline Phos 38 - 126 U/L - - -  AST 15 - 41 U/L - - -  ALT 0 - 44 U/L - - -     Physical  Exam Physical Exam Constitutional:      General: He is not in acute distress.    Appearance: Normal appearance. He is not toxic-appearing.  HENT:     Head: Normocephalic.  Eyes:     General:        Right eye: No discharge.        Left eye: No discharge.     Pupils: Pupils are equal, round, and reactive to light.  Cardiovascular:     Rate and Rhythm: Normal rate and regular rhythm.  Pulmonary:     Effort: No respiratory distress.     Breath sounds: Normal breath sounds.  Abdominal:     General: Bowel sounds are normal.     Palpations: Abdomen is soft.     Tenderness: There is abdominal tenderness (Mild tenderness to palpation of the epigastric region).  Musculoskeletal:     Cervical back: Normal range of motion.     Right lower leg: No edema.     Left lower leg: No edema.  Skin:    General: Skin is warm.     Coloration: Skin is not jaundiced.  Neurological:     Mental Status: He is alert.  Psychiatric:        Mood and Affect: Mood normal.      Assessment/Plan: 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  Active Problems:   Acute encephalopathy   History of cirrhosis   Essential hypertension  Acute hepatic encephalopathy History of liver cirrhosis secondary to alcohol use and HCV Patient presents to the ED for altered mental status secondary to hepatic encephalopathy, likely due to dehydration from GI illness.  He does have history of liver cirrhosis secondary to past alcohol use and treated HCV.  Ammonia however is within normal limits.  LFTs also within normal limits.  Brain MRI was negative for any acute changes.   Fever resolved.  Patient was given lactulose and result significant improvement of mental status.  Patient is alert and awake today however complains of visual hallucination overnight without any agitations.  Suspect to have underlying dementia or Wernicke/Korsakoff and sundowner syndrome.  Vital signs stable with elevated blood pressure.  Given his report of 6 bowel movements a day, decrease lactulose.  -Lactulose 10 g once a day, titrate to 2-3 bowel moments per day -Thiamine and folate for possible Wernicke -Monitor for fever -Monitor vital signs -Delirium precautions -PT/OT -Follow-up with GI outpatient for repeat colonoscopy and follow-up for cirrhosis   Epigastric abdominal pain Likely due to acid reflux.  Given his history of alcoholics, will obtain lipase to rule out pancreatitis. -Maalox once -Check lipase to rule out pancreatitis   Hypertension Increase amlodipine to 10 mg a day   Hypokalemia Continue repleting if low   Diabetes mellitus Patient is on Metformin.  Sliding scale insulin when patient starts diet   Diet: Heart healthy IVF: No VTE: Lovenox CODE: Full  Prior to Admission Living Arrangement: Home Anticipated Discharge Location: To be determined Barriers to Discharge: PT/OT Dispo: Anticipated discharge in approximately 1-2 day(s).   Matthew Gerold, DO 07/29/2020, 8:07 AM Pager: [phone] After 5pm on weekdays and 1pm on weekends: On Call pager [phone]

## 2020-07-29 NOTE — Evaluation (Signed)
Occupational Therapy Evaluation Patient Details Name: Matthew Roman MRN: [id number] DOB: 1956-06-27 Today's Date: 07/29/2020    History of Present Illness Mr. Charels Roman is a 64 year old man with past medical history of HTN, HLD, T2DM, COPD, current tobacco use, cirrhosis, prior CVA (multiple right MCA/ACA infarcts in December of 2019 with residual deficits of mild confusion, mixed dysarthria and expressive aphasia), and colorectal carcinoma (dx in 2018, but no treatment) who presented to [facility] on 07/27/2020 with AMS in setting of recent GI illness. MRI neg for acute changes.   Clinical Impression   This 64 y/o male presents with the above. PTA pt reports living with family (pt reports sister however per chart pt living with daughter), reports performing ADL and mobility tasks independently. Pt currently performing ADL and mobility tasks using RW grossly at minA level. Pt with impaired cognition and requiring increased time for processing and responding to questions/following commands. Pt to benefit from continued acute OT services and currently recommend follow up [facility] services after discharge to maximize his safety and independence with ADL and mobility. Given pt's cognitive status recommend he have 24hr assist initially at time of discharge for added safety with ADL, iADL and mobiltiy.     Follow Up Recommendations  Home health OT;Supervision/Assistance - 24 hour    Equipment Recommendations  None recommended by OT           Precautions / Restrictions Precautions Precautions: Fall Precaution Comments: fell prior to admission, reports that he passed out Restrictions Weight Bearing Restrictions: No      Mobility Bed Mobility Overal bed mobility: Needs Assistance Bed Mobility: Supine to Sit     Supine to sit: Supervision     General bed mobility comments: increased time and effort, HOB slightly elevated, no physical assist required, supervision for safety     Transfers Overall transfer level: Needs assistance Equipment used: Rolling walker (2 wheeled) Transfers: Sit to/from Stand Sit to Stand: Min guard         General transfer comment: for balance and safety    Balance Overall balance assessment: Needs assistance Sitting-balance support: Feet supported Sitting balance-Leahy Scale: Good     Standing balance support: Bilateral upper extremity supported;No upper extremity supported;During functional activity Standing balance-Leahy Scale: Fair Standing balance comment: close minguard for balance during standing grooming ADL, pt with occasional posterior sway                           ADL either performed or assessed with clinical judgement   ADL Overall ADL's : Needs assistance/impaired Eating/Feeding: Modified independent;Sitting   Grooming: Minimal assistance;Standing;Wash/dry face;Oral care Grooming Details (indicate cue type and reason): close minguard to light minA for standing balance, pt with occasional posterior bias/sway Upper Body Bathing: Min guard;Sitting   Lower Body Bathing: Minimal assistance;Sit to/from stand   Upper Body Dressing : Min guard;Sitting   Lower Body Dressing: Minimal assistance;Sit to/from stand Lower Body Dressing Details (indicate cue type and reason): donning socks without assist, increased time/effort; minA for standing balance  Toilet Transfer: Minimal assistance;Ambulation;RW Toilet Transfer Details (indicate cue type and reason): minguard - minA , simulated via transfer to recliner, room level mobility  Toileting- Clothing Manipulation and Hygiene: Minimal assistance;Sit to/from stand       Functional mobility during ADLs: Minimal assistance;Min guard;Rolling walker       Vision         Perception Perception Comments: noted pt buming into items x2 isntances to his  R when mobilizing in the hallway    Praxis      Pertinent Vitals/Pain Pain Assessment: Faces Faces Pain  Scale: Hurts little more Pain Location: abdomen Pain Descriptors / Indicators: Discomfort Pain Intervention(s): Monitored during session;Limited activity within patient's tolerance     Hand Dominance Right   Extremity/Trunk Assessment Upper Extremity Assessment Upper Extremity Assessment: Overall WFL for tasks assessed   Lower Extremity Assessment Lower Extremity Assessment: Defer to PT evaluation   Cervical / Trunk Assessment Cervical / Trunk Assessment: Normal   Communication Communication Communication: Expressive difficulties (at baseline)   Cognition Arousal/Alertness: Awake/alert Behavior During Therapy: WFL for tasks assessed/performed Overall Cognitive Status: Impaired/Different from baseline Area of Impairment: Memory;Safety/judgement;Problem solving                     Memory: Decreased short-term memory   Safety/Judgement: Decreased awareness of safety;Decreased awareness of deficits   Problem Solving: Difficulty sequencing;Requires verbal cues General Comments: pt with delayed responses and requiring increased time for processing, some difficulty/confusion with recall and relaying homesetup/DME info. Some of this is baseline since CVA but unclear how much. states he lives with his sister however pt chart pt living with daughter    General Comments  pt reports that his wife died because she got old, could not remember any more details. Also relys that he has been seeing people in his room (he is aware that these are hallucinations)    Exercises     Shoulder Instructions      Home Living Family/patient expects to be discharged to:: Private residence Living Arrangements: Other relatives Available Help at Discharge: Family;Available PRN/intermittently Type of Home: House Home Access: Stairs to enter [employer] of Steps: 2 Entrance Stairs-Rails: None Home Layout: One level     Bathroom Shower/Tub: Teacher, early years/pre:  Standard     Home Equipment: Cane - single point;Bedside commode;Walker - 2 wheels   Additional Comments: he reports that he is alone in the home most of the time. He states that his sister and her family live with him (per chart, it looks like it is his daughter, not sister)      Prior Functioning/Environment Level of Independence: Independent        Comments: independent per his report but pt is poor historian        OT Problem List: Decreased strength;Decreased range of motion;Decreased activity tolerance;Impaired balance (sitting and/or standing);Decreased cognition;Decreased safety awareness;Decreased knowledge of use of DME or AE      OT Treatment/Interventions: Self-care/ADL training;Therapeutic exercise;Energy conservation;DME and/or AE instruction;Therapeutic activities;Cognitive remediation/compensation;Patient/family education;Balance training    OT Goals(Current goals can be found in the care plan section) Acute Rehab OT Goals Patient Stated Goal: return home OT Goal Formulation: With patient Time For Goal Achievement: 08/12/20 Potential to Achieve Goals: Good  OT Frequency: Min 2X/week   Barriers to D/C:            Co-evaluation PT/OT/SLP Co-Evaluation/Treatment: Yes Reason for Co-Treatment: Necessary to address cognition/behavior during functional activity PT goals addressed during session: Mobility/safety with mobility;Balance;Proper use of DME OT goals addressed during session: ADL's and self-care      AM-PAC OT "6 Clicks" Daily Activity     Outcome Measure Help from another person eating meals?: None Help from another person taking care of personal grooming?: A Little Help from another person toileting, which includes using toliet, bedpan, or urinal?: A Little Help from another person bathing (including washing, rinsing, drying)?: A Little Help  from another person to put on and taking off regular upper body clothing?: A Little Help from another person  to put on and taking off regular lower body clothing?: A Little 6 Click Score: 19   End of Session Equipment Utilized During Treatment: Gait belt;Rolling walker Nurse Communication: Mobility status  Activity Tolerance: Patient tolerated treatment well Patient left: in chair;with call bell/phone within reach;with chair alarm set  OT Visit Diagnosis: Unsteadiness on feet (R26.81);Other symptoms and signs involving cognitive function                Time: 2993-7169 OT Time Calculation (min): 24 min Charges:  OT General Charges $OT Visit: 1 Visit OT Evaluation $OT Eval Moderate Complexity: [place], OT Acute Rehabilitation Services Pager [phone] Office [phone]  Raymondo Band 07/29/2020, 9:51 AM

## 2020-07-30 DIAGNOSIS — C189 Malignant neoplasm of colon, unspecified: Secondary | ICD-10-CM

## 2020-07-30 DIAGNOSIS — Z720 Tobacco use: Secondary | ICD-10-CM

## 2020-07-30 DIAGNOSIS — E785 Hyperlipidemia, unspecified: Secondary | ICD-10-CM

## 2020-07-30 DIAGNOSIS — J449 Chronic obstructive pulmonary disease, unspecified: Secondary | ICD-10-CM

## 2020-07-30 DIAGNOSIS — R1013 Epigastric pain: Secondary | ICD-10-CM

## 2020-07-30 DIAGNOSIS — R441 Visual hallucinations: Secondary | ICD-10-CM

## 2020-07-30 LAB — BASIC METABOLIC PANEL
Anion gap: 10 (ref 5–15)
BUN: 11 mg/dL (ref 8–23)
CO2: 23 mmol/L (ref 22–32)
Calcium: 8.9 mg/dL (ref 8.9–10.3)
Chloride: 108 mmol/L (ref 98–111)
Creatinine, Ser: 0.77 mg/dL (ref 0.61–1.24)
GFR, Estimated: >60 mL/min (ref >60)
Glucose, Bld: 127 mg/dL — ABNORMAL HIGH (ref 70–99)
Potassium: 3.9 mmol/L (ref 3.5–5.1)
Sodium: 141 mmol/L (ref 135–145)

## 2020-07-30 LAB — GLUCOSE, CAPILLARY
Glucose-Capillary: 120 mg/dL — ABNORMAL HIGH (ref 70–99)
Glucose-Capillary: 119 mg/dL — ABNORMAL HIGH (ref 70–99)
Glucose-Capillary: 171 mg/dL — ABNORMAL HIGH (ref 70–99)
Glucose-Capillary: 102 mg/dL — ABNORMAL HIGH (ref 70–99)

## 2020-07-30 MED ORDER — LACTULOSE 10 GM/15ML PO SOLN: 5.0000 g | Freq: Every day | ORAL | 0 refills | Status: DC

## 2020-07-30 MED ORDER — LACTULOSE 10 GM/15ML PO SOLN
5.0000 g | Freq: Every day | ORAL | Status: DC
  Administered 2020-07-31: 5 g via ORAL
  Filled 2020-07-30: qty 30

## 2020-07-30 NOTE — Progress Notes (Addendum)
Subjective:   Hospital day: 3  Overnight event: No acute event overnight  This morning, patient is awake, alert and oriented x4.  He states that he is still experiencing mild epigastric pain, however the maalox resolved his pain overnight.  Patient states that he has about seven loose bowel movements yesterday.  He denies any hallucinations this morning or overnight. He feels back to his baseline and denies any new or worsening symptoms. He has no further questions or concerns.  Objective:  Vital signs in last 24 hours: Vitals:   07/29/20 1748 07/29/20 1937 07/29/20 2342 07/30/20 0359  BP: (!) 146/87 (!) 147/84 137/83 (!) 155/79  Pulse: 71 62 61 (!) 51  Resp: 18 18 18 18   Temp: 98.3 F (36.8 C) (!) 97.5 F (36.4 C) 98.1 F (36.7 C) 98.1 F (36.7 C)  TempSrc:  Oral Oral Oral  SpO2: 97% 98% 97% 99%   CBC Latest Ref Rng & Units 07/28/2020 07/27/2020 07/27/2020  WBC 4.0 - 10.5 K/uL 6.9 - 8.2  Hemoglobin 13.0 - 17.0 g/dL 13.5 13.9 14.6  Hematocrit 39 - 52 % 41.0 41.0 44.7  Platelets 150 - 400 K/uL 86(L) - 103(L)   CMP Latest Ref Rng & Units 07/30/2020 07/29/2020 07/28/2020  Glucose 70 - 99 mg/dL 127(H) 88 118(H)  BUN 8 - 23 mg/dL 11 9 14   Creatinine 0.61 - 1.24 mg/dL 0.77 0.87 0.88  Sodium 135 - 145 mmol/L 141 140 143  Potassium 3.5 - 5.1 mmol/L 3.9 3.1(L) 3.1(L)  Chloride 98 - 111 mmol/L 108 105 108  CO2 22 - 32 mmol/L 23 23 24   Calcium 8.9 - 10.3 mg/dL 8.9 9.1 8.9  Total Protein 6.5 - 8.1 g/dL - - -  Total Bilirubin 0.3 - 1.2 mg/dL - - -  Alkaline Phos 38 - 126 U/L - - -  AST 15 - 41 U/L - - -  ALT 0 - 44 U/L - - -    Physical Exam  Physical Exam Constitutional:      General: He is not in acute distress.    Appearance: He is not toxic-appearing.     Comments: Alert, awake, oriented x4.  Able to do simple math.  HENT:     Head: Normocephalic.  Eyes:     General:        Right eye: No discharge.        Left eye: No discharge.  Cardiovascular:     Rate and Rhythm:  Normal rate and regular rhythm.  Pulmonary:     Effort: No respiratory distress.     Breath sounds: Normal breath sounds.  Abdominal:     General: Bowel sounds are normal.     Palpations: Abdomen is soft.     Tenderness: There is abdominal tenderness (Tenderness to palpation at the epigastric region, no guarding).  Musculoskeletal:     Cervical back: Normal range of motion.     Right lower leg: No edema.     Left lower leg: No edema.  Skin:    General: Skin is warm.  Neurological:     Mental Status: He is alert.  Psychiatric:        Mood and Affect: Mood normal.     Assessment/Plan: [name] is a 64 year old man with past medical history of HTN, HLD, T2DM, COPD, current tobacco use, cirrhosis,prior CVA (multiple right MCA/ACAinfarcts in December of 2019 with residual deficits ofmild confusion,mixed dysarthria and expressive aphasia), and colorectal carcinoma (dx in 2018, but no  treatment)who was admitted for altered mental status secondary to hepatic encephalopathy..  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  He does have history of liver cirrhosis secondary to past alcohol use and treated HCV.  Ammonia however is within normal limits.  LFTs also within normal limits.  Brain MRI was negative for any acute changes.  Fever resolved.  Patient was given lactulose and result significant improvement of mental status.  His mental status is back to baseline.  Given that patient has seven bowel movement yesterday, reduce lactulose of 5 g once a day.  Patient  will be discharged home with home health PT.  Will follow up with his primary care physician and GI for follow-up. -Lactulose 5 g once a day, titrate to 2-3 bowel moments per day -Thiamine and folate for possible [place] -Monitor for fever -Monitor vital signs -Delirium precautions -PT/OT -Follow-up with GI outpatient for repeat colonoscopy and follow-up for cirrhosis   Epigastric abdominal pain Likely due to acid reflux.  Lipase within normal limits, low suspicion for pancreatitis.   -Continue PPI -Maalox as needed    Hypertension Amlodipine to 10 mg a day   Diabetes mellitus Patient is on Metformin.  Sliding scale insulin when patient starts diet   Addendum: I called and spoke to the patient's daughter.  She expressed concern about the 24-hour supervision at home.  She states that everybody in the family is working every day and patient is going to be at home alone in the morning.  Patient's daughter has spoken to the Education officer, museum and would like to have patient in a SNF in the meantime.  Patient agrees with the plan.  Social worker has faxed out his information.  Awaits bed placement.   Diet: Heart healthy IVF: No VTE: Lovenox CODE: Full  Prior to Admission Living Arrangement: Home Anticipated Discharge Location:  SNF Barriers to Discharge:  Bed placement Dispo: Anticipated discharge in approximately 1-2 day Matthew Gerold, DO 07/30/2020, 6:40 AM Pager: [phone] After 5pm on weekdays and 1pm on weekends: On Call pager [phone]

## 2020-07-30 NOTE — NC FL2 (Signed)
[place] LEVEL OF CARE SCREENING TOOL     IDENTIFICATION  Patient Name: Matthew Roman Birthdate: [birth year] Sex: male Admission Date (Current Location): 07/27/2020  [facility] and [place] Number:  [employer] and Address:  [facility] [place] [place] [address] [place], [place] [address]      Provider Number: [id number]  Attending Physician Name and Address:  Sid Falcon, MD  Relative Name and Phone Number:       Current Level of Care: Hospital Recommended Level of Care: [place] Prior Approval Number:    Date Approved/Denied:   PASRR Number: [id number]  Discharge Plan: SNF    Current Diagnoses: Patient Active Problem List   Diagnosis Date Noted  . Essential hypertension 07/29/2020  . Hypokalemia 07/29/2020  . Encephalopathy, hepatic ([place]) 07/29/2020  . DM II (diabetes mellitus, type II), controlled ([place]) 07/29/2020  . Visual hallucinations 07/29/2020  . History of cirrhosis   . Acute encephalopathy 09/02/2018  . Multiple lacunar infarcts ([place]) 09/02/2018  . Fracture of femoral neck, right, closed ([place]) 09/02/2018  . TOBACCO DEPENDENCE 11/19/2006  . BACK PAIN, LOW 11/19/2006    Orientation RESPIRATION BLADDER Height & Weight     Self, Place  Normal Continent Weight:   Height:     BEHAVIORAL SYMPTOMS/MOOD NEUROLOGICAL BOWEL NUTRITION STATUS      Continent Diet (heart healthy with thin liquids)  AMBULATORY STATUS COMMUNICATION OF NEEDS Skin   Limited Assist Verbally Normal                       Personal Care Assistance Level of Assistance  Bathing, Feeding, Dressing Bathing Assistance: Limited assistance Feeding assistance: Independent Dressing Assistance: Limited assistance     Functional Limitations Info  Sight, Hearing, Speech Sight Info: Impaired Hearing Info: Adequate Speech Info: Adequate    SPECIAL CARE FACTORS FREQUENCY  PT (By licensed PT), OT (By licensed OT)     PT Frequency:  5x/wk OT Frequency: 5x/wk            Contractures Contractures Info: Not present    Additional Factors Info  Code Status, Allergies, Insulin Sliding Scale Code Status Info: Full Allergies Info: NKA   Insulin Sliding Scale Info: Novolog 0-15 units SQ three times a day       Current Medications (07/30/2020):  This is the current hospital active medication list Current Facility-Administered Medications  Medication Dose Route Frequency Provider Last Rate Last Admin  . amLODipine (NORVASC) tablet 10 mg  10 mg Oral Daily Gaylan Gerold, DO   10 mg at 07/30/20 0958  . atorvastatin (LIPITOR) tablet 20 mg  20 mg Oral q1800 Christian, Rylee, MD   20 mg at 07/29/20 1840  . enoxaparin (LOVENOX) injection 40 mg  40 mg Subcutaneous Q24H Christian, Rylee, MD   40 mg at 07/29/20 1840  . folic acid (FOLVITE) tablet 1 mg  1 mg Oral Daily Gaylan Gerold, DO   1 mg at 07/30/20 0959  . insulin aspart (novoLOG) injection 0-15 Units  0-15 Units Subcutaneous TID WC Gaylan Gerold, DO   3 Units at 07/29/20 1841  . [START ON 07/31/2020] lactulose (CHRONULAC) 10 GM/15ML solution 5 g  5 g Oral Daily Gaylan Gerold, DO      . pantoprazole (PROTONIX) EC tablet 40 mg  40 mg Oral Daily Darrick Meigs, Rylee, MD   40 mg at 07/30/20 0959  . thiamine tablet 100 mg  100 mg Oral Daily Gaylan Gerold, DO  100 mg at 07/30/20 4128     Discharge Medications: Please see discharge summary for a list of discharge medications.  Relevant Imaging Results:  Relevant Lab Results:   Additional Information SSN: [id number]  Pollie Friar, RN

## 2020-07-30 NOTE — Discharge Summary (Addendum)
Name: Matthew Roman MRN: [id number] DOB: Jul 03, 1956 64 y.o. PCP: Bonnita Nasuti, MD  Date of Admission: 07/27/2020 10:57 AM Date of Discharge: 07/31/2020 Attending Physician: Sid Falcon, MD  Discharge Diagnosis: 1.  Acute hepatic encephalopathy 2.  Cirrhosis 3.  Hypertension  Discharge Medications: Allergies as of 07/30/2020   No Known Allergies     Medication List    TAKE these medications   amLODipine 5 MG tablet Commonly known as: NORVASC Take 5 mg by mouth daily.   aspirin 81 MG chewable tablet Chew 81 mg by mouth daily.   atorvastatin 20 MG tablet Commonly known as: LIPITOR Take 1 tablet (20 mg total) by mouth daily at 6 PM. What changed: how much to take   cloNIDine 0.1 MG tablet Commonly known as: CATAPRES Take 0.1 mg by mouth as needed. As needed is systolic greater than 379   lactulose 10 GM/15ML solution Commonly known as: CHRONULAC Take 7.5 mLs (5 g total) by mouth daily. Start taking on: July 31, 2020   metFORMIN 500 MG 24 hr tablet Commonly known as: GLUCOPHAGE-XR Take 500 mg by mouth 2 (two) times daily with a meal.   omeprazole 20 MG capsule Commonly known as: PRILOSEC Take 20 mg by mouth daily.   oxyCODONE 15 MG immediate release tablet Commonly known as: ROXICODONE Take 1 tablet (15 mg total) by mouth 3 (three) times daily. What changed:   when to take this  reasons to take this   ProAir HFA 108 (90 Base) MCG/ACT inhaler Generic drug: albuterol Inhale into the lungs every 6 (six) hours as needed for wheezing or shortness of breath.   Vitamin D 50 MCG (2000 UT) Caps Take 2,000 Units by mouth daily.       Disposition and follow-up:   Mr.Matthew Roman was discharged from [facility] in Stable condition.  At the hospital follow up visit please address:  1.    Hepatic encephalopathy Liver cirrhosis -Started lactulose 5 g once a day, titrate to 2-3 bowel movements per day -Follow-up on colon cancer and  liver cirrhosis  2.  Labs / imaging needed at time of follow-up: Possible repeat colonoscopy  3.  Pending labs/ test needing follow-up: N/A  Follow-up Appointments:  Follow-up Information    [place], Rosalyn Charters, MD. Schedule an appointment as soon as possible for a visit.   Specialty: Internal Medicine Contact information: [place] [address] [phone]        Gastroenterology, Sadie Haber. Schedule an appointment as soon as possible for a visit.   Why: If you do not remember your gastroenterologist, you can contact [facility] for colon cancer and cirrhosis follow-up. Contact information: [place] [address] [phone]        Care, [facility] Follow up.   Specialty: Home Health Services Why: The home health agency will contact you for the first home visit. Contact information: [address] [place] [address] [phone]               Hospital Course by problem list: 1.  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  He does have history of liver cirrhosis secondary to past alcohol use and treated HCV.  Ammonia however is within normal limits.  LFTs also within normal limits.  Brain MRI was  negative for any acute changes.  Fever resolved.  Patient was given lactulose and result significant improvement of mental status.  Lactulose was titrated to 2-3 bowel movements per day. Patient however complains of visual hallucination at night without any agitations.  Suspect to have underlying dementia or Wernicke/Korsakoff and sundowner syndrome.    Patient mental status is back to baseline on discharge.  He denies any visual hallucination of the night before.  Patient will be discharged home with home health PT/OT.  He will follow up with his PCP and  gastroenterologist for his colon cancer and liver cirrhosis.  Discharge Vitals:   BP 133/76 (BP Location: Left Arm)    Pulse 64    Temp 98 F (36.7 C) (Oral)    Resp 14    SpO2 99%   Pertinent Labs, Studies, and Procedures:  CBC Latest Ref Rng & Units 07/28/2020 07/27/2020 07/27/2020  WBC 4.0 - 10.5 K/uL 6.9 - 8.2  Hemoglobin 13.0 - 17.0 g/dL 13.5 13.9 14.6  Hematocrit 39 - 52 % 41.0 41.0 44.7  Platelets 150 - 400 K/uL 86(L) - 103(L)   CMP Latest Ref Rng & Units 07/30/2020 07/29/2020 07/28/2020  Glucose 70 - 99 mg/dL 127(H) 88 118(H)  BUN 8 - 23 mg/dL 11 9 14   Creatinine 0.61 - 1.24 mg/dL 0.77 0.87 0.88  Sodium 135 - 145 mmol/L 141 140 143  Potassium 3.5 - 5.1 mmol/L 3.9 3.1(L) 3.1(L)  Chloride 98 - 111 mmol/L 108 105 108  CO2 22 - 32 mmol/L 23 23 24   Calcium 8.9 - 10.3 mg/dL 8.9 9.1 8.9  Total Protein 6.5 - 8.1 g/dL - - -  Total Bilirubin 0.3 - 1.2 mg/dL - - -  Alkaline Phos 38 - 126 U/L - - -  AST 15 - 41 U/L - - -  ALT 0 - 44 U/L - - -   Ammonia: 27  CT Head Wo Contrast  Result Date: 07/27/2020 CLINICAL DATA:  Disoriented.  The no reported injuries. EXAM: CT HEAD WITHOUT CONTRAST TECHNIQUE: Contiguous axial images were obtained from the base of the skull through the vertex without intravenous contrast. COMPARISON:  Prior studies from November of 2020 and December of 2019 FINDINGS: Brain: Signs of remote infarcts in the LEFT basal ganglia and RIGHT thalamus and basal ganglia. Evidence of chronic microvascular ischemic change as before. No hydrocephalus, midline shift or intracranial hemorrhage. No extra-axial fluid collection. Vascular: No hyperdense vessel or unexpected calcification. Skull: Normal. Negative for fracture or focal lesion. Sinuses/Orbits: No acute finding. Other: None IMPRESSION: 1. No acute intracranial pathology. 2. Signs of prior infarct and chronic microvascular ischemic change as before. Electronically Signed   By: Zetta Bills M.D.   On: 07/27/2020 12:08   MR  BRAIN WO CONTRAST  Result Date: 07/27/2020 CLINICAL DATA:  Acute neuro deficit.  Rule out stroke. EXAM: MRI HEAD WITHOUT CONTRAST TECHNIQUE: Multiplanar, multiecho pulse sequences of the brain and surrounding structures were obtained without intravenous contrast. COMPARISON:  MRI head 09/03/2018 FINDINGS: Brain: The patient was not able to complete the study. Diffusion-weighted imaging and axial T2 weighted imaging were obtained which are diagnostic. The patient refused further scanning. Generalized atrophy unchanged. Negative for hydrocephalus. Chronic microvascular ischemic change in the white matter. Chronic infarct in the left basal ganglia and deep white matter tracts. Chronic infarcts in the thalamus bilaterally. Wallerian degeneration in the cortical spinal tracts on the left. Negative for acute infarct.  Negative for mass or fluid collection Vascular: Normal arterial flow voids.  Skull and upper cervical spine: Negative Sinuses/Orbits: Mild mucosal edema paranasal sinuses. Negative orbit Other: None IMPRESSION: Limited study.  The patient refused to complete the study. Negative for acute infarct Atrophy and chronic ischemic changes stable since 2019 MRI Electronically Signed   By: Franchot Gallo M.D.   On: 07/27/2020 21:08   DG Chest Portable 1 View  Result Date: 07/27/2020 CLINICAL DATA:  Altered mental status. EXAM: PORTABLE CHEST 1 VIEW COMPARISON:  09/02/2018 FINDINGS: The heart size and mediastinal contours are within normal limits. Calcific atherosclerosis of the aorta. Both lungs are clear. Right costophrenic sulcus is not imaged. No visible pleural effusions or pneumothorax. The visualized skeletal structures are unremarkable. IMPRESSION: No acute cardiopulmonary disease. Electronically Signed   By: Margaretha Sheffield MD   On: 07/27/2020 11:36   US Abdomen Limited RUQ (LIVER/GB)  Result Date: 07/27/2020 CLINICAL DATA:  Follow-up cirrhosis EXAM: ULTRASOUND ABDOMEN LIMITED RIGHT UPPER QUADRANT  COMPARISON:  08/31/2011 FINDINGS: Gallbladder: Surgically removed Common bile duct: Diameter: 2.8 mm Liver: Nodularity is noted with heterogeneity in the echo pattern. This is consistent with the given clinical history of cirrhosis. No mass lesion is noted. The portal vein is prominent measuring almost 14 mm but patent on color Doppler imaging with normal direction of blood flow towards the liver. Other: None. IMPRESSION: Changes of cirrhosis of the liver. Prominent portal vein which appears similar to that seen on prior CT examination. Status post cholecystectomy. Electronically Signed   By: Inez Catalina M.D.   On: 07/27/2020 18:16   Discharge Instructions: Discharge Instructions    Call MD for:  persistant nausea and vomiting   Complete by: As directed    Call MD for:  severe uncontrolled pain   Complete by: As directed    Diet - low sodium heart healthy   Complete by: As directed    Discharge instructions   Complete by: As directed    Mr. Andrepont, it is a pleasure taking care of you during this admission.  You were admitted for hepatic encephalopathy and improved with lactulose.  -Please start taking lactulose 5 g once a day, the goal is having 2-3 bowel movements a day. -Please follow-up with your gastroenterologist for colon cancer and liver cirrhosis.  If you cannot remember who is your gastroenterologist, you can contact [facility] to set up a follow-up appointment. -Please follow-up with your primary care physician.  Take care   Increase activity slowly   Complete by: As directed       Signed: Gaylan Gerold, DO 07/30/2020, 1:55 PM   Pager: [phone]

## 2020-07-30 NOTE — Progress Notes (Signed)
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 MRI neg for acute changes.    PT Comments    Pt is demonstrating progress towards his goals as he was able to navigate 4 steps this date with 1 UE support on rail or 1 HHA without LOB. He was able to navigate the stairs with a reciprocal gait pattern but required increased assistance from min guard with use of a rail to [facility] when only provided 1 HHA. He is able to ambulate with the RW without LOB, but requires min guard for safety and to remind pt to remain within his RW as he will place his R LE lateral to the RW on occasion. He also demonstrates difficulty safely managing the RW in tight spaces. After gait training, focused remaining of session on LE strengthening, with the pt displaying and reporting fatigue with each exercise. Will continue to follow acutely and recommend [facility] PT upon d/c to address his deficits to maximize his independence and safety with all functional mobility.    Follow Up Recommendations  Home health PT     Equipment Recommendations  None recommended by PT    Recommendations for Other Services       Precautions / Restrictions Precautions Precautions: Fall Precaution Comments: fell prior to admission, reports that he passed out Restrictions Weight Bearing Restrictions: No    Mobility  Bed Mobility Overal bed mobility: Needs Assistance Bed Mobility: Supine to Sit     Supine to sit: Supervision;HOB elevated      General bed mobility comments: increased time and effort, HOB slightly elevated, no physical assist required, supervision for safety  Transfers Overall transfer level: Needs assistance Equipment used: Rolling walker (2 wheeled) Transfers: Sit to/from Stand Sit to Stand: Min guard         General transfer comment: Cues for hand placement on bed rather than RW to come to stand, min guard for safety and balance.  Ambulation/Gait Ambulation/Gait assistance: Min guard Gait Distance (Feet): 200 Feet Assistive device: Rolling walker (2 wheeled) Gait Pattern/deviations: Step-through pattern;Decreased step length - right Gait velocity: decreased Gait velocity interpretation: <1.8 ft/sec, indicate of risk for recurrent falls General Gait Details: Pt ambulates at slow pace, but can inc speed momentarily when cued. Pt demonstrated ability to navigate around obstacles with RW but difficulty when in tight spaces as he would bump obstacles with RW. Cued pt to remain within RW as he tends to place R LE laterally to RW intermittently.   Stairs Stairs: Yes Stairs assistance: Min guard;Min assist Stair Management: One rail Right;Alternating pattern Number of Stairs: 4 General stair comments: Acsending and descending steps slowly with reciprocal gait pattern, utilizing R rail ascending and L descending initial 2 steps with min guard assist. Transitioned to R HHA for final 2 steps, minA to maintain balance. No overt LOB.    Wheelchair Mobility    Modified Rankin (Stroke Patients Only)       Balance Overall balance assessment: Needs assistance Sitting-balance support: Feet supported Sitting balance-Leahy Scale: Good Sitting balance - Comments:  Static sitting EOB no LOB with SUP.   Standing balance support: Bilateral upper extremity supported;During functional activity;No upper extremity supported Standing balance-Leahy Scale: Fair Standing balance comment: Min guard for safety, no LOB  noted, utilizing B UEs on RW and intermittent no UE support.                            Cognition Arousal/Alertness: Awake/alert Behavior During Therapy: WFL for tasks assessed/performed Overall Cognitive Status: Impaired/Different from baseline Area of Impairment: Memory;Safety/judgement;Problem solving;Following commands                     Memory: Decreased short-term memory Following Commands: Follows one step commands consistently;Follows one step commands with increased time;Follows multi-step commands inconsistently Safety/Judgement: Decreased awareness of safety;Decreased awareness of deficits   Problem Solving: Difficulty sequencing;Requires verbal cues;Slow processing General Comments: Pt displays slow processing and decreased STM in regards to cues to maintain safety. required extra time to process and respond to more than single step commands.      Exercises General Exercises - Lower Extremity Hip Flexion/Marching: Strengthening;Both;10 reps;Seated Heel Raises: Strengthening;Both;10 reps;Standing (with B HHA) Other Exercises Other Exercises: STS x10 reps from bedside chair, with UE support first 2 reps then no UE support final 8 reps    General Comments        Pertinent Vitals/Pain Pain Assessment: 0-10 Pain Score: 9  Pain Location: abdomen Pain Descriptors / Indicators: Discomfort Pain Intervention(s): Monitored during session;Limited activity within patient's tolerance;Patient requesting pain meds-RN notified    Home Living                      Prior Function            PT Goals (current goals can now be found in the care plan section) Acute Rehab PT Goals Patient Stated Goal: "to get back to normal" PT Goal Formulation: With patient Time For Goal Achievement: 08/12/20 Potential to Achieve Goals: Good Progress towards PT goals: Progressing toward goals    Frequency    Min 3X/week      PT Plan Current plan remains  appropriate    Co-evaluation              AM-PAC PT "6 Clicks" Mobility   Outcome Measure  Help needed turning from your back to your side while in a flat bed without using bedrails?: None Help needed moving from lying on your back to sitting on the side of a flat bed without using bedrails?: None Help needed moving to and from a bed to a chair (including a wheelchair)?: A Little Help needed standing up from a chair using your arms (e.g., wheelchair or bedside chair)?: A Little Help needed to walk in hospital room?: A Little Help needed climbing 3-5 steps with a railing? : A Little 6 Click Score: 20    End of Session Equipment Utilized During Treatment: Gait belt Activity Tolerance: Patient tolerated treatment well Patient left: in chair;with call bell/phone within reach;with chair alarm set Nurse Communication: Mobility status;Other (comment) (pain) PT Visit Diagnosis: Unsteadiness on feet (R26.81);Other abnormalities of gait and mobility (R26.89);Muscle weakness (generalized) (M62.81)     Time: 2778-2423 PT Time Calculation (min) (ACUTE ONLY): 23 min  Charges:  $Gait Training: 8-22 mins $Therapeutic Exercise: 8-22 mins                     Moishe Spice, PT, DPT Acute Rehabilitation  Services  Pager: [phone] Office: [place] 07/30/2020, 10:43 AM

## 2020-07-30 NOTE — Progress Notes (Deleted)
Name: Matthew Roman MRN: [id number] DOB: 10/25/1955 64 y.o. PCP: Bonnita Nasuti, MD  Date of Admission: 07/27/2020 10:57 AM Date of Discharge:  Attending Physician: Sid Falcon, MD  Discharge Diagnosis: 1.  Acute hepatic encephalopathy 2.  Cirrhosis 3.  Hypertension  Discharge Medications: Allergies as of 07/30/2020   No Known Allergies     Medication List    TAKE these medications   amLODipine 5 MG tablet Commonly known as: NORVASC Take 5 mg by mouth daily.   aspirin 81 MG chewable tablet Chew 81 mg by mouth daily.   atorvastatin 20 MG tablet Commonly known as: LIPITOR Take 1 tablet (20 mg total) by mouth daily at 6 PM. What changed: how much to take   cloNIDine 0.1 MG tablet Commonly known as: CATAPRES Take 0.1 mg by mouth as needed. As needed is systolic greater than 177   lactulose 10 GM/15ML solution Commonly known as: CHRONULAC Take 7.5 mLs (5 g total) by mouth daily. Start taking on: July 31, 2020   metFORMIN 500 MG 24 hr tablet Commonly known as: GLUCOPHAGE-XR Take 500 mg by mouth 2 (two) times daily with a meal.   omeprazole 20 MG capsule Commonly known as: PRILOSEC Take 20 mg by mouth daily.   oxyCODONE 15 MG immediate release tablet Commonly known as: ROXICODONE Take 1 tablet (15 mg total) by mouth 3 (three) times daily. What changed:   when to take this  reasons to take this   ProAir HFA 108 (90 Base) MCG/ACT inhaler Generic drug: albuterol Inhale into the lungs every 6 (six) hours as needed for wheezing or shortness of breath.   Vitamin D 50 MCG (2000 UT) Caps Take 2,000 Units by mouth daily.       Disposition and follow-up:   Matthew Roman was discharged from [facility] in Stable condition.  At the hospital follow up visit please address:  1.    Hepatic encephalopathy Liver cirrhosis -Started lactulose 5 g once a day, titrate to 2-3 bowel movements per day -Follow-up on colon cancer and liver  cirrhosis  2.  Labs / imaging needed at time of follow-up: Possible repeat colonoscopy  3.  Pending labs/ test needing follow-up: N/A  Follow-up Appointments:  Follow-up Information    [place], Rosalyn Charters, MD. Schedule an appointment as soon as possible for a visit.   Specialty: Internal Medicine Contact information: [place] [address] [phone]        Gastroenterology, Sadie Haber. Schedule an appointment as soon as possible for a visit.   Why: If you do not remember your gastroenterologist, you can contact [facility] for colon cancer and cirrhosis follow-up. Contact information: [place] [address] [phone]               Hospital Course by problem list: 1.  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  He does have history of liver cirrhosis secondary to past alcohol use and treated HCV.  Ammonia however is within normal limits.  LFTs also within normal limits.  Brain MRI was negative for any acute changes.  Fever resolved.  Patient was given lactulose and result significant improvement of mental status.  Lactulose was titrated to 2-3 bowel movements per day. Patient however complains of visual hallucination at night without any agitations.  Suspect  to have underlying dementia or Wernicke/Korsakoff and sundowner syndrome.    Patient mental status is back to baseline on discharge.  He denies any visual hallucination of the night before.  Patient will be discharged home with home health PT/OT.  He will follow up with his PCP and gastroenterologist for his colon cancer and liver cirrhosis.  Discharge Vitals:   BP 120/74 (BP Location: Left Arm)   Pulse (!) 59   Temp 97.9 F (36.6 C) (Oral)   Resp 16   SpO2 99%   Pertinent Labs, Studies, and Procedures:  CBC Latest  Ref Rng & Units 07/28/2020 07/27/2020 07/27/2020  WBC 4.0 - 10.5 K/uL 6.9 - 8.2  Hemoglobin 13.0 - 17.0 g/dL 13.5 13.9 14.6  Hematocrit 39 - 52 % 41.0 41.0 44.7  Platelets 150 - 400 K/uL 86(L) - 103(L)   CMP Latest Ref Rng & Units 07/30/2020 07/29/2020 07/28/2020  Glucose 70 - 99 mg/dL 127(H) 88 118(H)  BUN 8 - 23 mg/dL 11 9 14   Creatinine 0.61 - 1.24 mg/dL 0.77 0.87 0.88  Sodium 135 - 145 mmol/L 141 140 143  Potassium 3.5 - 5.1 mmol/L 3.9 3.1(L) 3.1(L)  Chloride 98 - 111 mmol/L 108 105 108  CO2 22 - 32 mmol/L 23 23 24   Calcium 8.9 - 10.3 mg/dL 8.9 9.1 8.9  Total Protein 6.5 - 8.1 g/dL - - -  Total Bilirubin 0.3 - 1.2 mg/dL - - -  Alkaline Phos 38 - 126 U/L - - -  AST 15 - 41 U/L - - -  ALT 0 - 44 U/L - - -   Ammonia: 27  CT Head Wo Contrast  Result Date: 07/27/2020 CLINICAL DATA:  Disoriented.  The no reported injuries. EXAM: CT HEAD WITHOUT CONTRAST TECHNIQUE: Contiguous axial images were obtained from the base of the skull through the vertex without intravenous contrast. COMPARISON:  Prior studies from November of 2020 and December of 2019 FINDINGS: Brain: Signs of remote infarcts in the LEFT basal ganglia and RIGHT thalamus and basal ganglia. Evidence of chronic microvascular ischemic change as before. No hydrocephalus, midline shift or intracranial hemorrhage. No extra-axial fluid collection. Vascular: No hyperdense vessel or unexpected calcification. Skull: Normal. Negative for fracture or focal lesion. Sinuses/Orbits: No acute finding. Other: None IMPRESSION: 1. No acute intracranial pathology. 2. Signs of prior infarct and chronic microvascular ischemic change as before. Electronically Signed   By: Zetta Bills M.D.   On: 07/27/2020 12:08   MR BRAIN WO CONTRAST  Result Date: 07/27/2020 CLINICAL DATA:  Acute neuro deficit.  Rule out stroke. EXAM: MRI HEAD WITHOUT CONTRAST TECHNIQUE: Multiplanar, multiecho pulse sequences of the brain and surrounding structures were obtained without  intravenous contrast. COMPARISON:  MRI head 09/03/2018 FINDINGS: Brain: The patient was not able to complete the study. Diffusion-weighted imaging and axial T2 weighted imaging were obtained which are diagnostic. The patient refused further scanning. Generalized atrophy unchanged. Negative for hydrocephalus. Chronic microvascular ischemic change in the white matter. Chronic infarct in the left basal ganglia and deep white matter tracts. Chronic infarcts in the thalamus bilaterally. Wallerian degeneration in the cortical spinal tracts on the left. Negative for acute infarct.  Negative for mass or fluid collection Vascular: Normal arterial flow voids. Skull and upper cervical spine: Negative Sinuses/Orbits: Mild mucosal edema paranasal sinuses. Negative orbit Other: None IMPRESSION: Limited study.  The patient refused to complete the study. Negative for acute infarct Atrophy and chronic ischemic changes stable since 2019 MRI Electronically Signed   By: Juanda Crumble  Carlis Abbott M.D.   On: 07/27/2020 21:08   DG Chest Portable 1 View  Result Date: 07/27/2020 CLINICAL DATA:  Altered mental status. EXAM: PORTABLE CHEST 1 VIEW COMPARISON:  09/02/2018 FINDINGS: The heart size and mediastinal contours are within normal limits. Calcific atherosclerosis of the aorta. Both lungs are clear. Right costophrenic sulcus is not imaged. No visible pleural effusions or pneumothorax. The visualized skeletal structures are unremarkable. IMPRESSION: No acute cardiopulmonary disease. Electronically Signed   By: Margaretha Sheffield MD   On: 07/27/2020 11:36   US Abdomen Limited RUQ (LIVER/GB)  Result Date: 07/27/2020 CLINICAL DATA:  Follow-up cirrhosis EXAM: ULTRASOUND ABDOMEN LIMITED RIGHT UPPER QUADRANT COMPARISON:  08/31/2011 FINDINGS: Gallbladder: Surgically removed Common bile duct: Diameter: 2.8 mm Liver: Nodularity is noted with heterogeneity in the echo pattern. This is consistent with the given clinical history of cirrhosis. No mass  lesion is noted. The portal vein is prominent measuring almost 14 mm but patent on color Doppler imaging with normal direction of blood flow towards the liver. Other: None. IMPRESSION: Changes of cirrhosis of the liver. Prominent portal vein which appears similar to that seen on prior CT examination. Status post cholecystectomy. Electronically Signed   By: Inez Catalina M.D.   On: 07/27/2020 18:16   Discharge Instructions: Discharge Instructions    Call MD for:  persistant nausea and vomiting   Complete by: As directed    Call MD for:  severe uncontrolled pain   Complete by: As directed    Diet - low sodium heart healthy   Complete by: As directed    Discharge instructions   Complete by: As directed    Matthew Roman, it is a pleasure taking care of you during this admission.  You were admitted for hepatic encephalopathy and improved with lactulose.  -Please start taking lactulose 5 g once a day, the goal is having 2-3 bowel movements a day. -Please follow-up with your gastroenterologist for colon cancer and liver cirrhosis.  If you cannot remember who is your gastroenterologist, you can contact [facility] to set up a follow-up appointment. -Please follow-up with your primary care physician.  Take care   Increase activity slowly   Complete by: As directed       Signed: Gaylan Gerold, DO 07/30/2020, 11:20 AM   Pager: [phone]

## 2020-07-30 NOTE — Progress Notes (Signed)
Occupational Therapy Treatment Patient Details Name: Matthew Roman MRN: [id number] DOB: [birth year] Today's Date: 07/30/2020    History of present illness 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 MRI neg for acute changes.   OT comments  Pt making steady progress towards OT goals this session. Pt impulsively exiting bed upon OTA arrival with no awareness to safety implications. Pt states " I need to go down the hall to the office." Pt was oriented to location and eventually able to problem solve that was wanting to speak to his RN. Pt continues to present with decreased safety awareness, impaired balance, cognitive deficits ( see cog section) and impaired ability to care for self impacting pts ability to complete BADLs independently. Overall, pt requires gross supervision for household distance functional mobility with no AD, noted LOB during simulated tub shower transfer needing up to MIN A to correct LOB. Pt able to complete simulated laundry task where pt was instructed to gather ADL items around room from all surface heights with min guard- supervision for mobility and no LOB noted when pt reaching out of BOS to gather items. Pt with inconsistent reports of supervision available at home but feel pt needs 24 hour supervision for safety awareness. Will continue to follow acutely per POC.     Follow Up Recommendations  Home health OT;Supervision/Assistance - 24 hour    Equipment Recommendations  3 in 1 bedside commode    Recommendations for Other Services      Precautions / Restrictions Precautions Precautions: Fall Precaution Comments:  fell prior to admission, reports that he passed out Restrictions Weight Bearing Restrictions: No       Mobility Bed Mobility Overal bed mobility: Modified Independent Bed Mobility: Supine to Sit;Sit to Supine     Supine to sit: Modified independent (Device/Increase time) Sit to supine: Modified independent (Device/Increase time)   General bed mobility comments: no assist needed as pt impulsively getting OOB upon arrival with use of rails and elevated HOB  Transfers Overall transfer level: Needs assistance Equipment used: None Transfers: Sit to/from Stand Sit to Stand: Supervision         General transfer comment: gross supervision for safety    Balance Overall balance assessment: Needs assistance Sitting-balance support: Feet supported Sitting balance-Leahy Scale: Good Sitting balance - Comments: Static sitting EOB no LOB with SUP.   Standing balance support: No upper extremity supported;During functional activity Standing balance-Leahy Scale: Poor Standing balance comment: min guard for safety and up to MIN A for functional task such as simulated tub shower transfer, pt with LOB during transfer. pt was able to gather ADL items from floor with min guard and no LOB                           ADL either performed or assessed with clinical judgement   ADL Overall ADL's : Needs assistance/impaired     Grooming: Wash/dry hands;Standing;Supervision/safety Grooming Details (indicate cue type and reason): gross supervision at sink for safety                 Toilet Transfer: Min guard;Ambulation Toilet Transfer Details (indicate cue  type and reason): pt stood to urinate with minguard for safety with ambulation Toileting- Clothing Manipulation and Hygiene: Supervision/safety;Sit to/from stand Toileting - Clothing Manipulation Details (indicate cue type and reason): pt able to manage underwear to urinate with gross supervision Tub/ Shower Transfer: Tub  transfer;Minimal assistance Tub/Shower Transfer Details (indicate cue type and reason): simulated stepping over tub with pt needing MIN A and noted LOB needing MIN A to self correct Functional mobility during ADLs: Min guard General ADL Comments: pt present with decreased safety awareness, impaired balance and impaired ability to care for self     Vision       Perception     Praxis      Cognition Arousal/Alertness: Awake/alert Behavior During Therapy: Impulsive (getting out of bed upon arrival) Overall Cognitive Status: Impaired/Different from baseline Area of Impairment: Memory;Safety/judgement;Problem solving;Following commands;Orientation                 Orientation Level: Situation (denies falling at home but later reports in session that he "passed out")   Memory: Decreased short-term memory Following Commands: Follows one step commands consistently;Follows multi-step commands with increased time Safety/Judgement: Decreased awareness of safety;Decreased awareness of deficits (noted to set off chair alarm once upon OTA entry and later as OTA was passing by room despite edcucation on not getting up without assist)   Problem Solving: Difficulty sequencing;Requires verbal cues;Slow processing General Comments: pt alert and oriented to day of week and month, does seem aware of deficits in relation to balance and activity tolerance. noted decreased safety awareness in relation to mobility, pt with some STM deficits as pt unable to recall education provided in relation to safety at start of session. pt reprots he does cook at home and complete IADLs and agrees with therapist when OTA mentions safety concerns in relation to cooking tasks. education provided on low tech compensatory methods for safety awarness in kitchen. pt provided inconsistent PLOF as pt originally states he has no children but later refers to "Matthew Roman" as his daughter. pt gives vague family support originally stating  that he lives alone but then back tracks to stating that someone comes home around 9 pm which seeems to be his brother in law- later confirmed that he does live with his sister.        Exercises  Other Exercises Other Exercises: pt reports numbness in tingling in R hand alhtough sensation appears intact. pt BUE AROM WFL   Shoulder Instructions       General Comments pt able to complete IADL task of simulated laundry with pt able to retrieve wash cloths from around room and stand to fold items and put items on shelf, no LOB during task. Pt reports he does drive, cook and clean at home, expressed concerns of pt being alone and completing IADL tasks with pts decreased safety awareness, pt receptive to education related to compensatory methods for memory deficits    Pertinent Vitals/ Pain       Pain Assessment: No/denies pain Pain Score: 9  Pain Location: abdomen Pain Descriptors / Indicators: Discomfort Pain Intervention(s): Monitored during session;Limited activity within patient's tolerance;Patient requesting pain meds-RN notified  Home Living                                          Prior Functioning/Environment              Frequency  Min 2X/week        Progress Toward Goals  OT Goals(current goals can now be found in the care plan section)  Progress towards OT goals: Progressing toward goals  Acute Rehab OT Goals Patient Stated Goal: "to get back to normal" OT Goal Formulation: With patient Time For Goal Achievement: 08/12/20 Potential to Achieve Goals: Good  Plan Discharge plan remains appropriate;Frequency remains appropriate    Co-evaluation                 AM-PAC OT "6 Clicks" Daily Activity     Outcome Measure   Help from another person eating meals?: None Help from another person taking care of personal grooming?: A Little Help from another person toileting, which includes using toliet, bedpan, or urinal?: A Little Help from  another person bathing (including washing, rinsing, drying)?: A Little Help from another person to put on and taking off regular upper body clothing?: A Little Help from another person to put on and taking off regular lower body clothing?: A Little 6 Click Score: 19    End of Session Equipment Utilized During Treatment: Gait belt  OT Visit Diagnosis: Unsteadiness on feet (R26.81);Other symptoms and signs involving cognitive function   Activity Tolerance Patient tolerated treatment well   Patient Left in chair;with call bell/phone within reach;with chair alarm set   Nurse Communication Mobility status        Time: 4166-0630 OT Time Calculation (min): 17 min  Charges: OT General Charges $OT Visit: 1 Visit OT Treatments $Self Care/Home Management : 8-22 mins  Lanier Clam., COTA/L Acute Rehabilitation Services [phone]    Ihor Gully 07/30/2020, 11:55 AM

## 2020-07-30 NOTE — TOC Initial Note (Signed)
Transition of Care [facility]) - Initial/Assessment Note    Patient Details  Name: Matthew Roman MRN: [id number] Date of Birth: 1956/06/12  [facility] CM/SW Contact:    Pollie Friar, RN Phone Number: 07/30/2020, 1:03 PM  Clinical Narrative:                 Pt lives at home with his daughter: Nira Conn. CM met with the patient and reached out to his daughter with permission. She states the pt will not have needed supervision at home and asked for SNF rehab prior to home. CM updated her that the insurance may not cover SNF rehab but that we can submit and see if they will approve.  Heather asked to have him faxed out in [facility].  MD updated.  TOC updated.  Expected Discharge Plan: Skilled Nursing Facility Barriers to Discharge: SNF Pending bed offer, Insurance Authorization   Patient Goals and CMS Choice   CMS Medicare.gov Compare Post Acute Care list provided to:: Patient Represenative (must comment) Choice offered to / list presented to : Adult Children  Expected Discharge Plan and Services Expected Discharge Plan: [place] In-house Referral: Clinical Social Work Discharge Planning Services: CM Consult Post Acute Care Choice: [place] arrangements for the past 2 months: Agency Expected Discharge Date: 07/30/20                                    Prior Living Arrangements/Services Living arrangements for the past 2 months: Single Family Home Lives with:: Adult Children Patient language and need for interpreter reviewed:: Yes Do you feel safe going back to the place where you live?: Yes      Need for Family Participation in Patient Care: Yes (Comment) Care giver support system in place?: No (comment)   Criminal Activity/Legal Involvement Pertinent to Current Situation/Hospitalization: No - Comment as needed  Activities of Daily Living      Permission Sought/Granted                   Emotional Assessment Appearance:: Appears stated age Attitude/Demeanor/Rapport: Engaged Affect (typically observed): Accepting Orientation: : Oriented to Self, Oriented to Place   Psych Involvement: No (comment)  Admission diagnosis:  Delirium, acute [R41.0] History of cirrhosis [Z87.19] Acute encephalopathy [G93.40] Patient Active Problem List   Diagnosis Date Noted  . Essential hypertension 07/29/2020  . Hypokalemia 07/29/2020  . Encephalopathy, hepatic ([place]) 07/29/2020  . DM II (diabetes mellitus, type II), controlled ([place]) 07/29/2020  . Visual hallucinations 07/29/2020  . History of cirrhosis   . Acute encephalopathy 09/02/2018  . Multiple lacunar infarcts ([place]) 09/02/2018  . Fracture of femoral neck, right, closed ([place]) 09/02/2018  . TOBACCO DEPENDENCE 11/19/2006  . BACK PAIN, LOW 11/19/2006   PCP:  Bonnita Nasuti, MD Pharmacy:   [facility] [address] [place] [address] Phone: [phone] Fax: [phone]     Social Determinants of Health (SDOH) Interventions    Readmission Risk Interventions No flowsheet data found.

## 2020-07-31 LAB — RESPIRATORY PANEL BY RT PCR (FLU A&B, COVID)
Influenza A by PCR: NEGATIVE
Influenza B by PCR: NEGATIVE
SARS Coronavirus 2 by RT PCR: NEGATIVE

## 2020-07-31 LAB — GLUCOSE, CAPILLARY
Glucose-Capillary: 137 mg/dL — ABNORMAL HIGH (ref 70–99)
Glucose-Capillary: 203 mg/dL — ABNORMAL HIGH (ref 70–99)

## 2020-07-31 MED ORDER — OXYCODONE HCL 15 MG PO TABS: 15.0000 mg | ORAL_TABLET | Freq: Three times a day (TID) | ORAL | 0 refills | Status: DC | PRN

## 2020-07-31 NOTE — Plan of Care (Signed)

## 2020-07-31 NOTE — TOC Transition Note (Signed)
Transition of Care [facility]) - CM/SW Discharge Note   Patient Details  Name: Matthew Roman MRN: [id number] Date of Birth: 04-May-1956  Transition of Care [facility]) CM/SW Contact:  Pollie Friar, RN Phone Number: 07/31/2020, 11:43 AM   Clinical Narrative:    Pt is discharging to [facility] today. Pt and Granddaughter are aware he will be on isolation at the facility d/t no covid vaccines. Pt transporting via [facility]. Bedside RN updated and d/c packet at the desk.  Room; [address]B Number for report: [phone]   Final next level of care: Skilled Nursing Facility Barriers to Discharge: No Barriers Identified   Patient Goals and CMS Choice   CMS Medicare.gov Compare Post Acute Care list provided to:: Patient Represenative (must comment) Choice offered to / list presented to : Adult Children  Discharge Placement PASRR number recieved: 07/30/20            Patient chooses bed at: [place] [place] Patient to be transferred to facility by: [place] Name of family member notified: [place] Patient and family notified of of transfer: 07/31/20  Discharge Plan and Services In-house Referral: Clinical Social Work Discharge Planning Services: [employer] Consult Post Acute Care Choice: [place]                               Social Determinants of Health (SDOH) Interventions     Readmission Risk Interventions No flowsheet data found.

## 2020-07-31 NOTE — Care Management Important Message (Signed)
Important Message  Patient Details  Name: Matthew Roman MRN: [id number] Date of Birth: [birth year]   Medicare Important Message Given:  Yes     Jeramyah Goodpasture Montine Circle 07/31/2020, 1:38 PM

## 2020-07-31 NOTE — Plan of Care (Signed)
  Problem: Activity: Goal: Risk for activity intolerance will decrease Outcome: Progressing   Problem: Nutrition: Goal: Adequate nutrition will be maintained Outcome: Progressing   Problem: Education: Goal: Knowledge of secondary prevention will improve Outcome: Progressing

## 2020-07-31 NOTE — Progress Notes (Signed)
Subjective:   Hospital day: 3  Overnight event: No event overnight  This morning, he reports that he feels well and back to his baseline. He reports having six to seven bowel movements yesterday. He states that he is ready to go to the skilled nursing facility. He denies recent hallucinations. Otherwise, he has no further questions or concerns.  Objective:  Vital signs in last 24 hours: Vitals:   07/30/20 1531 07/30/20 1933 07/30/20 2321 07/31/20 0332  BP: 119/86 (!) 144/78 (!) 152/80 (!) 162/92  Pulse: 60 65 65 66  Resp: 14 18 18 18   Temp: 98.3 F (36.8 C) 98.2 F (36.8 C) 98.3 F (36.8 C) 98.1 F (36.7 C)  TempSrc: Oral Oral Oral Oral  SpO2: 98% 99% 99% 98%    Physical Exam  Physical Exam Constitutional:      General: He is not in acute distress.    Appearance: Normal appearance. He is not toxic-appearing.  HENT:     Head: Normocephalic.  Eyes:     General:        Right eye: No discharge.        Left eye: No discharge.  Cardiovascular:     Rate and Rhythm: Normal rate and regular rhythm.  Pulmonary:     Effort: No respiratory distress.     Breath sounds: Normal breath sounds.  Abdominal:     General: There is distension.     Palpations: Abdomen is soft.     Tenderness: There is abdominal tenderness (Mild tenderness to palpation of epigastric).  Musculoskeletal:     Right lower leg: No edema.     Left lower leg: No edema.  Neurological:     Mental Status: He is alert.  Psychiatric:        Mood and Affect: Mood normal.     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 He does have history of liver cirrhosis secondary to past alcohol use and treated HCV. Ammonia however is within normal limits. LFTs also within normal limits. Brain MRI was negative for any acute changes. Fever resolved. Patient was given lactulose and result significant improvement of mental status.  His mental status is back to baseline.  Patient will be discharged home to a SNF for 24-hour supervision.  Will follow up with his primary care physician and GI for follow-up.  Patient states that he had 6 bowel vomit yesterday however RN states that she did not receive the report from previous shift about multiple bowel movements. -Lactulose 5 g once a day, titrate to 2-3 bowel moments per day -SNF placement -Follow-up with GI outpatient for repeat colonoscopy and follow-up for cirrhosis   Epigastric abdominal pain Likely due to acid reflux.  Lipase within normal limits, low suspicion for pancreatitis.   -Continue PPI -  Maalox as needed    Hypertension Amlodipine to 10 mg a day   Diabetes mellitus Patient is on Metformin.Sliding scale insulin when patient starts diet   Diet:Heart healthy IVF:No NZU:DODQVHQ CODE:Full  Prior to Admission Living Arrangement:Home Anticipated Discharge Location: SNF Barriers to Discharge:  No Dispo: Anticipated discharge in approximately  today  Gaylan Gerold, DO 07/31/2020, 6:16 AM Pager: [phone] After 5pm on weekdays and 1pm on weekends: On Call pager [phone]

## 2020-07-31 NOTE — Progress Notes (Signed)
Discharge instructions were reviewed with pt; voiced understanding and copy sent with pt. Personal belongings were packed and sent with pt. Report was called and given to [place], LPN at [facility]. Pt. transported via stretcher by [facility] to receiving facility.

## 2020-07-31 NOTE — Progress Notes (Addendum)
Occupational Therapy Treatment Patient Details Name: Matthew Roman MRN: [id number] DOB: 1956-06-20 Today's Date: 07/31/2020    History of present illness [name] is a 64 year old man with past medical history of HTN, HLD, T2DM, COPD, current tobacco use, cirrhosis, prior CVA (multiple right MCA/ACA infarcts in December of 2019 with residual deficits of mild confusion, mixed dysarthria and expressive aphasia), and colorectal carcinoma (dx in 2018, but no treatment) who presented to [facility] on 07/27/2020 with AMS in setting of recent GI illness. MRI neg for acute changes.   OT comments  Pt making steady progress towards OT goals this session. Session focus on administration of pillbox assessment to facilitate increased anticipatory awareness during ADLs and during functional tasks. Pt made >3 errors during allotted time to complete assessment leading to a failing score. Pt required cues to orient pt to mistakes during assessment, however once pt oriented to errors pt did acknowledge errors made. Noted family requesting SNF placement as family cannot provide 24/7 assist. Feel this is an appropriate recommendation d/t lack of family assist, updated DC recs below to reflect change in DC plan, will let OTR know about change in POC. Pt would continue to benefit from skilled occupational therapy while admitted and after d/c to address the below listed limitations in order to improve overall functional mobility and facilitate independence with BADL participation.  Will follow acutely per POC.     Follow Up Recommendations  Home health OT;Supervision/Assistance - 24 hour;SNF    Equipment Recommendations  3 in 1 bedside commode    Recommendations for Other Services      Precautions / Restrictions Precautions Precautions: Fall Precaution Comments: fell prior to admission, reports that he passed out Restrictions Weight Bearing Restrictions: No       Mobility Bed Mobility Overal bed  mobility: Modified Independent Bed Mobility: Supine to Sit;Sit to Supine     Supine to sit: Modified independent (Device/Increase time)     General bed mobility comments: no assist needed with use of rails and elevated HOB  Transfers Overall transfer level: Needs assistance Equipment used: None Transfers: Sit to/from Stand Sit to Stand: Supervision         General transfer comment: gross supervision for safety    Balance Overall balance assessment: Needs assistance Sitting-balance support: Feet supported Sitting balance-Leahy Scale: Good Sitting balance - Comments: able to reach to BLEs to don shoes   Standing balance support: No upper extremity supported;During functional activity Standing balance-Leahy Scale: Fair Standing balance comment: pt ablel to complete UB ADLs while standingn at sink with no UE support or LOB                           ADL either performed or assessed with clinical judgement   ADL Overall ADL's : Needs assistance/impaired     Grooming: Wash/dry hands;Standing;Supervision/safety Grooming Details (indicate cue type and reason): gross supervision at sink for safety             Lower Body Dressing: Sit to/from stand;Set up Lower Body Dressing Details (indicate cue type and reason): able to don shoes from EOB with s/u assist Toilet Transfer: Supervision/safety;Ambulation Toilet Transfer Details (indicate cue type and reason): pt stood to urinate with gross supervision for safety with ambulation Toileting- Clothing Manipulation and Hygiene: Supervision/safety;Sit to/from stand Toileting - Clothing Manipulation Details (indicate cue type and reason): pt able to manage underwear to urinate with gross supervision     Functional mobility during  ADLs: Supervision/safety General ADL Comments: session focus on administration of pillbox assessment and continued work on Dietitian via functional moblity and BADLs     Vision        Perception     Praxis      Cognition Arousal/Alertness: Awake/alert Behavior During Therapy: WFL for tasks assessed/performed Overall Cognitive Status: Impaired/Different from baseline Area of Impairment: Safety/judgement;Problem solving;Following commands                       Following Commands: Follows one step commands consistently;Follows multi-step commands inconsistently Safety/Judgement: Decreased awareness of safety;Decreased awareness of deficits   Problem Solving: Difficulty sequencing;Requires verbal cues;Slow processing General Comments: pt failed pillbox assessment as pt made > 3 errors with no awareness to deficits initally, however when asked pt to check over work pt with greater awareness to mistakes. noted difficulty with following mutlistep commands in relation to pillbox assessment. pt overall slow to process and had dificulty sequencing steps duirng assessment, pt requires verbal cues to orient pt to mistakes        Exercises     Shoulder Instructions       General Comments noted daughter agreeable to SNF as daughter cannot provide 24/7 assist as recommended.    Pertinent Vitals/ Pain       Pain Assessment: No/denies pain  Home Living                                          Prior Functioning/Environment              Frequency  Min 2X/week        Progress Toward Goals  OT Goals(current goals can now be found in the care plan section)  Progress towards OT goals: Progressing toward goals  Acute Rehab OT Goals Patient Stated Goal: "to get back to normal" OT Goal Formulation: With patient Time For Goal Achievement: 08/12/20 Potential to Achieve Goals: Good  Plan Discharge plan needs to be updated;Frequency needs to be updated    Co-evaluation                 AM-PAC OT "6 Clicks" Daily Activity     Outcome Measure   Help from another person eating meals?: None Help from another person taking care of  personal grooming?: A Little Help from another person toileting, which includes using toliet, bedpan, or urinal?: A Little Help from another person bathing (including washing, rinsing, drying)?: A Little Help from another person to put on and taking off regular upper body clothing?: None Help from another person to put on and taking off regular lower body clothing?: None 6 Click Score: 21    End of Session    OT Visit Diagnosis: Unsteadiness on feet (R26.81);Other symptoms and signs involving cognitive function   Activity Tolerance Patient tolerated treatment well   Patient Left in bed;with call bell/phone within reach;with bed alarm set   Nurse Communication Mobility status        Time: 6384-5364 OT Time Calculation (min): 18 min  Charges: OT General Charges $OT Visit: 1 Visit OT Treatments $Self Care/Home Management : 8-22 mins  Lanier Clam., COTA/L Acute Rehabilitation Services [phone]    Matthew Roman 07/31/2020, 8:44 AM

## 2020-08-01 LAB — CULTURE, BLOOD (ROUTINE X 2)
Culture: NO GROWTH
Special Requests: ADEQUATE

## 2020-08-02 LAB — CULTURE, BLOOD (ROUTINE X 2)
Culture: NO GROWTH
Special Requests: ADEQUATE

## 2021-01-26 ENCOUNTER — Emergency Department (HOSPITAL_COMMUNITY)
Admission: EM | Admit: 2021-01-26 | Discharge: 2021-01-26 | Disposition: A | Discharge status: Home / Self Care | Payer: No Typology Code available for payment source | Attending: Emergency Medicine | Admitting: Emergency Medicine

## 2021-01-26 ENCOUNTER — Other Ambulatory Visit: Payer: Self-pay

## 2021-01-26 ENCOUNTER — Telehealth: Payer: Self-pay

## 2021-01-26 DIAGNOSIS — Z7984 Long term (current) use of oral hypoglycemic drugs: Secondary | ICD-10-CM | POA: Diagnosis not present

## 2021-01-26 DIAGNOSIS — Z7982 Long term (current) use of aspirin: Secondary | ICD-10-CM | POA: Insufficient documentation

## 2021-01-26 DIAGNOSIS — F17200 Nicotine dependence, unspecified, uncomplicated: Secondary | ICD-10-CM | POA: Diagnosis not present

## 2021-01-26 DIAGNOSIS — Z79899 Other long term (current) drug therapy: Secondary | ICD-10-CM | POA: Diagnosis not present

## 2021-01-26 DIAGNOSIS — I10 Essential (primary) hypertension: Secondary | ICD-10-CM | POA: Diagnosis not present

## 2021-01-26 DIAGNOSIS — E119 Type 2 diabetes mellitus without complications: Secondary | ICD-10-CM | POA: Diagnosis not present

## 2021-01-26 DIAGNOSIS — J449 Chronic obstructive pulmonary disease, unspecified: Secondary | ICD-10-CM | POA: Insufficient documentation

## 2021-01-26 DIAGNOSIS — R4689 Other symptoms and signs involving appearance and behavior: Secondary | ICD-10-CM

## 2021-01-26 DIAGNOSIS — F0390 Unspecified dementia without behavioral disturbance: Secondary | ICD-10-CM | POA: Diagnosis present

## 2021-01-26 DIAGNOSIS — R4189 Other symptoms and signs involving cognitive functions and awareness: Secondary | ICD-10-CM | POA: Insufficient documentation

## 2021-01-26 DIAGNOSIS — F0391 Unspecified dementia with behavioral disturbance: Secondary | ICD-10-CM

## 2021-01-26 LAB — URINALYSIS, ROUTINE W REFLEX MICROSCOPIC
Bilirubin Urine: NEGATIVE
Glucose, UA: NEGATIVE mg/dL
Hgb urine dipstick: NEGATIVE
Ketones, ur: NEGATIVE mg/dL
Leukocytes,Ua: NEGATIVE
Nitrite: NEGATIVE
Protein, ur: NEGATIVE mg/dL
Specific Gravity, Urine: 1.017 (ref 1.005–1.030)
pH: 5 (ref 5.0–8.0)

## 2021-01-26 LAB — COMPREHENSIVE METABOLIC PANEL
ALT: 46 U/L — ABNORMAL HIGH (ref 0–44)
AST: 24 U/L (ref 15–41)
Albumin: 4.1 g/dL (ref 3.5–5.0)
Alkaline Phosphatase: 116 U/L (ref 38–126)
Anion gap: 10 (ref 5–15)
BUN: 12 mg/dL (ref 8–23)
CO2: 22 mmol/L (ref 22–32)
Calcium: 9.4 mg/dL (ref 8.9–10.3)
Chloride: 112 mmol/L — ABNORMAL HIGH (ref 98–111)
Creatinine, Ser: 0.79 mg/dL (ref 0.61–1.24)
GFR, Estimated: >60 mL/min (ref >60)
Glucose, Bld: 124 mg/dL — ABNORMAL HIGH (ref 70–99)
Potassium: 3.8 mmol/L (ref 3.5–5.1)
Sodium: 144 mmol/L (ref 135–145)
Total Bilirubin: 0.6 mg/dL (ref 0.3–1.2)
Total Protein: 7 g/dL (ref 6.5–8.1)

## 2021-01-26 LAB — CBC WITH DIFFERENTIAL/PLATELET
Abs Immature Granulocytes: 0.04 10*3/uL (ref 0.00–0.07)
Basophils Absolute: 0.1 10*3/uL (ref 0.0–0.1)
Basophils Relative: 1 %
Eosinophils Absolute: 0.2 10*3/uL (ref 0.0–0.5)
Eosinophils Relative: 3 %
HCT: 44.2 % (ref 39.0–52.0)
Hemoglobin: 14.4 g/dL (ref 13.0–17.0)
Immature Granulocytes: 1 %
Lymphocytes Relative: 19 %
Lymphs Abs: 1.3 10*3/uL (ref 0.7–4.0)
MCH: 29.1 pg (ref 26.0–34.0)
MCHC: 32.6 g/dL (ref 30.0–36.0)
MCV: 89.5 fL (ref 80.0–100.0)
Monocytes Absolute: 0.5 10*3/uL (ref 0.1–1.0)
Monocytes Relative: 7 %
Neutro Abs: 4.6 10*3/uL (ref 1.7–7.7)
Neutrophils Relative %: 69 %
Platelets: 103 10*3/uL — ABNORMAL LOW (ref 150–400)
RBC: 4.94 MIL/uL (ref 4.22–5.81)
RDW: 14 % (ref 11.5–15.5)
WBC: 6.6 10*3/uL (ref 4.0–10.5)
nRBC: 0 % (ref 0.0–0.2)

## 2021-01-26 LAB — AMMONIA: Ammonia: 17 umol/L (ref 9–35)

## 2021-01-26 MED ORDER — ALBUTEROL SULFATE HFA 108 (90 BASE) MCG/ACT IN AERS: 2.0000 | INHALATION_SPRAY | Freq: Four times a day (QID) | RESPIRATORY_TRACT | Status: DC | PRN

## 2021-01-26 MED ORDER — OXYCODONE HCL 5 MG PO TABS
15.0000 mg | ORAL_TABLET | Freq: Once | ORAL | Status: AC
  Administered 2021-01-26: 15 mg via ORAL
  Filled 2021-01-26: qty 3

## 2021-01-26 MED ORDER — OXYCODONE HCL 5 MG PO TABS
15.0000 mg | ORAL_TABLET | Freq: Three times a day (TID) | ORAL | Status: DC | PRN
  Administered 2021-01-26: 15 mg via ORAL
  Filled 2021-01-26: qty 3

## 2021-01-26 MED ORDER — ATORVASTATIN CALCIUM 10 MG PO TABS
10.0000 mg | ORAL_TABLET | Freq: Every day | ORAL | Status: DC
  Filled 2021-01-26: qty 1

## 2021-01-26 MED ORDER — METFORMIN HCL ER 500 MG PO TB24: 500.0000 mg | ORAL_TABLET | Freq: Two times a day (BID) | ORAL | Status: DC

## 2021-01-26 MED ORDER — ASPIRIN 81 MG PO CHEW
81.0000 mg | CHEWABLE_TABLET | Freq: Every day | ORAL | Status: DC
  Administered 2021-01-26: 81 mg via ORAL
  Filled 2021-01-26: qty 1

## 2021-01-26 MED ORDER — LACTULOSE 10 GM/15ML PO SOLN
5.0000 g | Freq: Every day | ORAL | Status: DC
  Administered 2021-01-26: 5 g via ORAL
  Filled 2021-01-26: qty 30

## 2021-01-26 MED ORDER — AMLODIPINE BESYLATE 5 MG PO TABS
5.0000 mg | ORAL_TABLET | Freq: Every day | ORAL | Status: DC
  Administered 2021-01-26: 5 mg via ORAL

## 2021-01-26 MED ORDER — PANTOPRAZOLE SODIUM 40 MG PO TBEC
40.0000 mg | DELAYED_RELEASE_TABLET | Freq: Every day | ORAL | Status: DC
  Administered 2021-01-26: 40 mg via ORAL
  Filled 2021-01-26: qty 1

## 2021-01-26 NOTE — Progress Notes (Signed)
CSW notified patients daughter that she needs to come pick up patient and follow-up with the [place] on Monday. Ms. Wynetta Emery stated she would be there within the hour.

## 2021-01-26 NOTE — Progress Notes (Signed)
PT Note  Patient Details Name: Matthew Roman MRN: [id number] DOB: [birth year]       PT order for this patient . Have been in communication with SW . Pt with dementia and was from memory care, at this time does not seem appropriate for PT and see family is coming to pick patient up as well. Please let us know if there are acute skilled PT needs and we will continue to pursue PT assessment if appropriate.   Clide Dales 01/26/2021, 3:09 PM  Gatha Mayer, PT, MPT Acute Rehabilitation Services Office: [phone] Pager: [phone] 01/26/2021

## 2021-01-26 NOTE — Discharge Instructions (Addendum)
Please continue his home medications as prescribed by her primary doctor.  [facility] with [facility] for [facility] with [facility] in facility.

## 2021-01-26 NOTE — Progress Notes (Signed)
CSW got a hold of Matthew Roman from [place] who told CSW that patient was discharged because he has no payer source. Patients [facility] insurance will not cover any facility. Patients daughter applied for Medicaid but was denied because patient transferred a house to daughter and currently has too many assets to qualify for Medicaid to pay for Memory care or any facility. Patient will have to wait until September to apply for Medicaid. September will be the 2 year mark since patient transferred his home to his daughter. [name] stated they thought when he came on May 3rd that the funding was taken care of but when patients daughter went to apply at [facility] she was denied.

## 2021-01-26 NOTE — Progress Notes (Signed)
CSW notified daughter of the Medicaid issue. CSW told patient to try [facility] to see if they have resources for adult day programs for her father. CSW also told her to follow-up with the [facility] Monday. Ms. Matthew Roman stated her daughter is on the way to pick patient up and should be there 30 minutes or less.

## 2021-01-26 NOTE — Progress Notes (Signed)
CSW spoke with after hours social worker Adaku Okivedi about patient. CSW was unable to get any information other then patient was admitted to [facility] on May 3rd. CSW was told for someone to call [facility], [phone], ext [phone] to find out the plan for patient on Monday. CSW will call the family again to pick-up patient

## 2021-01-26 NOTE — Progress Notes (Signed)
CSW contacted the [facility] and spoke with Marcello Moores the social worker at extension [phone]. Marcello Moores told CSW that he does not know this patient and does not work in that area. Marcello Moores said he would work on it and reach out to someone who could possibly help CSW. CSW asked about emergency placement and Marcello Moores was unsure. Marcello Moores stated that he would get someone to call CSW back today.

## 2021-01-26 NOTE — ED Notes (Signed)
Per SW, granddaughter is 30 minutes away

## 2021-01-26 NOTE — Progress Notes (Signed)
CSW contacted the daughter again. CSW sent a text message as well.
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
## 2021-01-26 NOTE — ED Provider Notes (Addendum)
  Physical Exam  BP 111/75   Pulse (!) 58   Temp 98.2 F (36.8 C) (Oral)   Resp 17   Ht 1.829 m (6')   SpO2 96%   BMI 24.01 kg/m   Physical Exam  ED Course/Procedures     Procedures  MDM  65 yo male here after leaving facility ama, daughter left him with [place] after having difficulty caring for him- reports signicant memory issues. Social worker has seen and evaluated patient.  She has Discussed with daughter. Unclear exactly what occurred at the skilled nursing facility.  According to social work, there is no one at the [place] or at the skilled nursing facility today to confirm exactly what occurred.  It be extremely unusual the patient be transferred to skilled nursing facility telemetry and discharged after 3 days.  Current plan is for patient to be discharged home with any home health care needs met for outpatient placement back and skilled nursing. Per SW      Pattricia Boss, MD 01/26/21 1404    Pattricia Boss, MD 01/26/21 [phone]

## 2021-01-26 NOTE — Progress Notes (Signed)
CSW contacted [facility] [place] who told CSW she could not tell her anything because she did not work yesterday and did not have his information. The nurse stated all she knows is there was something about the family not being able to afford it. CSW was told there is no one here on the weekend to answer CSW's questions about why he was discharged and if there was an issue with his insurance coverage.

## 2021-01-26 NOTE — Progress Notes (Signed)
CSW contacted patients daughter, Donney Rankins and left a VM requesting a call back.    CSW contacted patients granddaughter Noah Delaine. CSW asked Noah Delaine who will be picking patient up and she stated probably her mother Donney Rankins. CSW stated she had left a message for her. CSW asked Noah Delaine if she could get in touch with her mother and tell her to call CSW. Noah Delaine stated she would try to let her know to call CSW.

## 2021-01-26 NOTE — Telephone Encounter (Signed)
[facility] called to verify oxycodone script sent in, he has not had it filled since novemeber 2021 and they were wondering why he needed it . Explained his diagnosis currently and chronic pain nature.
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
## 2021-01-26 NOTE — Progress Notes (Signed)
CSW received a call back from patients daughter. CSW asked Ms. Wynetta Emery what time she would be picking her father up. Ms. Wynetta Emery asked CSW if patient was going to be placed. CSW stated not from the ED. Ms. Wynetta Emery stated she has a sick husband who is on the transplant list and if they call she will have to go to [facility]. Ms. Wynetta Emery stated she has no one that can stay with her father. CSW asked about other family members and she stated no. CSW asked about her daughter. Ms. Wynetta Emery stated she lives a hour away. CSW stated if she needs to go to [facility] can her daughter come sit with him. Ms. Wynetta Emery stated no. CSW told Ms. Wynetta Emery that her father cannot live in the ED and that someone would need to come get him. CSW stated she even called some memory care facilities and it comes down to patients insurance coverage. Ms. Wynetta Emery stated her father was discharged from [facility] in [place] yesterday and was admitted at the beginning of the week. CSW asked why he was discharged so quickly without no plan in place and she stated she did not know and there was something about her father not being eligible for special assistance until Sept. Ms. Wynetta Emery was unable to answer questions about patients insurance coverage and if patient had long term benefits. CSW stated if they discharge due to insurance coverage then that will be an issue for all facilities unless you pay out of pocket. Ms. Wynetta Emery also stated she has no medication for him either. Ms. Wynetta Emery asked if [facility] could take him back for the weekend. CSW stated if they discharged him then they would have to repeat the admission process. Ms. Wynetta Emery stated his [place] social worker is Carey Bullocks and that she was off this week. Ms. Wynetta Emery stated her supervisor was supposed to handle everything. Ms. Wynetta Emery has no name for the supervisor. CSW stated she would reach out to [place]. CSW asked what time she would be at the hospital and she stated she  does not know. CSW stated she needs to be thinking of a plan because CSW cannot guarantee that [place] can help her this weekend.

## 2022-03-06 ENCOUNTER — Inpatient Hospital Stay (HOSPITAL_COMMUNITY)
Admission: EM | Admit: 2022-03-06 | Discharge: 2022-03-09 | DRG: 093 | Disposition: A | Discharge status: Home / Self Care | Payer: No Typology Code available for payment source | Attending: Neurology | Admitting: Neurology

## 2022-03-06 ENCOUNTER — Emergency Department (HOSPITAL_COMMUNITY): Payer: No Typology Code available for payment source

## 2022-03-06 ENCOUNTER — Encounter (HOSPITAL_COMMUNITY): Payer: Self-pay

## 2022-03-06 ENCOUNTER — Other Ambulatory Visit: Payer: Self-pay

## 2022-03-06 DIAGNOSIS — R4701 Aphasia: Principal | ICD-10-CM | POA: Diagnosis present

## 2022-03-06 DIAGNOSIS — Z8673 Personal history of transient ischemic attack (TIA), and cerebral infarction without residual deficits: Secondary | ICD-10-CM

## 2022-03-06 DIAGNOSIS — J439 Emphysema, unspecified: Secondary | ICD-10-CM | POA: Diagnosis present

## 2022-03-06 DIAGNOSIS — R29712 NIHSS score 12: Secondary | ICD-10-CM | POA: Diagnosis present

## 2022-03-06 DIAGNOSIS — Z79899 Other long term (current) drug therapy: Secondary | ICD-10-CM

## 2022-03-06 DIAGNOSIS — E785 Hyperlipidemia, unspecified: Secondary | ICD-10-CM | POA: Diagnosis present

## 2022-03-06 DIAGNOSIS — F17210 Nicotine dependence, cigarettes, uncomplicated: Secondary | ICD-10-CM | POA: Diagnosis present

## 2022-03-06 DIAGNOSIS — I639 Cerebral infarction, unspecified: Secondary | ICD-10-CM | POA: Diagnosis not present

## 2022-03-06 DIAGNOSIS — Z20822 Contact with and (suspected) exposure to covid-19: Secondary | ICD-10-CM | POA: Diagnosis present

## 2022-03-06 DIAGNOSIS — Z823 Family history of stroke: Secondary | ICD-10-CM

## 2022-03-06 DIAGNOSIS — F17200 Nicotine dependence, unspecified, uncomplicated: Secondary | ICD-10-CM | POA: Diagnosis present

## 2022-03-06 DIAGNOSIS — Z7982 Long term (current) use of aspirin: Secondary | ICD-10-CM

## 2022-03-06 DIAGNOSIS — G459 Transient cerebral ischemic attack, unspecified: Secondary | ICD-10-CM | POA: Diagnosis present

## 2022-03-06 DIAGNOSIS — K7460 Unspecified cirrhosis of liver: Secondary | ICD-10-CM | POA: Diagnosis present

## 2022-03-06 DIAGNOSIS — Z8719 Personal history of other diseases of the digestive system: Secondary | ICD-10-CM

## 2022-03-06 DIAGNOSIS — J449 Chronic obstructive pulmonary disease, unspecified: Secondary | ICD-10-CM

## 2022-03-06 DIAGNOSIS — I10 Essential (primary) hypertension: Secondary | ICD-10-CM | POA: Diagnosis present

## 2022-03-06 DIAGNOSIS — R471 Dysarthria and anarthria: Secondary | ICD-10-CM | POA: Diagnosis present

## 2022-03-06 DIAGNOSIS — E119 Type 2 diabetes mellitus without complications: Secondary | ICD-10-CM | POA: Diagnosis present

## 2022-03-06 DIAGNOSIS — Z7984 Long term (current) use of oral hypoglycemic drugs: Secondary | ICD-10-CM

## 2022-03-06 LAB — CBC
HCT: 51 % (ref 39.0–52.0)
Hemoglobin: 17.3 g/dL — ABNORMAL HIGH (ref 13.0–17.0)
MCH: 30.7 pg (ref 26.0–34.0)
MCHC: 33.9 g/dL (ref 30.0–36.0)
MCV: 90.6 fL (ref 80.0–100.0)
Platelets: 112 10*3/uL — ABNORMAL LOW (ref 150–400)
RBC: 5.63 MIL/uL (ref 4.22–5.81)
RDW: 14 % (ref 11.5–15.5)
WBC: 5.9 10*3/uL (ref 4.0–10.5)
nRBC: 0 % (ref 0.0–0.2)

## 2022-03-06 LAB — COMPREHENSIVE METABOLIC PANEL
ALT: 34 U/L (ref 0–44)
AST: 23 U/L (ref 15–41)
Albumin: 4.3 g/dL (ref 3.5–5.0)
Alkaline Phosphatase: 88 U/L (ref 38–126)
Anion gap: 9 (ref 5–15)
BUN: 11 mg/dL (ref 8–23)
CO2: 26 mmol/L (ref 22–32)
Calcium: 9.3 mg/dL (ref 8.9–10.3)
Chloride: 107 mmol/L (ref 98–111)
Creatinine, Ser: 1.08 mg/dL (ref 0.61–1.24)
GFR, Estimated: >60 mL/min (ref >60)
Glucose, Bld: 90 mg/dL (ref 70–99)
Potassium: 4.2 mmol/L (ref 3.5–5.1)
Sodium: 142 mmol/L (ref 135–145)
Total Bilirubin: 1 mg/dL (ref 0.3–1.2)
Total Protein: 7 g/dL (ref 6.5–8.1)

## 2022-03-06 LAB — I-STAT CHEM 8, ED
BUN: 12 mg/dL (ref 8–23)
Calcium, Ion: 1.12 mmol/L — ABNORMAL LOW (ref 1.15–1.40)
Chloride: 105 mmol/L (ref 98–111)
Creatinine, Ser: 1 mg/dL (ref 0.61–1.24)
Glucose, Bld: 87 mg/dL (ref 70–99)
HCT: 50 % (ref 39.0–52.0)
Hemoglobin: 17 g/dL (ref 13.0–17.0)
Potassium: 4.2 mmol/L (ref 3.5–5.1)
Sodium: 143 mmol/L (ref 135–145)
TCO2: 26 mmol/L (ref 22–32)

## 2022-03-06 LAB — DIFFERENTIAL
Abs Immature Granulocytes: 0.03 10*3/uL (ref 0.00–0.07)
Basophils Absolute: 0.1 10*3/uL (ref 0.0–0.1)
Basophils Relative: 1 %
Eosinophils Absolute: 0.2 10*3/uL (ref 0.0–0.5)
Eosinophils Relative: 3 %
Immature Granulocytes: 1 %
Lymphocytes Relative: 23 %
Lymphs Abs: 1.3 10*3/uL (ref 0.7–4.0)
Monocytes Absolute: 0.4 10*3/uL (ref 0.1–1.0)
Monocytes Relative: 6 %
Neutro Abs: 3.9 10*3/uL (ref 1.7–7.7)
Neutrophils Relative %: 66 %

## 2022-03-06 LAB — ETHANOL: Alcohol, Ethyl (B): <10 mg/dL (ref <10)

## 2022-03-06 LAB — RESP PANEL BY RT-PCR (FLU A&B, COVID) ARPGX2
Influenza A by PCR: NEGATIVE
Influenza B by PCR: NEGATIVE
SARS Coronavirus 2 by RT PCR: NEGATIVE

## 2022-03-06 LAB — APTT: aPTT: 30 seconds (ref 24–36)

## 2022-03-06 LAB — PROTIME-INR
INR: 1.1 (ref 0.8–1.2)
Prothrombin Time: 13.6 seconds (ref 11.4–15.2)

## 2022-03-06 LAB — CBG MONITORING, ED: Glucose-Capillary: 80 mg/dL (ref 70–99)

## 2022-03-06 MED ORDER — TENECTEPLASE FOR STROKE
0.2500 mg/kg | PACK | Freq: Once | INTRAVENOUS | Status: AC
Start: 2022-03-06 — End: 2022-03-06
  Administered 2022-03-06: 19 mg via INTRAVENOUS
  Filled 2022-03-06: qty 10

## 2022-03-06 MED ORDER — IOHEXOL 350 MG/ML SOLN
60.0000 mL | Freq: Once | INTRAVENOUS | Status: AC | PRN
  Administered 2022-03-06: 60 mL via INTRAVENOUS

## 2022-03-06 NOTE — ED Provider Notes (Signed)
Emergency Department Provider Note   I have reviewed the triage vital signs and the nursing notes.   HISTORY  Chief Complaint Code Stroke   HPI Matthew Roman is a 66 y.o. male with PMH including HTN, DM, and prior CVA presents to the ED by private vehicle accompanied by family. They report that patient was last normal at 7:45 PM. He suddenly became very weak and had significant difficulty speaking. Patient takes 81 mg ASA at home. History limited with patient's aphasia.    Past Medical History:  Diagnosis Date   Cirrhosis ([place])    COPD (chronic obstructive pulmonary disease) ([place])    Diabetes mellitus without complication ([place])    Hepatitis    Hypertension    Stroke [facility])     Review of Systems  Constitutional: No fever/chills Cardiovascular: Denies chest pain. Respiratory: Denies shortness of breath. Gastrointestinal: No abdominal pain. Neurological: Positive sudden speech change and left grip weakness.   ____________________________________________   PHYSICAL EXAM:  VITAL SIGNS: ED Triage Vitals  Enc Vitals Group     BP 03/06/22 2047 (!) 177/87     Pulse Rate 03/06/22 2047 64     Resp 03/06/22 2047 18     Temp 03/06/22 2047 97.6 F (36.4 C)     Temp src --      SpO2 03/06/22 2047 99 %     Weight --      Height 03/06/22 2059 6' (1.829 m)   Constitutional: Alert with apparent aphasia.  Eyes: Conjunctivae are normal. PERRL Head: Atraumatic. Nose: No congestion/rhinnorhea. Mouth/Throat: Mucous membranes are moist.  Neck: No stridor.   Cardiovascular: Normal rate, regular rhythm. Good peripheral circulation. Grossly normal heart sounds.   Respiratory: Normal respiratory effort.  No retractions. Lungs CTAB. Gastrointestinal: No distention.  Musculoskeletal:  No gross deformities of extremities. Neurologic:  Positive aphasia with left grip weakness compared to right. No sensory deficit.  Skin:  Skin is warm, dry and intact. No rash  noted.   ____________________________________________   LABS (all labs ordered are listed, but only abnormal results are displayed)  Labs Reviewed  CBC - Abnormal; Notable for the following components:      Result Value   Hemoglobin 17.3 (*)    Platelets 112 (*)    All other components within normal limits  I-STAT CHEM 8, ED - Abnormal; Notable for the following components:   Calcium, Ion 1.12 (*)    All other components within normal limits  RESP PANEL BY RT-PCR (FLU A&B, COVID) ARPGX2  ETHANOL  PROTIME-INR  APTT  DIFFERENTIAL  COMPREHENSIVE METABOLIC PANEL  RAPID URINE DRUG SCREEN, HOSP PERFORMED  URINALYSIS, ROUTINE W REFLEX MICROSCOPIC  CBG MONITORING, ED   ____________________________________________  EKG   EKG Interpretation  Date/Time:  Thursday March 06 2022 20:50:21 EDT Ventricular Rate:  72 PR Interval:  110 QRS Duration: 84 QT Interval:  362 QTC Calculation: 396 R Axis:   60 Text Interpretation: Sinus rhythm with short PR Otherwise normal ECG When compared with ECG of 28-Jul-2020 07:02, PREVIOUS ECG IS PRESENT Confirmed by Nanda Quinton [phone]) on 03/06/2022 10:30:01 PM        ____________________________________________  RADIOLOGY  CT HEAD CODE STROKE WO CONTRAST  Result Date: 03/06/2022 CLINICAL DATA:  Code stroke. Initial evaluation for neuro deficit, stroke suspected, aphasia, left hand weakness. EXAM: CT HEAD WITHOUT CONTRAST TECHNIQUE: Contiguous axial images were obtained from the base of the skull through the vertex without intravenous contrast. RADIATION DOSE REDUCTION: This exam was performed according  to the departmental dose-optimization program which includes automated exposure control, adjustment of the mA and/or kV according to patient size and/or use of iterative reconstruction technique. COMPARISON:  Prior CT from 07/27/2020. FINDINGS: Brain: Age-related cerebral atrophy with chronic microvascular ischemic disease, progressed from prior.  Few scattered remote lacunar infarcts present about the left greater than right basal ganglia and thalami. No acute intracranial hemorrhage. No acute large vessel territory infarct. No mass lesion or midline shift. Ex vacuo dilatation left lateral ventricle related to the chronic left basal ganglia infarcts without hydrocephalus. No extra-axial fluid collection. Vascular: No hyperdense vessel. Calcified atherosclerosis present at skull base. Skull: Scalp soft tissues within normal limits.  Calvarium intact. Sinuses/Orbits: Globes orbital soft tissues demonstrate no acute finding. Paranasal sinuses are largely clear. No significant mastoid effusion. Other: None. ASPECTS [facility] Stroke Program Early CT Score) - Ganglionic level infarction (caudate, lentiform nuclei, internal capsule, insula, M1-M3 cortex): 7 - Supraganglionic infarction (M4-M6 cortex): 3 Total score (0-10 with 10 being normal): 10 IMPRESSION: 1. No acute intracranial abnormality. 2. ASPECTS is 10. 3. Age-related cerebral atrophy with moderate to advanced chronic microvascular ischemic disease, with multiple scattered remote lacunar infarcts as above. These results were communicated to Dr. Cheral Marker at 9:18 pm on 03/06/2022 by text page via the [facility] messaging system. Electronically Signed   By: Jeannine Boga M.D.   On: 03/06/2022 21:20    ____________________________________________   PROCEDURES  Procedure(s) performed:   Procedures  CRITICAL CARE Performed by: Margette Fast Total critical care time: 35 minutes Critical care time was exclusive of separately billable procedures and treating other patients. Critical care was necessary to treat or prevent imminent or life-threatening deterioration. Critical care was time spent personally by me on the following activities: development of treatment plan with patient and/or surrogate as well as nursing, discussions with consultants, evaluation of patient's response to treatment,  examination of patient, obtaining history from patient or surrogate, ordering and performing treatments and interventions, ordering and review of laboratory studies, ordering and review of radiographic studies, pulse oximetry and re-evaluation of patient's condition.  Nanda Quinton, MD Emergency Medicine  ____________________________________________   INITIAL IMPRESSION / ASSESSMENT AND PLAN / ED COURSE  Pertinent labs & imaging results that were available during my care of the patient were reviewed by me and considered in my medical decision making (see chart for details).   This patient is Presenting for Evaluation of AMS, which does require a range of treatment options, and is a complaint that involves a high risk of morbidity and mortality.  The Differential Diagnoses includes but is not exclusive to alcohol, illicit or prescription medications, intracranial pathology such as stroke, intracerebral hemorrhage, fever or infectious causes including sepsis, hypoxemia, uremia, trauma, endocrine related disorders such as diabetes, hypoglycemia, thyroid-related diseases, etc.   Critical Interventions-    Medications  tenecteplase (TNKASE) injection for Stroke 19 mg (19 mg Intravenous Given 03/06/22 2135)  iohexol (OMNIPAQUE) 350 MG/ML injection 60 mL (60 mLs Intravenous Contrast Given 03/06/22 2202)    Reassessment after intervention: No change in symptoms.    I did obtain Additional Historical Information from family at bedside.  I decided to review pertinent External Data, and in summary last admit was 2021 for AMS.   Clinical Laboratory Tests Ordered, included no acute kidney injury.  No anemia.  No electrolyte disturbance.  Radiologic Tests Ordered, included CT head. I independently interpreted the images and agree with radiology interpretation.   Cardiac Monitor Tracing which shows NSR.   Social  Determinants of Health Risk patient with a smoking history.   Consult complete with  Neurology. Plan for TNK and admit.   Medical Decision Making: Summary:  Patient arrives to the emergency department by private vehicle.  He was seen during the medical screening exam process and activated as a code stroke at that point.  Patient taken immediately to [place] with neurology.  Decision made for TNK.  Patient is not anticoagulated.  He did not require blood pressure management acutely in the ED.   Reevaluation with update and discussion with patient after TNK. No immediate improvement in symptoms. No decline in mental status. Plan for Neurology admit.   Disposition: admit  ____________________________________________  FINAL CLINICAL IMPRESSION(S) / ED DIAGNOSES  Final diagnoses:  Acute ischemic stroke [facility])    Note:  This document was prepared using Dragon voice recognition software and may include unintentional dictation errors.  Nanda Quinton, MD, [facility] Emergency Medicine    Anikin Prosser, [facility], MD 03/06/22 2233

## 2022-03-06 NOTE — Progress Notes (Signed)
PHARMACIST CODE STROKE RESPONSE  Notified to mix TNK at 2122 by Dr. Cheral Marker. Delivered TNK to RN at 2132. Given at 2133.  TNK dose = 19 mg IV over 5 seconds  Issues/delays encountered (if applicable):  Code stroke from Triage. Decision to give TNK made before a documented weight in chart available (not obtained in triage prior to transport to CT). Weight obtained at time of TNK order and patient transported back to room while TNK was being reconstituted by pharmacy at bedside. BP also noted to be elevated in triage (near contraindication point) -- decision made to repeat BP prior to administration of TNK to ensure not contraindicated. Repeat BP was ok, so TNK given at that time (2133).  Lorelei Pont, PharmD, BCPS 03/06/2022 9:37 PM ED Clinical Pharmacist -  [phone]

## 2022-03-06 NOTE — ED Triage Notes (Signed)
Pt comes POV, LSN at 745pm, pt then became confused and aphasic, pt has generalized weakness, L grip weaker than the right

## 2022-03-06 NOTE — H&P (Signed)
Admission H&P    Chief Complaint: Acute onset of aphasia  HPI: Matthew Roman is an 66 y.o. male current smoker with a PMHx of hepatitis and cirrhosis, COPD, DM, HTN and prior strokes x 3 who presents to the ED with acute onset of aphasia. LKN was 7:45 PM. He was noted to be diffusely weak on evaluation in Triage but with left grip weaker than right. He is on ASA at home. Not on any blood thinners.   Past Medical History:  Diagnosis Date   Cirrhosis ([place])    COPD (chronic obstructive pulmonary disease) ([place])    Diabetes mellitus without complication ([place])    Hepatitis    Hypertension    Stroke [facility])     Past Surgical History:  Procedure Laterality Date   BACK SURGERY     HIP PINNING,CANNULATED Right 09/04/2018   Procedure: CANNULATED HIP PINNING;  Surgeon: Marchia Bond, MD;  Location: [place];  Service: Orthopedics;  Laterality: Right;   KNEE SURGERY     SHOULDER SURGERY      Family History  Problem Relation Age of Onset   Stroke Mother    Osteoporosis Neg Hx    Social History:  reports that he has been smoking. He has been smoking an average of .5 packs per day. He has never used smokeless tobacco. He reports that he does not drink alcohol and does not use drugs.  Allergies: No Known Allergies  No current facility-administered medications on file prior to encounter.   Current Outpatient Medications on File Prior to Encounter  Medication Sig Dispense Refill   albuterol (PROAIR HFA) 108 (90 Base) MCG/ACT inhaler Inhale into the lungs every 6 (six) hours as needed for wheezing or shortness of breath.     aspirin 81 MG chewable tablet Chew 81 mg by mouth daily.     atorvastatin (LIPITOR) 10 MG tablet Take 10 mg by mouth at bedtime.     Cholecalciferol (VITAMIN D) 50 MCG (2000 UT) CAPS Take 2,000 Units by mouth daily.     lactulose (CHRONULAC) 10 GM/15ML solution Take 7.5 mLs (5 g total) by mouth daily. 236 mL 0   metFORMIN (GLUCOPHAGE-XR) 500 MG 24 hr tablet Take 500 mg by  mouth 2 (two) times daily with a meal.     oxyCODONE (ROXICODONE) 15 MG immediate release tablet Take 1 tablet (15 mg total) by mouth every 8 (eight) hours as needed for pain. 30 tablet 0   vitamin B-12 (CYANOCOBALAMIN) 500 MCG tablet Take 2 tablets by mouth daily.     atorvastatin (LIPITOR) 20 MG tablet Take 1 tablet (20 mg total) by mouth daily at 6 PM. (Patient not taking: Reported on 03/06/2022)       ROS: Unable to obtain due to aphasia.   Physical Examination: Blood pressure (!) 177/87, pulse 64, temperature 97.6 F (36.4 C), resp. rate 18, height 6' (1.829 m), SpO2 99 %.  HEENT-  [place]/AT  Lungs - Respirations unlabored Extremities - No edema  Neurologic Examination: Mental Status: Awake with decreased level of alertness. Globally aphasic with only verbal output consisting of stuttering nonsensical utterances. Unable to follow any verbal commands, but can follow some pantomimed commands.  Cranial Nerves: II:  Blinks to threat in left temporal visual field but not in right hemifields OU. Pupils equal bilaterally. Will glance at visual stimuli.  III,IV, VI: EOM are full but with saccadic quality of visual pursuits. No nystagmus.  V: Reacts to touch VII: Grimace is symmetric.  VIII: Will alert  to vocal stimuli IX,X: Unable to assess XI: Head is midline XII: Does not protrude tongue to command.  Motor: BUE 4+/5 strength without asymmetry. Arms remain elevated when raised by examiner, without drift.  BLE 4+/5 bilaterally without asymmetry.  Decreased muscle bulk x 4 (chronic liver disease) Sensory: Reacts to noxious in all 4 extremities.  Deep Tendon Reflexes:  3+ bilateral brachioradialis, biceps and patellae. 2+ bilateral achilles.  Toes downgoing bilaterally  Cerebellar: No ataxia with FNF bilaterally (follows pantomimed demonstration) Gait: Deferred in the context of acuity of presentation    Results for orders placed or performed during the hospital encounter of 03/06/22  (from the past 48 hour(s))  CBG monitoring, ED     Status: None   Collection Time: 03/06/22  8:52 PM  Result Value Ref Range   Glucose-Capillary 80 70 - 99 mg/dL    Comment: Glucose reference range applies only to samples taken after fasting for at least 8 hours.  I-stat chem 8, ED     Status: Abnormal   Collection Time: 03/06/22  9:15 PM  Result Value Ref Range   Sodium 143 135 - 145 mmol/L   Potassium 4.2 3.5 - 5.1 mmol/L   Chloride 105 98 - 111 mmol/L   BUN 12 8 - 23 mg/dL   Creatinine, Ser 1.00 0.61 - 1.24 mg/dL   Glucose, Bld 87 70 - 99 mg/dL    Comment: Glucose reference range applies only to samples taken after fasting for at least 8 hours.   Calcium, Ion 1.12 (L) 1.15 - 1.40 mmol/L   TCO2 26 22 - 32 mmol/L   Hemoglobin 17.0 13.0 - 17.0 g/dL   HCT 50.0 39.0 - 52.0 %   CT HEAD CODE STROKE WO CONTRAST  Result Date: 03/06/2022 CLINICAL DATA:  Code stroke. Initial evaluation for neuro deficit, stroke suspected, aphasia, left hand weakness. EXAM: CT HEAD WITHOUT CONTRAST TECHNIQUE: Contiguous axial images were obtained from the base of the skull through the vertex without intravenous contrast. RADIATION DOSE REDUCTION: This exam was performed according to the departmental dose-optimization program which includes automated exposure control, adjustment of the mA and/or kV according to patient size and/or use of iterative reconstruction technique. COMPARISON:  Prior CT from 07/27/2020. FINDINGS: Brain: Age-related cerebral atrophy with chronic microvascular ischemic disease, progressed from prior. Few scattered remote lacunar infarcts present about the left greater than right basal ganglia and thalami. No acute intracranial hemorrhage. No acute large vessel territory infarct. No mass lesion or midline shift. Ex vacuo dilatation left lateral ventricle related to the chronic left basal ganglia infarcts without hydrocephalus. No extra-axial fluid collection. Vascular: No hyperdense vessel.  Calcified atherosclerosis present at skull base. Skull: Scalp soft tissues within normal limits.  Calvarium intact. Sinuses/Orbits: Globes orbital soft tissues demonstrate no acute finding. Paranasal sinuses are largely clear. No significant mastoid effusion. Other: None. ASPECTS [facility] Stroke Program Early CT Score) - Ganglionic level infarction (caudate, lentiform nuclei, internal capsule, insula, M1-M3 cortex): 7 - Supraganglionic infarction (M4-M6 cortex): 3 Total score (0-10 with 10 being normal): 10 IMPRESSION: 1. No acute intracranial abnormality. 2. ASPECTS is 10. 3. Age-related cerebral atrophy with moderate to advanced chronic microvascular ischemic disease, with multiple scattered remote lacunar infarcts as above. These results were communicated to Dr. Cheral Marker at 9:18 pm on 03/06/2022 by text page via the [facility] messaging system. Electronically Signed   By: Jeannine Boga M.D.   On: 03/06/2022 21:20    Assessment: 66 year old male presenting with acute onset of  aphasia 1. Exam reveals findings referable to the left MCA territory, most likely a cortically-based medium sized stroke involving the perisylvian cortices and interrupting the optic radiations. NIHSS = 12. 2. CT head:  No acute intracranial abnormality. ASPECTS is 10. Age-related cerebral atrophy with moderate to advanced chronic microvascular ischemic disease, with multiple scattered remote lacunar infarcts.  3. Stroke risk factors: DM, HTN and prior strokes  4. After comprehensive review of possible contraindications, he has no absolute contraindications to TNK administration. 5. Patient is a TNK candidate. Discussed extensively the risks/benefits of TNK treatment vs. no treatment with the patient's family, including risks of hemorrhage and death with IV thrombolytic administration versus worse overall outcomes on average in patients within the thrombolysis time window who are not administered TNK. The patient's aphasia precludes  meaningful medical decision making on his part at this time. Overall benefits of TNK regarding long-term prognosis are felt to outweigh risks. The patient's family expressed understanding and wish to proceed with TNK.  6. CTA of head and neck with no LVO. Mild to moderate intracranial atherosclerotic disease. Associated severe distal left P3 stenosis. No proximal high-grade or correctable stenosis. Atheromatous change about the carotid bifurcations without significant stenosis. No hemodynamically significant stenosis within the neck. 7. Also seen on CTA is a 3 mm left upper lobe nodule, indeterminate. A non-contrast Chest CT at 12 months is optional.   Plan: 1. Admitting to Neuro ICU following TNK administration.  2. Post-TNK order set to include frequent neuro checks and BP management.  3. No antiplatelet medications or anticoagulants for at least 24 hours following TNK.  4. DVT prophylaxis with SCDs.  5. Statin.  6. Will need to restart ASA if follow up CT at 24 hours is negative for hemorrhagic conversion. Also will need to start Plavix at that time as he has failed ASA monotherapy. 7. Cardiac telemetry.   8. TTE.  9. MRI brain.  10. PT/OT/Speech.  11. NPO until passes swallow evaluation.  12. Sliding scale insulin.  13. Fasting lipid panel, HgbA1c  55 minutes spent in the emergent neurological evaluation and management of this critically ill patient.   Electronically signed: Dr. Kerney Elbe 03/06/2022, 9:31 PM

## 2022-03-07 ENCOUNTER — Inpatient Hospital Stay (HOSPITAL_COMMUNITY): Payer: No Typology Code available for payment source

## 2022-03-07 DIAGNOSIS — I63412 Cerebral infarction due to embolism of left middle cerebral artery: Secondary | ICD-10-CM | POA: Diagnosis not present

## 2022-03-07 DIAGNOSIS — I639 Cerebral infarction, unspecified: Secondary | ICD-10-CM | POA: Diagnosis present

## 2022-03-07 DIAGNOSIS — E785 Hyperlipidemia, unspecified: Secondary | ICD-10-CM | POA: Diagnosis present

## 2022-03-07 DIAGNOSIS — E7800 Pure hypercholesterolemia, unspecified: Secondary | ICD-10-CM | POA: Diagnosis not present

## 2022-03-07 DIAGNOSIS — R569 Unspecified convulsions: Secondary | ICD-10-CM | POA: Diagnosis not present

## 2022-03-07 DIAGNOSIS — I6389 Other cerebral infarction: Secondary | ICD-10-CM | POA: Diagnosis not present

## 2022-03-07 DIAGNOSIS — R29712 NIHSS score 12: Secondary | ICD-10-CM | POA: Diagnosis present

## 2022-03-07 DIAGNOSIS — E119 Type 2 diabetes mellitus without complications: Secondary | ICD-10-CM | POA: Diagnosis present

## 2022-03-07 DIAGNOSIS — Z8673 Personal history of transient ischemic attack (TIA), and cerebral infarction without residual deficits: Secondary | ICD-10-CM | POA: Diagnosis not present

## 2022-03-07 DIAGNOSIS — J439 Emphysema, unspecified: Secondary | ICD-10-CM | POA: Diagnosis present

## 2022-03-07 DIAGNOSIS — I10 Essential (primary) hypertension: Secondary | ICD-10-CM | POA: Diagnosis present

## 2022-03-07 DIAGNOSIS — G459 Transient cerebral ischemic attack, unspecified: Secondary | ICD-10-CM

## 2022-03-07 DIAGNOSIS — Z823 Family history of stroke: Secondary | ICD-10-CM | POA: Diagnosis not present

## 2022-03-07 DIAGNOSIS — Z7984 Long term (current) use of oral hypoglycemic drugs: Secondary | ICD-10-CM | POA: Diagnosis not present

## 2022-03-07 DIAGNOSIS — R471 Dysarthria and anarthria: Secondary | ICD-10-CM | POA: Diagnosis present

## 2022-03-07 DIAGNOSIS — K7460 Unspecified cirrhosis of liver: Secondary | ICD-10-CM | POA: Diagnosis present

## 2022-03-07 DIAGNOSIS — Z79899 Other long term (current) drug therapy: Secondary | ICD-10-CM | POA: Diagnosis not present

## 2022-03-07 DIAGNOSIS — Z7982 Long term (current) use of aspirin: Secondary | ICD-10-CM | POA: Diagnosis not present

## 2022-03-07 DIAGNOSIS — Z20822 Contact with and (suspected) exposure to covid-19: Secondary | ICD-10-CM | POA: Diagnosis present

## 2022-03-07 DIAGNOSIS — Z8719 Personal history of other diseases of the digestive system: Secondary | ICD-10-CM | POA: Diagnosis not present

## 2022-03-07 DIAGNOSIS — R4701 Aphasia: Secondary | ICD-10-CM | POA: Diagnosis present

## 2022-03-07 DIAGNOSIS — F17210 Nicotine dependence, cigarettes, uncomplicated: Secondary | ICD-10-CM | POA: Diagnosis present

## 2022-03-07 HISTORY — DX: Transient cerebral ischemic attack, unspecified: G45.9

## 2022-03-07 LAB — ECHOCARDIOGRAM COMPLETE
Area-P 1/2: 5.27 cm2
Height: 72 in
S' Lateral: 3.5 cm
Weight: 2698.43 oz

## 2022-03-07 LAB — HEMOGLOBIN A1C
Hgb A1c MFr Bld: 5.6 % (ref 4.8–5.6)
Mean Plasma Glucose: 114.02 mg/dL

## 2022-03-07 LAB — RAPID URINE DRUG SCREEN, HOSP PERFORMED
Amphetamines: NOT DETECTED
Barbiturates: NOT DETECTED
Benzodiazepines: NOT DETECTED
Cocaine: NOT DETECTED
Opiates: NOT DETECTED
Tetrahydrocannabinol: NOT DETECTED

## 2022-03-07 LAB — LIPID PANEL
Cholesterol: 134 mg/dL (ref 0–200)
HDL: 39 mg/dL — ABNORMAL LOW (ref >40)
LDL Cholesterol: 80 mg/dL (ref 0–99)
Total CHOL/HDL Ratio: 3.4 RATIO
Triglycerides: 77 mg/dL (ref <150)
VLDL: 15 mg/dL (ref 0–40)

## 2022-03-07 LAB — GLUCOSE, CAPILLARY
Glucose-Capillary: 141 mg/dL — ABNORMAL HIGH (ref 70–99)
Glucose-Capillary: 178 mg/dL — ABNORMAL HIGH (ref 70–99)
Glucose-Capillary: 160 mg/dL — ABNORMAL HIGH (ref 70–99)
Glucose-Capillary: 103 mg/dL — ABNORMAL HIGH (ref 70–99)

## 2022-03-07 LAB — SURGICAL PCR SCREEN
MRSA, PCR: NEGATIVE
Staphylococcus aureus: NEGATIVE

## 2022-03-07 LAB — HIV ANTIBODY (ROUTINE TESTING W REFLEX): HIV Screen 4th Generation wRfx: NONREACTIVE

## 2022-03-07 MED ORDER — CLEVIDIPINE BUTYRATE 0.5 MG/ML IV EMUL
0.0000 mg/h | INTRAVENOUS | Status: DC
  Filled 2022-03-07 (×2): qty 50

## 2022-03-07 MED ORDER — ACETAMINOPHEN 650 MG RE SUPP
650.0000 mg | RECTAL | Status: DC | PRN
  Filled 2022-03-07: qty 1

## 2022-03-07 MED ORDER — ALBUTEROL SULFATE (2.5 MG/3ML) 0.083% IN NEBU: 2.5000 mg | INHALATION_SOLUTION | Freq: Four times a day (QID) | RESPIRATORY_TRACT | Status: DC | PRN

## 2022-03-07 MED ORDER — HYDRALAZINE HCL 20 MG/ML IJ SOLN: 5.0000 mg | INTRAMUSCULAR | Status: DC | PRN

## 2022-03-07 MED ORDER — ACETAMINOPHEN 160 MG/5ML PO SOLN: 650.0000 mg | ORAL | Status: DC | PRN

## 2022-03-07 MED ORDER — CLOPIDOGREL BISULFATE 75 MG PO TABS
75.0000 mg | ORAL_TABLET | Freq: Every day | ORAL | Status: DC
  Administered 2022-03-07 – 2022-03-09 (×3): 75 mg via ORAL
  Filled 2022-03-07 (×3): qty 1

## 2022-03-07 MED ORDER — SODIUM CHLORIDE 0.9 % IV SOLN: INTRAVENOUS | Status: DC

## 2022-03-07 MED ORDER — CHLORHEXIDINE GLUCONATE CLOTH 2 % EX PADS
6.0000 | MEDICATED_PAD | Freq: Every day | CUTANEOUS | Status: DC
  Administered 2022-03-08: 6 via TOPICAL

## 2022-03-07 MED ORDER — INSULIN ASPART 100 UNIT/ML IJ SOLN
0.0000 [IU] | Freq: Three times a day (TID) | INTRAMUSCULAR | Status: DC
  Administered 2022-03-07 (×2): 2 [IU] via SUBCUTANEOUS
  Administered 2022-03-08: 1 [IU] via SUBCUTANEOUS
  Administered 2022-03-08: 3 [IU] via SUBCUTANEOUS
  Administered 2022-03-08 – 2022-03-09 (×3): 1 [IU] via SUBCUTANEOUS

## 2022-03-07 MED ORDER — LACTULOSE 10 GM/15ML PO SOLN
5.0000 g | Freq: Every day | ORAL | Status: DC
  Administered 2022-03-07 – 2022-03-09 (×3): 5 g via ORAL
  Filled 2022-03-07 (×3): qty 15

## 2022-03-07 MED ORDER — SENNOSIDES-DOCUSATE SODIUM 8.6-50 MG PO TABS: 1.0000 | ORAL_TABLET | Freq: Every evening | ORAL | Status: DC | PRN

## 2022-03-07 MED ORDER — VITAMIN D 25 MCG (1000 UNIT) PO TABS
2000.0000 [IU] | ORAL_TABLET | Freq: Every day | ORAL | Status: DC
  Administered 2022-03-07 – 2022-03-09 (×3): 2000 [IU] via ORAL
  Filled 2022-03-07 (×3): qty 2

## 2022-03-07 MED ORDER — VITAMIN B-12 1000 MCG PO TABS
1000.0000 ug | ORAL_TABLET | Freq: Every day | ORAL | Status: DC
  Administered 2022-03-07 – 2022-03-09 (×3): 1000 ug via ORAL
  Filled 2022-03-07 (×3): qty 1

## 2022-03-07 MED ORDER — STROKE: EARLY STAGES OF RECOVERY BOOK
Freq: Once | Status: AC
  Administered 2022-03-07: 1
  Filled 2022-03-07: qty 1

## 2022-03-07 MED ORDER — OXYCODONE HCL 5 MG PO TABS
15.0000 mg | ORAL_TABLET | Freq: Three times a day (TID) | ORAL | Status: DC | PRN
  Administered 2022-03-08: 15 mg via ORAL
  Filled 2022-03-07: qty 3

## 2022-03-07 MED ORDER — PANTOPRAZOLE SODIUM 40 MG PO TBEC
40.0000 mg | DELAYED_RELEASE_TABLET | Freq: Every day | ORAL | Status: DC
  Administered 2022-03-07 – 2022-03-08 (×2): 40 mg via ORAL
  Filled 2022-03-07 (×2): qty 1

## 2022-03-07 MED ORDER — ATORVASTATIN CALCIUM 10 MG PO TABS: 20.0000 mg | ORAL_TABLET | Freq: Every day | ORAL | Status: DC

## 2022-03-07 MED ORDER — ACETAMINOPHEN 325 MG PO TABS
650.0000 mg | ORAL_TABLET | ORAL | Status: DC | PRN
  Administered 2022-03-07: 650 mg via ORAL
  Filled 2022-03-07 (×2): qty 2

## 2022-03-07 MED ORDER — ATORVASTATIN CALCIUM 40 MG PO TABS
40.0000 mg | ORAL_TABLET | Freq: Every day | ORAL | Status: DC
  Administered 2022-03-07 – 2022-03-08 (×2): 40 mg via ORAL
  Filled 2022-03-07 (×2): qty 1

## 2022-03-07 MED ORDER — PANTOPRAZOLE SODIUM 40 MG IV SOLR
40.0000 mg | Freq: Every day | INTRAVENOUS | Status: DC
  Administered 2022-03-07: 40 mg via INTRAVENOUS
  Filled 2022-03-07: qty 10

## 2022-03-07 MED ORDER — ALBUTEROL SULFATE HFA 108 (90 BASE) MCG/ACT IN AERS
1.0000 | INHALATION_SPRAY | Freq: Four times a day (QID) | RESPIRATORY_TRACT | Status: DC | PRN
Start: 2022-03-07 — End: 2022-03-07

## 2022-03-07 MED ORDER — ASPIRIN 81 MG PO TBEC
81.0000 mg | DELAYED_RELEASE_TABLET | Freq: Every day | ORAL | Status: DC
  Administered 2022-03-07 – 2022-03-09 (×3): 81 mg via ORAL
  Filled 2022-03-07 (×3): qty 1

## 2022-03-07 NOTE — Evaluation (Signed)
Physical Therapy Evaluation Patient Details Name: Matthew Roman MRN: [id number] DOB: 08/24/1956 Today's Date: 03/07/2022  History of Present Illness  66 y.o. male presents to [facility] hospital 03/06/2022 with acute onset aphasia and diffuse weakness. CT and CTA negative, pt received TNK. PMH includes cirrhosis, COPD, DM, HTN, CVA.  Clinical Impression  Pt presents to PT with deficits in communication, cognition, balance, gait. Pt's caregivers report gait quality is not far from baseline, pt with expressive deficits limiting his ability to effectively communicate his feelings about current mobility status.  Pt is able to ambulate without physical assistance, balance improving with increased distances. Pt is encouraged to continue to mobilize with staff assistance as tolerated. PT recommends outpatient PT at this time, however pt may not require this at discharge if he continues to progress quickly.     Recommendations for follow up therapy are one component of a multi-disciplinary discharge planning process, led by the attending physician.  Recommendations may be updated based on patient status, additional functional criteria and insurance authorization.  Follow Up Recommendations Outpatient PT (may progress to no PT needs)    Assistance Recommended at Discharge Intermittent Supervision/Assistance  Patient can return home with the following  A little help with walking and/or transfers;A little help with bathing/dressing/bathroom;Assistance with cooking/housework;Direct supervision/assist for medications management;Direct supervision/assist for financial management;Assist for transportation;Help with stairs or ramp for entrance    Equipment Recommendations None recommended by PT  Recommendations for Other Services       Functional Status Assessment Patient has had a recent decline in their functional status and demonstrates the ability to make significant improvements in function in a reasonable and  predictable amount of time.     Precautions / Restrictions Precautions Precautions: Fall Precaution Comments: expressive aphasia Restrictions Weight Bearing Restrictions: No      Mobility  Bed Mobility Overal bed mobility: Needs Assistance Bed Mobility: Supine to Sit     Supine to sit: Supervision          Transfers Overall transfer level: Needs assistance Equipment used: None Transfers: Sit to/from Stand Sit to Stand: Supervision                Ambulation/Gait Ambulation/Gait assistance: Supervision Gait Distance (Feet): 120 Feet Assistive device: None Gait Pattern/deviations: Step-through pattern Gait velocity: functional Gait velocity interpretation: 1.31 - 2.62 ft/sec, indicative of limited community ambulator   General Gait Details: pt with increased lateral drift initially but becomes more steady with increased distances  Financial trader Rankin (Stroke Patients Only) Modified Rankin (Stroke Patients Only) Pre-Morbid Rankin Score: Slight disability Modified Rankin: Moderately severe disability     Balance Overall balance assessment: Needs assistance Sitting-balance support: No upper extremity supported, Feet supported Sitting balance-Leahy Scale: Good     Standing balance support: No upper extremity supported, During functional activity Standing balance-Leahy Scale: Good                               Pertinent Vitals/Pain Pain Assessment Pain Assessment: No/denies pain    Home Living Family/patient expects to be discharged to:: Private residence Living Arrangements: Non-relatives/Friends Available Help at Discharge: Other (Comment) (roommates/caregivers) Type of Home: House Home Access: Stairs to enter Entrance Stairs-Rails: Psychiatric nurse of Steps: 9   Home Layout: One level Home Equipment: Cane - single point      Prior Function Prior Level  of Function :  Independent/Modified Independent             Mobility Comments: pt utilizes a cane PRN, likes to walk the dog       Hand Dominance   Dominant Hand: Right    Extremity/Trunk Assessment   Upper Extremity Assessment Upper Extremity Assessment: Overall WFL for tasks assessed    Lower Extremity Assessment Lower Extremity Assessment: Overall WFL for tasks assessed    Cervical / Trunk Assessment Cervical / Trunk Assessment: Normal  Communication   Communication: Expressive difficulties  Cognition Arousal/Alertness: Awake/alert Behavior During Therapy: Impulsive Overall Cognitive Status: Impaired/Different from baseline Area of Impairment: Safety/judgement, Awareness                         Safety/Judgement: Decreased awareness of safety Awareness: Emergent            General Comments General comments (skin integrity, edema, etc.): VSS on RA    Exercises     Assessment/Plan    PT Assessment Patient needs continued PT services  PT Problem List Decreased balance;Decreased mobility;Decreased cognition;Decreased knowledge of precautions       PT Treatment Interventions DME instruction;Gait training;Stair training;Functional mobility training;Therapeutic activities;Therapeutic exercise;Balance training;Neuromuscular re-education;Patient/family education    PT Goals (Current goals can be found in the Care Plan section)  Acute Rehab PT Goals Patient Stated Goal: to return toward independence PT Goal Formulation: With patient Time For Goal Achievement: 03/21/22 Potential to Achieve Goals: Good    Frequency Min 3X/week     Co-evaluation               AM-PAC PT "6 Clicks" Mobility  Outcome Measure Help needed turning from your back to your side while in a flat bed without using bedrails?: A Little Help needed moving from lying on your back to sitting on the side of a flat bed without using bedrails?: A Little Help needed moving to and from a bed  to a chair (including a wheelchair)?: A Little Help needed standing up from a chair using your arms (e.g., wheelchair or bedside chair)?: A Little Help needed to walk in hospital room?: A Little Help needed climbing 3-5 steps with a railing? : A Little 6 Click Score: 18    End of Session   Activity Tolerance: Patient tolerated treatment well Patient left: in chair;with call bell/phone within reach;with chair alarm set Nurse Communication: Mobility status PT Visit Diagnosis: Other abnormalities of gait and mobility (R26.89);Other symptoms and signs involving the nervous system (R29.898)    Time: 1040-1107 PT Time Calculation (min) (ACUTE ONLY): 27 min   Charges:   PT Evaluation $PT Eval Low Complexity: [place], PT, DPT Acute Rehabilitation Office [phone]   Zenaida Niece 03/07/2022, 12:51 PM

## 2022-03-07 NOTE — Progress Notes (Signed)
Lower extremity venous attempted. Patient just transferred to chair. Will attempt in the morning.   Darlin Coco, RDMS, RVT

## 2022-03-07 NOTE — Progress Notes (Addendum)
OT Cancellation Note  Patient Details Name: Matthew Roman MRN: [id number] DOB: [birth year]   Cancelled Treatment:    Reason Eval/Treat Not Completed: Active bedrest order (Will return as schedule allows.)  [place], OTR/L Acute Rehab Office: [phone] 03/07/2022, 10:20 AM

## 2022-03-07 NOTE — TOC CAGE-AID Note (Signed)
Transition of Care [facility]) - CAGE-AID Screening   Patient Details  Name: Beuford Garcilazo MRN: [id number] Date of Birth: Feb 07, 1956  Transition of Care [facility]) CM/SW Contact:    Gaetano Hawthorne Tarpley-Carter, [place] Phone Number: 03/07/2022, 12:44 PM   Clinical Narrative: Pt participated in [place].  Per pts family stated he does smoke cigarettes.  Pt was offered resources, due to smoking cigarettes.     Heru Montz Tarpley-Carter, MSW, LCSW-A Pronouns:  She/Her/Hers [facility] HealthTransitions of Care Clinical Social Worker Direct Number:  [phone] [email or web address]  CAGE-AID Screening:    Have You Ever Felt You Ought to Cut Down on Your Drinking or Drug Use?: Yes Have People Annoyed You By [employer] Your Drinking Or Drug Use?: Yes Have You Felt Bad Or Guilty About Your Drinking Or Drug Use?: No Have You Ever Had a Drink or Used Drugs First Thing In The Morning to Steady Your Nerves or to Get Rid of a Hangover?: No CAGE-AID Score: 2  Substance Abuse Education Offered: Yes  Substance abuse interventions: Scientist, clinical (histocompatibility and immunogenetics)

## 2022-03-07 NOTE — Plan of Care (Signed)
  Problem: Education: Goal: Knowledge of disease or condition will improve Outcome: Progressing Goal: Knowledge of secondary prevention will improve (SELECT ALL) Outcome: Progressing Goal: Knowledge of patient specific risk factors will improve (INDIVIDUALIZE FOR PATIENT) Outcome: Progressing Goal: Individualized Educational Video(s) Outcome: Progressing   

## 2022-03-07 NOTE — Progress Notes (Addendum)
STROKE TEAM PROGRESS NOTE   INTERVAL HISTORY His family and caregiver is at the bedside.  He is awake and alert sitting up in the bed. Patient is confused, unable to state reason for hospitalization, place, age, month or year. Able to state birth date. Able to identify 1 out of 2 visitors in room. Per family has mild cognitive issues at baseline. Follows simple commands, not able to do complex commands, cannot do a thumbs up, some confusion with follow commands. Mild perseveration. EOMI, Visual fields full, Bilateral arms 5/5, no ataxia. Bilateral legs 5/5. Sensation normal.Speech is clear, has global aphasia, able to get minimal words out.  Wiggles toes bilateral. Speech seems to be a little improved today per family at bedside.  MRI 24 hrs post TNK tonight @ 1800. If no hemorrhage on imaging will start ASA/ Plavix tonight.   Vitals:   03/07/22 0530 03/07/22 0600 03/07/22 0630 03/07/22 0700  BP: (!) 167/97 (!) 163/95 (!) 144/81 (!) 158/81  Pulse: 79 (!) 105 (!) 110 83  Resp: (!) 23 (!) 28 18 (!) 22  Temp:      TempSrc:      SpO2: 96% 95% 92% 96%  Weight:      Height:       CBC:  Recent Labs  Lab 03/06/22 2108 03/06/22 2115  WBC 5.9  --   NEUTROABS 3.9  --   HGB 17.3* 17.0  HCT 51.0 50.0  MCV 90.6  --   PLT 112*  --    Basic Metabolic Panel:  Recent Labs  Lab 03/06/22 2108 03/06/22 2115  NA 142 143  K 4.2 4.2  CL 107 105  CO2 26  --   GLUCOSE 90 87  BUN 11 12  CREATININE 1.08 1.00  CALCIUM 9.3  --    Lipid Panel:  Recent Labs  Lab 03/07/22 0419  CHOL 134  TRIG 77  HDL 39*  CHOLHDL 3.4  VLDL 15  LDLCALC 80   HgbA1c:  Recent Labs  Lab 03/07/22 0419  HGBA1C 5.6   Alcohol Level  Recent Labs  Lab 03/06/22 2108  ETH <10    IMAGING past 24 hours CT ANGIO HEAD NECK W WO CM (CODE STROKE)  Result Date: 03/06/2022 CLINICAL DATA:  Initial evaluation for neuro deficit, stroke suspected. EXAM: CT ANGIOGRAPHY HEAD AND NECK TECHNIQUE: Multidetector CT imaging of  the head and neck was performed using the standard protocol during bolus administration of intravenous contrast. Multiplanar CT image reconstructions and MIPs were obtained to evaluate the vascular anatomy. Carotid stenosis measurements (when applicable) are obtained utilizing NASCET criteria, using the distal internal carotid diameter as the denominator. RADIATION DOSE REDUCTION: This exam was performed according to the departmental dose-optimization program which includes automated exposure control, adjustment of the mA and/or kV according to patient size and/or use of iterative reconstruction technique. CONTRAST:  77m OMNIPAQUE IOHEXOL 350 MG/ML SOLN COMPARISON:  Prior CT from earlier the same day. FINDINGS: CTA NECK FINDINGS Aortic arch: Visualized aortic arch normal caliber with standard branching pattern. Atheromatous change about the arch and origin the great vessels without significant stenosis. Right carotid system: Right CCA patent without stenosis. Eccentric calcified plaque about the right carotid bulb without hemodynamically greater than 50% stenosis. Right ICA patent distally without stenosis or dissection. Left carotid system: Left CCA patent without stenosis. Mild eccentric plaque about the left carotid bulb/proximal left ICA without significant stenosis. Left ICA patent distally without stenosis or dissection. Vertebral arteries: Both vertebral arteries arise from  subclavian arteries. Right vertebral artery dominant. Vertebral arteries patent without stenosis or dissection. Skeleton: No discrete or worrisome osseous lesions. Moderate spondylosis present at C6-7. Patient is edentulous. Other neck: No other acute soft tissue abnormality within the neck. Probable small sebaceous cyst noted at the left face. Upper chest: Emphysema. 3 mm left upper lobe nodule (series 6, image 186), indeterminate. Review of the MIP images confirms the above findings CTA HEAD FINDINGS Anterior circulation: Petrous  segments patent bilaterally. Atheromatous change within the carotid siphons with no more than mild multifocal narrowing. A1 segments patent. Normal anterior communicating complex. Anterior cerebral arteries patent without significant stenosis. No M1 stenosis or occlusion. No proximal MCA branch occlusion. Distal MCA branches perfused and symmetric. Diffuse distal small vessel atheromatous irregularity. Posterior circulation: Dominant right V4 segment patent to the vertebrobasilar junction without significant stenosis. Hypoplastic left vertebral artery largely terminates in PICA, although a tiny branch ascending towards the vertebrobasilar junction. Both PICA patent. Basilar patent to its distal aspect without stenosis. Superior cerebellar arteries patent bilaterally. Atheromatous irregularity throughout the PCAs bilaterally. Superimposed severe distal left P3 stenosis noted (series 13, image 23). No proximal high-grade stenosis. Venous sinuses: Grossly patent allowing for timing the contrast bolus. Anatomic variants: Dominant right vertebral artery.  No aneurysm. Review of the MIP images confirms the above findings IMPRESSION: 1. Negative CTA for large vessel occlusion or other emergent finding. 2. Mild to moderate intracranial atherosclerotic disease. Associated severe distal left P3 stenosis. No proximal high-grade or correctable stenosis. 3. Atheromatous change about the carotid bifurcations without significant stenosis. No hemodynamically significant stenosis within the neck. 4.  Emphysema (ICD10-J43.9). 5. 3 mm left upper lobe nodule, indeterminate. A non-contrast Chest CT at 12 months is optional. If performed and the nodule is stable at 12 months, no further follow-up is recommended. These guidelines do not apply to immunocompromised patients and patients with cancer. Follow up in patients with significant comorbidities as clinically warranted. For lung cancer screening, adhere to Lung-RADS guidelines.  Reference: Radiology. 2017; 284(1):228-43. Results discussed by telephone at the time of interpretation on 03/06/2022 at 10:13 pm to provider ERIC [facility] , who verbally acknowledged these results. Electronically Signed   By: Jeannine Boga M.D.   On: 03/06/2022 22:32   CT HEAD CODE STROKE WO CONTRAST  Result Date: 03/06/2022 CLINICAL DATA:  Code stroke. Initial evaluation for neuro deficit, stroke suspected, aphasia, left hand weakness. EXAM: CT HEAD WITHOUT CONTRAST TECHNIQUE: Contiguous axial images were obtained from the base of the skull through the vertex without intravenous contrast. RADIATION DOSE REDUCTION: This exam was performed according to the departmental dose-optimization program which includes automated exposure control, adjustment of the mA and/or kV according to patient size and/or use of iterative reconstruction technique. COMPARISON:  Prior CT from 07/27/2020. FINDINGS: Brain: Age-related cerebral atrophy with chronic microvascular ischemic disease, progressed from prior. Few scattered remote lacunar infarcts present about the left greater than right basal ganglia and thalami. No acute intracranial hemorrhage. No acute large vessel territory infarct. No mass lesion or midline shift. Ex vacuo dilatation left lateral ventricle related to the chronic left basal ganglia infarcts without hydrocephalus. No extra-axial fluid collection. Vascular: No hyperdense vessel. Calcified atherosclerosis present at skull base. Skull: Scalp soft tissues within normal limits.  Calvarium intact. Sinuses/Orbits: Globes orbital soft tissues demonstrate no acute finding. Paranasal sinuses are largely clear. No significant mastoid effusion. Other: None. ASPECTS [facility] Stroke Program Early CT Score) - Ganglionic level infarction (caudate, lentiform nuclei, internal capsule, insula, M1-M3 cortex): 7 -  Supraganglionic infarction (M4-M6 cortex): 3 Total score (0-10 with 10 being normal): 10 IMPRESSION: 1. No acute  intracranial abnormality. 2. ASPECTS is 10. 3. Age-related cerebral atrophy with moderate to advanced chronic microvascular ischemic disease, with multiple scattered remote lacunar infarcts as above. These results were communicated to Dr. Cheral Marker at 9:18 pm on 03/06/2022 by text page via the [facility] messaging system. Electronically Signed   By: Jeannine Boga M.D.   On: 03/06/2022 21:20    PHYSICAL EXAM General: Awake alert, elderly male in NAD  Neuro: He is awake and alert sitting up in the bed. Patient is confused, unable to state reason for hospitalization, place, age, month or year. Able to state birth date. Able to identify 1 out of 2 visitors in room. Per family has mild cognitive issues at baseline. Follows simple commands, not able to do complex commands, cannot do a thumbs up, some confusion with follow commands. Mild perseveration. EOMI, Visual fields full, Bilateral arms 5/5, no ataxia. Bilateral legs 5/5. Sensation normal.Speech is clear, has global aphasia, able to get minimal words out.  Wiggles toes bilateral.  ASSESSMENT/PLAN Mr. Gavyn Ybarra is a 66 y.o. male with history of current smoker with a PMHx of hepatitis and cirrhosis, COPD, DM, HTN and prior strokes who presents to the ED with acute onset of aphasia. LKN was 7:45 PM.   Stroke:  Acute Left MCA ischemic infarct secondary to unclear source Code Stroke CT head No acute abnormality. ASPECTS 10.    CTA head & neck no LVO, left P3 severe stenosis. MRI  ordered  2D Echo ordered US lowers ordered  LDL 80 HgbA1c 5.6 UDS negative VTE prophylaxis - SCD's Aspirin 81 prior to admission, now on No antithrombotic. Will start ASA and Plavix after 24 hr CT head obtained with no hemorrhage  Therapy recommendations: Outpatient Disposition:  pending   History of stroke 08/2018 admitted for altered mental status, speech difficulty after fall causing right hip fracture.  CT showed chronic lacunar infarct within bilateral thalamus  and right BG.  MRI showed multifocal infarct at bilateral frontal and parietal area.  CT head and neck unremarkable.  Carotid Doppler unremarkable.  EF 50 to 55%.  DVT negative.  LDL 128, A1c 5.3, UDS negative.  Right hip status post surgery.  Etiology for patient stroke not quite clear concerning for fat emboli versus cardioembolic source.  Recommend 30-day CardioNet monitoring as outpatient which was not done yet.  Discharged on aspirin and Lipitor 20.  Hypertension Home meds:  none  Stable Permissive hypertension (OK if < 180/105 post TNK) and then gradually normalize in 5-7 days Long-term BP goal normotensive  Hyperlipidemia Home meds:  atorvastatin '20mg'$   LDL 80, goal < 70 Increase Lipitor to 40 Continue statin at discharge  Diabetes type II Controlled Home meds:  metformin 500 mg XR BID  HgbA1c 5.6, goal < 7.0 CBGs SSI  Tobacco abuse Current smoker Smoking cessation counseling provided Pt is willing to quit  Other Stroke Risk Factors Advanced Age >/= 46 Hx stroke/TIA   Other Active Problems COPD  Albuterol inhaler PRN  Cirrhosis   Continue home lactulose, AST/ALT normal  Hospital day # 0  Beulah Gandy DNP, ACNPC-AG   ATTENDING NOTE: I reviewed above note and agree with the assessment and plan. Pt was seen and examined.   66 year old male with history of diabetes, hypertension, hyperlipidemia, smoker, hepatitis, cirrhosis admitted for aphasia and generalized weakness.  CT no acute abnormality.  Status post TNK.  CT head and  neck showed no LVO, left P3 severe stenosis.  MRI pending, EF 60 to 65%, LE venous Doppler pending.  UDS negative, LDL 80, A1c 5.6.  AST/ALT 23/34.  In 08/2018 patient was admitted for AMS, speech difficulty after fall causing right hip fracture.  CT showed chronic lacunar infarct within bilateral thalamus and right BG.  MRI showed multifocal infarct at bilateral frontal and parietal area.  CT head and neck unremarkable.  Carotid Doppler  unremarkable.  EF 50 to 55%.  DVT negative.  LDL 128, A1c 5.3, UDS negative.  Right hip status post surgery.  Etiology for patient stroke not quite clear concerning for fat emboli versus cardioembolic source.  Recommend 30-day CardioNet monitoring as outpatient which was not done yet.  Discharged on aspirin and Lipitor 20.  On exam, patient caregiver at bedside.  Patient awake alert, following most simple commands, however still has expressive aphasia, not able to finish sentence, paraphasic errors and hesitancy of speech.  Significant perseveration.  However, visual field full, no significant facial droop, no gaze palsy, bilateral upper and lower extremity strength equal.  Sensation symmetrical, bilateral finger-nose grossly intact.  Etiology for patient stroke not quite clear.  Patient last stroke in 08/2018 recommend 30-day cardiac event monitor as outpatient to rule out A-fib but never done.  Recommend loop recorder at this time if stroke work-up unrevealing.  Once 24 hours after TNK, neuroimaging no bleeding, recommend DAPT for 3 weeks and then Plavix alone.  Increase Lipitor from 20-40.  Patient passed swallow on diet.  PT started recommend outpatient PT/OT.  For detailed assessment and plan, please refer to above/below as I have made changes wherever appropriate.   Rosalin Hawking, MD PhD Stroke Neurology 03/07/2022 6:25 PM  This patient is critically ill due to acute stroke, stat post TNK, history of stroke and at significant risk of neurological worsening, death form recurrent stroke, hemorrhagic transformation, bleeding from TNK. This patient's care requires constant monitoring of vital signs, hemodynamics, respiratory and cardiac monitoring, review of multiple databases, neurological assessment, discussion with family, other specialists and medical decision making of high complexity. I spent 35 minutes of neurocritical care time in the care of this patient. I had long discussion with caregiver at  bedside, updated pt current condition, treatment plan and potential prognosis, and answered all the questions.  She expressed understanding and appreciation.      To contact Stroke Continuity provider, please refer to [email or web address] After hours, contact General Neurology

## 2022-03-07 NOTE — Progress Notes (Signed)
Called to room by pt family. Pt stating he "can't see". Due to aphasia, pt cannot elaborate. No other deficits noted. Provider notified. Will take for Stat CTH, defer MRI till night shift.

## 2022-03-07 NOTE — ED Notes (Signed)
Patient attempted to use urinal but spilled it all over his bed. This RN was able to change patient's linens and provided patient with clean gown.

## 2022-03-07 NOTE — Progress Notes (Signed)
  Echocardiogram 2D Echocardiogram has been performed.  Matthew Roman 03/07/2022, 2:40 PM

## 2022-03-08 ENCOUNTER — Inpatient Hospital Stay (HOSPITAL_COMMUNITY): Payer: No Typology Code available for payment source

## 2022-03-08 DIAGNOSIS — R569 Unspecified convulsions: Secondary | ICD-10-CM

## 2022-03-08 DIAGNOSIS — I639 Cerebral infarction, unspecified: Secondary | ICD-10-CM | POA: Diagnosis not present

## 2022-03-08 DIAGNOSIS — I63412 Cerebral infarction due to embolism of left middle cerebral artery: Secondary | ICD-10-CM | POA: Diagnosis not present

## 2022-03-08 LAB — CBC
HCT: 43.9 % (ref 39.0–52.0)
Hemoglobin: 14.8 g/dL (ref 13.0–17.0)
MCH: 29.8 pg (ref 26.0–34.0)
MCHC: 33.7 g/dL (ref 30.0–36.0)
MCV: 88.3 fL (ref 80.0–100.0)
Platelets: 83 10*3/uL — ABNORMAL LOW (ref 150–400)
RBC: 4.97 MIL/uL (ref 4.22–5.81)
RDW: 13.9 % (ref 11.5–15.5)
WBC: 6 10*3/uL (ref 4.0–10.5)
nRBC: 0 % (ref 0.0–0.2)

## 2022-03-08 LAB — URINALYSIS, ROUTINE W REFLEX MICROSCOPIC
Bacteria, UA: NONE SEEN
Bilirubin Urine: NEGATIVE
Glucose, UA: NEGATIVE mg/dL
Ketones, ur: NEGATIVE mg/dL
Leukocytes,Ua: NEGATIVE
Nitrite: NEGATIVE
Protein, ur: NEGATIVE mg/dL
Specific Gravity, Urine: 1.01 (ref 1.005–1.030)
pH: 6 (ref 5.0–8.0)

## 2022-03-08 LAB — AMMONIA: Ammonia: 19 umol/L (ref 9–35)

## 2022-03-08 LAB — GLUCOSE, CAPILLARY
Glucose-Capillary: 130 mg/dL — ABNORMAL HIGH (ref 70–99)
Glucose-Capillary: 216 mg/dL — ABNORMAL HIGH (ref 70–99)
Glucose-Capillary: 149 mg/dL — ABNORMAL HIGH (ref 70–99)
Glucose-Capillary: 138 mg/dL — ABNORMAL HIGH (ref 70–99)

## 2022-03-08 NOTE — Evaluation (Signed)
Occupational Therapy Evaluation Patient Details Name: Matthew Roman MRN: [id number] DOB: [birth year] Today's Date: 03/08/2022   History of Present Illness 66 y.o. male presents to [facility] hospital 03/06/2022 with acute onset aphasia and diffuse weakness. CT and CTA negative, pt received TNK. PMH includes cirrhosis, COPD, DM, HTN, CVA.   Clinical Impression   Matthew Roman was evaluated s/p the above admission list, he is generally indep at baseline with intermittent use of SPC. Upon arrival, pt had urgent toileting needs. He was mod I for be mobility and supervision A for transfers, ambulation and toileting for safety only, without DME. Overall pt noted to be mildly unsteady and a bit impulsive, however he was easily re-directed and followed all cues for safety. OT to continue to follow acutely to progress back to independence. Anticipate no OT follow up needs and d/c back home.      Recommendations for follow up therapy are one component of a multi-disciplinary discharge planning process, led by the attending physician.  Recommendations may be updated based on patient status, additional functional criteria and insurance authorization.   Follow Up Recommendations  No OT follow up    Assistance Recommended at Discharge Intermittent Supervision/Assistance  Patient can return home with the following Assist for transportation    Functional Status Assessment  Patient has had a recent decline in their functional status and demonstrates the ability to make significant improvements in function in a reasonable and predictable amount of time.  Equipment Recommendations  None recommended by OT       Precautions / Restrictions Precautions Precautions: Fall Restrictions Weight Bearing Restrictions: No      Mobility Bed Mobility Overal bed mobility: Needs Assistance Bed Mobility: Supine to Sit     Supine to sit: Modified independent (Device/Increase time)          Transfers Overall transfer level:  Needs assistance Equipment used: None Transfers: Sit to/from Stand Sit to Stand: Supervision           General transfer comment: a little unsteady upon standing      Balance Overall balance assessment: Needs assistance Sitting-balance support: No upper extremity supported, Feet supported Sitting balance-Leahy Scale: Good     Standing balance support: No upper extremity supported, During functional activity Standing balance-Leahy Scale: Fair                             ADL either performed or assessed with clinical judgement   ADL Overall ADL's : Needs assistance/impaired Eating/Feeding: Independent;Sitting   Grooming: Supervision/safety;Standing   Upper Body Bathing: Set up;Sitting   Lower Body Bathing: Sit to/from stand;Supervison/ safety   Upper Body Dressing : Set up;Sitting   Lower Body Dressing: Supervision/safety;Sit to/from stand   Toilet Transfer: Supervision/safety;Ambulation;Regular Toilet   Toileting- Clothing Manipulation and Hygiene: Supervision/safety;Sitting/lateral lean       Functional mobility during ADLs: Supervision/safety General ADL Comments: pt had urgent BM needs, noted to be impulsive but easily re-directed. supervision provided throughout for safety and multiple lines (catheter, EEG, tele, O2)     Vision Baseline Vision/History: 0 No visual deficits Vision Assessment?: No apparent visual deficits            Pertinent Vitals/Pain Pain Assessment Pain Assessment: No/denies pain     Hand Dominance Right   Extremity/Trunk Assessment Upper Extremity Assessment Upper Extremity Assessment: Overall WFL for tasks assessed   Lower Extremity Assessment Lower Extremity Assessment: Overall WFL for tasks assessed   Cervical / Trunk  Assessment Cervical / Trunk Assessment: Normal   Communication Communication Communication: No difficulties   Cognition Arousal/Alertness: Awake/alert Behavior During Therapy:  Impulsive Overall Cognitive Status: Impaired/Different from baseline Area of Impairment: Safety/judgement, Awareness                         Safety/Judgement: Decreased awareness of safety Awareness: Emergent   General Comments: alert and oriented, no speech difficulties noted today. Impulsive with movements but easily re-directed and cued for safety.     General Comments  VSS on RA, recieving 20 minute EEG upon arrival            [place] expects to be discharged to:: Private residence Living Arrangements: Non-relatives/Friends Available Help at Discharge: Other (Comment) Type of Home: House Home Access: Stairs to enter [employer] of Steps: 9 Entrance Stairs-Rails: Right;Left Home Layout: One level     Bathroom Shower/Tub: Teacher, early years/pre: Standard Bathroom Accessibility: Yes   Home Equipment: Cane - single point          Prior Functioning/Environment Prior Level of Function : Independent/Modified Independent             Mobility Comments: pt utilizes a cane PRN, likes to walk the dog          OT Problem List: Decreased activity tolerance;Impaired balance (sitting and/or standing);Decreased safety awareness      OT Treatment/Interventions: Self-care/ADL training;Therapeutic exercise;DME and/or AE instruction;Patient/family education;Balance training;Therapeutic activities    OT Goals(Current goals can be found in the care plan section) Acute Rehab OT Goals Patient Stated Goal: home OT Goal Formulation: With patient Time For Goal Achievement: 03/22/22 Potential to Achieve Goals: Good ADL Goals Additional ADL Goal #1: Pt will complete all BADLs indep Additional ADL Goal #2: Pt will indep complete IADL medicaion management task  OT Frequency: Min 2X/week       AM-PAC OT "6 Clicks" Daily Activity     Outcome Measure Help from another person eating meals?: None Help from another person taking  care of personal grooming?: A Little Help from another person toileting, which includes using toliet, bedpan, or urinal?: A Little Help from another person bathing (including washing, rinsing, drying)?: A Little Help from another person to put on and taking off regular upper body clothing?: A Little Help from another person to put on and taking off regular lower body clothing?: A Little 6 Click Score: 19   End of Session Nurse Communication: Mobility status  Activity Tolerance: Patient tolerated treatment well Patient left: in chair;with call bell/phone within reach;with chair alarm set  OT Visit Diagnosis: Other abnormalities of gait and mobility (R26.89)                Time: 7829-5621 OT Time Calculation (min): 19 min Charges:  OT General Charges $OT Visit: 1 Visit OT Evaluation $OT Eval Moderate Complexity: 1 Mod   Lakayla Barrington A Parker Wherley 03/08/2022, 10:25 AM

## 2022-03-08 NOTE — Progress Notes (Addendum)
STROKE TEAM PROGRESS NOTE   INTERVAL HISTORY He is awake and alert sitting up in bed. RN at bedside. No family at bedside Will obtain EEG. EEG tech at bedside to connect to eeg to check for any seizures. EEg results with  intermittent slowing left temporal region, no seizures identified. Patient is confused, He is oriented to person and place. Speech with mild dysarthria. Only able to recall 1-2 animals that have 4 legs. He is able to follow simple commands.  EOMI, Visual fields full to finger count. No weakness, 5/5 in all 4 extremities. Alternating movements in tact.  ASA/Plavix has been started for stroke prevention.CXR no acute process. Ammonia 19.  UA negative.  SLP for cognitive assessment  Granddaughter updated at bedside.   Vitals:   03/08/22 0800 03/08/22 0900 03/08/22 1000 03/08/22 1139  BP: (!) 144/90 119/78 (!) 145/102 (!) 141/79  Pulse: 62 69 68 (!) 57  Resp: '20 20 19 '$ (!) 21  Temp: 98.1 F (36.7 C)   97.7 F (36.5 C)  TempSrc: Oral   Oral  SpO2: 95% 95% 95% 96%  Weight:      Height:       CBC:  Recent Labs  Lab 03/06/22 2108 03/06/22 2115 03/08/22 0919  WBC 5.9  --  6.0  NEUTROABS 3.9  --   --   HGB 17.3* 17.0 14.8  HCT 51.0 50.0 43.9  MCV 90.6  --  88.3  PLT 112*  --  83*   Basic Metabolic Panel:  Recent Labs  Lab 03/06/22 2108 03/06/22 2115  NA 142 143  K 4.2 4.2  CL 107 105  CO2 26  --   GLUCOSE 90 87  BUN 11 12  CREATININE 1.08 1.00  CALCIUM 9.3  --    Lipid Panel:  Recent Labs  Lab 03/07/22 0419  CHOL 134  TRIG 77  HDL 39*  CHOLHDL 3.4  VLDL 15  LDLCALC 80   HgbA1c:  Recent Labs  Lab 03/07/22 0419  HGBA1C 5.6   Alcohol Level  Recent Labs  Lab 03/06/22 2108  ETH <10    IMAGING past 24 hours VAS [place] LOWER EXTREMITY VENOUS (DVT)  Result Date: 03/08/2022  Lower Venous DVT Study Patient Name:  MELQUAN ERNSBERGER  Date of Exam:   03/08/2022 Medical Rec #: [id number]       Accession #:    [id number] Date of Birth: 05-03-1956       Patient  Gender: M Patient Age:   66 years Exam Location:  [facility] Procedure:      VAS [place] LOWER EXTREMITY VENOUS (DVT) Referring Phys: Cornelius Moras XU --------------------------------------------------------------------------------  Indications: Stroke.  Comparison Study: Prior negative LEV done 09/05/18 Performing Technologist: Sharion Dove RVS  Examination Guidelines: A complete evaluation includes B-mode imaging, spectral Doppler, color Doppler, and power Doppler as needed of all accessible portions of each vessel. Bilateral testing is considered an integral part of a complete examination. Limited examinations for reoccurring indications may be performed as noted. The reflux portion of the exam is performed with the patient in reverse Trendelenburg.  +---------+---------------+---------+-----------+----------+--------------+ RIGHT    CompressibilityPhasicitySpontaneityPropertiesThrombus Aging +---------+---------------+---------+-----------+----------+--------------+ CFV      Full           Yes      Yes                                 +---------+---------------+---------+-----------+----------+--------------+ SFJ  Full                                                        +---------+---------------+---------+-----------+----------+--------------+ FV Prox  Full                                                        +---------+---------------+---------+-----------+----------+--------------+ FV Mid   Full                                                        +---------+---------------+---------+-----------+----------+--------------+ FV DistalFull                                                        +---------+---------------+---------+-----------+----------+--------------+ PFV      Full                                                        +---------+---------------+---------+-----------+----------+--------------+ POP      Full           Yes      Yes                                  +---------+---------------+---------+-----------+----------+--------------+ PTV      Full                                                        +---------+---------------+---------+-----------+----------+--------------+ PERO     Full                                                        +---------+---------------+---------+-----------+----------+--------------+   +---------+---------------+---------+-----------+----------+--------------+ LEFT     CompressibilityPhasicitySpontaneityPropertiesThrombus Aging +---------+---------------+---------+-----------+----------+--------------+ CFV      Full           Yes      Yes                                 +---------+---------------+---------+-----------+----------+--------------+ SFJ      Full                                                        +---------+---------------+---------+-----------+----------+--------------+  FV Prox  Full                                                        +---------+---------------+---------+-----------+----------+--------------+ FV Mid   Full                                                        +---------+---------------+---------+-----------+----------+--------------+ FV DistalFull                                                        +---------+---------------+---------+-----------+----------+--------------+ PFV      Full                                                        +---------+---------------+---------+-----------+----------+--------------+ POP      Full           Yes      Yes                                 +---------+---------------+---------+-----------+----------+--------------+ PTV      Full                                                        +---------+---------------+---------+-----------+----------+--------------+ PERO     Full                                                         +---------+---------------+---------+-----------+----------+--------------+     Summary: BILATERAL: - No evidence of deep vein thrombosis seen in the lower extremities, bilaterally. -No evidence of popliteal cyst, bilaterally.   *See table(s) above for measurements and observations.    Preliminary    EEG adult  Result Date: 03/08/2022 Lora Havens, MD     03/08/2022 10:41 AM Patient Name: Mariel Gaudin MRN: [id number] Epilepsy Attending: Lora Havens Referring Physician/Provider: August Albino, NP Date: 03/08/2022 Duration: 26.07 mins Patient history: 66yo M underwent eeg to evaluate for seizure Level of alertness: Awake AEDs during EEG study: None Technical aspects: This EEG study was done with scalp electrodes positioned according to the 10-20 International system of electrode placement. Electrical activity was acquired at a sampling rate of '500Hz'$  and reviewed with a high frequency filter of '70Hz'$  and a low frequency filter of '1Hz'$ . EEG data were recorded continuously and digitally stored. Description: The posterior dominant rhythm consists of 8-9 Hz activity of moderate voltage (25-35 uV) seen predominantly in posterior head regions, symmetric  and reactive to eye opening and eye closing. EEG showed intermittent 3 to 6 Hz theta-delta slowing in left temporal region. Physiologic photic driving was seen during photic stimulation.  Hyperventilation was not performed.   ABNORMALITY - Intermittent slow, left temporal region IMPRESSION: This study is suggestive of cortical dysfunction arising from left temporal region, non specific etiology but could be secondary to underlying stroke. No seizures or epileptiform discharges were seen throughout the recording. Priyanka Barbra Sarks   DG CHEST PORT 1 VIEW  Result Date: 03/08/2022 CLINICAL DATA:  Altered mental status EXAM: PORTABLE CHEST 1 VIEW COMPARISON:  07/27/2020 and prior studies FINDINGS: There is no evidence of focal airspace disease, pulmonary edema,  suspicious pulmonary nodule/mass, pleural effusion, or pneumothorax. No acute bony abnormalities are identified. IMPRESSION: No active disease. Electronically Signed   By: Margarette Canada M.D.   On: 03/08/2022 10:05   MR BRAIN WO CONTRAST  Result Date: 03/07/2022 CLINICAL DATA:  Follow-up examination for stroke. EXAM: MRI HEAD WITHOUT CONTRAST TECHNIQUE: Multiplanar, multiecho pulse sequences of the brain and surrounding structures were obtained without intravenous contrast. COMPARISON:  Or prior CT from earlier the same day as well as earlier studies. FINDINGS: Brain: Age-related cerebral atrophy. Patchy and confluent T2/FLAIR hyperintensity involving the periventricular and deep white matter both cerebral hemispheres most consistent with chronic small vessel ischemic disease, moderate to advanced in nature. Patchy involvement of the pons noted. Multiple scattered superimposed remote lacunar infarcts present about the hemispheric cerebral white matter, left greater than right basal ganglia, and thalami. No evidence for acute or subacute infarct. Note made of a punctate focus of diffusion signal abnormality involving the posterior right frontal periventricular white matter on axial DWI sequence image 34, felt to be related to adjacent focus of susceptibility artifact. Gray-white matter differentiation maintained. No other areas of chronic cortical infarction. No acute intracranial hemorrhage. Single chronic microhemorrhage at the posterior right frontal region, likely small vessel/hypertensive in nature. No mass lesion, midline shift or mass effect. No hydrocephalus or extra-axial fluid collection. Pituitary gland suprasellar region within normal limits. Vascular: Major intracranial vascular flow voids are maintained. Skull and upper cervical spine: Craniocervical junction within normal limits. Bone marrow signal intensity normal. No scalp soft tissue abnormality. Sinuses/Orbits: Globes and orbital soft tissues  within normal limits. Mild scattered mucosal thickening noted throughout the ethmoidal air cells and maxillary sinuses. No mastoid effusion. Other: Probable 1.6 cm sebaceous cyst noted within the subcutaneous fat of the left face. IMPRESSION: 1. No acute intracranial abnormality. 2. Age-related cerebral atrophy with moderate to advanced chronic microvascular ischemic disease, with multiple superimposed remote lacunar infarcts involving the hemispheric cerebral white matter, left greater than right basal ganglia, and thalami. Electronically Signed   By: Jeannine Boga M.D.   On: 03/07/2022 23:26   CT HEAD WO CONTRAST (5MM)  Result Date: 03/07/2022 CLINICAL DATA:  Stroke, follow-up, patient complaining of visual changes EXAM: CT HEAD WITHOUT CONTRAST TECHNIQUE: Contiguous axial images were obtained from the base of the skull through the vertex without intravenous contrast. RADIATION DOSE REDUCTION: This exam was performed according to the departmental dose-optimization program which includes automated exposure control, adjustment of the mA and/or kV according to patient size and/or use of iterative reconstruction technique. COMPARISON:  03/06/2022 FINDINGS: Brain: No evidence of acute infarction, hemorrhage, cerebral edema, mass, mass effect, or midline shift. No hydrocephalus or extra-axial fluid collection. Periventricular white matter changes, likely the sequela of chronic small vessel ischemic disease. Redemonstrated lacunar infarcts in the left-greater-than-right basal ganglia and  thalami. Ex vacuo dilatation of the left lateral ventricle, unchanged. Vascular: No hyperdense vessel. Atherosclerotic calcifications in the intracranial carotid and vertebral arteries. Skull: Normal. Negative for fracture or focal lesion. Sinuses/Orbits: Mild mucosal thickening in the ethmoid air cells. Otherwise clear. The orbits are unremarkable. Other: The mastoid air cells are well aerated. IMPRESSION: No acute  intracranial process. Electronically Signed   By: Merilyn Baba M.D.   On: 03/07/2022 18:59   ECHOCARDIOGRAM COMPLETE  Result Date: 03/07/2022    ECHOCARDIOGRAM REPORT   Patient Name:   FIELDS OROS Date of Exam: 03/07/2022 Medical Rec #:  [id number]      Height:       72.0 in Accession #:    [id number]     Weight:       168.7 lb Date of Birth:  March 02, 1956      BSA:          1.982 m Patient Age:    67 years       BP:           135/58 mmHg Patient Gender: M              HR:           58 bpm. Exam Location:  Inpatient Procedure: 2D Echo Indications:    stroke  History:        Patient has prior history of Echocardiogram examinations, most                 recent 09/03/2018. Risk Factors:Current Smoker, Diabetes and                 Hypertension.  Sonographer:    Johny Chess RDCS Referring Phys: [phone] ERIC LINDZEN  Sonographer Comments: Image acquisition challenging due to respiratory motion. IMPRESSIONS  1. Left ventricular ejection fraction, by estimation, is 60 to 65%. The left ventricle has normal function. The left ventricle has no regional wall motion abnormalities. Left ventricular diastolic parameters were normal.  2. Right ventricular systolic function is normal. The right ventricular size is normal.  3. The mitral valve is normal in structure. No evidence of mitral valve regurgitation. No evidence of mitral stenosis.  4. The aortic valve is tricuspid. Aortic valve regurgitation is not visualized. No aortic stenosis is present.  5. The inferior vena cava is normal in size with greater than 50% respiratory variability, suggesting right atrial pressure of 3 mmHg. FINDINGS  Left Ventricle: Left ventricular ejection fraction, by estimation, is 60 to 65%. The left ventricle has normal function. The left ventricle has no regional wall motion abnormalities. The left ventricular internal cavity size was normal in size. There is  no left ventricular hypertrophy. Left ventricular diastolic parameters were normal.  Right Ventricle: The right ventricular size is normal. No increase in right ventricular wall thickness. Right ventricular systolic function is normal. Left Atrium: Left atrial size was normal in size. Right Atrium: Right atrial size was normal in size. Pericardium: There is no evidence of pericardial effusion. Mitral Valve: The mitral valve is normal in structure. No evidence of mitral valve regurgitation. No evidence of mitral valve stenosis. Tricuspid Valve: The tricuspid valve is normal in structure. Tricuspid valve regurgitation is not demonstrated. No evidence of tricuspid stenosis. Aortic Valve: The aortic valve is tricuspid. Aortic valve regurgitation is not visualized. No aortic stenosis is present. Pulmonic Valve: The pulmonic valve was normal in structure. Pulmonic valve regurgitation is not visualized. No evidence of pulmonic stenosis. Aorta: The aortic root is normal  in size and structure. Venous: The inferior vena cava is normal in size with greater than 50% respiratory variability, suggesting right atrial pressure of 3 mmHg. IAS/Shunts: No atrial level shunt detected by color flow Doppler.  LEFT VENTRICLE PLAX 2D LVIDd:         4.90 cm   Diastology LVIDs:         3.50 cm   LV e' medial:    8.70 cm/s LV PW:         0.90 cm   LV E/e' medial:  9.7 LV IVS:        0.70 cm   LV e' lateral:   9.01 cm/s LVOT diam:     2.40 cm   LV E/e' lateral: 9.4 LV SV:         83 LV SV Index:   42 LVOT Area:     4.52 cm  RIGHT VENTRICLE             IVC RV S prime:     14.00 cm/s  IVC diam: 1.90 cm TAPSE (M-mode): 2.4 cm LEFT ATRIUM             Index        RIGHT ATRIUM           Index LA diam:        3.10 cm 1.56 cm/m   RA Area:     10.70 cm LA Vol (A2C):   34.5 ml 17.41 ml/m  RA Volume:   22.40 ml  11.30 ml/m LA Vol (A4C):   35.4 ml 17.86 ml/m LA Biplane Vol: 34.9 ml 17.61 ml/m  AORTIC VALVE LVOT Vmax:   84.10 cm/s LVOT Vmean:  52.100 cm/s LVOT VTI:    0.183 m  AORTA Ao Root diam: 3.40 cm Ao Asc diam:  3.30 cm  MITRAL VALVE MV Area (PHT): 5.27 cm    SHUNTS MV Decel Time: 144 msec    Systemic VTI:  0.18 m MV E velocity: 84.50 cm/s  Systemic Diam: 2.40 cm MV A velocity: 66.80 cm/s MV E/A ratio:  1.26 Jenkins Rouge MD Electronically signed by Jenkins Rouge MD Signature Date/Time: 03/07/2022/3:19:50 PM    Final     PHYSICAL EXAM General: Awake alert, elderly male in NAD  Neuro: Patient is confused, He is oriented to person and place. Speech with mild dysarthria. Only able to recall 1-2 animals that have 4 legs. He is able to follow simple commands.  EOMI, Visual fields full to finger count. No weakness, 5/5 in all 4 extremities. Alternating movements in tact. Wiggles toes bilateral.  ASSESSMENT/PLAN Mr. Favor Hackler is a 67 y.o. male with history of current smoker with a PMHx of hepatitis and cirrhosis, COPD, DM, HTN and prior strokes who presents to the ED with acute onset of aphasia. LKN was 7:45 PM.   Stroke like episode: Treated with thrombolysis but MRI negative for stroke ? seizure Code Stroke CT head No acute abnormality. ASPECTS 10.    CTA head & neck no LVO, left P3 severe stenosis. Repeat CT head 6/16 due to sudden vision changes - no acute abnormality MRI   1. No acute intracranial abnormality. 2. Age-related cerebral atrophy with moderate to advanced chronic microvascular ischemic disease, with multiple superimposed remote lacunar infarcts involving the hemispheric cerebral white matter, left greater than right basal ganglia, and thalami. 2D Echo EF 60-65%. No shunt [place] lowers -No evidence of deep vein thrombosis seen in the lower extremities, bilaterally. No evidence of  popliteal cyst, bilaterally.  Recommend loop recorder at this time if stroke work-up unrevealing.  EEG intermittent slowing left temporal region, no seizures identified    LDL 80 HgbA1c 5.6 UDS negative VTE prophylaxis - SCD's Aspirin 81 prior to admission, now on aspirin 81 mg daily and clopidogrel 75 mg daily. For 3  weeks than Plavix alone Therapy recommendations: Outpatient Disposition:  pending   History of stroke 08/2018 admitted for altered mental status, speech difficulty after fall causing right hip fracture.  CT showed chronic lacunar infarct within bilateral thalamus and right BG.  MRI showed multifocal infarct at bilateral frontal and parietal area.  CT head and neck unremarkable.  Carotid Doppler unremarkable.  EF 50 to 55%.  DVT negative.  LDL 128, A1c 5.3, UDS negative.  Right hip status post surgery.  Etiology for patient stroke not quite clear concerning for fat emboli versus cardioembolic source.  Recommend 30-day CardioNet monitoring as outpatient which was not done yet.  Discharged on aspirin and Lipitor 20.  Hypertension Home meds:  none  Stable Permissive hypertension (OK if < 180/105 post TNK) and then gradually normalize in 5-7 days Long-term BP goal normotensive  Hyperlipidemia Home meds:  atorvastatin '20mg'$   LDL 80, goal < 70 Increase Lipitor to 40 Continue statin at discharge  Diabetes type II Controlled Home meds:  metformin 500 mg XR BID  HgbA1c 5.6, goal < 7.0 CBGs SSI  Tobacco abuse Current smoker Smoking cessation counseling provided Pt is willing to quit  Other Stroke Risk Factors Advanced Age >/= 51 Hx stroke/TIA   Other Active Problems COPD  Albuterol inhaler PRN  Cirrhosis   Continue home lactulose, AST/ALT normal. Ammonia normal  Hospital day # 1  Beulah Gandy DNP, ACNPC-AG   I have personally obtained history,examined this patient, reviewed notes, independently viewed imaging studies, participated in medical decision making and plan of care.ROS completed by me personally and pertinent positives fully documented  I have made any additions or clarifications directly to the above note. Agree with note above.  Patient with prior history of strokes presented with sudden normal onset of aphasia and generalized weakness was treated with IV thrombolysis but  MRI is negative for acute stroke.  EEG is shows focal left temporal slowing but no definite epileptiform activity noted clinically.  Continue ongoing therapies.  Mobilize out of bed.  Recommend aspirin and Plavix for 3 weeks followed by Plavix alone.  30-day Heart monitor for paroxysmal A-fib.  Long discussion with patient and wife and answered questions.  Greater than 50% time during this 35-minute visit was spent in counseling and coordination of care about strokelike episode discussion about evaluation and treatment and answering questions  Antony Contras, MD Medical Director [place] Pager: [phone] 03/08/2022 2:00 PM  To contact Stroke Continuity provider, please refer to [email or web address] After hours, contact General Neurology

## 2022-03-08 NOTE — Progress Notes (Signed)
VASCULAR LAB    Bilateral lower extremity venous duplex has been performed.  See CV proc for preliminary results.   Raun Routh, RVT 03/08/2022, 11:09 AM

## 2022-03-08 NOTE — Evaluation (Signed)
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   Essential hypertension 07/29/2020   Hypokalemia 07/29/2020   Encephalopathy, hepatic ([place]) 07/29/2020   DM II (diabetes mellitus, type II), controlled ([place]) 07/29/2020   Visual hallucinations 07/29/2020   History of cirrhosis    Acute encephalopathy 09/02/2018   Multiple lacunar infarcts ([place]) 09/02/2018   Fracture of femoral neck, right, closed ([place]) 09/02/2018   TOBACCO DEPENDENCE 11/19/2006   BACK PAIN, LOW 11/19/2006   Past Medical History:  Past Medical History:  Diagnosis Date   Cirrhosis ([place])    COPD (chronic obstructive pulmonary disease) ([place])    Diabetes mellitus without complication ([place])    Hepatitis    Hypertension    Stroke [facility])    Past Surgical History:  Past Surgical History:  Procedure Laterality Date   BACK SURGERY     HIP PINNING,CANNULATED Right 09/04/2018   Procedure: CANNULATED HIP PINNING;  Surgeon: Marchia Bond, MD;  Location: [place];  Service: Orthopedics;  Laterality: Right;   KNEE SURGERY     SHOULDER SURGERY     HPI:  Patient is a 66 y.o. male with PMH: hepatitis and cirrhosis, COPD, DM, HTN and prior strokes x3 who presented to the ED with acute onset of aphasia. CT head and MRI brain both negative for acute intracranial abnormality. During admission in 2019, SLP evaluated patient and reported moderate expressive aphasia as well as deficits in attention.   Assessment / Plan / Recommendation Clinical Impression  Patient presents with a mild-moderate expressive aphasia but with receptive language skills largely intact. Confrontational naming was WNL but convergent and divergent naming both impaired and patient was perseverative. He was only able  to recall 1 of 5 words after 2 minute delay. He was oriented to day of the week, month and date, stated year as "[birth year]" and his age as "66" (he is 75). When asked, patient reported that on Wednesday "my speech got slurry" and when asked how it was today, he reported, "its not quite there". SLP did not observe significant dysarthria. His expressive language was adequate for basic level question response but during open-ended question responses and conversational level, he exhibited significant word-finding errors and difficulty formulating complete phrase/sentence. SLP is recommending OP SLP eval and treatment as indicated but no further acute-level intervention needed at this time.    SLP Assessment  SLP Recommendation/Assessment: All further Speech Lanaguage Pathology  needs can be addressed in the next venue of care SLP Visit Diagnosis: Aphasia (R47.01)    Recommendations for follow up therapy are one component of a multi-disciplinary discharge planning process, led by the attending physician.  Recommendations may be updated based on patient status, additional functional criteria and insurance authorization.    Follow Up Recommendations  Outpatient SLP    Assistance Recommended at Discharge  Intermittent Supervision/Assistance  Functional Status Assessment Patient has had a recent decline in their functional status and demonstrates the ability to make significant improvements in function in a reasonable and predictable amount of time.  Frequency and Duration           SLP Evaluation Cognition  Overall Cognitive Status: Difficult to assess Arousal/Alertness: Awake/alert Orientation Level: Oriented to person;Oriented to place;Disoriented to time Year: Other (Comment) [phone] Month: June Day of Week: Correct Attention: Sustained Sustained  Attention: Appears intact Memory: Impaired Memory Impairment: Other (comment);Storage deficit;Retrieval deficit (recalled 1 of 5 words after 2 minute  delay) Awareness: Appears intact Awareness Impairment: Emergent impairment Problem Solving: Impaired Problem Solving Impairment: Verbal complex Behaviors: Perseveration Safety/Judgment: Appears intact       Comprehension  Auditory Comprehension Overall Auditory Comprehension: Appears within functional limits for tasks assessed Yes/No Questions: Within Functional Limits Commands: Within Functional Limits Interfering Components: Processing speed EffectiveTechniques: Extra processing time Visual Recognition/Discrimination Discrimination: Not tested Reading Comprehension Reading Status: Not tested    Expression Expression Primary Mode of Expression: Verbal Verbal Expression Overall Verbal Expression: Impaired Initiation: No impairment Level of Generative/Spontaneous Verbalization: Sentence Repetition: No impairment Naming: Impairment Responsive: 51-75% accurate Confrontation: Within functional limits Convergent: 0-24% accurate Divergent: 50-74% accurate Verbal Errors: Perseveration;Not aware of errors Pragmatics: No impairment Interfering Components: Premorbid deficit Effective Techniques: Open ended questions Non-Verbal Means of Communication: Not applicable Written Expression Dominant Hand: Right   Oral / Motor  Oral Motor/Sensory Function Overall Oral Motor/Sensory Function: Within functional limits Motor Speech Overall Motor Speech: Appears within functional limits for tasks assessed Respiration: Within functional limits Resonance: Within functional limits Articulation: Within functional limitis Intelligibility: Intelligible Motor Planning: Witnin functional limits            Sonia Baller, MA, CCC-SLP Speech Therapy

## 2022-03-08 NOTE — Progress Notes (Signed)
EEG complete - results pending 

## 2022-03-08 NOTE — Procedures (Signed)
Patient Name: Matthew Roman  MRN: [id number]  Epilepsy Attending: Lora Havens  Referring Physician/Provider: August Albino, NP  Date: 03/08/2022 Duration: 26.07 mins  Patient history: 66yo M underwent eeg to evaluate for seizure  Level of alertness: Awake  AEDs during EEG study: None  Technical aspects: This EEG study was done with scalp electrodes positioned according to the 10-20 International system of electrode placement. Electrical activity was acquired at a sampling rate of '500Hz'$  and reviewed with a high frequency filter of '70Hz'$  and a low frequency filter of '1Hz'$ . EEG data were recorded continuously and digitally stored.   Description: The posterior dominant rhythm consists of 8-9 Hz activity of moderate voltage (25-35 uV) seen predominantly in posterior head regions, symmetric and reactive to eye opening and eye closing. EEG showed intermittent 3 to 6 Hz theta-delta slowing in left temporal region. Physiologic photic driving was seen during photic stimulation.  Hyperventilation was not performed.     ABNORMALITY - Intermittent slow, left temporal region  IMPRESSION: This study is suggestive of cortical dysfunction arising from left temporal region, non specific etiology but could be secondary to underlying stroke. No seizures or epileptiform discharges were seen throughout the recording.  Aleph Nickson Barbra Sarks

## 2022-03-09 DIAGNOSIS — I639 Cerebral infarction, unspecified: Secondary | ICD-10-CM | POA: Diagnosis not present

## 2022-03-09 DIAGNOSIS — E785 Hyperlipidemia, unspecified: Secondary | ICD-10-CM

## 2022-03-09 DIAGNOSIS — Z8719 Personal history of other diseases of the digestive system: Secondary | ICD-10-CM

## 2022-03-09 DIAGNOSIS — J449 Chronic obstructive pulmonary disease, unspecified: Secondary | ICD-10-CM

## 2022-03-09 DIAGNOSIS — I10 Essential (primary) hypertension: Secondary | ICD-10-CM

## 2022-03-09 DIAGNOSIS — E7800 Pure hypercholesterolemia, unspecified: Secondary | ICD-10-CM | POA: Diagnosis not present

## 2022-03-09 DIAGNOSIS — F17200 Nicotine dependence, unspecified, uncomplicated: Secondary | ICD-10-CM

## 2022-03-09 HISTORY — DX: Hyperlipidemia, unspecified: E78.5

## 2022-03-09 HISTORY — DX: Chronic obstructive pulmonary disease, unspecified: J44.9

## 2022-03-09 LAB — GLUCOSE, CAPILLARY
Glucose-Capillary: 121 mg/dL — ABNORMAL HIGH (ref 70–99)
Glucose-Capillary: 131 mg/dL — ABNORMAL HIGH (ref 70–99)

## 2022-03-09 MED ORDER — ASPIRIN 81 MG PO TBEC: 81.0000 mg | DELAYED_RELEASE_TABLET | Freq: Every day | ORAL | 12 refills | Status: DC

## 2022-03-09 MED ORDER — ASPIRIN 81 MG PO TBEC: 81.0000 mg | DELAYED_RELEASE_TABLET | Freq: Every day | ORAL | 0 refills | Status: AC

## 2022-03-09 MED ORDER — ALBUTEROL SULFATE (2.5 MG/3ML) 0.083% IN NEBU
2.5000 mg | INHALATION_SOLUTION | Freq: Four times a day (QID) | RESPIRATORY_TRACT | 12 refills | Status: AC | PRN
Start: 2022-03-09 — End: ?

## 2022-03-09 MED ORDER — CLOPIDOGREL BISULFATE 75 MG PO TABS: 75.0000 mg | ORAL_TABLET | Freq: Every day | ORAL | 0 refills | Status: DC

## 2022-03-09 MED ORDER — VITAMIN D3 25 MCG PO TABS: 2000.0000 [IU] | ORAL_TABLET | Freq: Every day | ORAL | 0 refills | Status: AC

## 2022-03-09 MED ORDER — ATORVASTATIN CALCIUM 40 MG PO TABS: 40.0000 mg | ORAL_TABLET | Freq: Every day | ORAL | 0 refills | Status: DC

## 2022-03-09 MED ORDER — AMLODIPINE BESYLATE 5 MG PO TABS: 5.0000 mg | ORAL_TABLET | Freq: Every day | ORAL | 2 refills | Status: AC

## 2022-03-09 NOTE — Discharge Summary (Addendum)
Stroke Discharge Summary  Patient ID: Matthew Roman   MRN: [id number]      DOB: 1955-10-16  Date of Admission: 03/06/2022 Date of Discharge: 03/09/2022  Attending Physician:  Stroke, Md, MD, Stroke MD Consultant(s):     none Patient's PCP:  Bonnita Nasuti, MD  DISCHARGE DIAGNOSIS:  Principal Problem:   DWI negative stroke Active Problems:   TOBACCO DEPENDENCE   History of cirrhosis   Essential hypertension   DM II (diabetes mellitus, type II), controlled ([place])   Hyperlipidemia   Chronic obstructive pulmonary disease (COPD) ([place])   Allergies as of 03/09/2022   No Known Allergies      Medication List     STOP taking these medications    aspirin 81 MG chewable tablet Replaced by: aspirin EC 81 MG tablet   oxyCODONE 15 MG immediate release tablet Commonly known as: ROXICODONE   ProAir HFA 108 (90 Base) MCG/ACT inhaler Generic drug: albuterol Replaced by: albuterol (2.5 MG/3ML) 0.083% nebulizer solution   Vitamin D 50 MCG (2000 UT) Caps Replaced by: Vitamin D3 25 MCG tablet       TAKE these medications    albuterol (2.5 MG/3ML) 0.083% nebulizer solution Commonly known as: PROVENTIL Take 3 mLs (2.5 mg total) by nebulization every 6 (six) hours as needed for wheezing or shortness of breath. Replaces: ProAir HFA 108 (90 Base) MCG/ACT inhaler   amLODipine 5 MG tablet Commonly known as: NORVASC Take 1 tablet (5 mg total) by mouth daily.   aspirin EC 81 MG tablet Take 1 tablet (81 mg total) by mouth daily for 17 days. Swallow whole. Start taking on: March 10, 2022 Replaces: aspirin 81 MG chewable tablet   atorvastatin 40 MG tablet Commonly known as: LIPITOR Take 1 tablet (40 mg total) by mouth daily at 6 PM. What changed:  medication strength how much to take Another medication with the same name was removed. Continue taking this medication, and follow the directions you see here.   clopidogrel 75 MG tablet Commonly known as: PLAVIX Take 1 tablet (75  mg total) by mouth daily. Start taking on: March 10, 2022   lactulose 10 GM/15ML solution Commonly known as: CHRONULAC Take 7.5 mLs (5 g total) by mouth daily.   metFORMIN 500 MG 24 hr tablet Commonly known as: GLUCOPHAGE-XR Take 500 mg by mouth 2 (two) times daily with a meal.   vitamin B-12 500 MCG tablet Commonly known as: CYANOCOBALAMIN Take 2 tablets by mouth daily.   Vitamin D3 25 MCG tablet Commonly known as: Vitamin D Take 2 tablets (2,000 Units total) by mouth daily. Start taking on: March 10, 2022 Replaces: Vitamin D 50 MCG (2000 UT) Caps        LABORATORY STUDIES CBC    Component Value Date/Time   WBC 6.0 03/08/2022 0919   RBC 4.97 03/08/2022 0919   HGB 14.8 03/08/2022 0919   HCT 43.9 03/08/2022 0919   PLT 83 (L) 03/08/2022 0919   MCV 88.3 03/08/2022 0919   MCH 29.8 03/08/2022 0919   MCHC 33.7 03/08/2022 0919   RDW 13.9 03/08/2022 0919   LYMPHSABS 1.3 03/06/2022 2108   MONOABS 0.4 03/06/2022 2108   EOSABS 0.2 03/06/2022 2108   BASOSABS 0.1 03/06/2022 2108   CMP    Component Value Date/Time   NA 143 03/06/2022 2115   K 4.2 03/06/2022 2115   CL 105 03/06/2022 2115   CO2 26 03/06/2022 2108   GLUCOSE 87 03/06/2022 2115   BUN  12 03/06/2022 2115   CREATININE 1.00 03/06/2022 2115   CALCIUM 9.3 03/06/2022 2108   PROT 7.0 03/06/2022 2108   ALBUMIN 4.3 03/06/2022 2108   AST 23 03/06/2022 2108   ALT 34 03/06/2022 2108   ALKPHOS 88 03/06/2022 2108   BILITOT 1.0 03/06/2022 2108   GFRNONAA >60 03/06/2022 2108   GFRAA >60 09/06/2018 0302   COAGS Lab Results  Component Value Date   INR 1.1 03/06/2022   INR 1.19 09/03/2018   Lipid Panel    Component Value Date/Time   CHOL 134 03/07/2022 0419   TRIG 77 03/07/2022 0419   HDL 39 (L) 03/07/2022 0419   CHOLHDL 3.4 03/07/2022 0419   VLDL 15 03/07/2022 0419   LDLCALC 80 03/07/2022 0419   HgbA1C  Lab Results  Component Value Date   HGBA1C 5.6 03/07/2022   Urinalysis    Component Value Date/Time    COLORURINE YELLOW 03/08/2022 0913   APPEARANCEUR CLEAR 03/08/2022 0913   LABSPEC 1.010 03/08/2022 0913   PHURINE 6.0 03/08/2022 0913   GLUCOSEU NEGATIVE 03/08/2022 0913   HGBUR SMALL (A) 03/08/2022 0913   BILIRUBINUR NEGATIVE 03/08/2022 0913   KETONESUR NEGATIVE 03/08/2022 0913   PROTEINUR NEGATIVE 03/08/2022 0913   NITRITE NEGATIVE 03/08/2022 0913   LEUKOCYTESUR NEGATIVE 03/08/2022 0913   Urine Drug Screen     Component Value Date/Time   LABOPIA NONE DETECTED 03/07/2022 0839   COCAINSCRNUR NONE DETECTED 03/07/2022 0839   LABBENZ NONE DETECTED 03/07/2022 0839   AMPHETMU NONE DETECTED 03/07/2022 0839   THCU NONE DETECTED 03/07/2022 0839   LABBARB NONE DETECTED 03/07/2022 0839    Alcohol Level    Component Value Date/Time   ETH <10 03/06/2022 2108     SIGNIFICANT DIAGNOSTIC STUDIES VAS [place] LOWER EXTREMITY VENOUS (DVT)  Result Date: 03/09/2022  Lower Venous DVT Study Patient Name:  Matthew Roman  Date of Exam:   03/08/2022 Medical Rec #: [id number]       Accession #:    [id number] Date of Birth: 07-22-1956       Patient Gender: M Patient Age:   66 years Exam Location:  [facility] Procedure:      VAS [place] LOWER EXTREMITY VENOUS (DVT) Referring Phys: Cornelius Moras Audra Bellard --------------------------------------------------------------------------------  Indications: Stroke.  Comparison Study: Prior negative LEV done 09/05/18 Performing Technologist: Sharion Dove RVS  Examination Guidelines: A complete evaluation includes B-mode imaging, spectral Doppler, color Doppler, and power Doppler as needed of all accessible portions of each vessel. Bilateral testing is considered an integral part of a complete examination. Limited examinations for reoccurring indications may be performed as noted. The reflux portion of the exam is performed with the patient in reverse Trendelenburg.  +---------+---------------+---------+-----------+----------+--------------+ RIGHT     CompressibilityPhasicitySpontaneityPropertiesThrombus Aging +---------+---------------+---------+-----------+----------+--------------+ CFV      Full           Yes      Yes                                 +---------+---------------+---------+-----------+----------+--------------+ SFJ      Full                                                        +---------+---------------+---------+-----------+----------+--------------+ FV Prox  Full                                                        +---------+---------------+---------+-----------+----------+--------------+  FV Mid   Full                                                        +---------+---------------+---------+-----------+----------+--------------+ FV DistalFull                                                        +---------+---------------+---------+-----------+----------+--------------+ PFV      Full                                                        +---------+---------------+---------+-----------+----------+--------------+ POP      Full           Yes      Yes                                 +---------+---------------+---------+-----------+----------+--------------+ PTV      Full                                                        +---------+---------------+---------+-----------+----------+--------------+ PERO     Full                                                        +---------+---------------+---------+-----------+----------+--------------+   +---------+---------------+---------+-----------+----------+--------------+ LEFT     CompressibilityPhasicitySpontaneityPropertiesThrombus Aging +---------+---------------+---------+-----------+----------+--------------+ CFV      Full           Yes      Yes                                 +---------+---------------+---------+-----------+----------+--------------+ SFJ      Full                                                         +---------+---------------+---------+-----------+----------+--------------+ FV Prox  Full                                                        +---------+---------------+---------+-----------+----------+--------------+ FV Mid   Full                                                        +---------+---------------+---------+-----------+----------+--------------+  FV DistalFull                                                        +---------+---------------+---------+-----------+----------+--------------+ PFV      Full                                                        +---------+---------------+---------+-----------+----------+--------------+ POP      Full           Yes      Yes                                 +---------+---------------+---------+-----------+----------+--------------+ PTV      Full                                                        +---------+---------------+---------+-----------+----------+--------------+ PERO     Full                                                        +---------+---------------+---------+-----------+----------+--------------+     Summary: BILATERAL: - No evidence of deep vein thrombosis seen in the lower extremities, bilaterally. -No evidence of popliteal cyst, bilaterally.   *See table(s) above for measurements and observations. Electronically signed by Jamelle Haring on 03/09/2022 at 11:42:59 AM.    Final    EEG adult  Result Date: 03/08/2022 Lora Havens, MD     03/08/2022 10:41 AM Patient Name: Jeffrey Graefe MRN: [id number] Epilepsy Attending: Lora Havens Referring Physician/Provider: August Albino, NP Date: 03/08/2022 Duration: 26.07 mins Patient history: 66yo M underwent eeg to evaluate for seizure Level of alertness: Awake AEDs during EEG study: None Technical aspects: This EEG study was done with scalp electrodes positioned according to the 10-20 International system of electrode  placement. Electrical activity was acquired at a sampling rate of '500Hz'$  and reviewed with a high frequency filter of '70Hz'$  and a low frequency filter of '1Hz'$ . EEG data were recorded continuously and digitally stored. Description: The posterior dominant rhythm consists of 8-9 Hz activity of moderate voltage (25-35 uV) seen predominantly in posterior head regions, symmetric and reactive to eye opening and eye closing. EEG showed intermittent 3 to 6 Hz theta-delta slowing in left temporal region. Physiologic photic driving was seen during photic stimulation.  Hyperventilation was not performed.   ABNORMALITY - Intermittent slow, left temporal region IMPRESSION: This study is suggestive of cortical dysfunction arising from left temporal region, non specific etiology but could be secondary to underlying stroke. No seizures or epileptiform discharges were seen throughout the recording. Lora Havens   DG CHEST PORT 1 VIEW  Result Date: 03/08/2022 CLINICAL DATA:  Altered mental status EXAM: PORTABLE CHEST 1 VIEW COMPARISON:  07/27/2020 and prior studies FINDINGS: There is no  evidence of focal airspace disease, pulmonary edema, suspicious pulmonary nodule/mass, pleural effusion, or pneumothorax. No acute bony abnormalities are identified. IMPRESSION: No active disease. Electronically Signed   By: Margarette Canada M.D.   On: 03/08/2022 10:05   MR BRAIN WO CONTRAST  Result Date: 03/07/2022 CLINICAL DATA:  Follow-up examination for stroke. EXAM: MRI HEAD WITHOUT CONTRAST TECHNIQUE: Multiplanar, multiecho pulse sequences of the brain and surrounding structures were obtained without intravenous contrast. COMPARISON:  Or prior CT from earlier the same day as well as earlier studies. FINDINGS: Brain: Age-related cerebral atrophy. Patchy and confluent T2/FLAIR hyperintensity involving the periventricular and deep white matter both cerebral hemispheres most consistent with chronic small vessel ischemic disease, moderate to  advanced in nature. Patchy involvement of the pons noted. Multiple scattered superimposed remote lacunar infarcts present about the hemispheric cerebral white matter, left greater than right basal ganglia, and thalami. No evidence for acute or subacute infarct. Note made of a punctate focus of diffusion signal abnormality involving the posterior right frontal periventricular white matter on axial DWI sequence image 34, felt to be related to adjacent focus of susceptibility artifact. Gray-white matter differentiation maintained. No other areas of chronic cortical infarction. No acute intracranial hemorrhage. Single chronic microhemorrhage at the posterior right frontal region, likely small vessel/hypertensive in nature. No mass lesion, midline shift or mass effect. No hydrocephalus or extra-axial fluid collection. Pituitary gland suprasellar region within normal limits. Vascular: Major intracranial vascular flow voids are maintained. Skull and upper cervical spine: Craniocervical junction within normal limits. Bone marrow signal intensity normal. No scalp soft tissue abnormality. Sinuses/Orbits: Globes and orbital soft tissues within normal limits. Mild scattered mucosal thickening noted throughout the ethmoidal air cells and maxillary sinuses. No mastoid effusion. Other: Probable 1.6 cm sebaceous cyst noted within the subcutaneous fat of the left face. IMPRESSION: 1. No acute intracranial abnormality. 2. Age-related cerebral atrophy with moderate to advanced chronic microvascular ischemic disease, with multiple superimposed remote lacunar infarcts involving the hemispheric cerebral white matter, left greater than right basal ganglia, and thalami. Electronically Signed   By: Jeannine Boga M.D.   On: 03/07/2022 23:26   CT HEAD WO CONTRAST (5MM)  Result Date: 03/07/2022 CLINICAL DATA:  Stroke, follow-up, patient complaining of visual changes EXAM: CT HEAD WITHOUT CONTRAST TECHNIQUE: Contiguous axial images  were obtained from the base of the skull through the vertex without intravenous contrast. RADIATION DOSE REDUCTION: This exam was performed according to the departmental dose-optimization program which includes automated exposure control, adjustment of the mA and/or kV according to patient size and/or use of iterative reconstruction technique. COMPARISON:  03/06/2022 FINDINGS: Brain: No evidence of acute infarction, hemorrhage, cerebral edema, mass, mass effect, or midline shift. No hydrocephalus or extra-axial fluid collection. Periventricular white matter changes, likely the sequela of chronic small vessel ischemic disease. Redemonstrated lacunar infarcts in the left-greater-than-right basal ganglia and thalami. Ex vacuo dilatation of the left lateral ventricle, unchanged. Vascular: No hyperdense vessel. Atherosclerotic calcifications in the intracranial carotid and vertebral arteries. Skull: Normal. Negative for fracture or focal lesion. Sinuses/Orbits: Mild mucosal thickening in the ethmoid air cells. Otherwise clear. The orbits are unremarkable. Other: The mastoid air cells are well aerated. IMPRESSION: No acute intracranial process. Electronically Signed   By: Merilyn Baba M.D.   On: 03/07/2022 18:59   ECHOCARDIOGRAM COMPLETE  Result Date: 03/07/2022    ECHOCARDIOGRAM REPORT   Patient Name:   TREMONT GAVITT Date of Exam: 03/07/2022 Medical Rec #:  [id number]      Height:  72.0 in Accession #:    [id number]     Weight:       168.7 lb Date of Birth:  12-15-1955      BSA:          1.982 m Patient Age:    52 years       BP:           135/58 mmHg Patient Gender: M              HR:           58 bpm. Exam Location:  Inpatient Procedure: 2D Echo Indications:    stroke  History:        Patient has prior history of Echocardiogram examinations, most                 recent 09/03/2018. Risk Factors:Current Smoker, Diabetes and                 Hypertension.  Sonographer:    Johny Chess RDCS Referring Phys:  [phone] ERIC LINDZEN  Sonographer Comments: Image acquisition challenging due to respiratory motion. IMPRESSIONS  1. Left ventricular ejection fraction, by estimation, is 60 to 65%. The left ventricle has normal function. The left ventricle has no regional wall motion abnormalities. Left ventricular diastolic parameters were normal.  2. Right ventricular systolic function is normal. The right ventricular size is normal.  3. The mitral valve is normal in structure. No evidence of mitral valve regurgitation. No evidence of mitral stenosis.  4. The aortic valve is tricuspid. Aortic valve regurgitation is not visualized. No aortic stenosis is present.  5. The inferior vena cava is normal in size with greater than 50% respiratory variability, suggesting right atrial pressure of 3 mmHg. FINDINGS  Left Ventricle: Left ventricular ejection fraction, by estimation, is 60 to 65%. The left ventricle has normal function. The left ventricle has no regional wall motion abnormalities. The left ventricular internal cavity size was normal in size. There is  no left ventricular hypertrophy. Left ventricular diastolic parameters were normal. Right Ventricle: The right ventricular size is normal. No increase in right ventricular wall thickness. Right ventricular systolic function is normal. Left Atrium: Left atrial size was normal in size. Right Atrium: Right atrial size was normal in size. Pericardium: There is no evidence of pericardial effusion. Mitral Valve: The mitral valve is normal in structure. No evidence of mitral valve regurgitation. No evidence of mitral valve stenosis. Tricuspid Valve: The tricuspid valve is normal in structure. Tricuspid valve regurgitation is not demonstrated. No evidence of tricuspid stenosis. Aortic Valve: The aortic valve is tricuspid. Aortic valve regurgitation is not visualized. No aortic stenosis is present. Pulmonic Valve: The pulmonic valve was normal in structure. Pulmonic valve regurgitation is  not visualized. No evidence of pulmonic stenosis. Aorta: The aortic root is normal in size and structure. Venous: The inferior vena cava is normal in size with greater than 50% respiratory variability, suggesting right atrial pressure of 3 mmHg. IAS/Shunts: No atrial level shunt detected by color flow Doppler.  LEFT VENTRICLE PLAX 2D LVIDd:         4.90 cm   Diastology LVIDs:         3.50 cm   LV e' medial:    8.70 cm/s LV PW:         0.90 cm   LV E/e' medial:  9.7 LV IVS:        0.70 cm   LV e' lateral:   9.01 cm/s  LVOT diam:     2.40 cm   LV E/e' lateral: 9.4 LV SV:         83 LV SV Index:   42 LVOT Area:     4.52 cm  RIGHT VENTRICLE             IVC RV S prime:     14.00 cm/s  IVC diam: 1.90 cm TAPSE (M-mode): 2.4 cm LEFT ATRIUM             Index        RIGHT ATRIUM           Index LA diam:        3.10 cm 1.56 cm/m   RA Area:     10.70 cm LA Vol (A2C):   34.5 ml 17.41 ml/m  RA Volume:   22.40 ml  11.30 ml/m LA Vol (A4C):   35.4 ml 17.86 ml/m LA Biplane Vol: 34.9 ml 17.61 ml/m  AORTIC VALVE LVOT Vmax:   84.10 cm/s LVOT Vmean:  52.100 cm/s LVOT VTI:    0.183 m  AORTA Ao Root diam: 3.40 cm Ao Asc diam:  3.30 cm MITRAL VALVE MV Area (PHT): 5.27 cm    SHUNTS MV Decel Time: 144 msec    Systemic VTI:  0.18 m MV E velocity: 84.50 cm/s  Systemic Diam: 2.40 cm MV A velocity: 66.80 cm/s MV E/A ratio:  1.26 Jenkins Rouge MD Electronically signed by Jenkins Rouge MD Signature Date/Time: 03/07/2022/3:19:50 PM    Final    CT ANGIO HEAD NECK W WO CM (CODE STROKE)  Result Date: 03/06/2022 CLINICAL DATA:  Initial evaluation for neuro deficit, stroke suspected. EXAM: CT ANGIOGRAPHY HEAD AND NECK TECHNIQUE: Multidetector CT imaging of the head and neck was performed using the standard protocol during bolus administration of intravenous contrast. Multiplanar CT image reconstructions and MIPs were obtained to evaluate the vascular anatomy. Carotid stenosis measurements (when applicable) are obtained utilizing NASCET criteria,  using the distal internal carotid diameter as the denominator. RADIATION DOSE REDUCTION: This exam was performed according to the departmental dose-optimization program which includes automated exposure control, adjustment of the mA and/or kV according to patient size and/or use of iterative reconstruction technique. CONTRAST:  54m OMNIPAQUE IOHEXOL 350 MG/ML SOLN COMPARISON:  Prior CT from earlier the same day. FINDINGS: CTA NECK FINDINGS Aortic arch: Visualized aortic arch normal caliber with standard branching pattern. Atheromatous change about the arch and origin the great vessels without significant stenosis. Right carotid system: Right CCA patent without stenosis. Eccentric calcified plaque about the right carotid bulb without hemodynamically greater than 50% stenosis. Right ICA patent distally without stenosis or dissection. Left carotid system: Left CCA patent without stenosis. Mild eccentric plaque about the left carotid bulb/proximal left ICA without significant stenosis. Left ICA patent distally without stenosis or dissection. Vertebral arteries: Both vertebral arteries arise from subclavian arteries. Right vertebral artery dominant. Vertebral arteries patent without stenosis or dissection. Skeleton: No discrete or worrisome osseous lesions. Moderate spondylosis present at C6-7. Patient is edentulous. Other neck: No other acute soft tissue abnormality within the neck. Probable small sebaceous cyst noted at the left face. Upper chest: Emphysema. 3 mm left upper lobe nodule (series 6, image 186), indeterminate. Review of the MIP images confirms the above findings CTA HEAD FINDINGS Anterior circulation: Petrous segments patent bilaterally. Atheromatous change within the carotid siphons with no more than mild multifocal narrowing. A1 segments patent. Normal anterior communicating complex. Anterior cerebral arteries patent without significant stenosis. No  M1 stenosis or occlusion. No proximal MCA branch  occlusion. Distal MCA branches perfused and symmetric. Diffuse distal small vessel atheromatous irregularity. Posterior circulation: Dominant right V4 segment patent to the vertebrobasilar junction without significant stenosis. Hypoplastic left vertebral artery largely terminates in PICA, although a tiny branch ascending towards the vertebrobasilar junction. Both PICA patent. Basilar patent to its distal aspect without stenosis. Superior cerebellar arteries patent bilaterally. Atheromatous irregularity throughout the PCAs bilaterally. Superimposed severe distal left P3 stenosis noted (series 13, image 23). No proximal high-grade stenosis. Venous sinuses: Grossly patent allowing for timing the contrast bolus. Anatomic variants: Dominant right vertebral artery.  No aneurysm. Review of the MIP images confirms the above findings IMPRESSION: 1. Negative CTA for large vessel occlusion or other emergent finding. 2. Mild to moderate intracranial atherosclerotic disease. Associated severe distal left P3 stenosis. No proximal high-grade or correctable stenosis. 3. Atheromatous change about the carotid bifurcations without significant stenosis. No hemodynamically significant stenosis within the neck. 4.  Emphysema (ICD10-J43.9). 5. 3 mm left upper lobe nodule, indeterminate. A non-contrast Chest CT at 12 months is optional. If performed and the nodule is stable at 12 months, no further follow-up is recommended. These guidelines do not apply to immunocompromised patients and patients with cancer. Follow up in patients with significant comorbidities as clinically warranted. For lung cancer screening, adhere to Lung-RADS guidelines. Reference: Radiology. 2017; 284(1):228-43. Results discussed by telephone at the time of interpretation on 03/06/2022 at 10:13 pm to provider ERIC [facility] , who verbally acknowledged these results. Electronically Signed   By: Jeannine Boga M.D.   On: 03/06/2022 22:32   CT HEAD CODE STROKE WO  CONTRAST  Result Date: 03/06/2022 CLINICAL DATA:  Code stroke. Initial evaluation for neuro deficit, stroke suspected, aphasia, left hand weakness. EXAM: CT HEAD WITHOUT CONTRAST TECHNIQUE: Contiguous axial images were obtained from the base of the skull through the vertex without intravenous contrast. RADIATION DOSE REDUCTION: This exam was performed according to the departmental dose-optimization program which includes automated exposure control, adjustment of the mA and/or kV according to patient size and/or use of iterative reconstruction technique. COMPARISON:  Prior CT from 07/27/2020. FINDINGS: Brain: Age-related cerebral atrophy with chronic microvascular ischemic disease, progressed from prior. Few scattered remote lacunar infarcts present about the left greater than right basal ganglia and thalami. No acute intracranial hemorrhage. No acute large vessel territory infarct. No mass lesion or midline shift. Ex vacuo dilatation left lateral ventricle related to the chronic left basal ganglia infarcts without hydrocephalus. No extra-axial fluid collection. Vascular: No hyperdense vessel. Calcified atherosclerosis present at skull base. Skull: Scalp soft tissues within normal limits.  Calvarium intact. Sinuses/Orbits: Globes orbital soft tissues demonstrate no acute finding. Paranasal sinuses are largely clear. No significant mastoid effusion. Other: None. ASPECTS [facility] Stroke Program Early CT Score) - Ganglionic level infarction (caudate, lentiform nuclei, internal capsule, insula, M1-M3 cortex): 7 - Supraganglionic infarction (M4-M6 cortex): 3 Total score (0-10 with 10 being normal): 10 IMPRESSION: 1. No acute intracranial abnormality. 2. ASPECTS is 10. 3. Age-related cerebral atrophy with moderate to advanced chronic microvascular ischemic disease, with multiple scattered remote lacunar infarcts as above. These results were communicated to Dr. Cheral Marker at 9:18 pm on 03/06/2022 by text page via the [facility]  messaging system. Electronically Signed   By: Jeannine Boga M.D.   On: 03/06/2022 21:20      HISTORY OF PRESENT ILLNESS Rodman Recupero is an 66 y.o. male current smoker with a PMHx of hepatitis and cirrhosis, COPD, DM, HTN and prior  strokes x 3 who presents to the ED with acute onset of aphasia. LKN was 7:45 PM. He was noted to be diffusely weak on evaluation in Triage but with left grip weaker than right. He is on ASA at home. Not on any blood thinners.   Winters is an 66 y.o. male current smoker with a PMHx of hepatitis and cirrhosis, COPD, DM, HTN and prior strokes x 3 who presents to the ED with acute onset of aphasia on March 06, 2022. Initial NIHSS 5. CT head revealed  No acute intracranial abnormality. ASPECTS is 10. Age-related cerebral atrophy with moderate to advanced chronic microvascular ischemic disease, with multiple scattered remote lacunar infarcts. CTA of head and neck with no LVO. Mild to moderate intracranial atherosclerotic disease. Associated severe distal left P3 stenosis. No proximal high-grade or correctable stenosis. Atheromatous change about the carotid bifurcations without significant stenosis. No hemodynamically significant stenosis within the neck. He was deemed an appropriate candidate for tenectaplase IV and admitted to the Neuro ICU. MRI brain revealed . No acute intracranial abnormality and 2. Age-related cerebral atrophy with moderate to advanced chronic microvascular ischemic disease, with multiple superimposed remote lacunar infarcts involving the hemispheric cerebral white matter, left greater than right basal ganglia, and thalami. Stroke workup completed and results are as follow: cardiac echo with EF 60-65%. Bilateral lower extremity US No evidence of deep vein thrombosis seen in the lower extremities, bilaterally. No evidence of popliteal cyst, bilaterally. LDL-c 80, home atorvastatin increased to '40mg'$  for a goal of <70. HGBA1c 5.6 on home  metformin.  Since negative stroke on MRI we r/o infection: UA negative, CXR with no acute process, ammonia level 19. We checked a routine EEG resulting of "This study is suggestive of cortical dysfunction arising from left temporal region, non specific etiology but could be secondary to underlying stroke. No seizures or epileptiform discharges were seen throughout the recording" Therapy recommended outpatient PT and speech outpatient, with referrals sent.  We are recommending ASA/Plavix for 3 weeks followed by  Plavix alone. We are also recommending 30-day Heart monitor for paroxysmal A-fib.   I called and spoke to the patients granddaughter, Noah Delaine via phone and updated on the progress, plan going forward the need for 30 day telemetry monitoring and all results. Also informed her that we will be discharging home today and she has confirmed that someone will be able to stay with him  He has been deemed safe, appropriate and medically stable for discharge to home.   Stroke: Possible DWI negative left MCA infarct secondary to intracranial atherosclerosis versus cardioembolic Code Stroke CT head No acute abnormality. ASPECTS 10.    CTA head & neck no LVO, left P3 severe stenosis. MRI no acute infarct, chronic left CR, right thalamus lacunar infarcts 2D Echo EF 60 to 65% [place] lowers no DVT Recommend 30-day cardiac event monitor as outpatient to rule out A-fib LDL 80 HgbA1c 5.6 UDS negative VTE prophylaxis - SCD's Aspirin 81 prior to admission, now on ASA and Plavix DAPT for 3 weeks and then plavix alone Therapy recommendations: Outpatient Disposition:  pending    History of stroke 08/2018 admitted for altered mental status, speech difficulty after fall causing right hip fracture.  CT showed chronic lacunar infarct within bilateral thalamus and right BG.  MRI showed multifocal infarct at bilateral frontal and parietal area.  CT head and neck unremarkable.  Carotid Doppler unremarkable.  EF 50 to  55%.  DVT negative.  LDL 128, A1c 5.3, UDS  negative.  Right hip status post surgery.  Etiology for patient stroke not quite clear concerning for fat emboli versus cardioembolic source.  Recommend 30-day CardioNet monitoring as outpatient which was not done yet.  Discharged on aspirin and Lipitor 20.   Hypertension Home meds:  none  Stable Long-term BP goal normotensive   Hyperlipidemia Home meds:  atorvastatin '20mg'$   LDL 80, goal < 70 Increase Lipitor to 40 Continue statin at discharge   Diabetes type II Controlled Home meds:  metformin 500 mg XR BID  HgbA1c 5.6, goal < 7.0 CBGs SSI   Tobacco abuse Current smoker Smoking cessation counseling provided Pt is willing to quit   Other Stroke Risk Factors Advanced Age >/= 24 Hx stroke/TIA   Other Active Problems COPD  Albuterol inhaler PRN  Cirrhosis              Continue home lactulose, AST/ALT normal  DISCHARGE EXAM Blood pressure 129/74, pulse (!) 56, temperature 97.9 F (36.6 C), temperature source Oral, resp. rate (!) 22, height 6' (1.829 m), weight 76.5 kg, SpO2 94 %. General: Awake alert, elderly male in NAD Psych: normal affect and mood Patient is walking around the unit. He states his speech is better today, mild word finding issues. He is alert and oriented x 4 today. No aphasia or dysarthria. Able to repeat phrases and name objects. EOMI, Visual fields are full. Strength 5/5 in all 4 extremities. Able to follow commands. EEG with intermittent slowing left temporal region, no seizures identified. Recommend 30 day monitoring post discharge.    Discharge Diet       Diet   Diet heart healthy/carb modified Room service appropriate? Yes with Assist; Fluid consistency: Thin   liquids  DISCHARGE PLAN Disposition:  home  aspirin 81 mg daily and clopidogrel 75 mg daily for secondary stroke prevention for 3  then Plavix alone. Ongoing stroke risk factor control by Primary Care Physician at time of discharge Follow-up PCP  [place], Rosalyn Charters, MD in 2 weeks. Follow-up in [facility] in 4 weeks, office to schedule an appointment.   55 minutes were spent preparing discharge.  Beulah Gandy DNP, ACNPC-AG    ATTENDING NOTE: I reviewed above note and agree with the assessment and plan. Pt was seen and examined.   No family at bedside.  Patient was walking with RN in the hallway, no difficulties.  Patient stated his speech much improved although still has intermittent word finding difficulty.  On exam, patient awake alert, orientated x3, able to name repeat, only occasional word finding difficulty.  Follows simple commands.  No focal deficit.  Recommend continue DAPT and 30-day CardioNet monitor as outpatient.  We will follow-up at [place] in 4 weeks.  Patient will be discharged in good condition.  For detailed assessment and plan, please refer to above as I have made changes wherever appropriate.   Rosalin Hawking, MD PhD Stroke Neurology 03/09/2022 2:23 PM

## 2022-03-09 NOTE — TOC Transition Note (Signed)
Transition of Care [facility]) - CM/SW Discharge Note   Patient Details  Name: Matthew Roman MRN: [id number] Date of Birth: 09-Nov-1955  [facility] CM/SW Contact:  Bartholomew Crews, RN Phone Number: [phone] 03/09/2022, 2:44 PM   Clinical Narrative:     Patient to transition home today. OP rehab referral for PT and SLP completed by provider. Patient has community PCP. No TOC needs identified at this time.   Final next level of care: OP Rehab Barriers to Discharge: No Barriers Identified   Patient Goals and CMS Choice        Discharge Placement                       Discharge Plan and Services                                     Social Determinants of Health (SDOH) Interventions     Readmission Risk Interventions     No data to display

## 2022-03-10 ENCOUNTER — Other Ambulatory Visit: Payer: Self-pay | Admitting: Cardiology

## 2022-03-10 DIAGNOSIS — G459 Transient cerebral ischemic attack, unspecified: Secondary | ICD-10-CM

## 2022-03-11 ENCOUNTER — Ambulatory Visit: Payer: Medicare Other | Admitting: Speech Pathology

## 2022-03-11 ENCOUNTER — Other Ambulatory Visit: Payer: Self-pay | Admitting: Cardiology

## 2022-03-11 ENCOUNTER — Ambulatory Visit: Payer: Medicare Other | Attending: Neurology | Admitting: Physical Therapy

## 2022-03-11 ENCOUNTER — Encounter: Payer: Self-pay | Admitting: *Deleted

## 2022-03-11 DIAGNOSIS — R2681 Unsteadiness on feet: Secondary | ICD-10-CM | POA: Diagnosis present

## 2022-03-11 DIAGNOSIS — R4701 Aphasia: Secondary | ICD-10-CM | POA: Insufficient documentation

## 2022-03-11 DIAGNOSIS — I480 Paroxysmal atrial fibrillation: Secondary | ICD-10-CM

## 2022-03-11 DIAGNOSIS — R41841 Cognitive communication deficit: Secondary | ICD-10-CM

## 2022-03-11 DIAGNOSIS — R2689 Other abnormalities of gait and mobility: Secondary | ICD-10-CM | POA: Diagnosis present

## 2022-03-11 DIAGNOSIS — G459 Transient cerebral ischemic attack, unspecified: Secondary | ICD-10-CM

## 2022-03-11 DIAGNOSIS — I639 Cerebral infarction, unspecified: Secondary | ICD-10-CM

## 2022-03-11 NOTE — Therapy (Unsigned)
OUTPATIENT SPEECH LANGUAGE PATHOLOGY APHASIA EVALUATION   Patient Name: Matthew Roman MRN: [id number] DOB:1955/12/29, 66 y.o., male Today's Date: 03/11/2022  PCP: Bonnita Nasuti, MD  REFERRING PROVIDER: Rosalin Hawking, MD    End of Session - 03/11/22 1541     Visit Number 1    Number of Visits 13    Date for SLP Re-Evaluation 06/03/22    Progress Note Due on Visit 10    SLP Start Time 1445    SLP Stop Time  7591    SLP Time Calculation (min) 45 min    Activity Tolerance Patient tolerated treatment well             Past Medical History:  Diagnosis Date   Cirrhosis ([place])    COPD (chronic obstructive pulmonary disease) ([place])    Diabetes mellitus without complication ([place])    Hepatitis    Hypertension    Stroke [facility])    Past Surgical History:  Procedure Laterality Date   BACK SURGERY     HIP PINNING,CANNULATED Right 09/04/2018   Procedure: CANNULATED HIP PINNING;  Surgeon: Marchia Bond, MD;  Location: [facility];  Service: Orthopedics;  Laterality: Right;   KNEE SURGERY     SHOULDER SURGERY     Patient Active Problem List   Diagnosis Date Noted   Hyperlipidemia 03/09/2022   Chronic obstructive pulmonary disease (COPD) ([place]) 03/09/2022   TIA (transient ischemic attack) 03/07/2022   Essential hypertension 07/29/2020   Hypokalemia 07/29/2020   Encephalopathy, hepatic ([place]) 07/29/2020   DM II (diabetes mellitus, type II), controlled ([place]) 07/29/2020   Visual hallucinations 07/29/2020   History of cirrhosis    Acute encephalopathy 09/02/2018   Multiple lacunar infarcts ([place]) 09/02/2018   Fracture of femoral neck, right, closed ([place]) 09/02/2018   TOBACCO DEPENDENCE 11/19/2006   BACK PAIN, LOW 11/19/2006    ONSET DATE: 03-06-22   REFERRING DIAG: I63.9 (ICD-10-CM) - Acute ischemic stroke (HCC)  THERAPY DIAG:  Aphasia  Cognitive communication deficit  Rationale for Evaluation and Treatment Rehabilitation  SUBJECTIVE:   SUBJECTIVE STATEMENT: "My speech isn't  quite right" Pt accompanied by: self  PERTINENT HISTORY: 66 y.o. male current smoker with a PMHx of hepatitis and cirrhosis, COPD, DM, HTN and prior strokes x 3 who presents to the ED with acute onset of aphasia on March 06, 2022. Initial NIHSS 5. CT head revealed  No acute intracranial abnormality. ASPECTS is 10. Age-related cerebral atrophy with moderate to advanced chronic microvascular ischemic disease, with multiple scattered remote lacunar infarcts. CTA of head and neck with no LVO. Mild to moderate intracranial atherosclerotic disease.   PAIN:  Are you having pain? No  FALLS: Has patient fallen in last 6 months?  No  LIVING ENVIRONMENT: Lives with: lives with their family and lives with an adult companion Lives in: House/apartment  PLOF:  Level of assistance: Independent with IADLs Employment: Retired   PATIENT GOALS "get my words out"  OBJECTIVE:   DIAGNOSTIC FINDINGS: MRI HEAD WITHOUT CONTRAST IMPRESSION: 1. No acute intracranial abnormality. 2. Age-related cerebral atrophy with moderate to advanced chronic microvascular ischemic disease, with multiple superimposed remote lacunar infarcts involving the hemispheric cerebral white matter, left greater than right basal ganglia, and thalami.  COGNITION: Overall cognitive status: Within functional limits for tasks assessed and Difficulty to assess due to: no family present  AUDITORY COMPREHENSION: Overall auditory comprehension: Appears intact YES/NO questions: Appears intact Following directions: Appears intact Conversation: Moderately Complex Interfering components:  n/a Effective technique:  n/a   READING  COMPREHENSION: Intact  EXPRESSION: verbal  VERBAL EXPRESSION: Level of generative/spontaneous verbalization: phrase, sentence, and conversation Automatic speech: month of year: intact  Repetition: Appears intact Naming: Confrontation: 76-100%, Convergent: 26-50%, and Divergent: attempted functional task, pt  does not respond Pragmatics: Appears intact Comments: *** Interfering components:  ? Higher level cognitive deficits, difficult to assess Effective technique: open ended questions, semantic cues, phonemic cues, and articulatory cues Non-verbal means of communication: N/A  WRITTEN EXPRESSION: Dominant hand: right  Written expression: Appears intact  MOTOR SPEECH: Overall motor speech: Appears intact  STANDARDIZED ASSESSMENTS: Deferred d/t pt complaints and SLP observations   PATIENT REPORTED OUTCOME MEASURES (PROM): Communication Participation Item Bank: 10  ***   TODAY'S TREATMENT:  ***   PATIENT EDUCATION: Education details: *** Person educated: Patient Education method: Explanation, Demonstration, and Handouts Education comprehension: verbalized understanding, returned demonstration, and needs further education   GOALS: Goals reviewed with patient? Yes  SHORT TERM GOALS: Target date: {follow up:25551}  (Remove Blue Hyperlink)  *** Baseline: Goal status: {GOALSTATUS:25110}  2.  *** Baseline:  Goal status: {GOALSTATUS:25110}  3.  *** Baseline:  Goal status: {GOALSTATUS:25110}  4.  *** Baseline:  Goal status: {GOALSTATUS:25110}  5.  *** Baseline:  Goal status: {GOALSTATUS:25110}  6.  *** Baseline:  Goal status: {GOALSTATUS:25110}  LONG TERM GOALS: Target date: {follow up:25551}  (Remove Blue Hyperlink)  *** Baseline:  Goal status: {GOALSTATUS:25110}  2.  *** Baseline:  Goal status: {GOALSTATUS:25110}  3.  *** Baseline:  Goal status: {GOALSTATUS:25110}  4.  *** Baseline:  Goal status: {GOALSTATUS:25110}  5.  *** Baseline:  Goal status: {GOALSTATUS:25110}  6.  *** Baseline:  Goal status: {GOALSTATUS:25110}  ASSESSMENT:  CLINICAL IMPRESSION: Patient is a *** y.o. *** who was seen today for ***.   OBJECTIVE IMPAIRMENTS include {SLPOBJIMP:27107}. These impairments are limiting patient from {SLPLIMIT:27108}. Factors affecting  potential to achieve goals and functional outcome are {SLP factors:25450}. Patient will benefit from skilled SLP services to address above impairments and improve overall function.  REHAB POTENTIAL: {rehabpotential:25112}  PLAN: SLP FREQUENCY: {rehab frequency:25116}  SLP DURATION: {rehab duration:25117}  PLANNED INTERVENTIONS: {SLP treatment/interventions:25449}    Su Monks, CCC-SLP 03/11/2022, 3:43 PM

## 2022-03-11 NOTE — Progress Notes (Signed)
Patient ID: Matthew Roman, male   DOB: 03-14-1956, 66 y.o.   MRN: [id number] Patient enrolled for [employer] to ship a 30 day cardiac event monitor to his address on file.  Letter with instructions mailed to patient.  Dr. Bettina Gavia to read.

## 2022-03-11 NOTE — Therapy (Incomplete)
OUTPATIENT PHYSICAL THERAPY NEURO EVALUATION   Patient Name: Matthew Roman MRN: [id number] DOB:1956-07-16, 66 y.o., male Today's Date: 03/12/2022   PCP: Bonnita Nasuti, MD REFERRING PROVIDER: Rosalin Hawking, MD    PT End of Session - 03/11/22 1404     Visit Number 1    Number of Visits 9   Plus eval   Date for PT Re-Evaluation 06/03/22   Due to delay in scheduling   Authorization Type VA and Medicare    PT Start Time 1402    PT Stop Time 1444    PT Time Calculation (min) 42 min    Activity Tolerance Patient tolerated treatment well    Behavior During Therapy [facility] for tasks assessed/performed             Past Medical History:  Diagnosis Date   Cirrhosis ([place])    COPD (chronic obstructive pulmonary disease) ([place])    Diabetes mellitus without complication ([place])    Hepatitis    Hypertension    Stroke [facility])    Past Surgical History:  Procedure Laterality Date   BACK SURGERY     HIP PINNING,CANNULATED Right 09/04/2018   Procedure: CANNULATED HIP PINNING;  Surgeon: Marchia Bond, MD;  Location: [place];  Service: Orthopedics;  Laterality: Right;   KNEE SURGERY     SHOULDER SURGERY     Patient Active Problem List   Diagnosis Date Noted   Hyperlipidemia 03/09/2022   Chronic obstructive pulmonary disease (COPD) ([place]) 03/09/2022   TIA (transient ischemic attack) 03/07/2022   Essential hypertension 07/29/2020   Hypokalemia 07/29/2020   Encephalopathy, hepatic ([place]) 07/29/2020   DM II (diabetes mellitus, type II), controlled ([place]) 07/29/2020   Visual hallucinations 07/29/2020   History of cirrhosis    Acute encephalopathy 09/02/2018   Multiple lacunar infarcts ([place]) 09/02/2018   Fracture of femoral neck, right, closed ([place]) 09/02/2018   TOBACCO DEPENDENCE 11/19/2006   BACK PAIN, LOW 11/19/2006    ONSET DATE: 03/09/2022   REFERRING DIAG: I63.9 (ICD-10-CM) - Acute ischemic stroke (HCC)   THERAPY DIAG:  Unsteadiness on feet - Plan: PT plan of care  cert/re-cert  Other abnormalities of gait and mobility - Plan: PT plan of care cert/re-cert  Rationale for Evaluation and Treatment Rehabilitation  SUBJECTIVE:                                                                                                                                                                                              SUBJECTIVE STATEMENT: "[name]". 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  Pt states "I do not feel right" but is having no difficulty with ADLs at home.   Pt accompanied by: self  PERTINENT HISTORY: current smoker with a PMHx of hepatitis and cirrhosis, COPD, DM, HTN and prior strokes x 3   PAIN:  Are you having pain? No  PRECAUTIONS: Fall  WEIGHT BEARING RESTRICTIONS No  FALLS: Has patient fallen in last 6 months? No  LIVING ENVIRONMENT: Lives with:  friends  Lives in: House/apartment Stairs: Yes: External: 10 steps; can reach both Has following equipment at home: Single point cane  PLOF: Independent  PATIENT GOALS "I wanna get back to where I was"  OBJECTIVE:   DIAGNOSTIC FINDINGS: MRI on 03/07/22: IMPRESSION: 1. No acute intracranial abnormality. 2. Age-related cerebral atrophy with moderate to advanced chronic microvascular ischemic disease, with multiple superimposed remote lacunar infarcts involving the hemispheric cerebral white matter, left greater than right basal ganglia, and thalami.  COGNITION: Overall cognitive status: Difficulty to assess due to: no family present and chronic CVA   SENSATION: Not tested - pt denies numbness/tingling in BLEs   COORDINATION: Heel to shin test: WFL bilaterally    POSTURE: rounded shoulders, forward head, increased thoracic kyphosis, and posterior pelvic tilt   LOWER EXTREMITY MMT:    MMT Right Eval Left Eval  Hip flexion 5 5  Hip extension    Hip abduction 5 4+  Hip adduction 5 5  Hip internal  rotation    Hip external rotation    Knee flexion 5 5  Knee extension 5 4+  Ankle dorsiflexion 5 5  Ankle plantarflexion    Ankle inversion    Ankle eversion    (Blank rows = not tested)  BED MOBILITY:  Independent per pt   TRANSFERS: Assistive device utilized: None  Sit to stand: SBA Stand to sit: SBA   STAIRS:  Level of Assistance: SBA Stair Negotiation Technique: Alternating Pattern  with No Rails  Number of Stairs: 4   Height of Stairs: 6"  Comments: No imbalance noted   GAIT: Gait pattern:  decreased cadence, decreased arm swing- Right, decreased step length- Right, decreased stance time- Left, and decreased stride length Distance walked: Various clinic distances  Assistive device utilized: None Level of assistance: SBA Comments: Pt demonstrates guarded gait pattern and mild lateral deviations to R side   FUNCTIONAL TESTs:   [facility] PT Assessment - 03/11/22 1425       Transfers   Five time sit to stand comments  14.47s without UE support      Ambulation/Gait   Gait velocity 32.8' over 11.21s = 2.92 ft/s without AD      Balance   Balance Assessed Yes      Functional Gait  Assessment   Gait assessed  Yes    Gait Level Surface Walks 20 ft, slow speed, abnormal gait pattern, evidence for imbalance or deviates 10-15 in outside of the 12 in walkway width. Requires more than 7 sec to ambulate 20 ft.   7.28s   Change in Gait Speed Makes only minor adjustments to walking speed, or accomplishes a change in speed with significant gait deviations, deviates 10-15 in outside the 12 in walkway width, or changes speed but loses balance but is able to recover and continue walking.    Gait with Horizontal Head Turns Performs head turns smoothly with slight change in gait velocity (eg, minor disruption to smooth gait path), deviates 6-10 in outside 12 in walkway width, or uses an assistive device.  Gait with Vertical Head Turns Performs head turns with no change in gait. Deviates  no more than 6 in outside 12 in walkway width.    Gait and Pivot Turn Pivot turns safely within 3 sec and stops quickly with no loss of balance.    Step Over Obstacle Is able to step over 2 stacked shoe boxes taped together (9 in total height) without changing gait speed. No evidence of imbalance.    Gait with Narrow Base of Support Is able to ambulate for 10 steps heel to toe with no staggering.    Gait with Eyes Closed Cannot walk 20 ft without assistance, severe gait deviations or imbalance, deviates greater than 15 in outside 12 in walkway width or will not attempt task.   Deviations to R side   Ambulating Backwards Walks 20 ft, slow speed, abnormal gait pattern, evidence for imbalance, deviates 10-15 in outside 12 in walkway width.   18.9s   Steps Alternating feet, no rail.    Total Score 20    FGA comment: Medium fall risk             TODAY'S TREATMENT:  See below    PATIENT EDUCATION: Education details: eval findings, POC  Person educated: Patient Education method: Explanation, Demonstration, and Verbal cues Education comprehension: verbalized understanding   HOME EXERCISE PROGRAM: To be established next session     GOALS: Goals reviewed with patient? Yes  SHORT TERM GOALS: Target date: 04/08/2022  Pt will be independent with initial HEP for improved strength, balance, transfers and gait.  Baseline: not established on eval  Goal status: INITIAL  2.  6MWT to be assessed and STG/LTG written  Baseline:  Goal status: INITIAL  3.  Pt will improve gait velocity to at least 3.1 ft/s with LRAD for improved gait efficiency   Baseline: 2.92 ft/s without AD Goal status: INITIAL   LONG TERM GOALS: Target date: 05/06/2022  Pt will be independent with final HEP for improved strength, balance, transfers and gait. Baseline:  Goal status: INITIAL  2.  Pt will improve 5 x STS to less than or equal to 11 seconds without UE support to demonstrate improved functional  strength and transfer efficiency.   Baseline: 14.47s without UE support  Goal status: INITIAL  3.  6MWT goal  Baseline:  Goal status: INITIAL  4.  Pt will improve FGA to >/=24/30 for decreased fall risk   Baseline: 20/30 Goal status: INITIAL   ASSESSMENT:  CLINICAL IMPRESSION: Patient is a 66 year old male referred to Neuro OPPT for chronic ischemic CVA. Pt's PMH is significant for: current smoker with a PMHx of hepatitis and cirrhosis, COPD, DM, HTN and prior strokes x 3. The following deficits were present during the exam: decreased strength, decreased endurance and impaired balance. Based on FGA, gait speed and 5x STS, pt is an incr risk for falls. Pt would benefit from skilled PT to address these impairments and functional limitations to maximize functional mobility independence.   OBJECTIVE IMPAIRMENTS Abnormal gait, cardiopulmonary status limiting activity, decreased activity tolerance, decreased balance, decreased cognition, decreased coordination, decreased endurance, decreased knowledge of use of DME, decreased mobility, and decreased strength.   ACTIVITY LIMITATIONS carrying, lifting, bending, squatting, and locomotion level  PARTICIPATION LIMITATIONS: driving  PERSONAL FACTORS Age, Behavior pattern, Fitness, Time since onset of injury/illness/exacerbation, and 1-2 comorbidities: current smoker and COPD  are also affecting patient's functional outcome.   REHAB POTENTIAL: Good  CLINICAL DECISION MAKING: Stable/uncomplicated  EVALUATION COMPLEXITY: Low  PLAN: PT FREQUENCY: 1x/week  PT DURATION: 8 weeks  PLANNED INTERVENTIONS: Therapeutic exercises, Therapeutic activity, Neuromuscular re-education, Balance training, Gait training, Patient/Family education, DME instructions, and Re-evaluation  PLAN FOR NEXT SESSION: 6MWT, establish initial HEP w/vestibular focus (pt drifted significantly to R side w/EC on FGA) and BLE strength.    Cruzita Lederer Genee Rann, PT,  DPT 03/12/2022, 9:42 AM

## 2022-03-11 NOTE — Patient Instructions (Signed)
Aphasia Therapy: How to Practice at Home  Describe what you are doing during your daily routine or chores.  Bathroom: Brushing teeth, washing face Dressing: Items of clothing you want to wear Kitchen: Name the item you are putting away from the sink or dishwasher. Say the steps for making food.  Laundry: Name the item you are folding/putting away.   Look through pictures and describe who, what, where, when, and why for each picture.   Summarize of a news story, TV episode, sporting event or short video you watch online or on TV.   Keep tabs on where you're having trouble communicating while at home:

## 2022-03-18 ENCOUNTER — Ambulatory Visit: Payer: Medicare Other | Admitting: Physical Therapy

## 2022-03-18 ENCOUNTER — Ambulatory Visit: Payer: Medicare Other | Admitting: Speech Pathology

## 2022-03-27 ENCOUNTER — Ambulatory Visit: Payer: Medicare Other | Admitting: Physical Therapy

## 2022-03-27 ENCOUNTER — Ambulatory Visit: Payer: Medicare Other

## 2022-04-01 ENCOUNTER — Ambulatory Visit: Payer: Medicare Other | Admitting: Speech Pathology

## 2022-04-01 ENCOUNTER — Ambulatory Visit: Payer: Medicare Other | Attending: Neurology | Admitting: Physical Therapy

## 2022-04-01 DIAGNOSIS — R2689 Other abnormalities of gait and mobility: Secondary | ICD-10-CM | POA: Diagnosis present

## 2022-04-01 DIAGNOSIS — R4701 Aphasia: Secondary | ICD-10-CM | POA: Diagnosis present

## 2022-04-01 DIAGNOSIS — R2681 Unsteadiness on feet: Secondary | ICD-10-CM | POA: Insufficient documentation

## 2022-04-01 DIAGNOSIS — R41841 Cognitive communication deficit: Secondary | ICD-10-CM | POA: Diagnosis present

## 2022-04-01 NOTE — Therapy (Deleted)
OUTPATIENT SPEECH LANGUAGE PATHOLOGY TREATMENT   Patient Name: Matthew Roman MRN: [id number] DOB:[birth year], 66 y.o., male Today's Date: 04/01/2022  PCP: Matthew Nasuti, MD  REFERRING PROVIDER: Rosalin Hawking, MD      Past Medical History:  Diagnosis Date   Cirrhosis [facility])    COPD (chronic obstructive pulmonary disease) ([place])    Diabetes mellitus without complication ([place])    Hepatitis    Hypertension    Stroke [facility])    Past Surgical History:  Procedure Laterality Date   BACK SURGERY     HIP PINNING,CANNULATED Right 09/04/2018   Procedure: CANNULATED HIP PINNING;  Surgeon: Matthew Bond, MD;  Location: [place];  Service: Orthopedics;  Laterality: Right;   KNEE SURGERY     SHOULDER SURGERY     Patient Active Problem List   Diagnosis Date Noted   Hyperlipidemia 03/09/2022   Chronic obstructive pulmonary disease (COPD) ([place]) 03/09/2022   TIA (transient ischemic attack) 03/07/2022   Essential hypertension 07/29/2020   Hypokalemia 07/29/2020   Encephalopathy, hepatic ([place]) 07/29/2020   DM II (diabetes mellitus, type II), controlled ([place]) 07/29/2020   Visual hallucinations 07/29/2020   History of cirrhosis    Acute encephalopathy 09/02/2018   Multiple lacunar infarcts ([place]) 09/02/2018   Fracture of femoral neck, right, closed ([place]) 09/02/2018   TOBACCO DEPENDENCE 11/19/2006   BACK PAIN, LOW 11/19/2006    ONSET DATE: 03-06-22   REFERRING DIAG: I63.9 (ICD-10-CM) - Acute ischemic stroke (HCC)  THERAPY DIAG:  Aphasia  Cognitive communication deficit  Rationale for Evaluation and Treatment Rehabilitation  SUBJECTIVE:   SUBJECTIVE STATEMENT: *** Pt accompanied by: self  PAIN:  Are you having pain? No  OBJECTIVE:   TODAY'S TREATMENT:  Education on SLP observations and recommendations for therapy. Initiated HEP to facilitate improved discourse level speech. Answered all questions and reviewed goals/POC with pt.    PATIENT EDUCATION: Education details: see  above Person educated: Patient Education method: Explanation, Demonstration, and Handouts Education comprehension: verbalized understanding, returned demonstration, and needs further education   GOALS: Goals reviewed with patient? Yes  SHORT TERM GOALS: Target date: 04/09/2022  Pt will report compliance with HEP over 1 week period to facilitate return to baseline verbal expression with mod-I.  Baseline: Goal status: IN PROGRESS  2.  Pt will teach back compensations and strategies to aid in improved communication efficacy with rate min-A over 2 sessions.  Baseline:  Goal status: IN PROGRESS  3.  Pt will complete structured language tasks, at sentence level or greater, (SFA, VNeST, RET) with 80% accuracy, given occasional mod-A from ST over 2 sessions. Baseline:  Goal status: IN PROGRESS  4.  Pt will improve word-retrieval in personal recount discourse tasks, measured by 3 episodes or less over 5 minute discourse of pt selected topid over 2 sessions. Baseline:  Goal status: IN PROGRESS  LONG TERM GOALS: Target date: 06/04/2022  Pt will report improved communication effectiveness via CPIB by 4 points by d/c.  Baseline: 10 Goal status: IN PROGRESS  2.  Pt will use trained compensations and strategies to present with grossly [facility] discourse level speech during structured language task over 20 minute period with rare occasional mod-A over 2 sessions.  Baseline:  Goal status: IN PROGRESS  3.  Pt will report carryover of trained compensations and strategies during conversations at home, with benefit of subjective feeling of increased communication efficacy with 1 or more communication partners over 1 week period.  Baseline:  Goal status: IN PROGRESS  ASSESSMENT:  CLINICAL IMPRESSION: Patient  is a 66 y.o. M who was seen today for cognitive communication deficit s/p CVA. Pt primary concern is inability and increased difficulty with cohesive verbal expression. Endorses knowing what he  wants to say but having difficulty organizing thoughts/ideas and parlaying them into verbal expression adequate to meet expressive communication needs.  His expressive language was adequate for basic level question response, at one word or phrase level. However, during open-ended question responses and conversational level, Matthew Roman presented with difficulty formulating complete phrase/sentence. Unable to determine if anomia contributing to difficulty with these higher level speech tasks d/t overall decreased verbal output during evaluation. Given simple naming task, pt does present with one semantic paraphasia of "plane" for target "kite," however paraphasias were not observed outside of this naming task. It is plausible that anomia contributed to ability to adequately express needs for expressive language tasks at discourse level. Discourse level tasks notable for usual prolonged pausing, agrammatism, and general resistance to answer open-ended questions. Impairment observed during picture description and procedural discourse tasks. Matthew Roman reports overall reduced communicative engagement at baseline, however is concerned regarding current language skills. Pt denies current difficulties with cognition. I recommend skilled ST to address aphasia and discourse level impairments.   OBJECTIVE IMPAIRMENTS include expressive language and aphasia. These impairments are limiting patient from effectively communicating at home and in community. Factors affecting potential to achieve goals and functional outcome are cooperation/participation level. Patient will benefit from skilled SLP services to address above impairments and improve overall function.  REHAB POTENTIAL: Fair dependent on pt participation and carryover of HEP  PLAN: SLP FREQUENCY: 1-2x/week SLP recommended 2x/week, pt declined; prefers 1x/week at this time.   SLP DURATION: 12 weeks  PLANNED INTERVENTIONS: Language facilitation, Cueing hierachy,  Internal/external aids, Functional tasks, SLP instruction and feedback, Compensatory strategies, and Patient/family education    Su Monks, CCC-SLP 04/01/2022, 1:26 PM

## 2022-04-01 NOTE — Therapy (Signed)
OUTPATIENT PHYSICAL THERAPY NEURO TREATMENT   Patient Name: Matthew Roman MRN: [id number] DOB:[birth year], 65 y.o., male Today's Date: 04/01/2022   PCP: Bonnita Nasuti, MD REFERRING PROVIDER: Rosalin Hawking, MD    PT End of Session - 04/01/22 1408     Visit Number 2    Number of Visits 9   Plus eval   Date for PT Re-Evaluation 06/03/22   Due to delay in scheduling   Authorization Type VA and Medicare    PT Start Time 1406   Previous pt session ran late   PT Stop Time 1443    PT Time Calculation (min) 37 min    Activity Tolerance Patient tolerated treatment well    Behavior During Therapy [facility] for tasks assessed/performed;Flat affect             Past Medical History:  Diagnosis Date   Cirrhosis ([place])    COPD (chronic obstructive pulmonary disease) ([place])    Diabetes mellitus without complication ([place])    Hepatitis    Hypertension    Stroke [facility])    Past Surgical History:  Procedure Laterality Date   BACK SURGERY     HIP PINNING,CANNULATED Right 09/04/2018   Procedure: CANNULATED HIP PINNING;  Surgeon: Marchia Bond, MD;  Location: [place];  Service: Orthopedics;  Laterality: Right;   KNEE SURGERY     SHOULDER SURGERY     Patient Active Problem List   Diagnosis Date Noted   Hyperlipidemia 03/09/2022   Chronic obstructive pulmonary disease (COPD) ([place]) 03/09/2022   TIA (transient ischemic attack) 03/07/2022   Essential hypertension 07/29/2020   Hypokalemia 07/29/2020   Encephalopathy, hepatic ([place]) 07/29/2020   DM II (diabetes mellitus, type II), controlled ([place]) 07/29/2020   Visual hallucinations 07/29/2020   History of cirrhosis    Acute encephalopathy 09/02/2018   Multiple lacunar infarcts ([place]) 09/02/2018   Fracture of femoral neck, right, closed ([place]) 09/02/2018   TOBACCO DEPENDENCE 11/19/2006   BACK PAIN, LOW 11/19/2006    ONSET DATE: 03/09/2022   REFERRING DIAG: I63.9 (ICD-10-CM) - Acute ischemic stroke (HCC)   THERAPY DIAG:  Unsteadiness on  feet  Other abnormalities of gait and mobility  Rationale for Evaluation and Treatment Rehabilitation  SUBJECTIVE:                                                                                                                                                                                              SUBJECTIVE STATEMENT: Pt reports things are going well, no new changes.    PERTINENT HISTORY: current smoker with a PMHx of hepatitis and cirrhosis, COPD, DM, HTN and prior strokes  x 3   PAIN:  Are you having pain? No  PRECAUTIONS: Fall  PATIENT GOALS "I wanna get back to where I was"  OBJECTIVE:   DIAGNOSTIC FINDINGS: MRI on 03/07/22: IMPRESSION: 1. No acute intracranial abnormality. 2. Age-related cerebral atrophy with moderate to advanced chronic microvascular ischemic disease, with multiple superimposed remote lacunar infarcts involving the hemispheric cerebral white matter, left greater than right basal ganglia, and thalami.   TODAY'S TREATMENT:  Ther Act   [facility] PT Assessment - 04/01/22 1410       6 Minute Walk- Baseline   6 Minute Walk- Baseline yes    BP (mmHg) 139/84    HR (bpm) 99    02 Sat (%RA) 96 %    Modified Borg Scale for Dyspnea 0- Nothing at all    Perceived Rate of Exertion (Borg) 6-      6 Minute walk- Post Test   6 Minute Walk Post Test yes    BP (mmHg) 124/75    HR (bpm) 64    02 Sat (%RA) 96 %    Modified Borg Scale for Dyspnea 0.5- Very, very slight shortness of breath    Perceived Rate of Exertion (Borg) 9- very light      6 minute walk test results    Aerobic Endurance Distance Walked 1120    Endurance additional comments Noted decreased step clearance/length of RLE and maintained IR of RUE throughout. No instability noted            Ther Ex  Established and demonstrated initial HEP for improved vestibular function and balance:   - Standing on pillows/dog bed with eyes closed in corner, 4 reps with 30-45 second hold. Minor  posterolateral sway noted throughout   - Standing in corner on dog bed with eyes closed and horizontal head turns 3 reps w/ 30 second hold, noted increased posterolateral sway w/turn to R head   - Standing marches on pillow, x10 reps per side w/2-3 second isometric hold   PATIENT EDUCATION: Education details: Initial HEP, pt requested to discontinue SLP due to "not wanting to", SLP notified  Person educated: Patient Education method: Explanation, Demonstration, and Verbal cues Education comprehension: verbalized understanding   HOME EXERCISE PROGRAM: Access Code: [email or web address] URL: [email or web address] Date: 04/01/2022 Prepared by: Mickie Bail Garlen Reinig  Exercises - Standing on pillows/dog bed with eyes closed in corner   - 1 x daily - 3 x weekly - 1 sets - 4-5 reps - 30-45 second  hold - Standing in corner on with eyes closed and head turns   - 1 x daily - 4 x weekly - 1 sets - 3-5 reps - 30 second hold - Standing marches on pillow   - 1 x daily - 4 x weekly - 3 sets - 10 reps - 2-3 second  hold    GOALS: Goals reviewed with patient? Yes  SHORT TERM GOALS: Target date: 04/08/2022  Pt will be independent with initial HEP for improved strength, balance, transfers and gait.  Baseline: not established on eval  Goal status: INITIAL  2.  6MWT to be assessed and LTG written  Baseline: performed on 7/11  Goal status: MET  3.  Pt will improve gait velocity to at least 3.1 ft/s with LRAD for improved gait efficiency   Baseline: 2.92 ft/s without AD Goal status: INITIAL   LONG TERM GOALS: Target date: 05/06/2022  Pt will be independent with final HEP for improved strength, balance, transfers and gait. Baseline:  Goal status: INITIAL  2.  Pt will improve 5 x STS to less than or equal to 11 seconds without UE support to demonstrate improved functional strength and transfer efficiency.   Baseline: 14.47s without UE support  Goal status: INITIAL  3.  Pt will ambulate  greater than or equal to 1300 feet mod I on 6MWT for improved cardiovascular endurance and BLE strength.   Baseline: 1120' mod I  Goal status: INITIAL  4.  Pt will improve FGA to >/=24/30 for decreased fall risk   Baseline: 20/30 Goal status: INITIAL   ASSESSMENT:  CLINICAL IMPRESSION: Emphasis of skilled PT session on gait assessment and establishing initial HEP. Pt ambulated 1,120' on 6MWT w/no instability noted. LTG written to reflect completion of test. Designed initial HEP for improved vestibular function and pt able to perform well. Will continue POC.   OBJECTIVE IMPAIRMENTS Abnormal gait, cardiopulmonary status limiting activity, decreased activity tolerance, decreased balance, decreased cognition, decreased coordination, decreased endurance, decreased knowledge of use of DME, decreased mobility, and decreased strength.   ACTIVITY LIMITATIONS carrying, lifting, bending, squatting, and locomotion level  PARTICIPATION LIMITATIONS: driving  PERSONAL FACTORS Age, Behavior pattern, Fitness, Time since onset of injury/illness/exacerbation, and 1-2 comorbidities: current smoker and COPD  are also affecting patient's functional outcome.   REHAB POTENTIAL: Good  CLINICAL DECISION MAKING: Stable/uncomplicated  EVALUATION COMPLEXITY: Low  PLAN: PT FREQUENCY: 1x/week  PT DURATION: 8 weeks  PLANNED INTERVENTIONS: Therapeutic exercises, Therapeutic activity, Neuromuscular re-education, Balance training, Gait training, Patient/Family education, DME instructions, and Re-evaluation  PLAN FOR NEXT SESSION: How is HEP? Rockerboard (EO/EC), cone taps from airex, balance beam     Cruzita Lederer Fredick Schlosser, PT, DPT 04/01/2022, 3:56 PM

## 2022-04-01 NOTE — Therapy (Signed)
[facility] [address] [place], [place], [address] Phone: [phone]   Fax:  [phone]  Patient Details  Name: Matthew Roman MRN: [id number] Date of Birth: 03-24-1956 Referring Provider:  Bonnita Nasuti, MD  Encounter Date: 04/01/2022  Arrived to clinic for PT session and reports no longer requiring speech therapy services. Declines to participate in today's scheduled session. SLP to d/c.   SPEECH THERAPY DISCHARGE SUMMARY  Visits from Start of Care: 1  Current functional level related to goals / functional outcomes: Unable to assess, pt did not return for treatment.   Remaining deficits: Unable to assess   Education / Equipment: N/a   Patient agrees to discharge. Patient goals were not met. Patient is being discharged due to the patient's request.   Su Monks, [place] 04/01/2022, 2:37 PM  [facility] [address] [place], [place], [address] Phone: [phone]   Fax:  [phone]

## 2022-04-08 ENCOUNTER — Ambulatory Visit: Payer: No Typology Code available for payment source | Admitting: Physical Therapy

## 2022-04-08 ENCOUNTER — Encounter: Payer: No Typology Code available for payment source | Admitting: Speech Pathology

## 2022-04-15 ENCOUNTER — Encounter: Payer: No Typology Code available for payment source | Admitting: Speech Pathology

## 2022-04-15 ENCOUNTER — Ambulatory Visit: Payer: Medicare Other | Admitting: Physical Therapy

## 2022-04-18 DIAGNOSIS — R4189 Other symptoms and signs involving cognitive functions and awareness: Secondary | ICD-10-CM

## 2022-04-18 DIAGNOSIS — G4733 Obstructive sleep apnea (adult) (pediatric): Secondary | ICD-10-CM | POA: Insufficient documentation

## 2022-04-18 DIAGNOSIS — G300 Alzheimer's disease with early onset: Secondary | ICD-10-CM | POA: Insufficient documentation

## 2022-04-18 DIAGNOSIS — N281 Cyst of kidney, acquired: Secondary | ICD-10-CM | POA: Insufficient documentation

## 2022-04-18 DIAGNOSIS — Z5902 Unsheltered homelessness: Secondary | ICD-10-CM

## 2022-04-18 DIAGNOSIS — K759 Inflammatory liver disease, unspecified: Secondary | ICD-10-CM | POA: Insufficient documentation

## 2022-04-18 DIAGNOSIS — G3184 Mild cognitive impairment, so stated: Secondary | ICD-10-CM | POA: Insufficient documentation

## 2022-04-18 DIAGNOSIS — M25519 Pain in unspecified shoulder: Secondary | ICD-10-CM

## 2022-04-18 DIAGNOSIS — J40 Bronchitis, not specified as acute or chronic: Secondary | ICD-10-CM | POA: Insufficient documentation

## 2022-04-18 DIAGNOSIS — I639 Cerebral infarction, unspecified: Secondary | ICD-10-CM | POA: Insufficient documentation

## 2022-04-18 DIAGNOSIS — S72009A Fracture of unspecified part of neck of unspecified femur, initial encounter for closed fracture: Secondary | ICD-10-CM

## 2022-04-18 DIAGNOSIS — R768 Other specified abnormal immunological findings in serum: Secondary | ICD-10-CM

## 2022-04-18 DIAGNOSIS — M7500 Adhesive capsulitis of unspecified shoulder: Secondary | ICD-10-CM | POA: Insufficient documentation

## 2022-04-18 DIAGNOSIS — E119 Type 2 diabetes mellitus without complications: Secondary | ICD-10-CM | POA: Insufficient documentation

## 2022-04-18 DIAGNOSIS — N400 Enlarged prostate without lower urinary tract symptoms: Secondary | ICD-10-CM | POA: Insufficient documentation

## 2022-04-18 DIAGNOSIS — F0150 Vascular dementia without behavioral disturbance: Secondary | ICD-10-CM | POA: Insufficient documentation

## 2022-04-18 DIAGNOSIS — G4730 Sleep apnea, unspecified: Secondary | ICD-10-CM | POA: Insufficient documentation

## 2022-04-18 DIAGNOSIS — L84 Corns and callosities: Secondary | ICD-10-CM | POA: Insufficient documentation

## 2022-04-18 DIAGNOSIS — R4182 Altered mental status, unspecified: Secondary | ICD-10-CM

## 2022-04-18 DIAGNOSIS — R918 Other nonspecific abnormal finding of lung field: Secondary | ICD-10-CM | POA: Insufficient documentation

## 2022-04-18 DIAGNOSIS — L603 Nail dystrophy: Secondary | ICD-10-CM

## 2022-04-18 DIAGNOSIS — F0390 Unspecified dementia without behavioral disturbance: Secondary | ICD-10-CM | POA: Insufficient documentation

## 2022-04-18 DIAGNOSIS — N200 Calculus of kidney: Secondary | ICD-10-CM

## 2022-04-18 DIAGNOSIS — J069 Acute upper respiratory infection, unspecified: Secondary | ICD-10-CM | POA: Insufficient documentation

## 2022-04-18 DIAGNOSIS — R6889 Other general symptoms and signs: Secondary | ICD-10-CM

## 2022-04-18 DIAGNOSIS — K219 Gastro-esophageal reflux disease without esophagitis: Secondary | ICD-10-CM | POA: Insufficient documentation

## 2022-04-18 DIAGNOSIS — J309 Allergic rhinitis, unspecified: Secondary | ICD-10-CM | POA: Insufficient documentation

## 2022-04-18 DIAGNOSIS — Z658 Other specified problems related to psychosocial circumstances: Secondary | ICD-10-CM | POA: Insufficient documentation

## 2022-04-18 DIAGNOSIS — K7460 Unspecified cirrhosis of liver: Secondary | ICD-10-CM | POA: Insufficient documentation

## 2022-04-18 DIAGNOSIS — B1920 Unspecified viral hepatitis C without hepatic coma: Secondary | ICD-10-CM | POA: Insufficient documentation

## 2022-04-20 NOTE — Progress Notes (Deleted)
Cardiology Office Note:    Date:  04/20/2022   ID:  Matthew Roman, DOB 1956/02/12, MRN [id number]  PCP:  Bonnita Nasuti, MD  Cardiologist:  Shirlee More, MD   Referring MD: Bonnita Nasuti, MD  ASSESSMENT:    No diagnosis found. PLAN:    In order of problems listed above:  ***  Next appointment   Medication Adjustments/Labs and Tests Ordered: Current medicines are reviewed at length with the patient today.  Concerns regarding medicines are outlined above.  No orders of the defined types were placed in this encounter.  No orders of the defined types were placed in this encounter.    No chief complaint on file. ***  History of Present Illness:    Matthew Roman is a 66 y.o. 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 the discharge summary notes he was recommended to have a 30-day event monitor, the endocrinology note from 03/08/2022 recommends an implanted loop recorder.  He was not seen by cardiology as an inpatient.  Apparently there is previous recommendations for outpatient rhythm monitoring with previous stroke January 2019 never performed.  He is advised a 30-day monitor for stroke of cardiac embolism at discharge from hospital 03/09/2022. Stroke: Possible DWI negative left MCA infarct secondary to intracranial atherosclerosis versus cardioembolic Code Stroke CT head No acute abnormality. ASPECTS 10.    CTA head & neck no LVO, left P3 severe stenosis. MRI no acute infarct, chronic left CR, right thalamus lacunar infarcts 2D Echo EF 60 to 65% [place] lowers no DVT Recommend 30-day cardiac event monitor as outpatient to rule out A-fib LDL 80 HgbA1c 5.6 UDS negative VTE prophylaxis - SCD's Aspirin 81 prior to admission, now on ASA and Plavix  DAPT for 3 weeks and then plavix alone 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   Allergic rhinitis 04/18/2022   Altered mental status, unspecified 04/18/2022   Alzheimer's disease with early onset (CODE) ([place]) 04/18/2022   BACK PAIN, LOW 11/19/2006   Qualifier: Diagnosis of  By: Benna Dunks     Benign essential hypertension 07/29/2020   Bronchitis 04/18/2022   Callus 04/18/2022   Cerebrovascular accident [facility]) 04/18/2022   Apr 25, 2020 Entered By: Verlin Grills Comment: MRI 2019 showed multiple infarcts throughout frontal and parietal white matter mixed ageAug 04, 2021 Entered By: Shiela Mayer L Comment: chronic infarcts in multiple other regions with severe chronic microvascular ischemic changes and moderate volume loss of brain   Chronic obstructive pulmonary disease (COPD) ([place]) 03/09/2022   Cirrhosis ([place])    Cognitive impairment 04/18/2022   Diabetes mellitus without complication ([place])    DM II (diabetes mellitus, type II), controlled ([place]) 07/29/2020   Dystrophia unguium 04/18/2022   Dystrophic nail 04/18/2022   Encephalopathy, hepatic ([place]) 07/29/2020   Enlarged prostate 04/18/2022   Femoral neck fracture ([place]) 04/18/2022   Apr 25, 2020 Entered By: Shiela Mayer L Comment: cannulated hip pinning on 09/04/2018   Fracture of femoral neck, right, closed ([place]) 09/02/2018   Gastro-esophageal reflux disease without esophagitis 04/18/2022   Hepatitis    Hepatitis C 04/18/2022   Hepatitis C antibody test positive 04/18/2022   Apr 14, 2016 Entered By: Cena Benton Comment:  SVR 04/08/16   History of cirrhosis    Hyperlipidemia 03/09/2022   Hypokalemia 07/29/2020   Kidney stone 04/18/2022   Low back pain 11/19/2006   Formatting of this note might be different from the original. 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   Mild cognitive impairment of uncertain or unknown etiology 04/18/2022   Multiple lacunar infarcts ([place]) 09/02/2018   Multiple pulmonary nodules 04/18/2022   Obstructive sleep apnea of adult 04/18/2022   Other general symptoms and signs 04/18/2022   Other specified problems related to psychosocial circumstances 04/18/2022   Shoulder joint painful on movement 04/18/2022   Simple renal cyst 04/18/2022   Sleep apnea 04/18/2022   Stroke [facility])    TIA (transient ischemic attack) 03/07/2022   TOBACCO DEPENDENCE 11/19/2006   Qualifier: Diagnosis of  By: Benna Dunks     Tobacco dependence 11/19/2006   Formatting of this note might be different from the original. Overview:  Qualifier: Diagnosis of  By: Benna Dunks   Unsheltered homelessness 04/18/2022   Unspecified dementia, unspecified severity, without behavioral disturbance, psychotic disturbance, mood disturbance, and anxiety ([place]) 04/18/2022   Vascular dementia, unspecified severity, without behavioral disturbance, psychotic disturbance, mood disturbance, and anxiety ([place]) 04/18/2022   Visual hallucinations 07/29/2020    Past Surgical History:  Procedure Laterality Date   BACK SURGERY     HIP PINNING,CANNULATED Right 09/04/2018   Procedure: CANNULATED HIP PINNING;  Surgeon: Marchia Bond, MD;  Location: [facility];  Service: Orthopedics;  Laterality: Right;   KNEE SURGERY     SHOULDER SURGERY      Current Medications: No outpatient medications have been marked as taking for the 04/21/22 encounter (Appointment) with Richardo Priest, MD.     Allergies:   Patient has no known allergies.   Social History   Socioeconomic History   Marital status: Married    Spouse name: Not on file   Number of children: Not on file   Years of education: Not on file   Highest education level: Not on file  Occupational History   Not on file  Tobacco Use   Smoking status: Every Day    Packs/day: 0.50    Types: Cigarettes   Smokeless  tobacco: Never  Vaping Use   Vaping Use: Never used  Substance and Sexual Activity   Alcohol use: Never   Drug use: Never   Sexual activity: Not on file  Other Topics Concern   Not on file  Social History Narrative   Not on file   Social Determinants of Health   Financial Resource Strain: Not on file  Food Insecurity: Not on file  Transportation Needs: Not on file  Physical Activity: Not on file  Stress: Not on file  Social Connections: Not on file     Family History: The patient's ***family history includes Stroke in his mother. There is no history of Osteoporosis.  ROS:   ROS Please see the history of present illness.    *** All other systems reviewed and are negative.  EKGs/Labs/Other Studies Reviewed:    The following studies were reviewed today: ***  EKG:  EKG is *** ordered today.  The ekg ordered today is personally reviewed and demonstrates ***  Recent Labs: 03/06/2022: ALT 34; BUN 12; Creatinine, Ser 1.00; Potassium 4.2; Sodium 143 03/08/2022: Hemoglobin 14.8; Platelets 83  Recent Lipid Panel    Component Value Date/Time   CHOL 134 03/07/2022 0419  TRIG 77 03/07/2022 0419   HDL 39 (L) 03/07/2022 0419   CHOLHDL 3.4 03/07/2022 0419   VLDL 15 03/07/2022 0419   LDLCALC 80 03/07/2022 0419    Physical Exam:    VS:  There were no vitals taken for this visit.    Wt Readings from Last 3 Encounters:  03/07/22 168 lb 10.4 oz (76.5 kg)  08/02/19 177 lb (80.3 kg)  10/26/18 182 lb 6.4 oz (82.7 kg)     GEN: *** Well nourished, well developed in no acute distress HEENT: Normal NECK: No JVD; No carotid bruits LYMPHATICS: No lymphadenopathy CARDIAC: ***RRR, no murmurs, rubs, gallops RESPIRATORY:  Clear to auscultation without rales, wheezing or rhonchi  ABDOMEN: Soft, non-tender, non-distended MUSCULOSKELETAL:  No edema; No deformity  SKIN: Warm and dry NEUROLOGIC:  Alert and oriented x 3 PSYCHIATRIC:  Normal affect     Signed, Shirlee More, MD   04/20/2022 7:22 PM    [facility]

## 2022-04-21 ENCOUNTER — Ambulatory Visit: Payer: No Typology Code available for payment source | Admitting: Cardiology

## 2022-04-22 ENCOUNTER — Ambulatory Visit: Payer: No Typology Code available for payment source | Attending: Neurology | Admitting: Physical Therapy

## 2022-04-22 ENCOUNTER — Encounter: Payer: No Typology Code available for payment source | Admitting: Speech Pathology

## 2022-04-22 ENCOUNTER — Encounter: Payer: Self-pay | Admitting: Physical Therapy

## 2022-04-22 NOTE — Therapy (Signed)
[facility] [address] [place], [place], [address] Phone: [phone]   Fax:  [phone]  Patient Details  Name: Matthew Roman MRN: [id number] Date of Birth: May 08, 1956 Referring Provider:  No ref. provider found  Encounter Date: 04/22/2022  PHYSICAL THERAPY DISCHARGE SUMMARY  Visits from Start of Care: 2  Current functional level related to goals / functional outcomes: Unable to assess. Pt has no-showed PT for 3 weeks and has hung up on therapist when attempting to call to check on pt.    Remaining deficits: Decreased balance, decreased activity tolerance, decreased safety awareness    Education / Equipment: HEP   Patient agrees to discharge. Patient goals were not met. Patient is being discharged due to not returning since the last visit.    Cruzita Lederer Gaynel Schaafsma, PT, DPT 04/22/2022, 3:48 PM  [facility] [address] [place], [place], [address] Phone: [phone]   Fax:  [phone]

## 2022-04-29 ENCOUNTER — Ambulatory Visit: Payer: No Typology Code available for payment source | Admitting: Physical Therapy

## 2022-05-06 ENCOUNTER — Ambulatory Visit: Payer: No Typology Code available for payment source | Admitting: Physical Therapy

## 2022-06-17 ENCOUNTER — Other Ambulatory Visit: Payer: Self-pay

## 2022-06-17 NOTE — Patient Outreach (Signed)
  Care Coordination   06/17/2022 Name: Abdalrahman Clementson MRN: [id number] DOB: 1956/07/19   Telephone outreach to patient to obtain mRS was successfully completed. MRS= [facility] Care Management Assistant [phone]

## 2022-08-04 ENCOUNTER — Other Ambulatory Visit: Payer: Self-pay

## 2022-08-04 ENCOUNTER — Observation Stay (HOSPITAL_COMMUNITY)
Admission: EM | Admit: 2022-08-04 | Discharge: 2022-08-05 | Disposition: A | Discharge status: Home / Self Care | Payer: No Typology Code available for payment source | Attending: Internal Medicine | Admitting: Internal Medicine

## 2022-08-04 ENCOUNTER — Encounter (HOSPITAL_COMMUNITY): Payer: Self-pay | Admitting: *Deleted

## 2022-08-04 ENCOUNTER — Emergency Department (HOSPITAL_COMMUNITY): Payer: No Typology Code available for payment source

## 2022-08-04 DIAGNOSIS — G300 Alzheimer's disease with early onset: Secondary | ICD-10-CM | POA: Diagnosis not present

## 2022-08-04 DIAGNOSIS — I10 Essential (primary) hypertension: Secondary | ICD-10-CM | POA: Insufficient documentation

## 2022-08-04 DIAGNOSIS — Z7902 Long term (current) use of antithrombotics/antiplatelets: Secondary | ICD-10-CM | POA: Diagnosis not present

## 2022-08-04 DIAGNOSIS — J449 Chronic obstructive pulmonary disease, unspecified: Secondary | ICD-10-CM | POA: Insufficient documentation

## 2022-08-04 DIAGNOSIS — Z7982 Long term (current) use of aspirin: Secondary | ICD-10-CM | POA: Diagnosis not present

## 2022-08-04 DIAGNOSIS — R410 Disorientation, unspecified: Secondary | ICD-10-CM | POA: Diagnosis not present

## 2022-08-04 DIAGNOSIS — Z8673 Personal history of transient ischemic attack (TIA), and cerebral infarction without residual deficits: Secondary | ICD-10-CM | POA: Insufficient documentation

## 2022-08-04 DIAGNOSIS — Z79899 Other long term (current) drug therapy: Secondary | ICD-10-CM | POA: Diagnosis not present

## 2022-08-04 DIAGNOSIS — Z7984 Long term (current) use of oral hypoglycemic drugs: Secondary | ICD-10-CM | POA: Insufficient documentation

## 2022-08-04 DIAGNOSIS — F0150 Vascular dementia without behavioral disturbance: Secondary | ICD-10-CM | POA: Diagnosis not present

## 2022-08-04 DIAGNOSIS — E119 Type 2 diabetes mellitus without complications: Secondary | ICD-10-CM | POA: Insufficient documentation

## 2022-08-04 DIAGNOSIS — G9341 Metabolic encephalopathy: Secondary | ICD-10-CM | POA: Diagnosis not present

## 2022-08-04 DIAGNOSIS — F17200 Nicotine dependence, unspecified, uncomplicated: Secondary | ICD-10-CM | POA: Diagnosis not present

## 2022-08-04 DIAGNOSIS — Z85038 Personal history of other malignant neoplasm of large intestine: Secondary | ICD-10-CM | POA: Diagnosis not present

## 2022-08-04 DIAGNOSIS — R4182 Altered mental status, unspecified: Secondary | ICD-10-CM | POA: Diagnosis present

## 2022-08-04 LAB — COMPREHENSIVE METABOLIC PANEL
ALT: 28 U/L (ref 0–44)
AST: 19 U/L (ref 15–41)
Albumin: 4 g/dL (ref 3.5–5.0)
Alkaline Phosphatase: 78 U/L (ref 38–126)
Anion gap: 10 (ref 5–15)
BUN: 12 mg/dL (ref 8–23)
CO2: 24 mmol/L (ref 22–32)
Calcium: 9.2 mg/dL (ref 8.9–10.3)
Chloride: 106 mmol/L (ref 98–111)
Creatinine, Ser: 0.8 mg/dL (ref 0.61–1.24)
GFR, Estimated: >60 mL/min (ref >60)
Glucose, Bld: 135 mg/dL — ABNORMAL HIGH (ref 70–99)
Potassium: 3.9 mmol/L (ref 3.5–5.1)
Sodium: 140 mmol/L (ref 135–145)
Total Bilirubin: 0.8 mg/dL (ref 0.3–1.2)
Total Protein: 6.4 g/dL — ABNORMAL LOW (ref 6.5–8.1)

## 2022-08-04 LAB — CBC WITH DIFFERENTIAL/PLATELET
Abs Immature Granulocytes: 0.05 10*3/uL (ref 0.00–0.07)
Basophils Absolute: 0 10*3/uL (ref 0.0–0.1)
Basophils Relative: 1 %
Eosinophils Absolute: 0 10*3/uL (ref 0.0–0.5)
Eosinophils Relative: 0 %
HCT: 44.1 % (ref 39.0–52.0)
Hemoglobin: 15.1 g/dL (ref 13.0–17.0)
Immature Granulocytes: 1 %
Lymphocytes Relative: 9 %
Lymphs Abs: 0.7 10*3/uL (ref 0.7–4.0)
MCH: 30.3 pg (ref 26.0–34.0)
MCHC: 34.2 g/dL (ref 30.0–36.0)
MCV: 88.4 fL (ref 80.0–100.0)
Monocytes Absolute: 0.4 10*3/uL (ref 0.1–1.0)
Monocytes Relative: 5 %
Neutro Abs: 6.4 10*3/uL (ref 1.7–7.7)
Neutrophils Relative %: 84 %
Platelets: 109 10*3/uL — ABNORMAL LOW (ref 150–400)
RBC: 4.99 MIL/uL (ref 4.22–5.81)
RDW: 13.9 % (ref 11.5–15.5)
WBC: 7.5 10*3/uL (ref 4.0–10.5)
nRBC: 0 % (ref 0.0–0.2)

## 2022-08-04 LAB — URINALYSIS, ROUTINE W REFLEX MICROSCOPIC
Bilirubin Urine: NEGATIVE
Glucose, UA: NEGATIVE mg/dL
Hgb urine dipstick: NEGATIVE
Ketones, ur: 5 mg/dL — AB
Leukocytes,Ua: NEGATIVE
Nitrite: NEGATIVE
Protein, ur: NEGATIVE mg/dL
Specific Gravity, Urine: 1.019 (ref 1.005–1.030)
pH: 5 (ref 5.0–8.0)

## 2022-08-04 LAB — AMMONIA: Ammonia: 21 umol/L (ref 9–35)

## 2022-08-04 MED ORDER — METFORMIN HCL ER 500 MG PO TB24
500.0000 mg | ORAL_TABLET | Freq: Two times a day (BID) | ORAL | Status: DC
  Administered 2022-08-04 – 2022-08-05 (×2): 500 mg via ORAL
  Filled 2022-08-04 (×2): qty 1

## 2022-08-04 MED ORDER — ACETAMINOPHEN 325 MG PO TABS: 650.0000 mg | ORAL_TABLET | Freq: Four times a day (QID) | ORAL | Status: DC | PRN

## 2022-08-04 MED ORDER — LACTULOSE 10 GM/15ML PO SOLN
20.0000 g | Freq: Three times a day (TID) | ORAL | Status: DC
  Administered 2022-08-04 – 2022-08-05 (×2): 20 g via ORAL
  Filled 2022-08-04 (×2): qty 30

## 2022-08-04 MED ORDER — ENOXAPARIN SODIUM 40 MG/0.4ML IJ SOSY
40.0000 mg | PREFILLED_SYRINGE | INTRAMUSCULAR | Status: DC
  Administered 2022-08-04: 40 mg via SUBCUTANEOUS
  Filled 2022-08-04: qty 0.4

## 2022-08-04 MED ORDER — VITAMIN D 25 MCG (1000 UNIT) PO TABS
2000.0000 [IU] | ORAL_TABLET | Freq: Every day | ORAL | Status: DC
  Administered 2022-08-04 – 2022-08-05 (×2): 2000 [IU] via ORAL
  Filled 2022-08-04 (×2): qty 2

## 2022-08-04 MED ORDER — ACETAMINOPHEN 650 MG RE SUPP: 650.0000 mg | Freq: Four times a day (QID) | RECTAL | Status: DC | PRN

## 2022-08-04 MED ORDER — ASPIRIN 81 MG PO TBEC
81.0000 mg | DELAYED_RELEASE_TABLET | Freq: Every day | ORAL | Status: DC
  Administered 2022-08-04 – 2022-08-05 (×2): 81 mg via ORAL
  Filled 2022-08-04 (×2): qty 1

## 2022-08-04 MED ORDER — VITAMIN B-12 1000 MCG PO TABS
1000.0000 ug | ORAL_TABLET | Freq: Every day | ORAL | Status: DC
  Administered 2022-08-04 – 2022-08-05 (×2): 1000 ug via ORAL
  Filled 2022-08-04 (×2): qty 1

## 2022-08-04 MED ORDER — NICOTINE 21 MG/24HR TD PT24
21.0000 mg | MEDICATED_PATCH | Freq: Every day | TRANSDERMAL | Status: DC
  Administered 2022-08-04 – 2022-08-05 (×2): 21 mg via TRANSDERMAL
  Filled 2022-08-04 (×2): qty 1

## 2022-08-04 MED ORDER — ALBUTEROL SULFATE (2.5 MG/3ML) 0.083% IN NEBU: 3.0000 mL | INHALATION_SOLUTION | Freq: Four times a day (QID) | RESPIRATORY_TRACT | Status: DC | PRN

## 2022-08-04 MED ORDER — ATORVASTATIN CALCIUM 40 MG PO TABS
40.0000 mg | ORAL_TABLET | Freq: Every day | ORAL | Status: DC
  Administered 2022-08-04: 40 mg via ORAL
  Filled 2022-08-04: qty 1

## 2022-08-04 NOTE — ED Notes (Signed)
Patient was repositioned and lunch tray was given , however patient states he didn't want to eat.

## 2022-08-04 NOTE — ED Triage Notes (Signed)
Patient presents to  ed via [facility] from home granddaughter states her husband heard a noise and went to fine patient standing between the sink and the commode, then found patient standing in front of the closet naked thinking he was going to the bathroom. Family states patient has dementia however this is different .

## 2022-08-04 NOTE — H&P (Cosign Needed Addendum)
Date: 08/04/2022               Patient Name:  Matthew Roman MRN: [id number]  DOB: [birth year] Age / Sex: 66 y.o., male   PCP: Bonnita Nasuti, MD         Medical Service: Internal Medicine Teaching Service         Attending Physician: Dr. Charise Killian, MD    First Contact: Dr. Gaylyn Rong Pager: [phone]  Second Contact: Dr. Farrel Gordon Pager: [phone]       After Hours (After 5p/  First Contact Pager: [phone]  weekends / holidays): Second Contact Pager: [phone]   Chief Complaint: Altered mental status  History of Present Illness:  Matthew Roman"  Roman is a 66 year old person with a history of prior TIA in June 2023 and bilateral frontal and parietal infarcts in 2019, vascular demantia, type 2 diabetes mellitus, COPD, cirrhosis secondary to hepatitis C and chronic back pain on oxycodone who presents today for altered mental status.  Per family he was in his usual state of health until this morning when he was found in the bathroom.  Noted to have water all around himself and was trying to urinate into the tank of the toilet rather than the bowl.  Daughter notes furniture was moved around and his sheets were thrown off his bed.  Patient appeared confused and acting bizarrely which is trying to walk into a closet.  Since then family notes patient has improved.  Now somewhat restless and somewhat confused but closer to his baseline.  Family notes that he has had difficulty with word finding due to prior strokes.  Baseline he is oriented to self, usually oriented to family members, situation but not to time. He frequently has difficulty with names and word finding.  He is able to complete his ADLs but requires help with his IADLs.  Daughter reports she sets his medication out for him daily but he is in charge of taking them at the appropriate times.  He does notably take oxycodone 10 mg twice daily chronically for back pain.  Daughter is not sure when he took his medications yesterday but  states there is a possibility that he took both doses of his oxycodone at the same time.  Patient is also on lactulose for cirrhosis but does not take this as he dislikes the side effects.  Family is unsure if he had any loss of bladder incontinence but do not think he had any bowel incontinence.  He did not note any seizure-like activity or focal weakness.  They denied any recent changes in his medications .  History is limited due to patient being unable to answer questions consistently.  Family did not note any recent complaints patient had at home.  Current Meds  Medication Sig   albuterol (PROVENTIL) (2.5 MG/3ML) 0.083% nebulizer solution Take 3 mLs (2.5 mg total) by nebulization every 6 (six) hours as needed for wheezing or shortness of breath.   aspirin EC 81 MG tablet Take 81 mg by mouth daily. Swallow whole.   atorvastatin (LIPITOR) 10 MG tablet Take 10 mg by mouth daily.   cholecalciferol (VITAMIN D) 25 MCG tablet Take 2 tablets (2,000 Units total) by mouth daily.   metFORMIN (GLUCOPHAGE-XR) 500 MG 24 hr tablet Take 500 mg by mouth 2 (two) times daily with a meal.   vitamin B-12 (CYANOCOBALAMIN) 500 MCG tablet Take 2 tablets by mouth daily.    Allergies: Allergies as of 08/04/2022   (  No Known Allergies)   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   Allergic rhinitis 04/18/2022   Altered mental status, unspecified 04/18/2022   Alzheimer's disease with early onset (CODE) ([place]) 04/18/2022   BACK PAIN, LOW 11/19/2006   Qualifier: Diagnosis of  By: Benna Dunks     Benign essential hypertension 07/29/2020   Bronchitis 04/18/2022   Callus 04/18/2022   Cerebrovascular accident [facility]) 04/18/2022   Apr 25, 2020 Entered By: Shiela Mayer L Comment: MRI 2019 showed multiple infarcts throughout frontal and parietal white matter mixed ageAug 04, 2021 Entered By:  Shiela Mayer L Comment: chronic infarcts in multiple other regions with severe chronic microvascular ischemic changes and moderate volume loss of brain   Chronic obstructive pulmonary disease (COPD) ([place]) 03/09/2022   Cirrhosis ([place])    Cognitive impairment 04/18/2022   Diabetes mellitus without complication ([place])    DM II (diabetes mellitus, type II), controlled ([place]) 07/29/2020   Dystrophia unguium 04/18/2022   Dystrophic nail 04/18/2022   Encephalopathy, hepatic ([place]) 07/29/2020   Enlarged prostate 04/18/2022   Femoral neck fracture ([place]) 04/18/2022   Apr 25, 2020 Entered By: Shiela Mayer L Comment: cannulated hip pinning on 09/04/2018   Fracture of femoral neck, right, closed ([place]) 09/02/2018   Gastro-esophageal reflux disease without esophagitis 04/18/2022   Hepatitis    Hepatitis C 04/18/2022   Hepatitis C antibody test positive 04/18/2022   Apr 14, 2016 Entered By: Cena [place] Comment: SVR 04/08/16   History of cirrhosis    Hyperlipidemia 03/09/2022   Hypokalemia 07/29/2020   Kidney stone 04/18/2022   Low back pain 11/19/2006   Formatting of this note might be different from the original. 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[place]) 05/07/2017   Mild cognitive impairment of uncertain or unknown etiology 04/18/2022   Multiple lacunar infarcts ([place]) 09/02/2018   Multiple pulmonary nodules 04/18/2022   Obstructive sleep apnea of adult 04/18/2022   Other general symptoms and signs 04/18/2022   Other specified problems related to psychosocial circumstances 04/18/2022   Shoulder joint painful on movement 04/18/2022   Simple renal cyst 04/18/2022   Sleep apnea 04/18/2022   Stroke [facility])    TIA (transient ischemic attack) 03/07/2022   TOBACCO DEPENDENCE 11/19/2006   Qualifier: Diagnosis of  By: Benna Dunks     Tobacco dependence 11/19/2006   Formatting of this note might be different from the original. Overview:  Qualifier: Diagnosis of  By:  Benna Dunks   Unsheltered homelessness 04/18/2022   Unspecified dementia, unspecified severity, without behavioral disturbance, psychotic disturbance, mood disturbance, and anxiety ([place]) 04/18/2022   Vascular dementia, unspecified severity, without behavioral disturbance, psychotic disturbance, mood disturbance, and anxiety ([place]) 04/18/2022   Visual hallucinations 07/29/2020    Family History: No known family history  Social History: Patient lives with daughter and son-in-law Coralyn Mark.  He ambulates dependently and is able to prepare his own meals, dress, bathe and groom himself.  He needs help with driving, medications and other IADLs. Currently smokes about half pack a day with a 32-pack-year history[facility] [place]   Review of Systems: A complete ROS was negative except as per HPI.   Physical Exam: Blood pressure (!) 153/81, pulse 76, temperature 98.7 F (37.1 C), temperature source Oral, resp. rate 16, height 6' (1.829 m), weight 76.5 kg, SpO2 96 %. Constitutional: Fidgety sitting  in bed in no acute distress  HENT: Normocephalic and atraumatic, EOMI, conjunctiva normal, moist mucous membranes Cardiovascular: Normal rate, regular rhythm, S1 and S2 present, no murmurs, rubs, gallops.  Distal pulses intact Respiratory: decreased breath sounds, no wheezing or rales, no increased work of breathing GI: Nondistended, soft, nontender to palpation, normal active bowel sounds. No ascites  Musculoskeletal: Normal bulk and tone.  No peripheral edema noted. Neurological: alert oriented to self and daughter but not to granddaughter, not oriented to time or situation, poor attention, word finding difficulty but no slurred speech, inconsistently answering questions and often with conflicting answerers. no focal weakness, unable to evaluate sensation due to unreliable history, normal finger to nose, no asterixis,  postural tremor of the right hand but non on left.  Skin: Warm and dry.  No  jaundice. No tongue lacerations  CT head 08/04/22 FINDINGS: Brain: Mild chronic ischemic white matter disease is noted. No mass effect or midline shift is noted. Ventricular size is within normal limits. There is no evidence of mass lesion, hemorrhage or acute infarction.   Vascular: No hyperdense vessel or unexpected calcification.   Skull: Normal. Negative for fracture or focal lesion.   Sinuses/Orbits: No acute finding.   Other: None.   IMPRESSION: No acute intracranial abnormality seen.  Assessment & Plan by Problem: Principal Problem:   Acute metabolic encephalopathy  Acute metabolic encephalopathy  Patient presenting with AMS this morning now improving close to baseline. No fever, leukocytosis or localizing symptoms to indicate infection. No new acute neurologic finding to suggest CVA. CT head negative. No seizure on EEG in past not on antiepileptics. No seizure like activity noted. No electrolyte abnormalities on labs.  Etiology of encephalopathy may be factorial with history of vascular dementia, on chronic opioids, possible encephalopathy in the setting of nonadherence to lactulose, and delirium.   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  Daughter reports she does not take this.  Mildly low platelets at 100.  Normal liver enzymes, albumin and bilirubin.  No history of varices or ascites per family.   -holding oxycodone, would avoid narcotics in cirrhosis for pain in futures -Restart lactulose as above -Monitor CMP -Would encourage alcohol cessation  Prior CVA Recent adission for TIA. Presented with aphasia. Has residual aphasia. Went to SLP once but then refused to continue. Is currently only on ASA family was  unaware of recommendation for plavix. Only taking atorvastatin 10 mg as he has no follow [facility] at [facility] regarding this.  -Continue ASA 81 mg daily -Restart atorvastatin 40 mg daily -concern for thromboembolic etiology, family reports patient worse heart monitor but never got results. I do not see any results in the system, will monitor on tele, may need to set up another monitor on discharge  T2DM On metformin 500 mg BID. A1c 5.8% in 2 months ago -continue metformin   Chronic back pain Take oxycodone 10 mg twice daily for this. Would avoid in cirrhosis. -holding oxy in setting of acute encephalopathy -tylenol PRN  COPD Only on albuterol at home as needed.  -albuterol PRN -encourage tobacco cessation  Tobacco use -nicotine patch   Dispo: Admit patient to Observation with expected length of stay less than 2 midnights.  Signed: Iona Beard, MD 08/04/2022, 1:45 PM  Pager:  [phone] After 5pm on weekdays and 1pm on weekends: On Call pager: [phone]

## 2022-08-04 NOTE — ED Notes (Signed)
Transported to CT 

## 2022-08-04 NOTE — ED Notes (Signed)
Called caretaker Shirlean Mylar [phone] update given.

## 2022-08-04 NOTE — ED Notes (Signed)
Patient  went to the bathroom and didn't get a urine spec.

## 2022-08-04 NOTE — ED Notes (Signed)
Lunch tray ordered 

## 2022-08-04 NOTE — ED Provider Notes (Signed)
[facility] EMERGENCY DEPARTMENT Provider Note   CSN: [id number] Arrival date & time: 08/04/22  4193     History  Chief Complaint  Patient presents with   Altered Mental Status    Matthew Roman is a 66 y.o. male.  HPI     66 y.o. male current smoker with a PMHx of hepatitis and cirrhosis, COPD, DM, HTN and prior strokes x 3 who presents to the ED with acute change in mental status.  Patient companied by the caregiver and granddaughter.  According to the caregiver, patient was doing fine last night.  They were watching football together.  In the morning they were woken up by loud noise.  When they went to the bathroom, patient was found naked, the cover for the toilet tank was removed and patient was trying to open closet gait, stating that he had to go urinate.   Patient had come to the emergency room with aphasia last year and there was concerns that he had a stroke.  However the stroke work-up was negative at that time.  Family denies any new medication.  Patient has not had any fevers, there is no history of UTI and patient has not complained of any abdominal pain, nausea, vomiting, chest pain, shortness of breath, URI-like symptoms.  Currently patient is not providing any meaningful history.  Family states that at baseline, patient is able to carry on a conversation and is able to do ADLs himself.   Home Medications Prior to Admission medications   Medication Sig Start Date End Date Taking? Authorizing Provider  albuterol (PROVENTIL) (2.5 MG/3ML) 0.083% nebulizer solution Take 3 mLs (2.5 mg total) by nebulization every 6 (six) hours as needed for wheezing or shortness of breath. 03/09/22   August Albino, NP  amLODipine (NORVASC) 5 MG tablet Take 1 tablet (5 mg total) by mouth daily. 03/09/22 03/09/23  August Albino, NP  atorvastatin (LIPITOR) 40 MG tablet Take 1 tablet (40 mg total) by mouth daily at 6 PM. 03/09/22   August Albino, NP   cholecalciferol (VITAMIN D) 25 MCG tablet Take 2 tablets (2,000 Units total) by mouth daily. 03/10/22   August Albino, NP  clopidogrel (PLAVIX) 75 MG tablet Take 1 tablet (75 mg total) by mouth daily. 03/10/22   August Albino, NP  lactulose (CHRONULAC) 10 GM/15ML solution Take 7.5 mLs (5 g total) by mouth daily. 07/31/20   Gaylan Gerold, DO  metFORMIN (GLUCOPHAGE-XR) 500 MG 24 hr tablet Take 500 mg by mouth 2 (two) times daily with a meal.    [provider]  vitamin B-12 (CYANOCOBALAMIN) 500 MCG tablet Take 2 tablets by mouth daily. 03/19/21   [provider]      Allergies    Patient has no known allergies.    Review of Systems   Review of Systems  Physical Exam Updated Vital Signs BP (!) 153/81   Pulse 76   Temp 98.7 F (37.1 C) (Oral)   Resp 16   Ht 6' (1.829 m)   Wt 76.5 kg   SpO2 96%   BMI 22.87 kg/m  Physical Exam Vitals reviewed.  Constitutional:      Appearance: He is well-developed.     Comments: Somnolent  HENT:     Head: Atraumatic.  Eyes:     Extraocular Movements: Extraocular movements intact.     Pupils: Pupils are equal, round, and reactive to light.  Cardiovascular:     Rate and Rhythm: Normal rate.  Pulmonary:     Effort: Pulmonary effort is normal.  Musculoskeletal:     Cervical back: Neck supple.  Skin:    General: Skin is warm.  Neurological:     Mental Status: He is disoriented.     Comments: Patient somnolent, moving all 4 extremities     ED Results / Procedures / Treatments   Labs (all labs ordered are listed, but only abnormal results are displayed) Labs Reviewed  COMPREHENSIVE METABOLIC PANEL - Abnormal; Notable for the following components:      Result Value   Glucose, Bld 135 (*)    Total Protein 6.4 (*)    All other components within normal limits  CBC WITH DIFFERENTIAL/PLATELET - Abnormal; Notable for the following components:   Platelets 109 (*)    All other components within normal limits  URINALYSIS,  ROUTINE W REFLEX MICROSCOPIC  AMMONIA    EKG None  Radiology CT Head Wo Contrast  Result Date: 08/04/2022 CLINICAL DATA:  Altered mental status. EXAM: CT HEAD WITHOUT CONTRAST TECHNIQUE: Contiguous axial images were obtained from the base of the skull through the vertex without intravenous contrast. RADIATION DOSE REDUCTION: This exam was performed according to the departmental dose-optimization program which includes automated exposure control, adjustment of the mA and/or kV according to patient size and/or use of iterative reconstruction technique. COMPARISON:  March 07, 2022. FINDINGS: Brain: Mild chronic ischemic white matter disease is noted. No mass effect or midline shift is noted. Ventricular size is within normal limits. There is no evidence of mass lesion, hemorrhage or acute infarction. Vascular: No hyperdense vessel or unexpected calcification. Skull: Normal. Negative for fracture or focal lesion. Sinuses/Orbits: No acute finding. Other: None. IMPRESSION: No acute intracranial abnormality seen. Electronically Signed   By: Marijo Conception M.D.   On: 08/04/2022 09:34    Procedures Procedures    Medications Ordered in ED Medications - No data to display  ED Course/ Medical Decision Making/ A&P Clinical Course as of 08/04/22 1130  Mon Aug 04, 2022  1129 CT Head Wo Contrast CT head interpreted independently.  No evidence of brain bleed. [AN]  1129 Patient reassessed.  He is now following commands, has pleasant disposition and able to carry on a conversation.  Patient is able to recognize granddaughter, but had difficulty naming the caregiver.  Family states that he still not completely at baseline.  I discussed the case with internal medicine teaching service, as patient is unassigned to see if they can assess the patient as a consult.  Question is if this is delirium versus other etiologies that require admission.  Admitting service have assessed the patient and recommend  admitting the patient to the hospital.  They will review the medications to look for any polypharmacy that could be contributing. [AN]    Clinical Course User Index [AN] Varney Biles, MD                           Medical Decision Making 66 y.o. male current smoker with a PMHx of hepatitis and cirrhosis, COPD, DM, HTN and prior strokes presents to the emergency room with chief complaint of acute mental status change.  Patient no longer drinks, he smokes half a pack a day. He is been eating and drinking well.  There has not been any fevers, chills.  On exam patient is moving all 4 extremities, but he is confused.  Not answering any questions appropriately.  Family states that patient is  able to carry on a conversation normally.  Differential diagnosis for him includes brain bleed, stroke, severe electrolyte abnormality, delirium secondary to infection, worsening dementia, hepatic encephalopathy.   Amount and/or Complexity of Data Reviewed Labs: ordered. Radiology: ordered.  Risk Decision regarding hospitalization.    Final Clinical Impression(s) / ED Diagnoses Final diagnoses:  Delirium  Altered mental status, unspecified altered mental status type    Rx / DC Orders ED Discharge Orders     None         Varney Biles, MD 08/04/22 1130

## 2022-08-05 DIAGNOSIS — G9341 Metabolic encephalopathy: Secondary | ICD-10-CM | POA: Diagnosis not present

## 2022-08-05 LAB — COMPREHENSIVE METABOLIC PANEL
ALT: 23 U/L (ref 0–44)
AST: 16 U/L (ref 15–41)
Albumin: 3.7 g/dL (ref 3.5–5.0)
Alkaline Phosphatase: 62 U/L (ref 38–126)
Anion gap: 7 (ref 5–15)
BUN: 10 mg/dL (ref 8–23)
CO2: 25 mmol/L (ref 22–32)
Calcium: 9.2 mg/dL (ref 8.9–10.3)
Chloride: 110 mmol/L (ref 98–111)
Creatinine, Ser: 0.74 mg/dL (ref 0.61–1.24)
GFR, Estimated: >60 mL/min (ref >60)
Glucose, Bld: 113 mg/dL — ABNORMAL HIGH (ref 70–99)
Potassium: 3.7 mmol/L (ref 3.5–5.1)
Sodium: 142 mmol/L (ref 135–145)
Total Bilirubin: 0.9 mg/dL (ref 0.3–1.2)
Total Protein: 6 g/dL — ABNORMAL LOW (ref 6.5–8.1)

## 2022-08-05 LAB — CBC
HCT: 42.5 % (ref 39.0–52.0)
Hemoglobin: 13.7 g/dL (ref 13.0–17.0)
MCH: 29.5 pg (ref 26.0–34.0)
MCHC: 32.2 g/dL (ref 30.0–36.0)
MCV: 91.6 fL (ref 80.0–100.0)
Platelets: 100 10*3/uL — ABNORMAL LOW (ref 150–400)
RBC: 4.64 MIL/uL (ref 4.22–5.81)
RDW: 14.1 % (ref 11.5–15.5)
WBC: 5.4 10*3/uL (ref 4.0–10.5)
nRBC: 0 % (ref 0.0–0.2)

## 2022-08-05 LAB — PROTIME-INR
INR: 1.1 (ref 0.8–1.2)
Prothrombin Time: 14.4 seconds (ref 11.4–15.2)

## 2022-08-05 MED ORDER — LACTULOSE 10 GM/15ML PO SOLN: 5.0000 g | Freq: Two times a day (BID) | ORAL | 2 refills | Status: AC

## 2022-08-05 MED ORDER — ATORVASTATIN CALCIUM 40 MG PO TABS: 40.0000 mg | ORAL_TABLET | Freq: Every day | ORAL | 2 refills | Status: AC

## 2022-08-05 MED ORDER — CLOPIDOGREL BISULFATE 75 MG PO TABS: 75.0000 mg | ORAL_TABLET | Freq: Every day | ORAL | 2 refills | Status: AC

## 2022-08-05 NOTE — Discharge Summary (Addendum)
Name: Matthew Roman MRN: [id number] DOB: [birth year] 66 y.o. PCP: Bonnita Nasuti, MD  Date of Admission: 08/04/2022  6:34 AM Date of Discharge: 08/05/22 Attending Physician: Charise Killian, MD  Discharge Diagnosis: 1. Principal Problem:   Acute metabolic encephalopathy  Discharge Medications: Allergies as of 08/05/2022   No Known Allergies      Medication List     STOP taking these medications    aspirin EC 81 MG tablet       TAKE these medications    albuterol (2.5 MG/3ML) 0.083% nebulizer solution Commonly known as: PROVENTIL Take 3 mLs (2.5 mg total) by nebulization every 6 (six) hours as needed for wheezing or shortness of breath.   amLODipine 5 MG tablet Commonly known as: NORVASC Take 1 tablet (5 mg total) by mouth daily.   atorvastatin 40 MG tablet Commonly known as: LIPITOR Take 1 tablet (40 mg total) by mouth daily at 6 PM. What changed: Another medication with the same name was removed. Continue taking this medication, and follow the directions you see here.   clopidogrel 75 MG tablet Commonly known as: PLAVIX Take 1 tablet (75 mg total) by mouth daily.   cyanocobalamin 500 MCG tablet Commonly known as: VITAMIN B12 Take 2 tablets by mouth daily.   lactulose 10 GM/15ML solution Commonly known as: CHRONULAC Take 7.5 mLs (5 g total) by mouth 2 (two) times daily. What changed: when to take this   metFORMIN 500 MG 24 hr tablet Commonly known as: GLUCOPHAGE-XR Take 500 mg by mouth 2 (two) times daily with a meal.   vitamin D3 25 MCG tablet Commonly known as: CHOLECALCIFEROL Take 2 tablets (2,000 Units total) by mouth daily.        Disposition and follow-up:   Matthew Roman was discharged from [facility] in Stable condition.  At the hospital follow up visit please address:  Acute metabolic encephalopathy: Please assess mental status. Consider titrating down oxycodone prescription. Encourage lactulose twice daily, for at  least 3 bowel movements daily.   2.  Labs / imaging needed at time of follow-up: CMP  3.  Pending labs/ test needing follow-up: None   Follow-up Appointments: Patient advised to schedule follow-up visit with PCP at the [facility] within the next 1-2 weeks.   Hospital Course by problem list: Acute metabolic encephalopathy: The patient presented to the ED on 11/13 for altered mental status. His caretaker reports that they found him naked and confused in the bathroom attempting to urinate in the toilet tank that morning. He takes oxycodone 10 mg twice daily for chronic back pain, and may have taken two doses at the same time prior to admission. He was also prescribed lactulose for history of cirrhosis which he does not take at home. In the ED, he was found to be somnolent and disoriented, but otherwise normal physical exam. He had a CT Head negative for acute intracranial abnormality. He also had a negative CMP and CBC with differential. UA was negative for infection. His oxycodone was held, and his lactulose was restarted. Later, his caretaker noted his confusion had improved and he was closer to his baseline. On hospital day 2, he had a neuro/psych exam closer to his reported baseline and his repeat CBC and CMP were negative. His negative workup made acute toxic-metabolic encephalopathy in the setting of chronic opioid use and lactulose non-adherence the most likely diagnosis on the differential.   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 His total protein was decreased to 6.4, and platelets decreased to 109, consistent with his history of cirrhosis. Per family, he does not take his lactulose at home. He was restarted on lactulose during admission, and was encouraged to continue after discharge.  Prior CVA: He was continued on aspirin 81 mg and restarted atorvastatin 40 mg. At discharge, prescription for plavix was resent to continue  outpatient, and aspirin was discontinued.    T2DM: Blood sugar on admission was 135. He was restarted on Metformin 500 mg BID, and blood sugar at discharge was 113.   Chronic back pain: Oxycodone was held on admission. The patient was put on Tylenol 650 mg PRN for pain control. He required no PRNs during admission.   COPD: The patient was continued on albuterol PRN during admission. He required no PRNs during admission.   Tobacco use: The patient smoked 1/2 pack of cigarettes prior to admission. He was given nicotine patch during admission.    Subjective: The patient reports he feels less confused today. He is able to state his name, his location in the hospital (unsure which hospital or city), and his reason for presentation. He did not know the month or the year, which is his baseline due to history of vascular dementia. He reports his caretaker helps with his medications. Discussed importance of taking his lactulose as prescribed.   Discharge Exam:   BP (!) 154/95 (BP Location: Left Arm)   Pulse 76   Temp 98.1 F (36.7 C) (Oral)   Resp 16   Ht 6' (1.829 m)   Wt 76.5 kg   SpO2 96%   BMI 22.87 kg/m  Discharge exam:  General: Appears to be resting in bed comfortably; No acute distress Cardiovascular: RRR, no murmurs, rubs, or gallops; 2+ radial pulses Pulmonary: Normal work of breathing; Lungs clear to auscultation bilaterally; No wheezing, rales or rhonchi Abdomen: Hepatomegaly; Soft, non-distended, non-tender; Normal active bowel sounds; No masses  Extremities: No asterixis  Neuro/Psych: Alert and oriented to person and situation, not fully oriented to location (knows he is in the hospital, but unclear which one and which city) and time; No focal deficits; Able to sustain attention and answer questions appropriately  Skin: Warm and dry. No jaundice.   Pertinent Labs, Studies, and Procedures:  CT Head Wo Contrast  Result Date: 08/04/2022 CLINICAL DATA:  Altered mental status.  EXAM: CT HEAD WITHOUT CONTRAST TECHNIQUE: Contiguous axial images were obtained from the base of the skull through the vertex without intravenous contrast. RADIATION DOSE REDUCTION: This exam was performed according to the departmental dose-optimization program which includes automated exposure control, adjustment of the mA and/or kV according to patient size and/or use of iterative reconstruction technique. COMPARISON:  March 07, 2022. FINDINGS: Brain: Mild chronic ischemic white matter disease is noted. No mass effect or midline shift is noted. Ventricular size is within normal limits. There is no evidence of mass lesion, hemorrhage or acute infarction. Vascular: No hyperdense vessel or unexpected calcification. Skull: Normal. Negative for fracture or focal lesion. Sinuses/Orbits: No acute finding. Other: None. IMPRESSION: No acute intracranial abnormality seen. Electronically Signed   By: Marijo Conception M.D.   On: 08/04/2022 09:34       Latest Ref Rng & Units 08/05/2022    4:36 AM 08/04/2022    7:50 AM 03/08/2022    9:19 AM  CBC  WBC 4.0 - 10.5 K/uL 5.4  7.5  6.0   Hemoglobin 13.0 - 17.0 g/dL  13.7  15.1  14.8   Hematocrit 39.0 - 52.0 % 42.5  44.1  43.9   Platelets 150 - 400 K/uL 100  109  83        Latest Ref Rng & Units 08/05/2022    4:36 AM 08/04/2022    7:50 AM 03/06/2022    9:15 PM  CMP  Glucose 70 - 99 mg/dL 113  135  87   BUN 8 - 23 mg/dL '10  12  12   '$ Creatinine 0.61 - 1.24 mg/dL 0.74  0.80  1.00   Sodium 135 - 145 mmol/L 142  140  143   Potassium 3.5 - 5.1 mmol/L 3.7  3.9  4.2   Chloride 98 - 111 mmol/L 110  106  105   CO2 22 - 32 mmol/L 25  24    Calcium 8.9 - 10.3 mg/dL 9.2  9.2    Total Protein 6.5 - 8.1 g/dL 6.0  6.4    Total Bilirubin 0.3 - 1.2 mg/dL 0.9  0.8    Alkaline Phos 38 - 126 U/L 62  78    AST 15 - 41 U/L 16  19    ALT 0 - 44 U/L 23  28        Discharge Instructions: Discharge Instructions     Diet - low sodium heart healthy   Complete by: As directed     Discharge instructions   Complete by: As directed    Matthew Roman,  It was a pleasure to care for you during your time at [facility] were admitted because you had confusion, but you are better now!  When you go home you will increase the dose of your lactulose medication to twice daily. You should have at least 3 bowel movements per day, and I recommend increasing the dose to three times daily if you are not meeting that frequency with twice daily dosing. I have also sent in a refill for your plavix and atorvastatin medications.  Please contact the [facility] to schedule a follow up appointment with your primary doctor in the next 1-2 weeks.  My best, Dr. Marlou Sa   Increase activity slowly   Complete by: As directed        Signed: Paulo Fruit, Medical Student 08/05/2022, 2:17 PM

## 2022-08-05 NOTE — ED Notes (Signed)
ED TO INPATIENT HANDOFF REPORT  ED Nurse Name and Phone #: [phone]  S Name/Age/Gender Matthew Roman 66 y.o. male Room/Bed: H011C/H011C  Code Status   Code Status: Full Code  Home/SNF/Other Home Patient oriented to: self and place Is this baseline? Yes   Triage Complete: Triage complete  Chief Complaint Acute metabolic encephalopathy [K93.26]  Triage Note Patient presents to  ed via [facility] from home granddaughter states her husband heard a noise and went to fine patient standing between the sink and the commode, then found patient standing in front of the closet naked thinking he was going to the bathroom. Family states patient has dementia however this is different .    Allergies No Known Allergies  Level of Care/Admitting Diagnosis ED Disposition     ED Disposition  Admit   Condition  --   Comment  Hospital Area: [facility] [[id number]]  Level of Care: Telemetry Medical [104]  May place patient in observation at [facility] or [facility] if equivalent level of care is available:: No  Covid Evaluation: Asymptomatic - no recent exposure (last 10 days) testing not required  Diagnosis: Acute metabolic encephalopathy [[id number]]  Admitting Physician: Charise Killian [[id number]]  Attending Physician: Charise Killian [[id number]]          B Medical/Surgery History 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   Allergic rhinitis 04/18/2022   Altered mental status, unspecified 04/18/2022   Alzheimer's disease with early onset (CODE) ([place]) 04/18/2022   BACK PAIN, LOW 11/19/2006   Qualifier: Diagnosis of  By: Benna Dunks     Benign essential hypertension 07/29/2020   Bronchitis 04/18/2022   Callus 04/18/2022   Cerebrovascular accident ([place]) 04/18/2022   Apr 25, 2020 Entered By: Verlin Grills Comment: MRI 2019 showed multiple infarcts  throughout frontal and parietal white matter mixed ageAug 04, 2021 Entered By: Shiela Mayer L Comment: chronic infarcts in multiple other regions with severe chronic microvascular ischemic changes and moderate volume loss of brain   Chronic obstructive pulmonary disease (COPD) ([place]) 03/09/2022   Cirrhosis ([place])    Cognitive impairment 04/18/2022   Diabetes mellitus without complication ([place])    DM II (diabetes mellitus, type II), controlled ([place]) 07/29/2020   Dystrophia unguium 04/18/2022   Dystrophic nail 04/18/2022   Encephalopathy, hepatic ([place]) 07/29/2020   Enlarged prostate 04/18/2022   Femoral neck fracture ([place]) 04/18/2022   Apr 25, 2020 Entered By: Shiela Mayer L Comment: cannulated hip pinning on 09/04/2018   Fracture of femoral neck, right, closed ([place]) 09/02/2018   Gastro-esophageal reflux disease without esophagitis 04/18/2022   Hepatitis    Hepatitis C 04/18/2022   Hepatitis C antibody test positive 04/18/2022   Apr 14, 2016 Entered By: Cena Benton Comment: SVR 04/08/16   History of cirrhosis    Hyperlipidemia 03/09/2022   Hypokalemia 07/29/2020   Kidney stone 04/18/2022   Low back pain 11/19/2006   Formatting of this note might be different from the original. 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[place]) 05/07/2017   Mild cognitive impairment of uncertain or unknown etiology 04/18/2022   Multiple lacunar infarcts ([place]) 09/02/2018   Multiple pulmonary nodules 04/18/2022   Obstructive sleep apnea of adult 04/18/2022   Other general symptoms and signs 04/18/2022   Other specified problems related to psychosocial circumstances 04/18/2022   Shoulder joint painful on movement  04/18/2022   Simple renal cyst 04/18/2022   Sleep apnea 04/18/2022   Stroke [facility])    TIA (transient ischemic attack) 03/07/2022   TOBACCO DEPENDENCE 11/19/2006   Qualifier: Diagnosis of  By: Benna Dunks     Tobacco dependence 11/19/2006   Formatting of this note  might be different from the original. Overview:  Qualifier: Diagnosis of  By: Benna Dunks   Unsheltered homelessness 04/18/2022   Unspecified dementia, unspecified severity, without behavioral disturbance, psychotic disturbance, mood disturbance, and anxiety ([place]) 04/18/2022   Vascular dementia, unspecified severity, without behavioral disturbance, psychotic disturbance, mood disturbance, and anxiety ([place]) 04/18/2022   Visual hallucinations 07/29/2020   Past Surgical History:  Procedure Laterality Date   BACK SURGERY     HIP PINNING,CANNULATED Right 09/04/2018   Procedure: CANNULATED HIP PINNING;  Surgeon: Marchia Bond, MD;  Location: [facility];  Service: Orthopedics;  Laterality: Right;   KNEE SURGERY     SHOULDER SURGERY       A IV Location/Drains/Wounds Patient Lines/Drains/Airways Status     Active Line/Drains/Airways     Name Placement date Placement time Site Days   Peripheral IV 08/04/22 20 G Anterior;Right Forearm 08/04/22  0700  Forearm  1            Intake/Output Last 24 hours No intake or output data in the 24 hours ending 08/05/22 1118  Labs/Imaging Results for orders placed or performed during the hospital encounter of 08/04/22 (from the past 48 hour(s))  Comprehensive metabolic panel     Status: Abnormal   Collection Time: 08/04/22  7:50 AM  Result Value Ref Range   Sodium 140 135 - 145 mmol/L   Potassium 3.9 3.5 - 5.1 mmol/L   Chloride 106 98 - 111 mmol/L   CO2 24 22 - 32 mmol/L   Glucose, Bld 135 (H) 70 - 99 mg/dL    Comment: Glucose reference range applies only to samples taken after fasting for at least 8 hours.   BUN 12 8 - 23 mg/dL   Creatinine, Ser 0.80 0.61 - 1.24 mg/dL   Calcium 9.2 8.9 - 10.3 mg/dL   Total Protein 6.4 (L) 6.5 - 8.1 g/dL   Albumin 4.0 3.5 - 5.0 g/dL   AST 19 15 - 41 U/L   ALT 28 0 - 44 U/L   Alkaline Phosphatase 78 38 - 126 U/L   Total Bilirubin 0.8 0.3 - 1.2 mg/dL   GFR, Estimated >60 >60 mL/min    Comment: (NOTE) Calculated  using the CKD-EPI Creatinine Equation (2021)    Anion gap 10 5 - 15    Comment: Performed at [facility] [address] [place], [place] [address]  CBC with Differential     Status: Abnormal   Collection Time: 08/04/22  7:50 AM  Result Value Ref Range   WBC 7.5 4.0 - 10.5 K/uL   RBC 4.99 4.22 - 5.81 MIL/uL   Hemoglobin 15.1 13.0 - 17.0 g/dL   HCT 44.1 39.0 - 52.0 %   MCV 88.4 80.0 - 100.0 fL   MCH 30.3 26.0 - 34.0 pg   MCHC 34.2 30.0 - 36.0 g/dL   RDW 13.9 11.5 - 15.5 %   Platelets 109 (L) 150 - 400 K/uL    Comment: REPEATED TO VERIFY   nRBC 0.0 0.0 - 0.2 %   Neutrophils Relative % 84 %   Neutro Abs 6.4 1.7 - 7.7 K/uL   Lymphocytes Relative 9 %   Lymphs Abs 0.7 0.7 -  4.0 K/uL   Monocytes Relative 5 %   Monocytes Absolute 0.4 0.1 - 1.0 K/uL   Eosinophils Relative 0 %   Eosinophils Absolute 0.0 0.0 - 0.5 K/uL   Basophils Relative 1 %   Basophils Absolute 0.0 0.0 - 0.1 K/uL   Immature Granulocytes 1 %   Abs Immature Granulocytes 0.05 0.00 - 0.07 K/uL    Comment: Performed at [facility] [address] [place], [place] [address]  Ammonia     Status: None   Collection Time: 08/04/22 11:30 AM  Result Value Ref Range   Ammonia 21 9 - 35 umol/L    Comment: Performed at [facility], [place] [address] [place], [place] [address]  Urinalysis, Routine w reflex microscopic     Status: Abnormal   Collection Time: 08/04/22  5:34 PM  Result Value Ref Range   Color, Urine YELLOW YELLOW   APPearance CLEAR CLEAR   Specific Gravity, Urine 1.019 1.005 - 1.030   pH 5.0 5.0 - 8.0   Glucose, UA NEGATIVE NEGATIVE mg/dL   Hgb urine dipstick NEGATIVE NEGATIVE   Bilirubin Urine NEGATIVE NEGATIVE   Ketones, ur 5 (A) NEGATIVE mg/dL   Protein, ur NEGATIVE NEGATIVE mg/dL   Nitrite NEGATIVE NEGATIVE   Leukocytes,Ua NEGATIVE NEGATIVE    Comment: Performed at [facility] [address], [place], [place] [address]  CBC     Status: Abnormal   Collection Time: 08/05/22   4:36 AM  Result Value Ref Range   WBC 5.4 4.0 - 10.5 K/uL   RBC 4.64 4.22 - 5.81 MIL/uL   Hemoglobin 13.7 13.0 - 17.0 g/dL   HCT 42.5 39.0 - 52.0 %   MCV 91.6 80.0 - 100.0 fL   MCH 29.5 26.0 - 34.0 pg   MCHC 32.2 30.0 - 36.0 g/dL   RDW 14.1 11.5 - 15.5 %   Platelets 100 (L) 150 - 400 K/uL   nRBC 0.0 0.0 - 0.2 %    Comment: Performed at [facility], [place] [address] [place], [place] [address]  Comprehensive metabolic panel     Status: Abnormal   Collection Time: 08/05/22  4:36 AM  Result Value Ref Range   Sodium 142 135 - 145 mmol/L   Potassium 3.7 3.5 - 5.1 mmol/L   Chloride 110 98 - 111 mmol/L   CO2 25 22 - 32 mmol/L   Glucose, Bld 113 (H) 70 - 99 mg/dL    Comment: Glucose reference range applies only to samples taken after fasting for at least 8 hours.   BUN 10 8 - 23 mg/dL   Creatinine, Ser 0.74 0.61 - 1.24 mg/dL   Calcium 9.2 8.9 - 10.3 mg/dL   Total Protein 6.0 (L) 6.5 - 8.1 g/dL   Albumin 3.7 3.5 - 5.0 g/dL   AST 16 15 - 41 U/L   ALT 23 0 - 44 U/L   Alkaline Phosphatase 62 38 - 126 U/L   Total Bilirubin 0.9 0.3 - 1.2 mg/dL   GFR, Estimated >60 >60 mL/min    Comment: (NOTE) Calculated using the CKD-EPI Creatinine Equation (2021)    Anion gap 7 5 - 15    Comment: Performed at [facility] [address] [place], [place] [address]  Protime-INR     Status: None   Collection Time: 08/05/22  4:36 AM  Result Value Ref Range   Prothrombin Time 14.4 11.4 - 15.2 seconds   INR 1.1 0.8 -  1.2    Comment: (NOTE) INR goal varies based on device and disease states. Performed at [facility], [place] [address] [place], [place] [address]    CT Head Wo Contrast  Result Date: 08/04/2022 CLINICAL DATA:  Altered mental status. EXAM: CT HEAD WITHOUT CONTRAST TECHNIQUE: Contiguous axial images were obtained from the base of the skull through the vertex without intravenous contrast. RADIATION DOSE REDUCTION: This exam was performed according to the departmental  dose-optimization program which includes automated exposure control, adjustment of the mA and/or kV according to patient size and/or use of iterative reconstruction technique. COMPARISON:  March 07, 2022. FINDINGS: Brain: Mild chronic ischemic white matter disease is noted. No mass effect or midline shift is noted. Ventricular size is within normal limits. There is no evidence of mass lesion, hemorrhage or acute infarction. Vascular: No hyperdense vessel or unexpected calcification. Skull: Normal. Negative for fracture or focal lesion. Sinuses/Orbits: No acute finding. Other: None. IMPRESSION: No acute intracranial abnormality seen. Electronically Signed   By: Marijo Conception M.D.   On: 08/04/2022 09:34    Pending Labs Unresulted Labs (From admission, onward)    None       Vitals/Pain Today's Vitals   08/04/22 1736 08/04/22 2223 08/05/22 0439 08/05/22 0834  BP: 135/76 (!) 142/80 (!) 146/71 (!) 141/95  Pulse: (!) 56 65 61 62  Resp: '18 16 18 16  '$ Temp: 97.8 F (36.6 C) 98.2 F (36.8 C) 98 F (36.7 C) 98 F (36.7 C)  TempSrc: Oral Oral Oral Oral  SpO2: 100% 96% 95% 96%  Weight:      Height:      PainSc:    0-No pain    Isolation Precautions No active isolations  Medications Medications  enoxaparin (LOVENOX) injection 40 mg (40 mg Subcutaneous Given 08/04/22 1330)  acetaminophen (TYLENOL) tablet 650 mg (has no administration in time range)    Or  acetaminophen (TYLENOL) suppository 650 mg (has no administration in time range)  aspirin EC tablet 81 mg (81 mg Oral Given 08/05/22 0827)  atorvastatin (LIPITOR) tablet 40 mg (40 mg Oral Given 08/04/22 1730)  cholecalciferol (VITAMIN D3) 25 MCG (1000 UNIT) tablet 2,000 Units (2,000 Units Oral Given 08/05/22 0827)  metFORMIN (GLUCOPHAGE-XR) 24 hr tablet 500 mg (500 mg Oral Given 08/05/22 0827)  cyanocobalamin (VITAMIN B12) tablet 1,000 mcg (1,000 mcg Oral Given 08/05/22 0827)  albuterol (PROVENTIL) (2.5 MG/3ML) 0.083% nebulizer solution 3  mL (has no administration in time range)  nicotine (NICODERM CQ - dosed in mg/24 hours) patch 21 mg (21 mg Transdermal Patch Applied 08/05/22 0826)  lactulose (CHRONULAC) 10 GM/15ML solution 20 g (20 g Oral Given 08/05/22 0826)    Mobility walks Moderate fall risk   Focused Assessments Neuro Assessment Handoff:  Swallow screen pass? Yes          Neuro Assessment: Exceptions to WDL Neuro Checks:      Last Documented NIHSS Modified Score:   Has TPA been given? No If patient is a Neuro Trauma and patient is going to OR before floor call report to [facility] nurse: [phone] or [phone]   R Recommendations: See Admitting Provider Note  Report given to:   Additional Notes:

## 2022-08-05 NOTE — ED Notes (Signed)
Granddaughter and caretaker given an update on plan of care.

## 2023-10-12 ENCOUNTER — Emergency Department (HOSPITAL_COMMUNITY): Payer: No Typology Code available for payment source

## 2023-10-12 ENCOUNTER — Encounter (HOSPITAL_COMMUNITY): Payer: Self-pay

## 2023-10-12 ENCOUNTER — Emergency Department (HOSPITAL_COMMUNITY)
Admission: EM | Admit: 2023-10-12 | Discharge: 2023-10-12 | Disposition: A | Discharge status: Home / Self Care | Payer: No Typology Code available for payment source | Attending: Emergency Medicine | Admitting: Emergency Medicine

## 2023-10-12 DIAGNOSIS — R410 Disorientation, unspecified: Secondary | ICD-10-CM | POA: Diagnosis not present

## 2023-10-12 DIAGNOSIS — E119 Type 2 diabetes mellitus without complications: Secondary | ICD-10-CM | POA: Diagnosis not present

## 2023-10-12 DIAGNOSIS — Z85038 Personal history of other malignant neoplasm of large intestine: Secondary | ICD-10-CM | POA: Diagnosis not present

## 2023-10-12 DIAGNOSIS — Z7901 Long term (current) use of anticoagulants: Secondary | ICD-10-CM | POA: Insufficient documentation

## 2023-10-12 DIAGNOSIS — I10 Essential (primary) hypertension: Secondary | ICD-10-CM | POA: Insufficient documentation

## 2023-10-12 DIAGNOSIS — R0682 Tachypnea, not elsewhere classified: Secondary | ICD-10-CM | POA: Diagnosis present

## 2023-10-12 DIAGNOSIS — R4182 Altered mental status, unspecified: Secondary | ICD-10-CM | POA: Diagnosis not present

## 2023-10-12 DIAGNOSIS — J449 Chronic obstructive pulmonary disease, unspecified: Secondary | ICD-10-CM | POA: Diagnosis not present

## 2023-10-12 DIAGNOSIS — U071 COVID-19: Secondary | ICD-10-CM | POA: Insufficient documentation

## 2023-10-12 DIAGNOSIS — R109 Unspecified abdominal pain: Secondary | ICD-10-CM | POA: Insufficient documentation

## 2023-10-12 DIAGNOSIS — Z7982 Long term (current) use of aspirin: Secondary | ICD-10-CM | POA: Insufficient documentation

## 2023-10-12 DIAGNOSIS — Z8673 Personal history of transient ischemic attack (TIA), and cerebral infarction without residual deficits: Secondary | ICD-10-CM | POA: Diagnosis not present

## 2023-10-12 DIAGNOSIS — F0390 Unspecified dementia without behavioral disturbance: Secondary | ICD-10-CM | POA: Diagnosis not present

## 2023-10-12 LAB — CBC WITH DIFFERENTIAL/PLATELET
Abs Immature Granulocytes: 0.02 10*3/uL (ref 0.00–0.07)
Basophils Absolute: 0 10*3/uL (ref 0.0–0.1)
Basophils Relative: 1 %
Eosinophils Absolute: 0 10*3/uL (ref 0.0–0.5)
Eosinophils Relative: 0 %
HCT: 46.7 % (ref 39.0–52.0)
Hemoglobin: 15.9 g/dL (ref 13.0–17.0)
Immature Granulocytes: 0 %
Lymphocytes Relative: 4 %
Lymphs Abs: 0.3 10*3/uL — ABNORMAL LOW (ref 0.7–4.0)
MCH: 31.2 pg (ref 26.0–34.0)
MCHC: 34 g/dL (ref 30.0–36.0)
MCV: 91.7 fL (ref 80.0–100.0)
Monocytes Absolute: 0.5 10*3/uL (ref 0.1–1.0)
Monocytes Relative: 9 %
Neutro Abs: 5.4 10*3/uL (ref 1.7–7.7)
Neutrophils Relative %: 86 %
Platelets: 107 10*3/uL — ABNORMAL LOW (ref 150–400)
RBC: 5.09 MIL/uL (ref 4.22–5.81)
RDW: 13.3 % (ref 11.5–15.5)
WBC: 6.2 10*3/uL (ref 4.0–10.5)
nRBC: 0 % (ref 0.0–0.2)

## 2023-10-12 LAB — RESP PANEL BY RT-PCR (RSV, FLU A&B, COVID)  RVPGX2
Influenza A by PCR: NEGATIVE
Influenza B by PCR: NEGATIVE
Resp Syncytial Virus by PCR: NEGATIVE
SARS Coronavirus 2 by RT PCR: POSITIVE — AB

## 2023-10-12 LAB — I-STAT CHEM 8, ED
BUN: 12 mg/dL (ref 8–23)
Calcium, Ion: 1.14 mmol/L — ABNORMAL LOW (ref 1.15–1.40)
Chloride: 105 mmol/L (ref 98–111)
Creatinine, Ser: 0.9 mg/dL (ref 0.61–1.24)
Glucose, Bld: 144 mg/dL — ABNORMAL HIGH (ref 70–99)
HCT: 48 % (ref 39.0–52.0)
Hemoglobin: 16.3 g/dL (ref 13.0–17.0)
Potassium: 4.1 mmol/L (ref 3.5–5.1)
Sodium: 140 mmol/L (ref 135–145)
TCO2: 26 mmol/L (ref 22–32)

## 2023-10-12 LAB — COMPREHENSIVE METABOLIC PANEL
ALT: 41 U/L (ref 0–44)
AST: 28 U/L (ref 15–41)
Albumin: 4.3 g/dL (ref 3.5–5.0)
Alkaline Phosphatase: 101 U/L (ref 38–126)
Anion gap: 15 (ref 5–15)
BUN: 9 mg/dL (ref 8–23)
CO2: 19 mmol/L — ABNORMAL LOW (ref 22–32)
Calcium: 9.2 mg/dL (ref 8.9–10.3)
Chloride: 103 mmol/L (ref 98–111)
Creatinine, Ser: 0.9 mg/dL (ref 0.61–1.24)
GFR, Estimated: >60 mL/min (ref >60)
Glucose, Bld: 144 mg/dL — ABNORMAL HIGH (ref 70–99)
Potassium: 3.9 mmol/L (ref 3.5–5.1)
Sodium: 137 mmol/L (ref 135–145)
Total Bilirubin: 1.1 mg/dL (ref 0.0–1.2)
Total Protein: 7.2 g/dL (ref 6.5–8.1)

## 2023-10-12 LAB — I-STAT VENOUS BLOOD GAS, ED
Acid-Base Excess: 2 mmol/L (ref 0.0–2.0)
Bicarbonate: 25.9 mmol/L (ref 20.0–28.0)
Calcium, Ion: 1.14 mmol/L — ABNORMAL LOW (ref 1.15–1.40)
HCT: 48 % (ref 39.0–52.0)
Hemoglobin: 16.3 g/dL (ref 13.0–17.0)
O2 Saturation: 89 %
Potassium: 4.1 mmol/L (ref 3.5–5.1)
Sodium: 140 mmol/L (ref 135–145)
TCO2: 27 mmol/L (ref 22–32)
pCO2, Ven: 38.8 mm[Hg] — ABNORMAL LOW (ref 44–60)
pH, Ven: 7.432 — ABNORMAL HIGH (ref 7.25–7.43)
pO2, Ven: 55 mm[Hg] — ABNORMAL HIGH (ref 32–45)

## 2023-10-12 LAB — PROTIME-INR
INR: 1.1 (ref 0.8–1.2)
Prothrombin Time: 14.7 s (ref 11.4–15.2)

## 2023-10-12 LAB — TROPONIN I (HIGH SENSITIVITY)
Troponin I (High Sensitivity): 8 ng/L (ref <18)
Troponin I (High Sensitivity): 9 ng/L (ref <18)

## 2023-10-12 LAB — I-STAT CG4 LACTIC ACID, ED: Lactic Acid, Venous: 1.6 mmol/L (ref 0.5–1.9)

## 2023-10-12 LAB — MAGNESIUM: Magnesium: 1.6 mg/dL — ABNORMAL LOW (ref 1.7–2.4)

## 2023-10-12 LAB — BRAIN NATRIURETIC PEPTIDE: B Natriuretic Peptide: 41.7 pg/mL (ref 0.0–100.0)

## 2023-10-12 LAB — CBG MONITORING, ED: Glucose-Capillary: 119 mg/dL — ABNORMAL HIGH (ref 70–99)

## 2023-10-12 LAB — AMMONIA: Ammonia: 29 umol/L (ref 9–35)

## 2023-10-12 MED ORDER — MAGNESIUM SULFATE 2 GM/50ML IV SOLN: 2.0000 g | Freq: Once | INTRAVENOUS | Status: DC

## 2023-10-12 MED ORDER — DEXAMETHASONE SODIUM PHOSPHATE 10 MG/ML IJ SOLN
10.0000 mg | Freq: Once | INTRAMUSCULAR | Status: AC
  Administered 2023-10-12: 10 mg via INTRAVENOUS
  Filled 2023-10-12: qty 1

## 2023-10-12 MED ORDER — PREDNISONE 10 MG PO TABS: 20.0000 mg | ORAL_TABLET | Freq: Every day | ORAL | 0 refills | Status: AC

## 2023-10-12 MED ORDER — IOHEXOL 350 MG/ML SOLN
75.0000 mL | Freq: Once | INTRAVENOUS | Status: AC | PRN
  Administered 2023-10-12: 75 mL via INTRAVENOUS

## 2023-10-12 MED ORDER — ACETAMINOPHEN 325 MG PO TABS
650.0000 mg | ORAL_TABLET | Freq: Once | ORAL | Status: AC
Start: 2023-10-12 — End: 2023-10-12
  Administered 2023-10-12: 650 mg via ORAL
  Filled 2023-10-12: qty 2

## 2023-10-12 MED ORDER — LACTATED RINGERS IV BOLUS (SEPSIS)
1000.0000 mL | Freq: Once | INTRAVENOUS | Status: AC
  Administered 2023-10-12: 1000 mL via INTRAVENOUS

## 2023-10-12 MED ORDER — IPRATROPIUM-ALBUTEROL 0.5-2.5 (3) MG/3ML IN SOLN
3.0000 mL | Freq: Once | RESPIRATORY_TRACT | Status: AC
Start: 2023-10-12 — End: 2023-10-12
  Administered 2023-10-12: 3 mL via RESPIRATORY_TRACT
  Filled 2023-10-12: qty 3

## 2023-10-12 NOTE — ED Provider Notes (Signed)
[facility] AT [facility] Provider Note   CSN: [id number] Arrival date & time: 10/12/23  1618     History  Chief Complaint  Patient presents with   Altered Mental Status    Matthew Roman is a 68 y.o. male.   Altered Mental Status Patient presents for altered mental status.  Medical history includes CVA, HTN, cirrhosis, DM, COPD, dementia, GERD, colon cancer, OSA, HLD.  History is provided primarily by his granddaughter, who he lives with.  Patient seem to be in his normal state of health yesterday.  He states that he was "bit out of it" last night.  Today, he has not been speaking.  He seems confused and lethargic.  At baseline, he ambulates without difficulty, including stairs in his home.  He is typically aware of his situation and time.  Today, he does not seem to recognize his granddaughter.  This is a departure from his baseline.  He has not had any recent infectious symptoms.  Granddaughter reports that he has been eating normally.  He has had a cough but this is chronic for him.  He has COPD and uses an albuterol inhaler at home as needed.  He has not been complaining of any new areas of pain.  He does have chronic back pain.  Granddaughter manages his medication and states that he has been getting all of his medications as prescribed.     Home Medications Prior to Admission medications   Medication Sig Start Date End Date Taking? Authorizing Provider  albuterol (VENTOLIN HFA) 108 (90 Base) MCG/ACT inhaler Inhale 2 puffs into the lungs 4 (four) times daily as needed for wheezing or shortness of breath.   Yes [provider]  amLODipine (NORVASC) 5 MG tablet Take 1 tablet (5 mg total) by mouth daily. 03/09/22 10/12/23 Yes Mathews Argyle, NP  aspirin 81 MG chewable tablet Chew 81 mg by mouth daily.   Yes [provider]  atorvastatin (LIPITOR) 40 MG tablet Take 1 tablet (40 mg total) by mouth daily at 6 PM. 08/05/22  Yes Champ Mungo,  DO  cholecalciferol (VITAMIN D) 25 MCG tablet Take 2 tablets (2,000 Units total) by mouth daily. Patient taking differently: Take 1,000 Units by mouth daily. 03/10/22  Yes Mathews Argyle, NP  clopidogrel (PLAVIX) 75 MG tablet Take 1 tablet (75 mg total) by mouth daily. 08/05/22  Yes Champ Mungo, DO  lactulose (CHRONULAC) 10 GM/15ML solution Take 7.5 mLs (5 g total) by mouth 2 (two) times daily. Patient taking differently: Take 20 g by mouth 2 (two) times daily as needed for mild constipation or moderate constipation. 08/05/22  Yes Champ Mungo, DO  metFORMIN (GLUCOPHAGE-XR) 500 MG 24 hr tablet Take 500 mg by mouth 2 (two) times daily with a meal.   Yes [provider]  Oxycodone HCl 10 MG TABS Take 10 mg by mouth See admin instructions. Take 10 mg by mouth at bedtime and an additional 10 mg once a day as needed for pain   Yes [provider]  predniSONE (DELTASONE) 10 MG tablet Take 2 tablets (20 mg total) by mouth daily for 5 days. 10/12/23 10/17/23 Yes Gloris Manchester, MD  vitamin B-12 (CYANOCOBALAMIN) 500 MCG tablet Take 1,000 mcg by mouth daily. 03/19/21  Yes [provider]  albuterol (PROVENTIL) (2.5 MG/3ML) 0.083% nebulizer solution Take 3 mLs (2.5 mg total) by nebulization every 6 (six) hours as needed for wheezing or shortness of breath. Patient not taking: Reported on  10/12/2023 03/09/22   Mathews Argyle, NP      Allergies    Patient has no known allergies.    Review of Systems   Review of Systems  Unable to perform ROS: Mental status change    Physical Exam Updated Vital Signs BP (!) 132/102   Pulse (!) 124   Temp (!) 100.8 F (38.2 C) (Oral)   Resp 20   SpO2 94%  Physical Exam Vitals and nursing note reviewed.  Constitutional:      General: He is not in acute distress.    Appearance: Normal appearance. He is well-developed. He is ill-appearing. He is not toxic-appearing or diaphoretic.  HENT:     Head: Normocephalic and atraumatic.     Right Ear:  External ear normal.     Left Ear: External ear normal.     Nose: Nose normal.     Mouth/Throat:     Mouth: Mucous membranes are moist.  Eyes:     Extraocular Movements: Extraocular movements intact.     Conjunctiva/sclera: Conjunctivae normal.  Cardiovascular:     Rate and Rhythm: Regular rhythm. Tachycardia present.     Heart sounds: No murmur heard. Pulmonary:     Effort: Pulmonary effort is normal. Tachypnea present. No respiratory distress.     Breath sounds: Decreased breath sounds present. No wheezing, rhonchi or rales.  Abdominal:     General: There is no distension.     Palpations: Abdomen is soft.     Tenderness: There is abdominal tenderness. There is no guarding or rebound.  Musculoskeletal:        General: No swelling or deformity.     Cervical back: Normal range of motion and neck supple.     Right lower leg: No edema.     Left lower leg: No edema.  Skin:    General: Skin is warm and dry.     Capillary Refill: Capillary refill takes less than 2 seconds.     Coloration: Skin is not jaundiced or pale.  Neurological:     Mental Status: He is alert. He is disoriented.     Comments: Patient appears to have global weakness.  He is able to move all extremities.  Neuroexam is limited by patient's ability to follow commands.  He is able to answer simple questions.  Psychiatric:        Mood and Affect: Mood normal.     ED Results / Procedures / Treatments   Labs (all labs ordered are listed, but only abnormal results are displayed) Labs Reviewed  RESP PANEL BY RT-PCR (RSV, FLU A&B, COVID)  RVPGX2 - Abnormal; Notable for the following components:      Result Value   SARS Coronavirus 2 by RT PCR POSITIVE (*)    All other components within normal limits  COMPREHENSIVE METABOLIC PANEL - Abnormal; Notable for the following components:   CO2 19 (*)    Glucose, Bld 144 (*)    All other components within normal limits  MAGNESIUM - Abnormal; Notable for the following  components:   Magnesium 1.6 (*)    All other components within normal limits  CBC WITH DIFFERENTIAL/PLATELET - Abnormal; Notable for the following components:   Platelets 107 (*)    Lymphs Abs 0.3 (*)    All other components within normal limits  CBG MONITORING, ED - Abnormal; Notable for the following components:   Glucose-Capillary 119 (*)    All other components within normal limits  I-STAT CHEM 8,  ED - Abnormal; Notable for the following components:   Glucose, Bld 144 (*)    Calcium, Ion 1.14 (*)    All other components within normal limits  I-STAT VENOUS BLOOD GAS, ED - Abnormal; Notable for the following components:   pH, Ven 7.432 (*)    pCO2, Ven 38.8 (*)    pO2, Ven 55 (*)    Calcium, Ion 1.14 (*)    All other components within normal limits  CULTURE, BLOOD (ROUTINE X 2)  CULTURE, BLOOD (ROUTINE X 2)  BRAIN NATRIURETIC PEPTIDE  AMMONIA  PROTIME-INR  URINALYSIS, W/ REFLEX TO CULTURE (INFECTION SUSPECTED)  CBC WITH DIFFERENTIAL/PLATELET  I-STAT CG4 LACTIC ACID, ED  I-STAT CG4 LACTIC ACID, ED  TROPONIN I (HIGH SENSITIVITY)  TROPONIN I (HIGH SENSITIVITY)    EKG EKG Interpretation Date/Time:  Monday October 12 2023 16:44:46 EST Ventricular Rate:  126 PR Interval:  112 QRS Duration:  76 QT Interval:  286 QTC Calculation: 414 R Axis:   39  Text Interpretation: Sinus tachycardia Confirmed by Gloris Manchester ([id number]) on 10/12/2023 5:35:16 PM  Radiology CT Angio Chest PE W and/or Wo Contrast Result Date: 10/12/2023 CLINICAL DATA:  Confusion, acute abdominal pain EXAM: CT ANGIOGRAPHY CHEST CT ABDOMEN AND PELVIS WITH CONTRAST TECHNIQUE: Multidetector CT imaging of the chest was performed using the standard protocol during bolus administration of intravenous contrast. Multiplanar CT image reconstructions and MIPs were obtained to evaluate the vascular anatomy. Multidetector CT imaging of the abdomen and pelvis was performed using the standard protocol during bolus administration of  intravenous contrast. RADIATION DOSE REDUCTION: This exam was performed according to the departmental dose-optimization program which includes automated exposure control, adjustment of the mA and/or kV according to patient size and/or use of iterative reconstruction technique. CONTRAST:  75mL OMNIPAQUE IOHEXOL 350 MG/ML SOLN COMPARISON:  Chest radiograph 10/12/2023 and CT abdomen 09/02/2018 FINDINGS: CTA CHEST FINDINGS Cardiovascular: No filling defect is identified in the pulmonary arterial tree to suggest pulmonary embolus. Coronary, aortic arch, and branch vessel atherosclerotic vascular disease. Interventricular septal thickness 1.9 cm, favoring left ventricular hypertrophy. Mediastinum/Nodes: Borderline enlarged right hilar lymph node 1.0 cm in short axis, image 87 series 6. Right infrahilar node 0.9 cm in short axis on image 105 series 6. Lungs/Pleura: Centrilobular emphysema. Airway thickening is present, suggesting bronchitis or reactive airways disease. Airway plugging in the right lower lobe. 6 by 5 by 4 mm right lower lobe nodule on image 127 of series 7. 4 mm left lower lobe nodule on image 120 series 7. A couple of additional tiny nodules are present. Musculoskeletal: Thoracic spondylosis. Review of the MIP images confirms the above findings. CT ABDOMEN and PELVIS FINDINGS Hepatobiliary: Hepatic cirrhosis. Clustered cholecystectomy clips noted. No biliary dilatation. No discrete hepatic mass identified. Pancreas: Unremarkable Spleen: Mild splenomegaly. Adrenals/Urinary Tract: Simple 1.9 cm left kidney lower pole cyst, image 38 series 3. No further imaging workup of this lesion is indicated. Vascular calcifications noted along the renal hila. The kidneys and adrenal glands appear otherwise unremarkable. Stomach/Bowel: Unremarkable Vascular/Lymphatic: Atherosclerosis is present, including aortoiliac atherosclerotic disease. Vascular variant observed with the SMA supplying the splenic artery. The proper  hepatic artery appears completely supplied by the celiac trunk. Fusiform infrarenal abdominal aortic aneurysm 3.2 cm in diameter on image 40 series 3. Reproductive: Mild prostatomegaly. Other: No supplemental non-categorized findings. Musculoskeletal: Small umbilical hernia contains adipose tissue. Posterolateral rod and pedicle screw fixation at L5-S1. Cannulated screws in the right proximal femur. Degenerative arthropathy of both hips. Review of the MIP  images confirms the above findings. IMPRESSION: 1. No filling defect is identified in the pulmonary arterial tree to suggest pulmonary embolus. 2. Airway thickening is present, suggesting bronchitis or reactive airways disease. Airway plugging in the right lower lobe. 3. Borderline enlarged right hilar lymph nodes, nonspecific. 4. Hepatic cirrhosis. 5. Mild splenomegaly. 6. Fusiform infrarenal abdominal aortic aneurysm 3.2 cm in diameter. Recommend follow-up ultrasound every 3 years. This recommendation follows ACR consensus guidelines: White Paper of the ACR Incidental Findings Committee II on Vascular Findings. J Am Coll Radiol 2013; 10:789-794. 7. Mild prostatomegaly. 8. Small umbilical hernia contains adipose tissue. 9. A few small pulmonary nodules averaging up to 5 mm in diameter. No follow-up needed if patient is low-risk (and has no known or suspected primary neoplasm). Non-contrast chest CT can be considered in 12 months if patient is high-risk. This recommendation follows the consensus statement: Guidelines for Management of Incidental Pulmonary Nodules Detected on CT Images: From the Fleischner Society 2017; Radiology 2017; 284:228-243. Aortic Atherosclerosis (ICD10-I70.0) and Emphysema (ICD10-J43.9). Electronically Signed   By: Gaylyn Rong M.D.   On: 10/12/2023 19:58   CT ABDOMEN PELVIS W CONTRAST Result Date: 10/12/2023 CLINICAL DATA:  Confusion, acute abdominal pain EXAM: CT ANGIOGRAPHY CHEST CT ABDOMEN AND PELVIS WITH CONTRAST TECHNIQUE:  Multidetector CT imaging of the chest was performed using the standard protocol during bolus administration of intravenous contrast. Multiplanar CT image reconstructions and MIPs were obtained to evaluate the vascular anatomy. Multidetector CT imaging of the abdomen and pelvis was performed using the standard protocol during bolus administration of intravenous contrast. RADIATION DOSE REDUCTION: This exam was performed according to the departmental dose-optimization program which includes automated exposure control, adjustment of the mA and/or kV according to patient size and/or use of iterative reconstruction technique. CONTRAST:  75mL OMNIPAQUE IOHEXOL 350 MG/ML SOLN COMPARISON:  Chest radiograph 10/12/2023 and CT abdomen 09/02/2018 FINDINGS: CTA CHEST FINDINGS Cardiovascular: No filling defect is identified in the pulmonary arterial tree to suggest pulmonary embolus. Coronary, aortic arch, and branch vessel atherosclerotic vascular disease. Interventricular septal thickness 1.9 cm, favoring left ventricular hypertrophy. Mediastinum/Nodes: Borderline enlarged right hilar lymph node 1.0 cm in short axis, image 87 series 6. Right infrahilar node 0.9 cm in short axis on image 105 series 6. Lungs/Pleura: Centrilobular emphysema. Airway thickening is present, suggesting bronchitis or reactive airways disease. Airway plugging in the right lower lobe. 6 by 5 by 4 mm right lower lobe nodule on image 127 of series 7. 4 mm left lower lobe nodule on image 120 series 7. A couple of additional tiny nodules are present. Musculoskeletal: Thoracic spondylosis. Review of the MIP images confirms the above findings. CT ABDOMEN and PELVIS FINDINGS Hepatobiliary: Hepatic cirrhosis. Clustered cholecystectomy clips noted. No biliary dilatation. No discrete hepatic mass identified. Pancreas: Unremarkable Spleen: Mild splenomegaly. Adrenals/Urinary Tract: Simple 1.9 cm left kidney lower pole cyst, image 38 series 3. No further imaging  workup of this lesion is indicated. Vascular calcifications noted along the renal hila. The kidneys and adrenal glands appear otherwise unremarkable. Stomach/Bowel: Unremarkable Vascular/Lymphatic: Atherosclerosis is present, including aortoiliac atherosclerotic disease. Vascular variant observed with the SMA supplying the splenic artery. The proper hepatic artery appears completely supplied by the celiac trunk. Fusiform infrarenal abdominal aortic aneurysm 3.2 cm in diameter on image 40 series 3. Reproductive: Mild prostatomegaly. Other: No supplemental non-categorized findings. Musculoskeletal: Small umbilical hernia contains adipose tissue. Posterolateral rod and pedicle screw fixation at L5-S1. Cannulated screws in the right proximal femur. Degenerative arthropathy of both hips. Review of the  MIP images confirms the above findings. IMPRESSION: 1. No filling defect is identified in the pulmonary arterial tree to suggest pulmonary embolus. 2. Airway thickening is present, suggesting bronchitis or reactive airways disease. Airway plugging in the right lower lobe. 3. Borderline enlarged right hilar lymph nodes, nonspecific. 4. Hepatic cirrhosis. 5. Mild splenomegaly. 6. Fusiform infrarenal abdominal aortic aneurysm 3.2 cm in diameter. Recommend follow-up ultrasound every 3 years. This recommendation follows ACR consensus guidelines: White Paper of the ACR Incidental Findings Committee II on Vascular Findings. J Am Coll Radiol 2013; 10:789-794. 7. Mild prostatomegaly. 8. Small umbilical hernia contains adipose tissue. 9. A few small pulmonary nodules averaging up to 5 mm in diameter. No follow-up needed if patient is low-risk (and has no known or suspected primary neoplasm). Non-contrast chest CT can be considered in 12 months if patient is high-risk. This recommendation follows the consensus statement: Guidelines for Management of Incidental Pulmonary Nodules Detected on CT Images: From the Fleischner Society 2017;  Radiology 2017; 284:228-243. Aortic Atherosclerosis (ICD10-I70.0) and Emphysema (ICD10-J43.9). Electronically Signed   By: Gaylyn Rong M.D.   On: 10/12/2023 19:58   CT Head Wo Contrast Result Date: 10/12/2023 CLINICAL DATA:  Altered mental status EXAM: CT HEAD WITHOUT CONTRAST TECHNIQUE: Contiguous axial images were obtained from the base of the skull through the vertex without intravenous contrast. RADIATION DOSE REDUCTION: This exam was performed according to the departmental dose-optimization program which includes automated exposure control, adjustment of the mA and/or kV according to patient size and/or use of iterative reconstruction technique. COMPARISON:  08/04/2022 FINDINGS: Brain: Normal anatomic configuration of the brain. Moderate to severe periventricular patchy white matter changes are present likely reflecting the sequela of small vessel ischemia. Remote lacunar infarcts are noted within the basal ganglia bilaterally, left thalamus and left corona radiata. These appears stable since prior examination. No acute intracranial hemorrhage or infarct. No abnormal mass effect or midline shift. No abnormal intra or extra-axial mass lesion or fluid collection. Ventricular size is normal. Cerebellum is unremarkable. Vascular: No hyperdense vessel or unexpected calcification. Skull: Normal. Negative for fracture or focal lesion. Sinuses/Orbits: No acute finding. Other: Mastoid air cells and middle ear cavities are clear. IMPRESSION: 1. No acute intracranial abnormality. No calvarial fracture. 2. Moderate to severe senescent change. 3. Remote lacunar infarcts as described above. Electronically Signed   By: Helyn Numbers M.D.   On: 10/12/2023 19:55   DG Chest Port 1 View Result Date: 10/12/2023 CLINICAL DATA:  Sepsis, altered level of consciousness EXAM: PORTABLE CHEST 1 VIEW COMPARISON:  03/08/2022 FINDINGS: Single frontal view of the chest demonstrate an unremarkable cardiac silhouette. No acute  airspace disease, effusion, or pneumothorax. No acute bony abnormalities. IMPRESSION: 1. No acute intrathoracic process. Electronically Signed   By: Sharlet Salina M.D.   On: 10/12/2023 17:36    Procedures Procedures    Medications Ordered in ED Medications  magnesium sulfate IVPB 2 g 50 mL (has no administration in time range)  dexamethasone (DECADRON) injection 10 mg (has no administration in time range)  lactated ringers bolus 1,000 mL (1,000 mLs Intravenous New Bag/Given 10/12/23 1754)  acetaminophen (TYLENOL) tablet 650 mg (650 mg Oral Given 10/12/23 1754)  ipratropium-albuterol (DUONEB) 0.5-2.5 (3) MG/3ML nebulizer solution 3 mL (3 mLs Nebulization Given 10/12/23 1755)  iohexol (OMNIPAQUE) 350 MG/ML injection 75 mL (75 mLs Intravenous Contrast Given 10/12/23 1928)    ED Course/ Medical Decision Making/ A&P  Medical Decision Making Amount and/or Complexity of Data Reviewed Labs: ordered. Radiology: ordered.  Risk OTC drugs. Prescription drug management.   This patient presents to the ED for concern of altered mental status, this involves an extensive number of treatment options, and is a complaint that carries with it a high risk of complications and morbidity.  The differential diagnosis includes CVA, TIA, ICH, polypharmacy, hepatic encephalopathy, seizures, infection, metabolic derangements   Co morbidities that complicate the patient evaluation  CVA, HTN, cirrhosis, DM, COPD, dementia, GERD, colon cancer, OSA, HLD   Additional history obtained:  Additional history obtained from patient's granddaughter External records from outside source obtained and reviewed including EMR   Lab Tests:  I Ordered, and personally interpreted labs.  The pertinent results include: No acidosis on blood gas, normal kidney function, normal electrolytes, no leukocytosis, normal lactate.  Patient tested positive for COVID-19   Imaging Studies ordered:  I  ordered imaging studies including chest x-ray, CT head, CTA chest, CT of abdomen and pelvis I independently visualized and interpreted imaging which showed no acute findings I agree with the radiologist interpretation   Cardiac Monitoring: / EKG:  The patient was maintained on a cardiac monitor.  I personally viewed and interpreted the cardiac monitored which showed an underlying rhythm of: Sinus rhythm  Problem List / ED Course / Critical interventions / Medication management  Patient presenting for altered mental status.  Last known well was yesterday evening.  History is provided by his granddaughter, who he lives with.  On arrival in the ED, vital signs are notable for tachycardia, tachypnea, low-grade fever, and hypertension.  On exam, patient is resting with his eyes closed.  He will open his eyes to voice.  He will answer basic questions.  He has a difficult time following commands.  It does not appear that he has any focal neurologic deficits.  He does have some abdominal tenderness to deep palpation.  He does have diminished breath sounds on lung auscultation.  Tylenol was ordered for antipyresis.  IV fluids were ordered for hydration.  Will give DuoNeb for underlying COPD.  Workup was initiated.  Patient tested positive for COVID-19.  Workup was otherwise unremarkable.  While in the ED, after fluids and Tylenol, he had full return to mental baseline.  Patient and granddaughter were advised to continue supportive care at home.  He was given dose of Decadron, given his underlying COPD.  Patient was discharged in stable condition. I ordered medication including Decadron and DuoNeb for COPD; IV fluids for hydration; Tylenol for antipyresis Reevaluation of the patient after these medicines showed that the patient improved I have reviewed the patients home medicines and have made adjustments as needed   Social Determinants of Health:  Lives at home with granddaughter, has dementia but remains  functional        Final Clinical Impression(s) / ED Diagnoses Final diagnoses:  COVID-19    Rx / DC Orders ED Discharge Orders          Ordered    predniSONE (DELTASONE) 10 MG tablet  Daily        10/12/23 2013              Gloris Manchester, MD 10/12/23 2014

## 2023-10-12 NOTE — ED Notes (Signed)
Patient to CT.

## 2023-10-12 NOTE — Discharge Instructions (Addendum)
You have COVID.  Take ibuprofen and Tylenol as needed for fevers and soreness.  Ensure that you stay hydrated.  A prescription for 5 days of steroids was sent to your pharmacy.  Use albuterol as needed for shortness of breath and cough.  Follow-up with PCP.  Return to the emergency department for any new or worsening symptoms of concern.  For your awareness, the following are incidental findings seen on your CT scan: -Airway thickening is present, suggesting bronchitis or reactive airways disease. Airway plugging in the right lower lobe. -Borderline enlarged right hilar lymph nodes, nonspecific. -Hepatic cirrhosis. -Mild splenomegaly. -Fusiform infrarenal abdominal aortic aneurysm 3.2 cm in diameter. Recommend follow-up ultrasound every 3 years.  -Mild prostatomegaly. -Small umbilical hernia contains adipose tissue. -A few small pulmonary nodules averaging up to 5 mm in diameter.

## 2023-10-12 NOTE — ED Triage Notes (Signed)
Pt is brought in by primary caregiver (Granddaughter), she mentions today that he seems more confused than her normal. He is demented and usually can identify her, the year and etc, but he is not verbalizing anything in triage. He does have a runny nose as well in triage with no reported new fevers or cough.

## 2023-10-17 LAB — CULTURE, BLOOD (ROUTINE X 2)
Culture: NO GROWTH
Culture: NO GROWTH
Special Requests: ADEQUATE

## 2024-06-06 IMAGING — DX DG CHEST 1V PORT
2 series · 2 of 2 positions shown · non-contrast
Comparison: 07/27/2020 and prior studies

CLINICAL DATA: Altered mental status

EXAM:
PORTABLE CHEST 1 VIEW

[chest ap (1 of 2)]
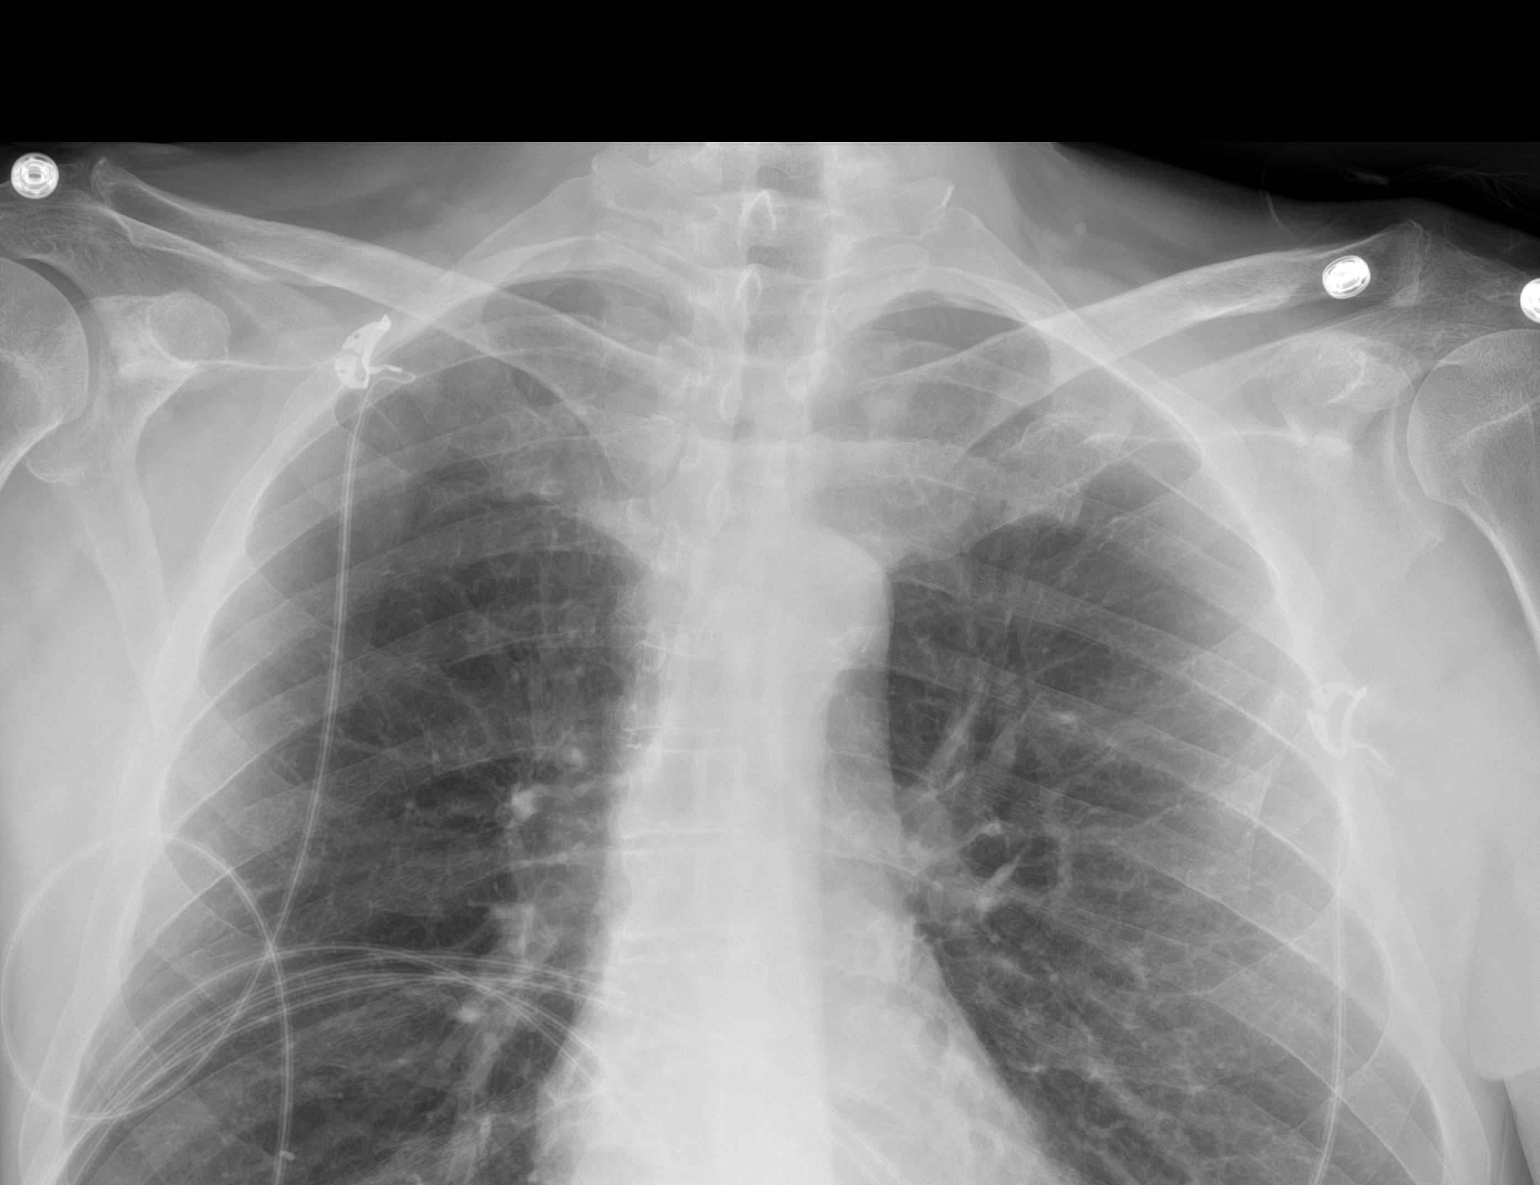

[chest ap (2 of 2)]
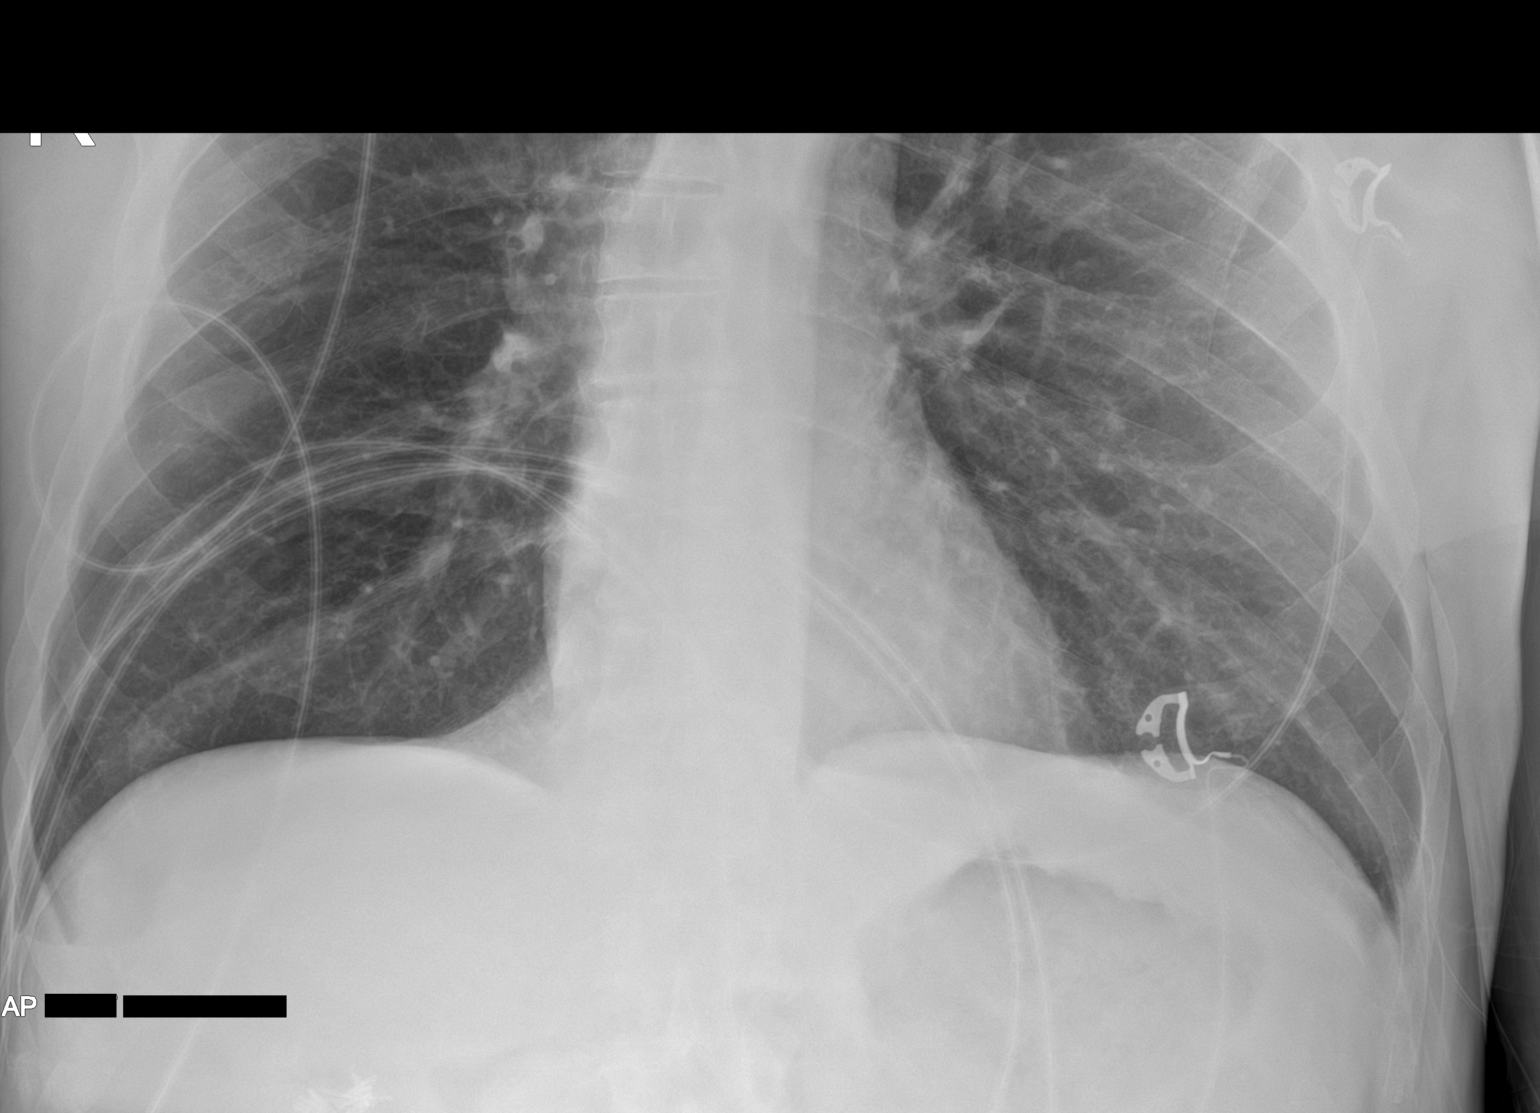

[2 of 2 positions shown; findings below may reference images not displayed]

FINDINGS: There is no evidence of focal airspace disease, pulmonary edema,
suspicious pulmonary nodule/mass, pleural effusion, or pneumothorax.

No acute bony abnormalities are identified.
IMPRESSION: No active disease.

## 2024-09-02 ENCOUNTER — Emergency Department (HOSPITAL_COMMUNITY)

## 2024-09-02 ENCOUNTER — Observation Stay (HOSPITAL_COMMUNITY)

## 2024-09-02 ENCOUNTER — Inpatient Hospital Stay (HOSPITAL_COMMUNITY)
Admission: EM | Admit: 2024-09-02 | Source: Home / Self Care | Attending: Internal Medicine | Admitting: Internal Medicine

## 2024-09-02 DIAGNOSIS — I639 Cerebral infarction, unspecified: Principal | ICD-10-CM

## 2024-09-02 DIAGNOSIS — E440 Moderate protein-calorie malnutrition: Secondary | ICD-10-CM | POA: Insufficient documentation

## 2024-09-02 DIAGNOSIS — R4182 Altered mental status, unspecified: Secondary | ICD-10-CM | POA: Diagnosis present

## 2024-09-02 DIAGNOSIS — R410 Disorientation, unspecified: Secondary | ICD-10-CM

## 2024-09-02 LAB — I-STAT CHEM 8, ED
BUN: 5 mg/dL — ABNORMAL LOW (ref 8–23)
Calcium, Ion: 1.11 mmol/L — ABNORMAL LOW (ref 1.15–1.40)
Chloride: 101 mmol/L (ref 98–111)
Creatinine, Ser: 0.9 mg/dL (ref 0.61–1.24)
Glucose, Bld: 131 mg/dL — ABNORMAL HIGH (ref 70–99)
HCT: 40 % (ref 39.0–52.0)
Hemoglobin: 13.6 g/dL (ref 13.0–17.0)
Potassium: 4 mmol/L (ref 3.5–5.1)
Sodium: 141 mmol/L (ref 135–145)
TCO2: 26 mmol/L (ref 22–32)

## 2024-09-02 LAB — COMPREHENSIVE METABOLIC PANEL WITH GFR
ALT: 34 U/L (ref 0–44)
AST: 24 U/L (ref 15–41)
Albumin: 3.6 g/dL (ref 3.5–5.0)
Alkaline Phosphatase: 96 U/L (ref 38–126)
Anion gap: 9 (ref 5–15)
BUN: 5 mg/dL — ABNORMAL LOW (ref 8–23)
CO2: 24 mmol/L (ref 22–32)
Calcium: 8.6 mg/dL — ABNORMAL LOW (ref 8.9–10.3)
Chloride: 105 mmol/L (ref 98–111)
Creatinine, Ser: 0.74 mg/dL (ref 0.61–1.24)
GFR, Estimated: >60 mL/min (ref >60)
Glucose, Bld: 134 mg/dL — ABNORMAL HIGH (ref 70–99)
Potassium: 4 mmol/L (ref 3.5–5.1)
Sodium: 138 mmol/L (ref 135–145)
Total Bilirubin: 0.5 mg/dL (ref 0.0–1.2)
Total Protein: 5.9 g/dL — ABNORMAL LOW (ref 6.5–8.1)

## 2024-09-02 LAB — T4, FREE: Free T4: 0.81 ng/dL (ref 0.61–1.12)

## 2024-09-02 LAB — PROCALCITONIN: Procalcitonin: <0.1 ng/mL

## 2024-09-02 LAB — DIFFERENTIAL
Abs Immature Granulocytes: 0.01 K/uL (ref 0.00–0.07)
Basophils Absolute: 0.1 K/uL (ref 0.0–0.1)
Basophils Relative: 1 %
Eosinophils Absolute: 0.1 K/uL (ref 0.0–0.5)
Eosinophils Relative: 2 %
Immature Granulocytes: 0 %
Lymphocytes Relative: 16 %
Lymphs Abs: 0.8 K/uL (ref 0.7–4.0)
Monocytes Absolute: 0.4 K/uL (ref 0.1–1.0)
Monocytes Relative: 8 %
Neutro Abs: 3.3 K/uL (ref 1.7–7.7)
Neutrophils Relative %: 73 %

## 2024-09-02 LAB — APTT: aPTT: 29 s (ref 24–36)

## 2024-09-02 LAB — MAGNESIUM: Magnesium: 1.6 mg/dL — ABNORMAL LOW (ref 1.7–2.4)

## 2024-09-02 LAB — D-DIMER, QUANTITATIVE: D-Dimer, Quant: 0.92 ug{FEU}/mL — ABNORMAL HIGH (ref 0.00–0.50)

## 2024-09-02 LAB — CBG MONITORING, ED
Glucose-Capillary: 90 mg/dL (ref 70–99)
Glucose-Capillary: 87 mg/dL (ref 70–99)

## 2024-09-02 LAB — CBC
HCT: 42 % (ref 39.0–52.0)
Hemoglobin: 13.3 g/dL (ref 13.0–17.0)
MCH: 28.8 pg (ref 26.0–34.0)
MCHC: 31.7 g/dL (ref 30.0–36.0)
MCV: 90.9 fL (ref 80.0–100.0)
Platelets: 95 K/uL — ABNORMAL LOW (ref 150–400)
RBC: 4.62 MIL/uL (ref 4.22–5.81)
RDW: 14 % (ref 11.5–15.5)
WBC: 4.6 K/uL (ref 4.0–10.5)
nRBC: 0 % (ref 0.0–0.2)

## 2024-09-02 LAB — C-REACTIVE PROTEIN: CRP: <0.5 mg/dL (ref <1.0)

## 2024-09-02 LAB — AMMONIA: Ammonia: 26 umol/L (ref 9–35)

## 2024-09-02 LAB — PROTIME-INR
INR: 1.1 (ref 0.8–1.2)
Prothrombin Time: 14.8 s (ref 11.4–15.2)

## 2024-09-02 LAB — HEMOGLOBIN A1C
Hgb A1c MFr Bld: 5.5 % (ref 4.8–5.6)
Mean Plasma Glucose: 111.15 mg/dL

## 2024-09-02 LAB — HIV ANTIBODY (ROUTINE TESTING W REFLEX): HIV Screen 4th Generation wRfx: NONREACTIVE

## 2024-09-02 LAB — ETHANOL: Alcohol, Ethyl (B): <15 mg/dL (ref <15)

## 2024-09-02 LAB — TROPONIN I (HIGH SENSITIVITY): Troponin I (High Sensitivity): 6 ng/L (ref <18)

## 2024-09-02 LAB — VITAMIN B12: Vitamin B-12: 1221 pg/mL — ABNORMAL HIGH (ref 180–914)

## 2024-09-02 LAB — TSH: TSH: 1.004 u[IU]/mL (ref 0.350–4.500)

## 2024-09-02 MED ORDER — SODIUM CHLORIDE 0.9% FLUSH: 3.0000 mL | Freq: Once | INTRAVENOUS | Status: AC

## 2024-09-02 MED ORDER — ACETAMINOPHEN 325 MG PO TABS
650.0000 mg | ORAL_TABLET | ORAL | Status: AC | PRN
  Administered 2024-09-03 – 2024-10-22 (×8): 650 mg via ORAL
  Filled 2024-09-02 (×7): qty 2

## 2024-09-02 MED ORDER — FAMOTIDINE IN NACL 20-0.9 MG/50ML-% IV SOLN
20.0000 mg | Freq: Two times a day (BID) | INTRAVENOUS | Status: DC
  Administered 2024-09-02 – 2024-09-03 (×2): 20 mg via INTRAVENOUS
  Filled 2024-09-02 (×3): qty 50

## 2024-09-02 MED ORDER — HEPARIN SODIUM (PORCINE) 5000 UNIT/ML IJ SOLN
5000.0000 [IU] | Freq: Three times a day (TID) | INTRAMUSCULAR | Status: DC
  Administered 2024-09-03 – 2024-09-26 (×65): 5000 [IU] via SUBCUTANEOUS
  Filled 2024-09-02 (×50): qty 1

## 2024-09-02 MED ORDER — INSULIN ASPART 100 UNIT/ML IJ SOLN
0.0000 [IU] | INTRAMUSCULAR | Status: DC
  Administered 2024-09-03 – 2024-09-04 (×2): 1 [IU] via SUBCUTANEOUS
  Filled 2024-09-02 (×2): qty 1

## 2024-09-02 MED ORDER — ACETAMINOPHEN 650 MG RE SUPP: 650.0000 mg | RECTAL | Status: AC | PRN

## 2024-09-02 MED ORDER — ALBUTEROL SULFATE (2.5 MG/3ML) 0.083% IN NEBU: 3.0000 mL | INHALATION_SOLUTION | Freq: Four times a day (QID) | RESPIRATORY_TRACT | Status: AC | PRN

## 2024-09-02 MED ORDER — MAGNESIUM SULFATE IN D5W 1-5 GM/100ML-% IV SOLN
1.0000 g | Freq: Once | INTRAVENOUS | Status: AC
  Administered 2024-09-02: 1 g via INTRAVENOUS
  Filled 2024-09-02: qty 100

## 2024-09-02 MED ORDER — IOHEXOL 350 MG/ML SOLN
75.0000 mL | Freq: Once | INTRAVENOUS | Status: AC | PRN
  Administered 2024-09-02: 75 mL via INTRAVENOUS

## 2024-09-02 MED ORDER — SODIUM CHLORIDE 0.9 % IV SOLN: INTRAVENOUS | Status: AC

## 2024-09-02 MED ORDER — STROKE: EARLY STAGES OF RECOVERY BOOK: Freq: Once | Status: AC

## 2024-09-02 MED ORDER — ASPIRIN 81 MG PO CHEW
81.0000 mg | CHEWABLE_TABLET | Freq: Every day | ORAL | Status: AC
  Administered 2024-09-03 – 2024-09-23 (×21): 81 mg via ORAL
  Filled 2024-09-02 (×16): qty 1

## 2024-09-02 MED ORDER — ATORVASTATIN CALCIUM 40 MG PO TABS
40.0000 mg | ORAL_TABLET | Freq: Every day | ORAL | Status: AC
  Administered 2024-09-03 – 2024-10-28 (×55): 40 mg via ORAL
  Filled 2024-09-02 (×47): qty 1

## 2024-09-02 MED ORDER — ACETAMINOPHEN 160 MG/5ML PO SOLN: 650.0000 mg | ORAL | Status: AC | PRN

## 2024-09-02 MED ORDER — CLOPIDOGREL BISULFATE 75 MG PO TABS
75.0000 mg | ORAL_TABLET | Freq: Every day | ORAL | Status: AC
  Administered 2024-09-03 – 2024-10-28 (×56): 75 mg via ORAL
  Filled 2024-09-02 (×51): qty 1

## 2024-09-02 NOTE — TOC CM/SW Note (Signed)
 TOC consult received for d/c planning needs. Per notes, patient resides with his caregiver, Grayce Kurk [phone]). Follow-up to be completed with patient as appropriate.   Merilee Batty, MSN, RN Case Management [phone]

## 2024-09-02 NOTE — ED Provider Notes (Signed)
 I assessed patient's airway as he was rolling into the urgency department.  Airway is intact and patient was able to take deep breaths.  Patient is cleared to go to the CT scanner.   Maryln Eastham, Lonni PARAS, MD 09/02/24 [phone]

## 2024-09-02 NOTE — ED Triage Notes (Signed)
 Pt BIB [facility] from home for code stroke. Pt LKW 1100, caregiver noticed at 1115 pt had trouble putting his coat on and became confused. Per EMS L hand neglect and expressive aphasia. Pt has hx TIAs. VSS per EMS.

## 2024-09-02 NOTE — ED Notes (Signed)
 When you have time, Grayce Kurk (caregiver) [phone] would like update on pt

## 2024-09-02 NOTE — H&P (Addendum)
 History and Physical    Patient: Matthew Roman FMW:[id number] DOB: Jul 12, 1956 DOA: 09/02/2024 DOS: the patient was seen and examined on 09/02/2024 . PCP: [facility]  Patient coming from: Home Chief complaint: Chief Complaint  Patient presents with   Code Stroke   HPI:  Matthew Roman is a 68 y.o. 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 130 in the treatment patient will be standing confused.  ED Course:  Vital signs in the ED were notable for the following:  Vitals:   09/02/24 1938 09/02/24 1939 09/02/24 1940 09/02/24 1941  BP:      Pulse: 93 79 77 88  Temp:   98.2 F (36.8 C)   Resp:      Weight:      SpO2: 98% 95% 97% 97%  TempSrc:   Oral    >>ED evaluation thus far shows:  -CMP shows glucose 134 and otherwise normal.  - CBC shows platelet of 95 and no overt bleeding.  - NH4 of 26.  - EKG, chest x-ray ordered and pending. -D-dimer, troponin, procalcitonin ordered and pending.  >>While in the ED patient received the following: Medications  sodium chloride  flush (NS) 0.9 % injection 3 mL (has no administration in time range)  iohexol  (OMNIPAQUE ) 350 MG/ML injection 75 mL (75 mLs Intravenous Contrast Given 09/02/24 1454)   Review of Systems  Neurological:  Positive for weakness.   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) 04/18/2022   BACK PAIN, LOW 11/19/2006   Qualifier: Diagnosis of  By: WATT, JOANNE     Benign essential hypertension 07/29/2020   Bronchitis 04/18/2022   Callus 04/18/2022    Cerebrovascular accident [facility]) 04/18/2022   Apr 25, 2020 Entered By: JULENE HAMILTON L Comment: MRI 2019 showed multiple infarcts throughout frontal and parietal white matter mixed ageAug 04, 2021 Entered By: JULENE HAMILTON L Comment: chronic infarcts in multiple other regions with severe chronic microvascular ischemic changes and moderate volume loss of brain   Chronic obstructive pulmonary disease (COPD) (HCC) 03/09/2022   Cirrhosis (HCC)    Cognitive impairment 04/18/2022   Diabetes mellitus without complication (HCC)    DM II (diabetes mellitus, type II), controlled (HCC) 07/29/2020   Dystrophia unguium 04/18/2022   Dystrophic nail 04/18/2022   Encephalopathy, hepatic (HCC) 07/29/2020   Enlarged prostate 04/18/2022   Femoral neck fracture (HCC) 04/18/2022   Apr 25, 2020 Entered By: JULENE HAMILTON L Comment: cannulated hip pinning on 09/04/2018   Fracture of femoral neck, right, closed (HCC) 09/02/2018   Gastro-esophageal reflux disease without esophagitis 04/18/2022   Hepatitis    Hepatitis C 04/18/2022   Hepatitis C antibody test positive 04/18/2022   Apr 14, 2016 Entered By: JADENE ALYSSA EARING Comment: SVR 04/08/16   History of cirrhosis    Hyperlipidemia 03/09/2022   Hypokalemia 07/29/2020   Kidney stone 04/18/2022   Low back pain 11/19/2006   Formatting of this note might be different from the original. 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   Mild cognitive impairment of uncertain or unknown etiology  04/18/2022   Multiple lacunar infarcts (HCC) 09/02/2018   Multiple pulmonary nodules 04/18/2022   Obstructive sleep apnea of adult 04/18/2022   Other general symptoms and signs 04/18/2022   Other specified problems related to psychosocial circumstances 04/18/2022   Shoulder joint painful on movement 04/18/2022   Simple renal cyst 04/18/2022   Sleep apnea 04/18/2022   Stroke [facility])    TIA (transient ischemic attack) 03/07/2022   TOBACCO  DEPENDENCE 11/19/2006   Qualifier: Diagnosis of  By: WATT, JOANNE     Tobacco dependence 11/19/2006   Formatting of this note might be different from the original. Overview:  Qualifier: Diagnosis of  By: WATT, JOANNE   Unsheltered homelessness 04/18/2022   Unspecified dementia, unspecified severity, without behavioral disturbance, psychotic disturbance, mood disturbance, and anxiety (HCC) 04/18/2022   Vascular dementia, unspecified severity, without behavioral disturbance, psychotic disturbance, mood disturbance, and anxiety (HCC) 04/18/2022   Visual hallucinations 07/29/2020   Past Surgical History:  Procedure Laterality Date   BACK SURGERY     HIP PINNING,CANNULATED Right 09/04/2018   Procedure: CANNULATED HIP PINNING;  Surgeon: Josefina Chew, MD;  Location: [facility] OR;  Service: Orthopedics;  Laterality: Right;   KNEE SURGERY     SHOULDER SURGERY      reports that he has been smoking cigarettes. He has never used smokeless tobacco. He reports current alcohol use. He reports that he does not use drugs. Allergies[1] Family History  Problem Relation Age of Onset   Stroke Mother    Osteoporosis Neg Hx    Prior to Admission medications  Medication Sig Start Date End Date Taking? Authorizing Provider  albuterol  (VENTOLIN  HFA) 108 (90 Base) MCG/ACT inhaler Inhale 2 puffs into the lungs 4 (four) times daily as needed for wheezing or shortness of breath.   Yes [provider]  amLODipine  (NORVASC ) 5 MG tablet Take 1 tablet (5 mg total) by mouth daily. 03/09/22 09/02/24 Yes Waddell Karna LABOR, NP  aspirin  81 MG chewable tablet Chew 81 mg by mouth daily.   Yes [provider]  atorvastatin  (LIPITOR) 40 MG tablet Take 1 tablet (40 mg total) by mouth daily at 6 PM. 08/05/22  Yes Addie Perkins, DO  cholecalciferol  (VITAMIN D ) 25 MCG tablet Take 2 tablets (2,000 Units total) by mouth daily. Patient taking differently: Take 1,000 Units by mouth daily. 03/10/22  Yes Waddell Karna LABOR, NP   clopidogrel  (PLAVIX ) 75 MG tablet Take 1 tablet (75 mg total) by mouth daily. 08/05/22  Yes Addie Perkins, DO  lactulose  (CHRONULAC ) 10 GM/15ML solution Take 7.5 mLs (5 g total) by mouth 2 (two) times daily. Patient taking differently: Take 20 g by mouth 2 (two) times daily as needed for mild constipation or moderate constipation. 08/05/22  Yes Addie Perkins, DO  metFORMIN  (GLUCOPHAGE -XR) 500 MG 24 hr tablet Take 500 mg by mouth 2 (two) times daily with a meal.   Yes [provider]  Oxycodone  HCl 10 MG TABS Take 10 mg by mouth 2 (two) times daily as needed.   Yes [provider]  vitamin B-12 (CYANOCOBALAMIN ) 500 MCG tablet Take 1,000 mcg by mouth daily. 03/19/21  Yes [provider]  albuterol  (PROVENTIL ) (2.5 MG/3ML) 0.083% nebulizer solution Take 3 mLs (2.5 mg total) by nebulization every 6 (six) hours as needed for wheezing or shortness of breath. Patient not taking: Reported on 10/12/2023 03/09/22   Waddell Karna LABOR, NP  Vitals:   09/02/24 1938 09/02/24 1939 09/02/24 1940 09/02/24 1941  BP:      Pulse: 93 79 77 88  Resp:      Temp:   98.2 F (36.8 C)   TempSrc:   Oral   SpO2: 98% 95% 97% 97%  Weight:       Physical Exam Vitals reviewed.  Constitutional:      General: He is not in acute distress.    Appearance: He is not ill-appearing.  HENT:     Head: Normocephalic.  Eyes:     General: No visual field deficit.    Extraocular Movements: Extraocular movements intact.  Cardiovascular:     Rate and Rhythm: Normal rate and regular rhythm.     Pulses:          Dorsalis pedis pulses are 1+ on the right side and 3+ on the left side.       Posterior tibial pulses are 1+ on the right side and 2+ on the left side.     Heart sounds: Normal heart sounds.  Pulmonary:     Effort: Pulmonary effort is normal.     Comments: Crackles all throughout lung field bl post and ant.  Abdominal:      General: There is no distension.     Palpations: Abdomen is soft.     Tenderness: There is no abdominal tenderness.  Neurological:     General: No focal deficit present.     Mental Status: He is alert. He is disoriented.     Cranial Nerves: Cranial nerves 2-12 are intact. No cranial nerve deficit, dysarthria or facial asymmetry.     Motor: Motor function is intact. No weakness, tremor, atrophy or abnormal muscle tone.     Coordination: Coordination normal. Finger-Nose-Finger Test and Heel to [facility] Test normal.     Deep Tendon Reflexes:     Reflex Scores:      Bicep reflexes are 2+ on the right side and 2+ on the left side.      Patellar reflexes are 2+ on the right side and 2+ on the left side. Psychiatric:        Attention and Perception: Attention normal.        Mood and Affect: Mood normal.        Speech: Speech normal.        Behavior: Behavior normal. Behavior is cooperative.        Cognition and Memory: Cognition normal.     Labs on Admission: I have personally reviewed following labs and imaging studies CBC: Recent Labs  Lab 09/02/24 1438 09/02/24 1441  WBC 4.6  --   NEUTROABS 3.3  --   HGB 13.3 13.6  HCT 42.0 40.0  MCV 90.9  --   PLT 95*  --    Basic Metabolic Panel: Recent Labs  Lab 09/02/24 1438 09/02/24 1441  NA 138 141  K 4.0 4.0  CL 105 101  CO2 24  --   GLUCOSE 134* 131*  BUN 5* 5*  CREATININE 0.74 0.90  CALCIUM  8.6*  --    GFR: CrCl cannot be calculated (Unknown ideal weight.). Liver Function Tests: Recent Labs  Lab 09/02/24 1438  AST 24  ALT 34  ALKPHOS 96  BILITOT 0.5  PROT 5.9*  ALBUMIN 3.6   No results for input(s): LIPASE, AMYLASE in the last 168 hours. Recent Labs  Lab 09/02/24 1553  AMMONIA 26   Recent Labs    10/12/23 1751 10/12/23 1815  09/02/24 1438 09/02/24 1441  BUN 9 12 5* 5*  CREATININE 0.90 0.90 0.74 0.90    Cardiac Enzymes: No results for input(s): CKTOTAL, CKMB, CKMBINDEX, TROPONINI in the last  168 hours. BNP (last 3 results) No results for input(s): PROBNP in the last 8760 hours. HbA1C: No results for input(s): HGBA1C in the last 72 hours. CBG: Recent Labs  Lab 09/02/24 1605  GLUCAP 90   Lipid Profile: No results for input(s): CHOL, HDL, LDLCALC, TRIG, CHOLHDL, LDLDIRECT in the last 72 hours. Thyroid Function Tests: No results for input(s): TSH, T4TOTAL, FREET4, T3FREE, THYROIDAB in the last 72 hours. Anemia Panel: No results for input(s): VITAMINB12, FOLATE, FERRITIN, TIBC, IRON, RETICCTPCT in the last 72 hours. Urine analysis:    Component Value Date/Time   COLORURINE YELLOW 08/04/2022 1734   APPEARANCEUR CLEAR 08/04/2022 1734   LABSPEC 1.019 08/04/2022 1734   PHURINE 5.0 08/04/2022 1734   GLUCOSEU NEGATIVE 08/04/2022 1734   HGBUR NEGATIVE 08/04/2022 1734   BILIRUBINUR NEGATIVE 08/04/2022 1734   KETONESUR 5 (A) 08/04/2022 1734   PROTEINUR NEGATIVE 08/04/2022 1734   NITRITE NEGATIVE 08/04/2022 1734   LEUKOCYTESUR NEGATIVE 08/04/2022 1734   Radiological Exams on Admission: MR BRAIN WO CONTRAST Result Date: 09/02/2024 EXAM: MRI BRAIN WITHOUT CONTRAST 09/02/2024 03:35:31 PM TECHNIQUE: Multiplanar multisequence MRI of the head/brain was performed without the administration of intravenous contrast. COMPARISON: MRI brain 03/07/2022. CLINICAL HISTORY: Neuro deficit, acute, stroke suspected. FINDINGS: BRAIN AND VENTRICLES: There is a small focus of restricted diffusion within the posterior right frontal lobe subcortical white matter with associated T2/FLAIR hyperintensity. No acute intracranial hemorrhage. No midline shift or mass effect. Overall similar severe and confluent subcortical and periventricular T2 and FLAIR signal hyperintensity, likely reflecting sequelae of chronic microvascular ischemia. There is also T2/FLAIR hyperintensity within the pons likely on the basis of chronic microvascular ischemia. Overall similar mild  generalized parenchymal volume loss advanced for the patient's stated age. Redemonstrated ex-vacuo dilatation of the left lateral ventricle. Redemonstrated bilateral basal ganglia, thalamic, and left corona radiata chronic infarctions. Redemonstrated foci of susceptibility within the right frontal lobe white matter and right thalamus which may relate to the sequela of remote microhemorrhage. Wallerian degeneration within the left cerebral peduncle. The sellar/suprasellar regions appear unremarkable. Exam is degraded by motion artifact. ORBITS: No acute abnormality. SINUSES AND MASTOIDS: Retention cyst left sphenoid sinus. Mucosal thickening right maxillary sinus. BONES AND SOFT TISSUES: Normal marrow signal. No acute soft tissue abnormality. IMPRESSION: 1. Small recent (acute or early subacute) infarction involving the posterior right frontal lobe subcortical white matter. 2. No acute intracranial hemorrhage. 3. No substantial change of additional chronic findings since 03/07/2022. 4. Exam is degraded by motion artifact. Electronically signed by: Prentice Spade 09/02/2024 04:38 PM EST RP Workstation: [id number]   CT ANGIO HEAD NECK W WO CM (CODE STROKE) Result Date: 09/02/2024 EXAM: CTA Head and Neck with Intravenous Contrast. CLINICAL HISTORY: Stroke, follow up. TECHNIQUE: Axial CTA images of the head and neck performed with intravenous contrast. MIP reconstructed images were created and reviewed. Note: Per PQRS, the description of internal carotid artery percent stenosis, including 0 percent or normal exam, is based on [employer] American Symptomatic Carotid Endarterectomy Trial (NASCET) criteria. Dose reduction technique was used including one or more of the following: automated exposure control, adjustment of mA and kV according to patient size, and/or iterative reconstruction. CONTRAST: With and without. COMPARISON: CTA head and neck 03/06/2022. FINDINGS: CTA NECK: COMMON CAROTID ARTERIES: No significant  stenosis. No dissection or occlusion. INTERNAL CAROTID ARTERIES:  No stenosis by NASCET criteria. There is mild stenosis of the bilateral paraclival internal carotid arteries. No dissection or occlusion. VERTEBRAL ARTERIES: The left intracranial vertebral artery primarily terminates as the posterior inferior cerebellar artery (PICA) with a faint distal segment traversing towards the basilar artery, similar to prior exam. No significant stenosis. No dissection or occlusion. CTA HEAD: ANTERIOR CEREBRAL ARTERIES: No significant stenosis. No occlusion. No aneurysm. MIDDLE CEREBRAL ARTERIES: No significant stenosis. No occlusion. No aneurysm. POSTERIOR CEREBRAL ARTERIES: There is multifocal moderate-to-severe stenosis of the left P3 segment, increased since prior exam (series 12, image 21). No occlusion. No aneurysm. BASILAR ARTERY: No significant stenosis. No occlusion. No aneurysm. OTHER: There is a left posterior communicating artery. SOFT TISSUES: No acute finding. No masses or lymphadenopathy. BONES: No acute osseous abnormality. LUNGS: Bilateral upper lung centrilobular pulmonary emphysema. IMPRESSION: 1. No large vessel occlusion or hemodynamically significant stenosis of the major intracranial or cervical arteries. This finding was communicated to Dr. Voncile via the [facility] text paging service at 3:06 pm. 2. Since 03/06/2022, increased multifocal moderate-to-severe stenosis of the left PCA P3 segment. Electronically signed by: Prentice Spade 09/02/2024 04:02 PM EST RP Workstation: [id number]   CT HEAD CODE STROKE WO CONTRAST Result Date: 09/02/2024 EXAM: CT HEAD WITHOUT CONTRAST 09/02/2024 02:42:16 PM TECHNIQUE: CT of the head was performed without the administration of intravenous contrast. Automated exposure control, iterative reconstruction, and/or weight based adjustment of the mA/kV was utilized to reduce the radiation dose to as low as reasonably achievable. COMPARISON: CT head 10/12/2023 and MRI brain  03/07/2022. CLINICAL HISTORY: Neuro deficit, acute, stroke suspected. FINDINGS: BRAIN AND VENTRICLES: No acute hemorrhage. No evidence of acute territorial infarct. No extra-axial collection. No mass effect or midline shift. There is overall similar mild parenchymal volume loss advanced for the patient's stated age. Overall similar confluent and severe periventricular scattered white matter hypodensities which are nonspecific but most commonly represent chronic microvascular ischemic changes. Re-demonstrated chronic left gangliocapsular infarction, chronic right basal ganglia lacunar infarctions, and chronic infarction in the left corona radiata. Ex-vacuo dilatation of the left lateral ventricle, again noted. ASPECTS 10. ORBITS: No acute abnormality. SINUSES: Mucosal thickening right maxillary sinus. Retention cyst left sphenoid sinus. SOFT TISSUES AND SKULL: No acute soft tissue abnormality. No skull fracture. Edentulous. IMPRESSION: 1. No acute intracranial hemorrhage or acute territorial infarction. This finding was communicated via the Amion text paging service to Dr. Voncile at 3:05 pm. 2. ASPECTS 10. 3. No substantial change in additional chronic findings since 10/12/2023. Electronically signed by: Prentice Spade 09/02/2024 03:24 PM EST RP Workstation: [id number]   Data Reviewed: Relevant notes from primary care and specialist visits, past discharge summaries as available in EHR, including Care Everywhere . Prior diagnostic testing as pertinent to current admission diagnoses, Updated medications and problem lists for reconciliation .ED course, including vitals, labs, imaging, treatment and response to treatment,Triage notes, nursing and pharmacy notes and ED provider's notes.Notable results as noted in HPI.Discussed case with EDMD/ ED APP/ or Specialty MD on call and as needed.  Assessment & Plan  >> Altered mental status:  At bedside Pt is alert, confused, smelling of urine and nonfocal. Differentials  include TIA versus CVA versus hepatic encephalopathy versus metabolic. Admit for complete stroke workup including permissive htn. A1c / lipid panel/ TSH, Ft4,2 d echo , EEG. Swallow eval.  PT/OT consult. Bedside swallow and speech eval.  Mri  today shows small punctate areas of stroke in the right hemisphere. LKW is uncertain and pt does not meet TNK criteria.    >>  Essential HTN: Vitals:   09/02/24 1538 09/02/24 1730  BP: (!) 152/81 (!) 155/82  Permissive htn goal.  Hold PTA meds - amlodipine .   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  Patient is on lactulose .  Current ammonia level is within normal limits. Cont lactulose  if pt passes swallow.    >> History of CVA: Patient has a history of residual aphasia and speech deficit.  Antiplatelet therapy plan  per neurology is to cont pt's DAPT.   >> Diabetes mellitus type 2: Swallow evaluation. Carb consistent diet.    >> Tobacco abuse/COPD: Currently stable will start patient on nicotine  patch as needed albuterol  as needed.   DVT prophylaxis:  Heparin.   Consults:  Neurology.  Advance Care Planning:    Code Status: Full Code   Family Communication:  None.   Disposition Plan:  Home.  Severity of Illness: The appropriate patient status for this patient is OBSERVATION. Observation status is judged to be reasonable and necessary in order to provide the required intensity of service to ensure the patient's safety. The patient's presenting symptoms, physical exam findings, and initial radiographic and laboratory data in the context of their medical condition is felt to place them at decreased risk for further clinical deterioration. Furthermore, it is anticipated that the patient will be medically stable for discharge from the hospital within 2 midnights of admission.   Unresulted Labs (From admission, onward)     Start     Ordered   09/03/24 0500  Lipid panel  (Labs)  Tomorrow  morning,   R       Comments: Fasting    09/02/24 1944   09/02/24 1943  T4, free  Add-on,   AD        09/02/24 1944   09/02/24 1943  TSH  Add-on,   AD        09/02/24 1944   09/02/24 1943  Vitamin B12  Add-on,   AD        09/02/24 1944   09/02/24 1943  C-reactive protein  Add-on,   AD        09/02/24 1944   09/02/24 1942  HIV Antibody (routine testing w rflx)  (HIV Antibody (Routine testing w reflex) panel)  Once,   R        09/02/24 1944   09/02/24 1942  Urine rapid drug screen (hosp performed)not at [facility])  Once,   R        09/02/24 1944   09/02/24 1942  Hemoglobin A1c  (Labs)  Once,   R       Comments: To assess prior glycemic control    09/02/24 1944   09/02/24 1933  Procalcitonin  Add-on,   AD        09/02/24 1933   09/02/24 1933  D-dimer, quantitative  Add-on,   AD        09/02/24 1933   09/02/24 1933  Magnesium   Add-on,   AD        09/02/24 1933           Meds ordered this encounter  Medications   sodium chloride  flush (NS) 0.9 % injection 3 mL   iohexol  (OMNIPAQUE ) 350 MG/ML injection 75 mL   albuterol  (VENTOLIN  HFA) 108 (90 Base) MCG/ACT inhaler 2 puff   aspirin  chewable tablet 81 mg   atorvastatin  (LIPITOR) tablet 40 mg   clopidogrel  (PLAVIX ) tablet 75 mg    stroke:  early stages of recovery book   0.9 %  sodium chloride  infusion   OR Linked Order Group    acetaminophen  (TYLENOL ) tablet 650 mg    acetaminophen  (TYLENOL ) 160 MG/5ML solution 650 mg    acetaminophen  (TYLENOL ) suppository 650 mg   heparin injection 5,000 Units   famotidine (PEPCID) IVPB 20 mg premix     Orders Placed This Encounter  Procedures   CT HEAD CODE STROKE WO CONTRAST   MR BRAIN WO CONTRAST   CT ANGIO HEAD NECK W WO CM (CODE STROKE)   DG Chest Portable 1 View   Protime-INR   APTT   CBC   Differential   Comprehensive metabolic panel   Ethanol   Ammonia   Procalcitonin   D-dimer, quantitative   Magnesium    HIV Antibody (routine testing w rflx)   Urine rapid drug  screen (hosp performed)not at [facility]   Lipid panel   Hemoglobin A1c   T4, free   TSH   Vitamin B12   C-reactive protein   Diet NPO time specified   ED Cardiac monitoring   NIH Stroke Scale   Saline Lock IV, Maintain IV access   If O2 sat <94% Administer O2 @ 2 Liters/Minute   NIHSS score documentation NIHSS score range: 0-42   Vital signs   Notify physician (specify)   OOB with assistance   Swallow screen - If patient does NOT pass this screen, place order for SLP eval and treat (SLP2) - swallowing evaluation (BSE, MBS and/or diet order as indicated)   NIH Stroke Scale   Intake and output   Cardiac Monitoring Continuous x 24 hours Indications for use: Acute neurological event   Apply Stroke Care Plan: Ischemic Stroke, TIA   Discuss with patient and document patient's goals for stroke risk factor reduction   Initiate Oral Care Protocol   Initiate Carrier Fluid Protocol   Provide stroke education material to patient and family.   Nurse to provide smoking / tobacco cessation education   If the patient has passed the Stroke Swallow Screen or has a feeding tube, then RN may order General Admission PRN Orders (through manage orders) for the following patient needs: allergy symptoms (Claritin), cold sores (Carmex), cough (Robitussin DM), eye irritation (Liquifilm Tears), hemorrhoids (Tucks), indigestion (Maalox), minor skin irritation (hydrocortisone cream), muscle pain Lucienne Gay), nose irritation (saline nasal spray) and sore throat (Chloraseptic spray).   RN to notify Physician for appropriate diet after patient passes stroke swallow screen   Full code   Consult for [facility] Medical Admission   Consult to Registered Dietitian   Consult to Transition of Care Team   OT eval and treat   PT eval and treat   ED Pulse oximetry, continuous   Oxygen therapy Mode or (Route): Nasal cannula; Liters Per Minute: 2; Keep O2 saturation between: greater than 94 %   SLP eval and treat Reason for  evaluation: Cognitive/Language evaluation   I-stat chem 8, ED   CBG monitoring, ED   EKG 12-Lead   ECHOCARDIOGRAM COMPLETE   EEG adult   Place in observation (patient's expected length of stay will be less than 2 midnights)   Fall precautions   Aspiration precautions   Seizure precautions   Author: Mario LULLA Blanch, MD 12 pm- 8 pm. [facility]. 09/02/2024 7:53 PM Please note for any communication after hours contact [facility] Assigned provider on call on Amion.     [1] No Known Allergies

## 2024-09-02 NOTE — Consult Note (Addendum)
 NEUROLOGY CONSULT NOTE   Date of service: September 02, 2024 Patient Name: Matthew Roman MRN:  [id number] DOB:  09/13/1956 Chief Complaint: Code stroke-confusion, aphasia, left-sided weakness/neglect Requesting Provider: Ellouise Richerd POUR, DO  History of Present Illness  Matthew Roman is a 68 y.o. male with hx of old stroke, ensuing dementia, multiple TIAs, cirrhosis, diabetes, hyperlipidemia, prior history of hepatic encephalopathy, tobacco use, sleep apnea, who lives with a caretaker, brought in for evaluation of sudden onset of confusion and left-sided neglect on EMS exam Presumably last known well was 11 AM but I could not get hold of any family member to confirm that.  He was having difficulty putting on his coat and doing his day-to-day activities that he is able to do otherwise without a problem.  When EMS was called, they assessed him, he did appear confused, he had some aphasia and also had some left sided neglect for them, leading them to activate a code stroke. 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) 04/18/2022   BACK PAIN, LOW 11/19/2006   Qualifier: Diagnosis of  By: WATT, JOANNE     Benign essential hypertension 07/29/2020   Bronchitis 04/18/2022   Callus 04/18/2022   Cerebrovascular accident (HCC) 04/18/2022   Apr 25, 2020 Entered By:  JULENE CHERENE CROME Comment: MRI 2019 showed multiple infarcts throughout frontal and parietal white matter mixed ageAug 04, 2021 Entered By: JULENE CHERENE L Comment: chronic infarcts in multiple other regions with severe chronic microvascular ischemic changes and moderate volume loss of brain   Chronic obstructive pulmonary disease (COPD) (HCC) 03/09/2022   Cirrhosis (HCC)    Cognitive impairment 04/18/2022   Diabetes mellitus without complication (HCC)    DM II (diabetes mellitus, type II), controlled (HCC) 07/29/2020   Dystrophia unguium 04/18/2022   Dystrophic nail 04/18/2022   Encephalopathy, hepatic (HCC) 07/29/2020   Enlarged prostate 04/18/2022   Femoral neck fracture (HCC) 04/18/2022   Apr 25, 2020 Entered By: JULENE CHERENE L Comment: cannulated hip pinning on 09/04/2018   Fracture of femoral neck, right, closed (HCC) 09/02/2018   Gastro-esophageal reflux disease without esophagitis 04/18/2022   Hepatitis    Hepatitis C 04/18/2022   Hepatitis C antibody test positive 04/18/2022   Apr 14, 2016 Entered By: JADENE ALYSSA EARING Comment: SVR 04/08/16   History of cirrhosis    Hyperlipidemia 03/09/2022   Hypokalemia 07/29/2020   Kidney stone 04/18/2022   Low back pain 11/19/2006   Formatting of this note might be different from the original. 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   Mild cognitive impairment of uncertain or unknown etiology 04/18/2022   Multiple lacunar infarcts (HCC) 09/02/2018   Multiple pulmonary nodules 04/18/2022   Obstructive sleep apnea of adult 04/18/2022   Other general symptoms and signs 04/18/2022   Other specified problems related to psychosocial circumstances  04/18/2022   Shoulder joint painful on movement 04/18/2022   Simple renal cyst 04/18/2022   Sleep apnea 04/18/2022   Stroke [facility])    TIA (transient ischemic attack) 03/07/2022   TOBACCO DEPENDENCE 11/19/2006   Qualifier: Diagnosis of  By: WATT, JOANNE      Tobacco dependence 11/19/2006   Formatting of this note might be different from the original. Overview:  Qualifier: Diagnosis of  By: WATT, JOANNE   Unsheltered homelessness 04/18/2022   Unspecified dementia, unspecified severity, without behavioral disturbance, psychotic disturbance, mood disturbance, and anxiety (HCC) 04/18/2022   Vascular dementia, unspecified severity, without behavioral disturbance, psychotic disturbance, mood disturbance, and anxiety (HCC) 04/18/2022   Visual hallucinations 07/29/2020    Past Surgical History:  Procedure Laterality Date   BACK SURGERY     HIP PINNING,CANNULATED Right 09/04/2018   Procedure: CANNULATED HIP PINNING;  Surgeon: Josefina Chew, MD;  Location: [facility] OR;  Service: Orthopedics;  Laterality: Right;   KNEE SURGERY     SHOULDER SURGERY      Family History: Family History  Problem Relation Age of Onset   Stroke Mother    Osteoporosis Neg Hx     Social History  reports that he has been smoking cigarettes. He has never used smokeless tobacco. He reports current alcohol use. He reports that he does not use drugs.  Allergies[1]  Medications  Current Medications[2]  Vitals   Vitals:   09-Sep-2024 1440 09/09/2024 1538  BP:  (!) 152/81  Pulse:  65  Resp:  13  Temp:  98.1 F (36.7 C)  TempSrc:  Oral  SpO2:  100%  Weight: 80.2 kg     Body mass index is 23.98 kg/m.   Physical Exam   General: Awake alert HEENT: Normocephalic atraumatic Lungs: Clear Cardiovascular: Regular rate rhythm Neurological exam He is awake alert oriented to self Could not tell me the current month or his correct age Naming is intact, repetition-he has word salad upon saying longer sentences.  Comprehension is intact Mild dysarthria Cranial nerves II to XII intact No drift in any of the 4 extremities Sensation intact light touch No dysmetria   Labs/Imaging/Neurodiagnostic studies   CBC:  Recent Labs  Lab 09-Sep-2024 1438 09/09/24 1441  WBC 4.6  --    NEUTROABS 3.3  --   HGB 13.3 13.6  HCT 42.0 40.0  MCV 90.9  --   PLT 95*  --    Basic Metabolic Panel:  Lab Results  Component Value Date   NA 141 September 09, 2024   K 4.0 09/09/2024   CO2 24 09/09/24   GLUCOSE 131 (H) 09-Sep-2024   BUN 5 (L) 09/09/24   CREATININE 0.90 2024-09-09   CALCIUM  8.6 (L) 09-09-2024   GFRNONAA >60 09/09/24   GFRAA >60 09/06/2018   Lipid Panel:  Lab Results  Component Value Date   LDLCALC 80 03/07/2022   HgbA1c:  Lab Results  Component Value Date   HGBA1C 5.6 03/07/2022   Urine Drug Screen:     Component Value Date/Time   LABOPIA NONE DETECTED 03/07/2022 0839   COCAINSCRNUR NONE DETECTED 03/07/2022 0839   LABBENZ NONE DETECTED 03/07/2022 0839   AMPHETMU NONE DETECTED 03/07/2022 0839   THCU NONE DETECTED 03/07/2022 0839   LABBARB NONE DETECTED 03/07/2022 0839    Alcohol Level     Component Value Date/Time   [facility] <15 09-09-2024 1438   INR  Lab Results  Component Value Date   INR 1.1 09-09-2024   APTT  Lab Results  Component  Value Date   APTT 29 09/02/2024   CT Head without contrast(Personally reviewed): No acute findings  CT angio Head and Neck with contrast(Personally reviewed): No ELVO   MRI Brain(Personally reviewed): Small punctate areas of restricted diffusion in the right cerebral hemisphere, with ADC correlate and also with FLAIR correlate suggestive of early subacute stroke  ASSESSMENT   Quency Tober is a 68 y.o. male with above past medical history that includes that of dementia and strokes, coming in for evaluation of confusion, left-sided weakness and possible left-sided neglect that was noted on scene. For me who had dysarthria, mild aphasia and got his LOC questions wrong for a total NIH of 4. I could not confirm his last known well with the family member-lives with a caretaker, because the only family member he has is a grandchild who is unable to care for him.  Attempted to call but unable to reach the  caretaker. He has had a prior similar presentation with negative MRI, which prompted me to get an MRI to make decisions on whether to use IV thrombolysis or not due to concern for increased risk of bleed given his liver disease history. MRI of the brain showed small punctate areas of stroke in the right hemisphere with flair correlates which makes me suspect that this is likely subacute-and that his his actual last known well [employer] these imagings findings is not what was provided and he is outside the window for IV thrombolysis.  Given the fact that the last known well might not be accurate as well as the fact that he has mild stroke symptoms-I made the decision not to offer TNK to him at this time.  During the last evaluation, question was raised whether he is having seizures, which could still be a possibility that she would benefit from an EEG in addition to the stroke workup  Impression:  Acute ischemic stroke Evaluate for underlying seizures  RECOMMENDATIONS  Admit to hospitalist Frequent neurochecks Telemetry He is supposed to be on DAPT at home.  Unclear if he is taking it. I would resume his DAPT. High intensity statin Check A1c and lipid panel.  A1c goal less than 7, LDL goal less than 70 2D echo Therapy assessments Permissive hypertension-allow permissive hypertension for another 24 to 48 hours and then start normalizing blood pressures.  Treat only if systolic blood pressures greater than 220 on a as needed basis for the next 24 to 48 hours. N.p.o. until cleared by bedside swallow formal swallow evaluation EEG Stroke team to follow Plan was discussed with Dr. Ellouise in the ER  ______________________________________________________________________    Signed, Eligio Lav, MD [facility]     [1] No Known Allergies [2]  Current Facility-Administered Medications:    sodium chloride  flush (NS) 0.9 % injection 3 mL, 3 mL, Intravenous, Once, Kingsley,  Victoria K, DO  Current Outpatient Medications:    albuterol  (PROVENTIL ) (2.5 MG/3ML) 0.083% nebulizer solution, Take 3 mLs (2.5 mg total) by nebulization every 6 (six) hours as needed for wheezing or shortness of breath. (Patient not taking: Reported on 10/12/2023), Disp: 75 mL, Rfl: 12   albuterol  (VENTOLIN  HFA) 108 (90 Base) MCG/ACT inhaler, Inhale 2 puffs into the lungs 4 (four) times daily as needed for wheezing or shortness of breath., Disp: , Rfl:    amLODipine  (NORVASC ) 5 MG tablet, Take 1 tablet (5 mg total) by mouth daily., Disp: 30 tablet, Rfl: 2   aspirin  81 MG chewable tablet, Chew 81 mg by mouth daily., Disp: ,  Rfl:    atorvastatin  (LIPITOR) 40 MG tablet, Take 1 tablet (40 mg total) by mouth daily at 6 PM., Disp: 30 tablet, Rfl: 2   cholecalciferol  (VITAMIN D ) 25 MCG tablet, Take 2 tablets (2,000 Units total) by mouth daily. (Patient taking differently: Take 1,000 Units by mouth daily.), Disp: 30 tablet, Rfl: 0   clopidogrel  (PLAVIX ) 75 MG tablet, Take 1 tablet (75 mg total) by mouth daily., Disp: 30 tablet, Rfl: 2   lactulose  (CHRONULAC ) 10 GM/15ML solution, Take 7.5 mLs (5 g total) by mouth 2 (two) times daily. (Patient taking differently: Take 20 g by mouth 2 (two) times daily as needed for mild constipation or moderate constipation.), Disp: 236 mL, Rfl: 2   metFORMIN  (GLUCOPHAGE -XR) 500 MG 24 hr tablet, Take 500 mg by mouth 2 (two) times daily with a meal., Disp: , Rfl:    Oxycodone  HCl 10 MG TABS, Take 10 mg by mouth See admin instructions. Take 10 mg by mouth at bedtime and an additional 10 mg once a day as needed for pain, Disp: , Rfl:    vitamin B-12 (CYANOCOBALAMIN ) 500 MCG tablet, Take 1,000 mcg by mouth daily., Disp: , Rfl:

## 2024-09-02 NOTE — ED Provider Notes (Signed)
 [place] EMERGENCY DEPARTMENT AT [facility] Provider Note   CSN: [id number] Arrival date & time: 09/02/24  1432  An emergency department physician performed an initial assessment on this suspected stroke patient at 1430.  Patient presents with: Code Stroke   Matthew Roman is a 68 y.o. male.   Patient is a 68 year old male with past medical history of cirrhosis on lactulose , hypertension, TIA/CVA, diabetes, dementia presenting to the emergency department as a code stroke.  History was obtained by the patient's caregiver who he lives with.  She reports that he was his normal self this morning and then she found him around 125 this afternoon standing with only 1 arm in his jacket.  She states that she asked him if he needed help with his jacket and he repeated needed help with my jacket.  She states that she helped him get his arm and his jacket and then asked him if he was okay.  She states that he just kept repeating anything that she said to him.  She states that she called his granddaughter who spoke to him on the phone and states that he did not sound abnormal and not like his normal self so she called 911.  She states that she tested his strength and he was able to hold up his arms and smile for him while they were waiting for the ambulance but she states that he seemed to not understand her questions and that she had to show him what to do for him to follow commands.  She states that he has otherwise been taking his lactulose  normally and has not had any other changes.  She states that they help him with his medications every day.  She states that he has had similar symptoms in the past when he has had strokes and TIAs in the past.  The history is provided by a relative.       Prior to Admission medications  Medication Sig Start Date End Date Taking? Authorizing Provider  albuterol  (VENTOLIN  HFA) 108 (90 Base) MCG/ACT inhaler Inhale 2 puffs into the lungs 4 (four) times  daily as needed for wheezing or shortness of breath.   Yes [provider]  triamcinolone cream (KENALOG) 0.5 % Apply topically. 05/07/17  Yes [provider]  albuterol  (PROVENTIL ) (2.5 MG/3ML) 0.083% nebulizer solution Take 3 mLs (2.5 mg total) by nebulization every 6 (six) hours as needed for wheezing or shortness of breath. Patient not taking: Reported on 10/12/2023 03/09/22   Waddell Karna LABOR, NP  amLODipine  (NORVASC ) 5 MG tablet Take 1 tablet (5 mg total) by mouth daily. 03/09/22 10/12/23  Waddell Karna LABOR, NP  aspirin  81 MG chewable tablet Chew 81 mg by mouth daily.    [provider]  atorvastatin  (LIPITOR) 40 MG tablet Take 1 tablet (40 mg total) by mouth daily at 6 PM. 08/05/22   Addie Perkins, DO  cholecalciferol  (VITAMIN D ) 25 MCG tablet Take 2 tablets (2,000 Units total) by mouth daily. Patient taking differently: Take 1,000 Units by mouth daily. 03/10/22   Waddell Karna LABOR, NP  clopidogrel  (PLAVIX ) 75 MG tablet Take 1 tablet (75 mg total) by mouth daily. 08/05/22   Addie Perkins, DO  lactulose  (CHRONULAC ) 10 GM/15ML solution Take 7.5 mLs (5 g total) by mouth 2 (two) times daily. Patient taking differently: Take 20 g by mouth 2 (two) times daily as needed for mild constipation or moderate constipation. 08/05/22   Addie Perkins, DO  metFORMIN  (GLUCOPHAGE -XR) 500 MG  24 hr tablet Take 500 mg by mouth 2 (two) times daily with a meal.    [provider]  Oxycodone  HCl 10 MG TABS Take 10 mg by mouth See admin instructions. Take 10 mg by mouth at bedtime and an additional 10 mg once a day as needed for pain    [provider]  vitamin B-12 (CYANOCOBALAMIN ) 500 MCG tablet Take 1,000 mcg by mouth daily. 03/19/21   [provider]    Allergies: Patient has no known allergies.    Review of Systems  Updated Vital Signs BP (!) 152/81 (BP Location: Right Arm)   Pulse 65   Temp 98.1 F (36.7 C) (Oral)   Resp 13   Wt 80.2 kg   SpO2 100%   BMI 23.98  kg/m   Physical Exam Vitals and nursing note reviewed.  Constitutional:      General: He is not in acute distress.    Appearance: Normal appearance.  HENT:     Head: Normocephalic and atraumatic.     Nose: Nose normal.     Mouth/Throat:     Mouth: Mucous membranes are moist.     Pharynx: Oropharynx is clear.  Eyes:     Extraocular Movements: Extraocular movements intact.     Conjunctiva/sclera: Conjunctivae normal.  Cardiovascular:     Rate and Rhythm: Normal rate and regular rhythm.     Heart sounds: Normal heart sounds.  Pulmonary:     Effort: Pulmonary effort is normal.     Breath sounds: Normal breath sounds.  Abdominal:     General: Abdomen is flat.     Palpations: Abdomen is soft.     Tenderness: There is no abdominal tenderness.  Musculoskeletal:        General: Normal range of motion.     Cervical back: Normal range of motion.  Skin:    General: Skin is warm and dry.  Neurological:     Mental Status: He is alert.     Cranial Nerves: No cranial nerve deficit.     Sensory: No sensory deficit.     Motor: No weakness.     Comments: Oriented to person and place - baseline Able to appropriate identify ink pen; when asked what watch is he just states ink pen No drift in all 4 extremities, Sensation intact in all 4 extremities, normal finger to nose bilaterally  Psychiatric:        Mood and Affect: Mood normal.        Behavior: Behavior normal.     (all labs ordered are listed, but only abnormal results are displayed) Labs Reviewed  CBC - Abnormal; Notable for the following components:      Result Value   Platelets 95 (*)    All other components within normal limits  COMPREHENSIVE METABOLIC PANEL WITH GFR - Abnormal; Notable for the following components:   Glucose, Bld 134 (*)    BUN 5 (*)    Calcium  8.6 (*)    Total Protein 5.9 (*)    All other components within normal limits  I-STAT CHEM 8, ED - Abnormal; Notable for the following components:   BUN 5 (*)     Glucose, Bld 131 (*)    Calcium , Ion 1.11 (*)    All other components within normal limits  PROTIME-INR  APTT  DIFFERENTIAL  ETHANOL  AMMONIA  CBG MONITORING, ED    EKG: None  Radiology: MR BRAIN WO CONTRAST Result Date: 09/02/2024 EXAM: MRI BRAIN WITHOUT  CONTRAST 09/02/2024 03:35:31 PM TECHNIQUE: Multiplanar multisequence MRI of the head/brain was performed without the administration of intravenous contrast. COMPARISON: MRI brain 03/07/2022. CLINICAL HISTORY: Neuro deficit, acute, stroke suspected. FINDINGS: BRAIN AND VENTRICLES: There is a small focus of restricted diffusion within the posterior right frontal lobe subcortical white matter with associated T2/FLAIR hyperintensity. No acute intracranial hemorrhage. No midline shift or mass effect. Overall similar severe and confluent subcortical and periventricular T2 and FLAIR signal hyperintensity, likely reflecting sequelae of chronic microvascular ischemia. There is also T2/FLAIR hyperintensity within the pons likely on the basis of chronic microvascular ischemia. Overall similar mild generalized parenchymal volume loss advanced for the patient's stated age. Redemonstrated ex-vacuo dilatation of the left lateral ventricle. Redemonstrated bilateral basal ganglia, thalamic, and left corona radiata chronic infarctions. Redemonstrated foci of susceptibility within the right frontal lobe white matter and right thalamus which may relate to the sequela of remote microhemorrhage. Wallerian degeneration within the left cerebral peduncle. The sellar/suprasellar regions appear unremarkable. Exam is degraded by motion artifact. ORBITS: No acute abnormality. SINUSES AND MASTOIDS: Retention cyst left sphenoid sinus. Mucosal thickening right maxillary sinus. BONES AND SOFT TISSUES: Normal marrow signal. No acute soft tissue abnormality. IMPRESSION: 1. Small recent (acute or early subacute) infarction involving the posterior right frontal lobe subcortical  white matter. 2. No acute intracranial hemorrhage. 3. No substantial change of additional chronic findings since 03/07/2022. 4. Exam is degraded by motion artifact. Electronically signed by: Prentice Spade 09/02/2024 04:38 PM EST RP Workstation: [id number]   CT ANGIO HEAD NECK W WO CM (CODE STROKE) Result Date: 09/02/2024 EXAM: CTA Head and Neck with Intravenous Contrast. CLINICAL HISTORY: Stroke, follow up. TECHNIQUE: Axial CTA images of the head and neck performed with intravenous contrast. MIP reconstructed images were created and reviewed. Note: Per PQRS, the description of internal carotid artery percent stenosis, including 0 percent or normal exam, is based on [employer] American Symptomatic Carotid Endarterectomy Trial (NASCET) criteria. Dose reduction technique was used including one or more of the following: automated exposure control, adjustment of mA and kV according to patient size, and/or iterative reconstruction. CONTRAST: With and without. COMPARISON: CTA head and neck 03/06/2022. FINDINGS: CTA NECK: COMMON CAROTID ARTERIES: No significant stenosis. No dissection or occlusion. INTERNAL CAROTID ARTERIES: No stenosis by NASCET criteria. There is mild stenosis of the bilateral paraclival internal carotid arteries. No dissection or occlusion. VERTEBRAL ARTERIES: The left intracranial vertebral artery primarily terminates as the posterior inferior cerebellar artery (PICA) with a faint distal segment traversing towards the basilar artery, similar to prior exam. No significant stenosis. No dissection or occlusion. CTA HEAD: ANTERIOR CEREBRAL ARTERIES: No significant stenosis. No occlusion. No aneurysm. MIDDLE CEREBRAL ARTERIES: No significant stenosis. No occlusion. No aneurysm. POSTERIOR CEREBRAL ARTERIES: There is multifocal moderate-to-severe stenosis of the left P3 segment, increased since prior exam (series 12, image 21). No occlusion. No aneurysm. BASILAR ARTERY: No significant stenosis. No occlusion.  No aneurysm. OTHER: There is a left posterior communicating artery. SOFT TISSUES: No acute finding. No masses or lymphadenopathy. BONES: No acute osseous abnormality. LUNGS: Bilateral upper lung centrilobular pulmonary emphysema. IMPRESSION: 1. No large vessel occlusion or hemodynamically significant stenosis of the major intracranial or cervical arteries. This finding was communicated to Dr. Voncile via the [facility] text paging service at 3:06 pm. 2. Since 03/06/2022, increased multifocal moderate-to-severe stenosis of the left PCA P3 segment. Electronically signed by: Prentice Spade 09/02/2024 04:02 PM EST RP Workstation: [id number]   CT HEAD CODE STROKE WO CONTRAST Result Date: 09/02/2024 EXAM: CT HEAD WITHOUT  CONTRAST 09/02/2024 02:42:16 PM TECHNIQUE: CT of the head was performed without the administration of intravenous contrast. Automated exposure control, iterative reconstruction, and/or weight based adjustment of the mA/kV was utilized to reduce the radiation dose to as low as reasonably achievable. COMPARISON: CT head 10/12/2023 and MRI brain 03/07/2022. CLINICAL HISTORY: Neuro deficit, acute, stroke suspected. FINDINGS: BRAIN AND VENTRICLES: No acute hemorrhage. No evidence of acute territorial infarct. No extra-axial collection. No mass effect or midline shift. There is overall similar mild parenchymal volume loss advanced for the patient's stated age. Overall similar confluent and severe periventricular scattered white matter hypodensities which are nonspecific but most commonly represent chronic microvascular ischemic changes. Re-demonstrated chronic left gangliocapsular infarction, chronic right basal ganglia lacunar infarctions, and chronic infarction in the left corona radiata. Ex-vacuo dilatation of the left lateral ventricle, again noted. ASPECTS 10. ORBITS: No acute abnormality. SINUSES: Mucosal thickening right maxillary sinus. Retention cyst left sphenoid sinus. SOFT TISSUES AND SKULL: No acute  soft tissue abnormality. No skull fracture. Edentulous. IMPRESSION: 1. No acute intracranial hemorrhage or acute territorial infarction. This finding was communicated via the [employer] text paging service to Dr. Voncile at 3:05 pm. 2. ASPECTS 10. 3. No substantial change in additional chronic findings since 10/12/2023. Electronically signed by: Prentice Spade 09/02/2024 03:24 PM EST RP Workstation: [id number]     Procedures   Medications Ordered in the ED  sodium chloride  flush (NS) 0.9 % injection 3 mL (has no administration in time range)  iohexol  (OMNIPAQUE ) 350 MG/ML injection 75 mL (75 mLs Intravenous Contrast Given 09/02/24 1454)    Clinical Course as of 09/02/24 1707  Fri Sep 02, 2024  1600 Dr. Voncile recommends admission for stroke work up. MRI appears to be slightly older stroke putting him outside the TNK window. Will plan to do EEG as well for possible Todd's paralysis/post ictal state.  [VK]  1704 Upon reassessment, the patient has some improvement of his symptoms and is having an easier time following commands compared to when his family called 911 however they report that he is not completely back to his baseline.  Patient will be admitted for stroke workup. [VK]    Clinical Course User Index [VK] Kingsley, Marjoria Mancillas K, DO                                 Medical Decision Making This patient presents to the ED with chief complaint(s) of code stroke/aphasia with pertinent past medical history of CVA/TIA, dementia, diabetes, hypertension, cirrhosis which further complicates the presenting complaint. The complaint involves an extensive differential diagnosis and also carries with it a high risk of complications and morbidity.    The differential diagnosis includes CVA/TIA, hypo or hyperglycemia, hepatic encephalopathy, infection, electrolyte abnormality, ICH, mass effect, seizure  Additional history obtained: Additional history obtained from family Records reviewed Care  Everywhere/External Records  ED Course and Reassessment: Patient was made a prehospital code stroke and was immediately met by neurology at the door and airway was cleared by previous physician prior to my signout.  The patient was immediately brought to CT and then down to MRI to evaluate for stroke to see if he may be a TNK candidate.  The patient was down an MRI on my evaluation and arrival.  Independent labs interpretation:  The following labs were independently interpreted: within normal range  Independent visualization of imaging: - I independently visualized the following imaging with scope of interpretation limited to determining  acute life threatening conditions related to emergency care: CTH/CTA, MRI, which revealed small acute to subacute infarct  Consultation: - Consulted or discussed management/test interpretation w/ external professional: neurology, hospitalist  Consideration for admission or further workup: patient requires admission for stroke work up  Social Determinants of health: N/A    Amount and/or Complexity of Data Reviewed Labs: ordered. Radiology: ordered.  Risk Decision regarding hospitalization.       Final diagnoses:  Cerebral infarction, unspecified mechanism [facility])    ED Discharge Orders     None          Kingsley, Canon Gola K, DO 09/02/24 1707

## 2024-09-03 ENCOUNTER — Observation Stay (HOSPITAL_COMMUNITY)

## 2024-09-03 ENCOUNTER — Encounter (HOSPITAL_COMMUNITY): Payer: Self-pay | Admitting: Internal Medicine

## 2024-09-03 DIAGNOSIS — R410 Disorientation, unspecified: Secondary | ICD-10-CM | POA: Diagnosis not present

## 2024-09-03 DIAGNOSIS — I5030 Unspecified diastolic (congestive) heart failure: Secondary | ICD-10-CM

## 2024-09-03 DIAGNOSIS — R569 Unspecified convulsions: Secondary | ICD-10-CM

## 2024-09-03 DIAGNOSIS — R4182 Altered mental status, unspecified: Secondary | ICD-10-CM | POA: Diagnosis not present

## 2024-09-03 LAB — LIPID PANEL
Cholesterol: 107 mg/dL (ref 0–200)
HDL: 33 mg/dL — ABNORMAL LOW (ref >40)
LDL Cholesterol: 58 mg/dL (ref 0–99)
Total CHOL/HDL Ratio: 3.2 ratio
Triglycerides: 79 mg/dL (ref <150)
VLDL: 16 mg/dL (ref 0–40)

## 2024-09-03 LAB — ECHOCARDIOGRAM COMPLETE
Calc EF: 61.7 %
S' Lateral: 3 cm
Single Plane A2C EF: 65.7 %
Single Plane A4C EF: 57.1 %
Weight: 2828.94 [oz_av]

## 2024-09-03 LAB — CBG MONITORING, ED
Glucose-Capillary: 107 mg/dL — ABNORMAL HIGH (ref 70–99)
Glucose-Capillary: 117 mg/dL — ABNORMAL HIGH (ref 70–99)

## 2024-09-03 LAB — GLUCOSE, CAPILLARY
Glucose-Capillary: 103 mg/dL — ABNORMAL HIGH (ref 70–99)
Glucose-Capillary: 169 mg/dL — ABNORMAL HIGH (ref 70–99)
Glucose-Capillary: 94 mg/dL (ref 70–99)

## 2024-09-03 MED ORDER — FAMOTIDINE 20 MG PO TABS
20.0000 mg | ORAL_TABLET | Freq: Two times a day (BID) | ORAL | Status: AC
  Administered 2024-09-03 – 2024-10-28 (×109): 20 mg via ORAL
  Filled 2024-09-03 (×97): qty 1

## 2024-09-03 MED ORDER — LORAZEPAM 0.5 MG PO TABS
0.5000 mg | ORAL_TABLET | Freq: Once | ORAL | Status: AC | PRN
  Administered 2024-09-03: 0.5 mg via ORAL
  Filled 2024-09-03: qty 1

## 2024-09-03 NOTE — Progress Notes (Signed)
 OT Cancellation Note  Patient Details Name: Matthew Roman MRN: [id number] DOB: September 14, 1956   Cancelled Treatment:    Reason Eval/Treat Not Completed: Patient at procedure or test/ unavailable (OTF)  Jaemarie Hochberg K, OTD, OTR/L SecureChat Preferred Acute Rehab [phone]   Laneta MARLA Pereyra 09/03/2024, 7:41 AM

## 2024-09-03 NOTE — Procedures (Signed)
 Patient Name: Matthew Roman  MRN: [id number]  Epilepsy Attending: Arlin MALVA Krebs  Referring Physician/Provider: Tobie Mario GAILS, MD  Date: 09/03/2024 Duration: 24.09 mins  Patient history: 68yo M with sudden onset of confusion and left-sided neglect. EEG to evaluate for seizure.  Level of alertness: Awake  AEDs during EEG study: None  Technical aspects: This EEG study was done with scalp electrodes positioned according to the 10-20 International system of electrode placement. Electrical activity was reviewed with band pass filter of 1-70Hz , sensitivity of 7 uV/mm, display speed of 25mm/sec with a 60Hz  notched filter applied as appropriate. EEG data were recorded continuously and digitally stored.  Video monitoring was available and reviewed as appropriate.  Description: The posterior dominant rhythm consists of 8 Hz activity of moderate voltage (25-35 uV) seen predominantly in posterior head regions, symmetric and reactive to eye opening and eye closing. There is intermittent generalized 3 to 6 Hz theta-delta slowing. Hyperventilation and photic stimulation were not performed.     ABNORMALITY - Intermittent slow, generalized  IMPRESSION: This study is suggestive of generalized cerebral dysfunction (encephalopathy). No seizures or epileptiform discharges were seen throughout the recording.  Annielee Jemmott O Nathyn Luiz

## 2024-09-03 NOTE — Progress Notes (Signed)
 Routine EEG completed, results pending Neurology review and interpretation

## 2024-09-03 NOTE — Progress Notes (Signed)
 Inpatient Rehab Admissions Coordinator Note:   Per therapy patient was screened for CIR candidacy by Alysson Geist SHAUNNA Yvone Cohens, CCC-SLP. At this time, pt appears to be a potential candidate for CIR. I will place an order for rehab consult for full assessment, per our protocol.  Please contact me any with questions.[name] Tinnie Yvone Cohens, MS, CCC-SLP Admissions Coordinator [phone] 09/03/2024 4:39 PM

## 2024-09-03 NOTE — Progress Notes (Signed)
°  Echocardiogram 2D Echocardiogram has been performed.  Matthew Roman 09/03/2024, 12:16 PM

## 2024-09-03 NOTE — Progress Notes (Addendum)
 PROGRESS NOTE    Matthew Roman  FMW:[id number] DOB: 02/27/1956 DOA: 09/02/2024 PCP: [facility]   Brief Narrative:  Matthew Roman is a 68 y.o. male with hx of old stroke, ensuing dementia, multiple TIAs, cirrhosis, diabetes, hyperlipidemia, prior history of hepatic encephalopathy, tobacco use, sleep apnea, who lives with a caretaker, brought in for evaluation of sudden onset of confusion and left-sided neglect on EMS exam Presumably last known well was 11 AM 09/02/2024.  He was having difficulty putting on his coat and doing his day-to-day activities that he is able to do otherwise without a problem.  When EMS was called, they assessed him, he did appear confused, he had some aphasia and also had some left sided neglect for them, leading them to activate a code stroke.  He was admitted under hospital service, neurology consulted.  Details below.  Assessment & Plan:   Principal Problem:   AMS (altered mental status)  Acute metabolic encephalopathy secondary to acute ischemic stroke: Initial CT head negative, CTA head and neck negative for LVO, MRI brain eventually showed small acute or early subacute infarction involving posterior right frontal lobe subcortical white matter.  Reportedly, due to previous history of strokes, patient was supposed to be on DAPT however unsure whether he was taking them.  Regardless, they have been resumed by neurology along with atorvastatin .  I was able to speak to patient's caregiver  Quentin Kurk) who he lives with, according to her, at baseline he does have some confusion and he does not typically know what month or date is.  However yesterday he was more confused.  Based on my assessment today, patient was able to tell me his date of birth and his name but nothing else.  Sounds like he is very close to his baseline.  Also, his caretaker told me that due to his recurrent confusion, she is not sure if her home has safer any longer for him and she was  considering placing him into a long-term care facility.  I informed her that she will have to look into that but I will notify our social worker to see if they can help her anyway.  Patient still needs to be seen by PT OT.  Echo ruled out PFO.  Essential hypertension: PTA medications include amlodipine  which is on hold to allow permissive hypertension for another 24 hours.  Type II obese mellitus, non-insulin -dependent: Patient on metformin  at home which is on hold.  Hemoglobin A1c only 5.5.  Currently on SSI.  Hyperlipidemia: Started on atorvastatin .  Lipid panel pending.  Unsure why patient was tested for D-dimer.  Patient has no respiratory symptoms and no leg edema raising any concern for PE or DVT.  Slightly elevated D-dimer can be seen in many cases and do not always indicate thromboembolism.  Admitting hospitalist has ordered VQ scan while patient is renal function is within normal range and CTA could have been obtained. In the setting of patient having no respiratory symptoms and no hypoxia, I do not suspect any thromboembolism.  I will discontinue VQ scan which at [facility] as not possible to get over the weekend anyways.  Tobacco abuse: I have discussed tobacco cessation with the patient.  I have counseled the patient regarding the negative impacts of continued tobacco use including but not limited to lung cancer, COPD, and cardiovascular disease.  I have discussed alternatives to tobacco and modalities that may help facilitate tobacco cessation including but not limited to biofeedback, hypnosis, and medications.  Total  time spent with tobacco counseling was 5 minutes.  Chronic thrombocytopenia: Stable.   DVT prophylaxis: heparin  injection 5,000 Units Start: 09/03/24 0600   Code Status: Full Code  Family Communication:  None present at bedside.  Discussed with caretaker.  Status is: Observation The patient will require care spanning > 2 midnights and should be moved to inpatient  because: Needs to be seen by PT OT, not safe for discharge back to his previous living situation.   Estimated body mass index is 23.98 kg/m as calculated from the following:   Height as of 08/04/22: 6' (1.829 m).   Weight as of this encounter: 80.2 kg.    Nutritional Assessment: Body mass index is 23.98 kg/m.[name] Seen by dietician.  I agree with the assessment and plan as outlined below: Nutrition Status:        . Skin Assessment: I have examined the patient's skin and I agree with the wound assessment as performed by the wound care RN as outlined below:    Consultants:  Neurology  Procedures:  As above  Antimicrobials:  Anti-infectives (From admission, onward)    None         Subjective: Seen and examined, nurse at the bedside.  Patient was sitting at the edge of the bed.  His only complaint was that he was hungry.  He had no other complaint.  He was able to tell me his name and date of birth but nothing else.  He is answer to most questions was  [facility] he appeared to have word finding difficulty.  Objective: Vitals:   09/03/24 0015 09/03/24 0200 09/03/24 0600 09/03/24 0754  BP:  138/87 (!) 153/86 (!) 157/82  Pulse: 69 75 66 70  Resp: (!) 23 (!) 22 (!) 22 20  Temp:  98.4 F (36.9 C) 98.4 F (36.9 C) 98.1 F (36.7 C)  TempSrc:  Oral Oral Oral  SpO2: 97% 98% 100% 98%  Weight:        Intake/Output Summary (Last 24 hours) at 09/03/2024 0843 Last data filed at 09/03/2024 0758 Gross per 24 hour  Intake 413.55 ml  Output --  Net 413.55 ml   Filed Weights   09/02/24 1440  Weight: 80.2 kg    Examination:  General exam: Appears calm and comfortable  Respiratory system: Clear to auscultation. Respiratory effort normal. Cardiovascular system: S1 & S2 heard, RRR. No JVD, murmurs, rubs, gallops or clicks. No pedal edema. Gastrointestinal system: Abdomen is nondistended, soft and nontender. No organomegaly or masses felt. Normal bowel sounds  heard. Central nervous system: Alert and oriented to self only.  No focal deficit. Extremities: Symmetric 5 x 5 power. Skin: No rashes, lesions or ulcers Psychiatry: Judgement and insight appear poor   Data Reviewed: I have personally reviewed following labs and imaging studies  CBC: Recent Labs  Lab 09/02/24 1438 09/02/24 1441  WBC 4.6  --   NEUTROABS 3.3  --   HGB 13.3 13.6  HCT 42.0 40.0  MCV 90.9  --   PLT 95*  --    Basic Metabolic Panel: Recent Labs  Lab 09/02/24 1438 09/02/24 1441 09/02/24 2013  NA 138 141  --   K 4.0 4.0  --   CL 105 101  --   CO2 24  --   --   GLUCOSE 134* 131*  --   BUN 5* 5*  --   CREATININE 0.74 0.90  --   CALCIUM  8.6*  --   --   MG  --   --  1.6*   GFR: CrCl cannot be calculated (Unknown ideal weight.). Liver Function Tests: Recent Labs  Lab 09/02/24 1438  AST 24  ALT 34  ALKPHOS 96  BILITOT 0.5  PROT 5.9*  ALBUMIN 3.6   No results for input(s): LIPASE, AMYLASE in the last 168 hours. Recent Labs  Lab 09/02/24 1553  AMMONIA 26   Coagulation Profile: Recent Labs  Lab 09/02/24 1438  INR 1.1   Cardiac Enzymes: No results for input(s): CKTOTAL, CKMB, CKMBINDEX, TROPONINI in the last 168 hours. BNP (last 3 results) No results for input(s): PROBNP in the last 8760 hours. HbA1C: Recent Labs    09/02/24 2017  HGBA1C 5.5   CBG: Recent Labs  Lab 09/02/24 1605 09/02/24 2123 09/03/24 0347 09/03/24 0721  GLUCAP 90 87 107* 117*   Lipid Profile: No results for input(s): CHOL, HDL, LDLCALC, TRIG, CHOLHDL, LDLDIRECT in the last 72 hours. Thyroid Function Tests: Recent Labs    09/02/24 2013  TSH 1.004  FREET4 0.81   Anemia Panel: Recent Labs    09/02/24 2013  VITAMINB12 1,221*   Sepsis Labs: Recent Labs  Lab 09/02/24 2013  PROCALCITON <0.10    No results found for this or any previous visit (from the past 240 hours).   Radiology Studies: DG Chest Portable 1 View Result Date:  09/02/2024 EXAM: 1 VIEW XRAY OF THE CHEST 09/02/2024 08:14:16 PM COMPARISON: 10/12/2023 CLINICAL HISTORY: ams FINDINGS: LUNGS AND PLEURA: No focal pulmonary opacity. No pleural effusion. No pneumothorax. HEART AND MEDIASTINUM: Aortic atherosclerosis. BONES AND SOFT TISSUES: No acute osseous abnormality. IMPRESSION: 1. No acute cardiopulmonary abnormality. Electronically signed by: Oneil Devonshire MD 09/02/2024 08:25 PM EST RP Workstation: [id number]   MR BRAIN WO CONTRAST Result Date: 09/02/2024 EXAM: MRI BRAIN WITHOUT CONTRAST 09/02/2024 03:35:31 PM TECHNIQUE: Multiplanar multisequence MRI of the head/brain was performed without the administration of intravenous contrast. COMPARISON: MRI brain 03/07/2022. CLINICAL HISTORY: Neuro deficit, acute, stroke suspected. FINDINGS: BRAIN AND VENTRICLES: There is a small focus of restricted diffusion within the posterior right frontal lobe subcortical white matter with associated T2/FLAIR hyperintensity. No acute intracranial hemorrhage. No midline shift or mass effect. Overall similar severe and confluent subcortical and periventricular T2 and FLAIR signal hyperintensity, likely reflecting sequelae of chronic microvascular ischemia. There is also T2/FLAIR hyperintensity within the pons likely on the basis of chronic microvascular ischemia. Overall similar mild generalized parenchymal volume loss advanced for the patient's stated age. Redemonstrated ex-vacuo dilatation of the left lateral ventricle. Redemonstrated bilateral basal ganglia, thalamic, and left corona radiata chronic infarctions. Redemonstrated foci of susceptibility within the right frontal lobe white matter and right thalamus which may relate to the sequela of remote microhemorrhage. Wallerian degeneration within the left cerebral peduncle. The sellar/suprasellar regions appear unremarkable. Exam is degraded by motion artifact. ORBITS: No acute abnormality. SINUSES AND MASTOIDS: Retention cyst left sphenoid  sinus. Mucosal thickening right maxillary sinus. BONES AND SOFT TISSUES: Normal marrow signal. No acute soft tissue abnormality. IMPRESSION: 1. Small recent (acute or early subacute) infarction involving the posterior right frontal lobe subcortical white matter. 2. No acute intracranial hemorrhage. 3. No substantial change of additional chronic findings since 03/07/2022. 4. Exam is degraded by motion artifact. Electronically signed by: Prentice Spade 09/02/2024 04:38 PM EST RP Workstation: [id number]   CT ANGIO HEAD NECK W WO CM (CODE STROKE) Result Date: 09/02/2024 EXAM: CTA Head and Neck with Intravenous Contrast. CLINICAL HISTORY: Stroke, follow up. TECHNIQUE: Axial CTA images of the head and neck performed with intravenous contrast. MIP reconstructed images  were created and reviewed. Note: Per PQRS, the description of internal carotid artery percent stenosis, including 0 percent or normal exam, is based on [employer] American Symptomatic Carotid Endarterectomy Trial (NASCET) criteria. Dose reduction technique was used including one or more of the following: automated exposure control, adjustment of mA and kV according to patient size, and/or iterative reconstruction. CONTRAST: With and without. COMPARISON: CTA head and neck 03/06/2022. FINDINGS: CTA NECK: COMMON CAROTID ARTERIES: No significant stenosis. No dissection or occlusion. INTERNAL CAROTID ARTERIES: No stenosis by NASCET criteria. There is mild stenosis of the bilateral paraclival internal carotid arteries. No dissection or occlusion. VERTEBRAL ARTERIES: The left intracranial vertebral artery primarily terminates as the posterior inferior cerebellar artery (PICA) with a faint distal segment traversing towards the basilar artery, similar to prior exam. No significant stenosis. No dissection or occlusion. CTA HEAD: ANTERIOR CEREBRAL ARTERIES: No significant stenosis. No occlusion. No aneurysm. MIDDLE CEREBRAL ARTERIES: No significant stenosis. No  occlusion. No aneurysm. POSTERIOR CEREBRAL ARTERIES: There is multifocal moderate-to-severe stenosis of the left P3 segment, increased since prior exam (series 12, image 21). No occlusion. No aneurysm. BASILAR ARTERY: No significant stenosis. No occlusion. No aneurysm. OTHER: There is a left posterior communicating artery. SOFT TISSUES: No acute finding. No masses or lymphadenopathy. BONES: No acute osseous abnormality. LUNGS: Bilateral upper lung centrilobular pulmonary emphysema. IMPRESSION: 1. No large vessel occlusion or hemodynamically significant stenosis of the major intracranial or cervical arteries. This finding was communicated to Dr. Voncile via the [facility] text paging service at 3:06 pm. 2. Since 03/06/2022, increased multifocal moderate-to-severe stenosis of the left PCA P3 segment. Electronically signed by: Prentice Spade 09/02/2024 04:02 PM EST RP Workstation: [id number]   CT HEAD CODE STROKE WO CONTRAST Result Date: 09/02/2024 EXAM: CT HEAD WITHOUT CONTRAST 09/02/2024 02:42:16 PM TECHNIQUE: CT of the head was performed without the administration of intravenous contrast. Automated exposure control, iterative reconstruction, and/or weight based adjustment of the mA/kV was utilized to reduce the radiation dose to as low as reasonably achievable. COMPARISON: CT head 10/12/2023 and MRI brain 03/07/2022. CLINICAL HISTORY: Neuro deficit, acute, stroke suspected. FINDINGS: BRAIN AND VENTRICLES: No acute hemorrhage. No evidence of acute territorial infarct. No extra-axial collection. No mass effect or midline shift. There is overall similar mild parenchymal volume loss advanced for the patient's stated age. Overall similar confluent and severe periventricular scattered white matter hypodensities which are nonspecific but most commonly represent chronic microvascular ischemic changes. Re-demonstrated chronic left gangliocapsular infarction, chronic right basal ganglia lacunar infarctions, and chronic  infarction in the left corona radiata. Ex-vacuo dilatation of the left lateral ventricle, again noted. ASPECTS 10. ORBITS: No acute abnormality. SINUSES: Mucosal thickening right maxillary sinus. Retention cyst left sphenoid sinus. SOFT TISSUES AND SKULL: No acute soft tissue abnormality. No skull fracture. Edentulous. IMPRESSION: 1. No acute intracranial hemorrhage or acute territorial infarction. This finding was communicated via the Amion text paging service to Dr. Voncile at 3:05 pm. 2. ASPECTS 10. 3. No substantial change in additional chronic findings since 10/12/2023. Electronically signed by: Prentice Spade 09/02/2024 03:24 PM EST RP Workstation: [id number]    Scheduled Meds:   stroke: early stages of recovery book   Does not apply Once   aspirin   81 mg Oral Daily   atorvastatin   40 mg Oral q1800   clopidogrel   75 mg Oral Daily   heparin   5,000 Units Subcutaneous Q8H   insulin  aspart  0-6 Units Subcutaneous Q4H   sodium chloride  flush  3 mL Intravenous Once   Continuous Infusions:  sodium chloride  50 mL/hr at 09/03/24 0758   famotidine  (PEPCID ) IV Stopped (09/02/24 2323)     LOS: 0 days   Fredia Skeeter, MD [facility]  09/03/2024, 8:43 AM   *Please note that this is a verbal dictation therefore any spelling or grammatical errors are due to the Dragon Medical One system interpretation.  Please page via Amion and do not message via secure chat for urgent patient care matters. Secure chat can be used for non urgent patient care matters.  How to contact the [facility] Attending or Consulting provider 7A - 7P or covering provider during after hours 7P -7A, for this patient?  Check the care team in [facility] and look for a) attending/consulting [facility] provider listed and b) the [facility] team listed. Page or secure chat 7A-7P. Log into [email or web address] and use [facility] universal password to access. If you do not have the password, please contact the hospital operator. Locate the [facility] provider you  are looking for under [facility] and page to a number that you can be directly reached. If you still have difficulty reaching the provider, please page the [facility] (Director on Call) for the Hospitalists listed on amion for assistance.

## 2024-09-03 NOTE — Progress Notes (Signed)
 Pharmacy Progress Note  This patient is receiving the medication famotidine  by the intravenous route. Based on criteria approved by the Pharmacy and Therapeutics Committee, the medication  is/are being converted to equivalent oral dose form(s).  Maurilio Patten, PharmD PGY1 Pharmacy Resident [facility] 09/03/2024 3:43 PM

## 2024-09-03 NOTE — Care Management Obs Status (Signed)
 MEDICARE OBSERVATION STATUS NOTIFICATION   Patient Details  Name: Matthew Roman MRN: [id number] Date of Birth: 1956-04-22   Medicare Observation Status Notification Given:  Yes    Nyajah Hyson G., RN 09/03/2024, 9:08 AM

## 2024-09-03 NOTE — Evaluation (Signed)
 Occupational Therapy Evaluation Patient Details Name: Matthew Roman MRN: [id number] DOB: 1955/09/28 Today's Date: 09/03/2024   History of Present Illness   68 y/o M presenting to ED On 12/12 with aphasia and L neglect, code stroke activated. 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 Pt with impaired cognition, word finding difficulties, and fine coordination deficits in LUE. Pt oriented to self only, needs up to mod A for ADLs, and min A for transfers/room-distance ambulation without AD. Pt presenting with impairments listed below, will follow acutely. Patient will benefit from intensive inpatient follow-up therapy, >3 hours/day.      If plan is discharge home, recommend the following:   A little help with walking and/or transfers;A lot of help with bathing/dressing/bathroom;Assistance with cooking/housework;Direct supervision/assist for financial management;Direct supervision/assist for medications management;Assist for transportation;Supervision due to cognitive status;Help with stairs or ramp for entrance     Functional Status Assessment   Patient has had a recent decline in their functional status and demonstrates the ability to make significant improvements in function in a reasonable and predictable amount of time.     Equipment Recommendations   Other (comment) (defer)     Recommendations for Other Services   PT consult;Rehab consult     Precautions/Restrictions   Restrictions Weight Bearing Restrictions Per Provider Order: No     Mobility Bed Mobility               General bed mobility comments: OOB upon arrival    Transfers Overall transfer level: Needs assistance Equipment used:  None Transfers: Sit to/from Stand Sit to Stand: Min assist                  Balance Overall balance assessment: Needs assistance Sitting-balance support: Feet supported Sitting balance-Leahy Scale: Good     Standing balance support: During functional activity Standing balance-Leahy Scale: Fair Standing balance comment: static standing, not challenged                           ADL either performed or assessed with clinical judgement   ADL Overall ADL's : Needs assistance/impaired Eating/Feeding: Set up   Grooming: Minimal assistance;Standing   Upper Body Bathing: Moderate assistance;Sitting;Standing   Lower Body Bathing: Minimal assistance;Sit to/from stand;Sitting/lateral leans   Upper Body Dressing : Minimal assistance;Standing;Sitting   Lower Body Dressing: Sit to/from stand;Sitting/lateral leans;Moderate assistance   Toilet Transfer: Contact guard Designer, Fashion/clothing- Clothing Manipulation and Hygiene: Contact guard assist       Functional mobility during ADLs: Contact guard assist       Vision Baseline Vision/History: 1 Wears glasses Vision Assessment?: Vision impaired- to be further tested in functional context Additional Comments: decr tracking to R side of page during letter cancellation task, able to track in all directions during finger to nose test     Perception         Praxis         Pertinent Vitals/Pain Pain Assessment Pain Assessment: No/denies pain     Extremity/Trunk Assessment Upper Extremity Assessment Upper Extremity Assessment: Generalized weakness;Right hand dominant;LUE deficits/detail LUE Deficits / Details: decr [facility] LUE Coordination: decreased fine motor   Lower Extremity Assessment Lower Extremity Assessment: Defer  to PT evaluation   Cervical / Trunk Assessment Cervical / Trunk Assessment: Normal   Communication Communication Communication: Impaired Factors Affecting  Communication: Difficulty expressing self;Reduced clarity of speech;Other (comment) (wordfinding difficulties)   Cognition Arousal: Alert Behavior During Therapy: Flat affect Cognition: Cognition impaired   Orientation impairments: Situation, Time, Place (states he is here for a fall) Awareness: Intellectual awareness impaired, Online awareness impaired Memory impairment (select all impairments): Short-term memory, Working memory, Engineer, structural memory Attention impairment (select first level of impairment): Alternating attention Executive functioning impairment (select all impairments): Problem solving, Reasoning, Sequencing, Organization, Initiation OT - Cognition Comments: pt unable to write/identify numbers needed on clock for clock draw, does not recognize therapist after being out of room for ~10 min                 Following commands: Impaired Following commands impaired: Follows one step commands inconsistently, Follows one step commands with increased time     Cueing  General Comments   Cueing Techniques: Verbal cues;Gestural cues  VSS   Exercises     Shoulder Instructions      Home Living Family/patient expects to be discharged to:: Private residence Living Arrangements: Spouse/significant other;Alone (pt not reliable historian, reports living with spouse, then reports living alone)   Type of Home: House Home Access: Level entry     Home Layout: One level     Bathroom Shower/Tub: Walk-in shower             Additional Comments: information different compred to info taken from prior admission      Prior Functioning/Environment Prior Level of Function : Driving             Mobility Comments: no AD use ADLs Comments: reports ind    OT Problem List: Decreased strength;Decreased range of motion;Decreased activity tolerance;Impaired balance (sitting and/or standing);Decreased cognition;Decreased safety awareness;Impaired  vision/perception;Decreased knowledge of precautions;Decreased knowledge of use of DME or AE   OT Treatment/Interventions: Self-care/ADL training;Therapeutic exercise;Energy conservation;DME and/or AE instruction;Neuromuscular education;Therapeutic activities;Cognitive remediation/compensation;Visual/perceptual remediation/compensation;Patient/family education;Balance training      OT Goals(Current goals can be found in the care plan section)   Acute Rehab OT Goals Patient Stated Goal: none stated OT Goal Formulation: With patient Time For Goal Achievement: 09/17/24 Potential to Achieve Goals: Good ADL Goals Pt Will Perform Grooming: Independently;standing Pt Will Perform Upper Body Dressing: Independently;sitting Pt Will Perform Lower Body Dressing: Independently;sit to/from stand;sitting/lateral leans Pt Will Transfer to Toilet: Independently;ambulating;regular height toilet Additional ADL Goal #1: pt will follow 2 step command with min cues in prep for ADLs   OT Frequency:  Min 2X/week    Co-evaluation              AM-PAC OT 6 Clicks Daily Activity     Outcome Measure Help from another person eating meals?: A Little Help from another person taking care of personal grooming?: A Little Help from another person toileting, which includes using toliet, bedpan, or urinal?: A Little Help from another person bathing (including washing, rinsing, drying)?: A Lot Help from another person to put on and taking off regular upper body clothing?: A Little Help from another person to put on and taking off regular lower body clothing?: A Lot 6 Click Score: 16   End of Session Nurse Communication: Mobility status  Activity Tolerance: Patient tolerated treatment well Patient left: in bed;with call bell/phone within reach  OT Visit Diagnosis: Unsteadiness on feet (R26.81);Other abnormalities of gait and mobility (R26.89);Muscle weakness (generalized) (M62.81);Other symptoms and  signs  involving cognitive function;Cognitive communication deficit (R41.841)                Time: 1350-1409 OT Time Calculation (min): 19 min Charges:  OT General Charges $OT Visit: 1 Visit OT Evaluation $OT Eval Moderate Complexity: 1 Mod  Diksha Tagliaferro K, OTD, OTR/L [employer] Preferred Acute Rehab [phone]   Asriel Westrup K Koonce 09/03/2024, 2:28 PM

## 2024-09-03 NOTE — Progress Notes (Signed)
 Home caregiver is Grayce Kurk [phone]. She states that she would like to talk with someone about SNF placement for patient.

## 2024-09-03 NOTE — Evaluation (Signed)
 Physical Therapy Evaluation Patient Details Name: Matthew Roman MRN: [id number] DOB: March 27, 1956 Today's Date: 09/03/2024  History of Present Illness  68 y/o M presenting to ED On 12/12 with aphasia and L neglect, code stroke activated. MRI with small punctate areas of restricted diffusion in R cerebral hemisphere.    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  Clinical Impression  Pt admitted with above diagnosis. Limited history, pt very confused but pleasant and agreeable to therapy evaluation. Required up to min assist for balance while ambulating without assistive device. Difficulty following instructions at times. Patient will benefit from intensive inpatient follow-up therapy, >3 hours/day.  Pt currently with functional limitations due to the deficits listed below (see PT Problem List). Pt will benefit from acute skilled PT to increase their independence and safety with mobility to allow discharge.           If plan is discharge home, recommend the following: A little help with walking and/or transfers;A little help with bathing/dressing/bathroom;Assistance with cooking/housework;Direct supervision/assist for medications management;Direct supervision/assist for financial management;Assist for transportation;Help with stairs or ramp for entrance;Supervision due to cognitive status   Can travel by private vehicle        Equipment Recommendations None recommended by PT  Recommendations for Other Services  Rehab consult    Functional Status Assessment Patient has had a recent decline in their functional status and demonstrates the ability to make significant improvements in function in a reasonable and predictable amount of time.     Precautions / Restrictions Precautions Precautions: Fall Recall of Precautions/Restrictions: Impaired Restrictions Weight Bearing Restrictions Per Provider Order: No       Mobility  Bed Mobility               General bed mobility comments: OOB upon arrival    Transfers Overall transfer level: Needs assistance Equipment used: None Transfers: Sit to/from Stand Sit to Stand: Contact guard assist           General transfer comment: CGA for safety, cues for awareness. Performed from recliner.    Ambulation/Gait Ambulation/Gait assistance: Min assist Gait Distance (Feet): 160 Feet Assistive device: None Gait Pattern/deviations: Step-through pattern, Decreased stance time - left, Decreased stride length, Staggering left, Scissoring Gait velocity: dec Gait velocity interpretation: <1.31 ft/sec, indicative of household ambulator   General Gait Details: Up to min assist for LOB on a few occasions. Has difficulty following commands and sustaining directions for dynamic challenges. Noted intermittent scissoring, staggering towards Lt and needed assist to correct.Cues for awareness..  Stairs            Wheelchair Mobility     Tilt Bed    Modified Rankin (Stroke Patients Only)       Balance Overall balance assessment: Needs assistance Sitting-balance support: Feet supported Sitting balance-Leahy Scale: Good     Standing balance support: During functional activity, No upper extremity supported Standing balance-Leahy Scale: Fair Standing balance comment: static standing                 Standardized Balance Assessment Standardized Balance Assessment : Dynamic Gait Index   Dynamic Gait Index Level Surface: Moderate Impairment Change in Gait Speed: Moderate Impairment Gait with Horizontal Head Turns: Moderate Impairment Gait with Vertical Head Turns: Moderate Impairment Gait and Pivot Turn: Moderate Impairment Step Over Obstacle: Moderate Impairment Step Around Obstacles: Moderate Impairment Steps: Moderate Impairment Total Score: 8       Pertinent  Vitals/Pain Pain Assessment Pain Assessment: No/denies pain     Home Living Family/patient expects to be discharged to:: Private residence Living Arrangements: Spouse/significant other;Alone (pt not reliable historian, reports living with spouse, then reports living alone)   Type of Home: House Home Access: Level entry       Home Layout: One level   Additional Comments: information different compared to info taken from prior admission    Prior Function Prior Level of Function : Driving             Mobility Comments: no AD use ADLs Comments: reports ind     Extremity/Trunk Assessment   Upper Extremity Assessment Upper Extremity Assessment: Defer to OT evaluation LUE Deficits / Details: decr [facility] LUE Coordination: decreased fine motor    Lower Extremity Assessment Lower Extremity Assessment: Difficult to assess due to impaired cognition;LLE deficits/detail LLE Deficits / Details: Appears to have mild LLE weakness when fully weightbearing.    Cervical / Trunk Assessment Cervical / Trunk Assessment: Normal  Communication   Communication Communication: Impaired Factors Affecting Communication: Difficulty expressing self;Reduced clarity of speech;Other (comment) (wordfinding difficulties)    Cognition Arousal: Alert Behavior During Therapy: Flat affect   PT - Cognitive impairments: No family/caregiver present to determine baseline, Orientation, Awareness, Memory, Attention, Initiation, Sequencing, Problem solving, Safety/Judgement   Orientation impairments: Place, Time, Situation                   PT - Cognition Comments: Unsure of month, year, location, sitution. Recalls birthday and month only. Following commands: Impaired Following commands impaired: Follows one step commands inconsistently, Follows one step commands with increased time     Cueing Cueing Techniques: Verbal cues, Gestural cues     General Comments General comments (skin integrity, edema, etc.): VSS    Exercises     Assessment/Plan    PT  Assessment Patient needs continued PT services  PT Problem List Decreased activity tolerance;Decreased balance;Decreased mobility;Decreased coordination;Decreased cognition;Decreased knowledge of use of DME;Decreased safety awareness;Decreased knowledge of precautions       PT Treatment Interventions DME instruction;Gait training;Stair training;Functional mobility training;Therapeutic activities;Balance training;Therapeutic exercise;Neuromuscular re-education;Cognitive remediation;Patient/family education    PT Goals (Current goals can be found in the Care Plan section)  Acute Rehab PT Goals Patient Stated Goal: Go home PT Goal Formulation: Patient unable to participate in goal setting Time For Goal Achievement: 09/17/24 Potential to Achieve Goals: Good Additional Goals Additional Goal #1: Improve DGI >7 points to show clinically significant improvement in balance.    Frequency Min 2X/week     Co-evaluation               AM-PAC PT 6 Clicks Mobility  Outcome Measure Help needed turning from your back to your side while in a flat bed without using bedrails?: None Help needed moving from lying on your back to sitting on the side of a flat bed without using bedrails?: None Help needed moving to and from a bed to a chair (including a wheelchair)?: A Little Help needed standing up from a chair using your arms (e.g., wheelchair or bedside chair)?: A Little Help needed to walk in hospital room?: A Little Help needed climbing 3-5 steps with a railing? : A Little 6 Click Score: 20    End of Session Equipment Utilized During Treatment: Gait belt Activity Tolerance: Patient tolerated treatment well Patient left: in chair;with call bell/phone within reach;with chair alarm set Nurse Communication: Mobility status PT Visit Diagnosis: Unsteadiness on feet (R26.81);Other abnormalities of  gait and mobility (R26.89);Difficulty in walking, not elsewhere classified (R26.2);Other symptoms and  signs involving the nervous system (R29.898)    Time: 8590-8574 PT Time Calculation (min) (ACUTE ONLY): 16 min   Charges:   PT Evaluation $PT Eval Low Complexity: 1 Low   PT General Charges $$ ACUTE PT VISIT: 1 Visit         Leontine Roads, PT, DPT [facility]  Rehabilitation Services Physical Therapist Office: [phone] Website: [email or web address]   Leontine GORMAN Roads 09/03/2024, 3:26 PM

## 2024-09-03 NOTE — Progress Notes (Addendum)
 STROKE TEAM PROGRESS NOTE    INTERIM HISTORY/SUBJECTIVE Seen in room, neurologically stable. Poor memory and poor historian. No strength or sensory deficit.  States he lives with his mother  MRI shows tiny punctate left frontal white matter lacunar infarct.  CT angiogram shows no large vessel stenosis or occlusion. OBJECTIVE  CBC    Component Value Date/Time   WBC 4.6 09/02/2024 1438   RBC 4.62 09/02/2024 1438   HGB 13.6 09/02/2024 1441   HCT 40.0 09/02/2024 1441   PLT 95 (L) 09/02/2024 1438   MCV 90.9 09/02/2024 1438   MCH 28.8 09/02/2024 1438   MCHC 31.7 09/02/2024 1438   RDW 14.0 09/02/2024 1438   LYMPHSABS 0.8 09/02/2024 1438   MONOABS 0.4 09/02/2024 1438   EOSABS 0.1 09/02/2024 1438   BASOSABS 0.1 09/02/2024 1438    BMET    Component Value Date/Time   NA 141 09/02/2024 1441   K 4.0 09/02/2024 1441   CL 101 09/02/2024 1441   CO2 24 09/02/2024 1438   GLUCOSE 131 (H) 09/02/2024 1441   BUN 5 (L) 09/02/2024 1441   CREATININE 0.90 09/02/2024 1441   CALCIUM  8.6 (L) 09/02/2024 1438   GFRNONAA >60 09/02/2024 1438    IMAGING past 24 hours DG Chest Portable 1 View Result Date: 09/02/2024 EXAM: 1 VIEW XRAY OF THE CHEST 09/02/2024 08:14:16 PM COMPARISON: 10/12/2023 CLINICAL HISTORY: ams FINDINGS: LUNGS AND PLEURA: No focal pulmonary opacity. No pleural effusion. No pneumothorax. HEART AND MEDIASTINUM: Aortic atherosclerosis. BONES AND SOFT TISSUES: No acute osseous abnormality. IMPRESSION: 1. No acute cardiopulmonary abnormality. Electronically signed by: Oneil Devonshire MD 09/02/2024 08:25 PM EST RP Workstation: [id number]   MR BRAIN WO CONTRAST Result Date: 09/02/2024 EXAM: MRI BRAIN WITHOUT CONTRAST 09/02/2024 03:35:31 PM TECHNIQUE: Multiplanar multisequence MRI of the head/brain was performed without the administration of intravenous contrast. COMPARISON: MRI brain 03/07/2022. CLINICAL HISTORY: Neuro deficit, acute, stroke suspected. FINDINGS: BRAIN AND VENTRICLES: There is a  small focus of restricted diffusion within the posterior right frontal lobe subcortical white matter with associated T2/FLAIR hyperintensity. No acute intracranial hemorrhage. No midline shift or mass effect. Overall similar severe and confluent subcortical and periventricular T2 and FLAIR signal hyperintensity, likely reflecting sequelae of chronic microvascular ischemia. There is also T2/FLAIR hyperintensity within the pons likely on the basis of chronic microvascular ischemia. Overall similar mild generalized parenchymal volume loss advanced for the patient's stated age. Redemonstrated ex-vacuo dilatation of the left lateral ventricle. Redemonstrated bilateral basal ganglia, thalamic, and left corona radiata chronic infarctions. Redemonstrated foci of susceptibility within the right frontal lobe white matter and right thalamus which may relate to the sequela of remote microhemorrhage. Wallerian degeneration within the left cerebral peduncle. The sellar/suprasellar regions appear unremarkable. Exam is degraded by motion artifact. ORBITS: No acute abnormality. SINUSES AND MASTOIDS: Retention cyst left sphenoid sinus. Mucosal thickening right maxillary sinus. BONES AND SOFT TISSUES: Normal marrow signal. No acute soft tissue abnormality. IMPRESSION: 1. Small recent (acute or early subacute) infarction involving the posterior right frontal lobe subcortical white matter. 2. No acute intracranial hemorrhage. 3. No substantial change of additional chronic findings since 03/07/2022. 4. Exam is degraded by motion artifact. Electronically signed by: Prentice Spade 09/02/2024 04:38 PM EST RP Workstation: [id number]   CT ANGIO HEAD NECK W WO CM (CODE STROKE) Result Date: 09/02/2024 EXAM: CTA Head and Neck with Intravenous Contrast. CLINICAL HISTORY: Stroke, follow up. TECHNIQUE: Axial CTA images of the head and neck performed with intravenous contrast. MIP reconstructed images were created and reviewed. Note: Per  PQRS,  the description of internal carotid artery percent stenosis, including 0 percent or normal exam, is based on [employer] Symptomatic Carotid Endarterectomy Trial (NASCET) criteria. Dose reduction technique was used including one or more of the following: automated exposure control, adjustment of mA and kV according to patient size, and/or iterative reconstruction. CONTRAST: With and without. COMPARISON: CTA head and neck 03/06/2022. FINDINGS: CTA NECK: COMMON CAROTID ARTERIES: No significant stenosis. No dissection or occlusion. INTERNAL CAROTID ARTERIES: No stenosis by NASCET criteria. There is mild stenosis of the bilateral paraclival internal carotid arteries. No dissection or occlusion. VERTEBRAL ARTERIES: The left intracranial vertebral artery primarily terminates as the posterior inferior cerebellar artery (PICA) with a faint distal segment traversing towards the basilar artery, similar to prior exam. No significant stenosis. No dissection or occlusion. CTA HEAD: ANTERIOR CEREBRAL ARTERIES: No significant stenosis. No occlusion. No aneurysm. MIDDLE CEREBRAL ARTERIES: No significant stenosis. No occlusion. No aneurysm. POSTERIOR CEREBRAL ARTERIES: There is multifocal moderate-to-severe stenosis of the left P3 segment, increased since prior exam (series 12, image 21). No occlusion. No aneurysm. BASILAR ARTERY: No significant stenosis. No occlusion. No aneurysm. OTHER: There is a left posterior communicating artery. SOFT TISSUES: No acute finding. No masses or lymphadenopathy. BONES: No acute osseous abnormality. LUNGS: Bilateral upper lung centrilobular pulmonary emphysema. IMPRESSION: 1. No large vessel occlusion or hemodynamically significant stenosis of the major intracranial or cervical arteries. This finding was communicated to Dr. Voncile via the [facility] text paging service at 3:06 pm. 2. Since 03/06/2022, increased multifocal moderate-to-severe stenosis of the left PCA P3 segment. Electronically signed by:  Prentice Spade 09/02/2024 04:02 PM EST RP Workstation: [id number]   CT HEAD CODE STROKE WO CONTRAST Result Date: 09/02/2024 EXAM: CT HEAD WITHOUT CONTRAST 09/02/2024 02:42:16 PM TECHNIQUE: CT of the head was performed without the administration of intravenous contrast. Automated exposure control, iterative reconstruction, and/or weight based adjustment of the mA/kV was utilized to reduce the radiation dose to as low as reasonably achievable. COMPARISON: CT head 10/12/2023 and MRI brain 03/07/2022. CLINICAL HISTORY: Neuro deficit, acute, stroke suspected. FINDINGS: BRAIN AND VENTRICLES: No acute hemorrhage. No evidence of acute territorial infarct. No extra-axial collection. No mass effect or midline shift. There is overall similar mild parenchymal volume loss advanced for the patient's stated age. Overall similar confluent and severe periventricular scattered white matter hypodensities which are nonspecific but most commonly represent chronic microvascular ischemic changes. Re-demonstrated chronic left gangliocapsular infarction, chronic right basal ganglia lacunar infarctions, and chronic infarction in the left corona radiata. Ex-vacuo dilatation of the left lateral ventricle, again noted. ASPECTS 10. ORBITS: No acute abnormality. SINUSES: Mucosal thickening right maxillary sinus. Retention cyst left sphenoid sinus. SOFT TISSUES AND SKULL: No acute soft tissue abnormality. No skull fracture. Edentulous. IMPRESSION: 1. No acute intracranial hemorrhage or acute territorial infarction. This finding was communicated via the [facility] text paging service to Dr. Voncile at 3:05 pm. 2. ASPECTS 10. 3. No substantial change in additional chronic findings since 10/12/2023. Electronically signed by: Prentice Spade 09/02/2024 03:24 PM EST RP Workstation: [id number]    Vitals:   09/03/24 0015 09/03/24 0200 09/03/24 0600 09/03/24 0754  BP:  138/87 (!) 153/86 (!) 157/82  Pulse: 69 75 66 70  Resp: (!) 23 (!) 22 (!) 22 20   Temp:  98.4 F (36.9 C) 98.4 F (36.9 C) 98.1 F (36.7 C)  TempSrc:  Oral Oral Oral  SpO2: 97% 98% 100% 98%  Weight:         PHYSICAL EXAM General:  Alert, well-nourished,  well-developed patient in no acute distress Psych:  Mood and affect appropriate for situation CV: Regular rate and rhythm on monitor Respiratory:  Regular, unlabored respirations on room air GI: Abdomen soft and nontender   NEURO:  Mental Status: Awake and alert to name. Unable to state location, time, date, or age.  Speech/Language: speech is without dysarthria or aphasia.  Naming, repetition, fluency, and comprehension intact. Poor insight into his condition. Cranial Nerves:  II: PERRL. Visual fields full.  III, IV, VI: EOMI. Eyelids elevate symmetrically.  V: Sensation is intact to light touch and symmetrical to face.  VII: Face is symmetrical resting and smiling VIII: hearing intact to voice. IX, X: Palate elevates symmetrically. Phonation is normal.  KP:Dynloizm shrug 5/5. XII: tongue is midline without fasciculations. Motor: 5/5 strength to all muscle groups tested.  Tone: is normal and bulk is normal Sensation- Intact to light touch bilaterally. Extinction absent to light touch to DSS.   Coordination: FTN intact bilaterally, HKS: no ataxia in BLE.No drift.  Gait- deferred  Most Recent NIH 2     ASSESSMENT/PLAN  Matthew Roman is a 68 y.o. male with history of old stroke, ensuing dementia, multiple TIAs, cirrhosis, diabetes, hyperlipidemia, prior history of hepatic encephalopathy, tobacco use, sleep apnea, who lives with a caretaker, brought in for evaluation of sudden onset of confusion and left-sided neglect on EMS exam.    Acute Ischemic Infarct: Sub-cortical right frontal infarct  Etiology:  small vessel disease   Code Stroke CT head No acute abnormality. ASPECTS 10.    CTA head & neck increased multifocal moderate-to-severe stenosis of the left PCA P3 segment. MRI  Small punctate  areas of restricted diffusion in the right cerebral hemisphere, with ADC correlate and also with FLAIR correlate suggestive of early subacute stroke  2D Echo pending  LDL 80 HgbA1c 5.5 VTE prophylaxis - SCDs aspirin  81 mg daily and plavix  prior to admission, now on aspirin  81 mg daily and clopidogrel  75 mg daily for 3 weeks and then plavix  alone. Unclear compliance with medications Therapy recommendations:  Pending Disposition:  pending   Hx of stroke Dec 2019 - multiple small subcortical infarcts with multiple remote age lacunar infarcts. Poorly controlled risk factors. DAPT. Residual aphasia  June 2023 - Possible DWI negative left MCA infarct secondary to intracranial atherosclerosis versus cardioembolic. Recommended to be on DAPT and then plavix  alone   Hypertension Home meds:  Norvasc  Stable Blood Pressure Goal: BP less than 220/110   Hyperlipidemia Home meds:  Atorvastatin , resumed in hospital LDL 80, goal < 70 Continue statin at discharge  Diabetes type II Controlled Home meds:  metformin  HgbA1c 5.5, goal < 7.0 CBGs SSI Recommend close follow-up with PCP for better DM control  Cirrhosis AST/ALT well controlled Continue home medications   Hospital day # 0  Patient seen and examined by NP/APP with MD. MD to update note as needed.   Jorene Last, DNP, FNP-BC [facility] Pager: [phone]  I have personally obtained history,examined this patient, reviewed notes, independently viewed imaging studies, participated in medical decision making and plan of care.ROS completed by me personally and pertinent positives fully documented  I have made any additions or clarifications directly to the above note. Agree with note above.  Patient presented with sudden onset of confusion and left-sided neglect with MRI scan showing tiny punctate right frontal subcortical lacunar infarct likely from small vessel disease.  Patient has cognitive impairment at baseline and not  sure if he was compliant with his  medications.  Recommend aspirin  and Plavix  for 3 weeks followed by Plavix  alone and aggressive risk factor modification.  Complete ongoing stroke workup.  No family at the bedside.  Discussed with Dr.Pahwani and answered questions   I personally spent a total of 50 minutes in the care of the patient today including getting/reviewing separately obtained history, performing a medically appropriate exam/evaluation, counseling and educating, placing orders, referring and communicating with other health care professionals, documenting clinical information in the EHR, independently interpreting results, and coordinating care.         Eather Popp, MD Medical Director [facility] Pager: [phone] 09/03/2024 1:33 PM   To contact Stroke Continuity provider, please refer to [email or web address]. After hours, contact General Neurology

## 2024-09-04 ENCOUNTER — Other Ambulatory Visit: Payer: Self-pay

## 2024-09-04 DIAGNOSIS — R4182 Altered mental status, unspecified: Secondary | ICD-10-CM

## 2024-09-04 LAB — CBC WITH DIFFERENTIAL/PLATELET
Abs Immature Granulocytes: 0.01 K/uL (ref 0.00–0.07)
Basophils Absolute: 0 K/uL (ref 0.0–0.1)
Basophils Relative: 1 %
Eosinophils Absolute: 0.1 K/uL (ref 0.0–0.5)
Eosinophils Relative: 3 %
HCT: 37.8 % — ABNORMAL LOW (ref 39.0–52.0)
Hemoglobin: 12.8 g/dL — ABNORMAL LOW (ref 13.0–17.0)
Immature Granulocytes: 0 %
Lymphocytes Relative: 22 %
Lymphs Abs: 0.8 K/uL (ref 0.7–4.0)
MCH: 29.2 pg (ref 26.0–34.0)
MCHC: 33.9 g/dL (ref 30.0–36.0)
MCV: 86.3 fL (ref 80.0–100.0)
Monocytes Absolute: 0.3 K/uL (ref 0.1–1.0)
Monocytes Relative: 8 %
Neutro Abs: 2.4 K/uL (ref 1.7–7.7)
Neutrophils Relative %: 66 %
Platelets: 79 K/uL — ABNORMAL LOW (ref 150–400)
RBC: 4.38 MIL/uL (ref 4.22–5.81)
RDW: 14.2 % (ref 11.5–15.5)
WBC: 3.6 K/uL — ABNORMAL LOW (ref 4.0–10.5)
nRBC: 0 % (ref 0.0–0.2)

## 2024-09-04 LAB — GLUCOSE, CAPILLARY
Glucose-Capillary: 100 mg/dL — ABNORMAL HIGH (ref 70–99)
Glucose-Capillary: 118 mg/dL — ABNORMAL HIGH (ref 70–99)
Glucose-Capillary: 145 mg/dL — ABNORMAL HIGH (ref 70–99)
Glucose-Capillary: 173 mg/dL — ABNORMAL HIGH (ref 70–99)
Glucose-Capillary: 75 mg/dL (ref 70–99)

## 2024-09-04 MED ORDER — ENSURE PLUS HIGH PROTEIN PO LIQD
237.0000 mL | Freq: Two times a day (BID) | ORAL | Status: DC
  Administered 2024-09-05 – 2024-09-09 (×9): 237 mL via ORAL

## 2024-09-04 NOTE — Progress Notes (Signed)
 Inpatient Rehab Admissions:  Inpatient Rehab Consult received.  I spoke with pt's caregiver Grayce on the telephone for rehabilitation assessment and to discuss goals and expectations of an inpatient rehab admission.  Discussed average length of stay, insurance authorization requirement and discharge home after completion of CIR. Grayce acknowledged understanding. Grayce would prefer SNF placement for pt d/t inability to be able to provide 24/7 support for pt after discharge. TOC made aware.  Signed: Tinnie Yvone Cohens, MS, CCC-SLP Admissions Coordinator [phone]

## 2024-09-04 NOTE — Progress Notes (Signed)
 Initial Nutrition Assessment  INTERVENTION:   -Ensure Plus High Protein po BID, each supplement provides 350 kcal and 20 grams of protein.   NUTRITION DIAGNOSIS:   Increased nutrient needs related to acute illness as evidenced by estimated needs.  GOAL:   Patient will meet greater than or equal to 90% of their needs   MONITOR:   PO intake, Supplement acceptance  REASON FOR ASSESSMENT:   Consult Assessment of nutrition requirement/status  ASSESSMENT:   68 y.o. male with hx of old stroke, ensuing dementia, multiple TIAs, cirrhosis, diabetes, hyperlipidemia, prior history of hepatic encephalopathy, tobacco use, sleep apnea, who lives with a caretaker, brought in for evaluation of sudden onset of confusion and left-sided neglect. Admitted for acute ischemic stroke.  Patient currently consuming 75% of meals. Alert/oriented x 2. Given continued confusion, will order Ensure supplements.  Awaiting d/c to SNF.  Per weight records, no significant weight changes recently. Current weight: 176 lbs  Medications: Pepcid , insulin   Labs reviewed: CBGs: 100-145    NUTRITION - FOCUSED PHYSICAL EXAM:  Unable to complete, working remotely.  Diet Order:   Diet Order             Diet Heart Room service appropriate? Yes; Fluid consistency: Thin  Diet effective now                   EDUCATION NEEDS:   No education needs have been identified at this time  Skin:  Skin Assessment: Reviewed RN Assessment  Last BM:  12/13  Height:   Ht Readings from Last 1 Encounters:  09/04/24 6' (1.829 m)    Weight:   Wt Readings from Last 1 Encounters:  09/02/24 80.2 kg    BMI:  Body mass index is 23.98 kg/m.  Estimated Nutritional Needs:   Kcal:  1800-2000  Protein:  90-100g  Fluid:  2L/day  Morna Lee, MS, RD, LDN Inpatient Clinical Dietitian Contact via Secure chat

## 2024-09-04 NOTE — Progress Notes (Signed)
 STROKE TEAM PROGRESS NOTE    INTERIM HISTORY/SUBJECTIVE Seen in room, neurologically stable.  Patient is sitting and eating his breakfast.  He remains confused and disoriented with poor insight into his condition. Echocardiogram showed ejection fraction 6065%.  Left atrial size is normal.  EEG shows diffuse slowing no seizures.  OBJECTIVE  CBC    Component Value Date/Time   WBC 3.6 (L) 09/04/2024 0554   RBC 4.38 09/04/2024 0554   HGB 12.8 (L) 09/04/2024 0554   HCT 37.8 (L) 09/04/2024 0554   PLT 79 (L) 09/04/2024 0554   MCV 86.3 09/04/2024 0554   MCH 29.2 09/04/2024 0554   MCHC 33.9 09/04/2024 0554   RDW 14.2 09/04/2024 0554   LYMPHSABS 0.8 09/04/2024 0554   MONOABS 0.3 09/04/2024 0554   EOSABS 0.1 09/04/2024 0554   BASOSABS 0.0 09/04/2024 0554    BMET    Component Value Date/Time   NA 141 09/02/2024 1441   K 4.0 09/02/2024 1441   CL 101 09/02/2024 1441   CO2 24 09/02/2024 1438   GLUCOSE 131 (H) 09/02/2024 1441   BUN 5 (L) 09/02/2024 1441   CREATININE 0.90 09/02/2024 1441   CALCIUM  8.6 (L) 09/02/2024 1438   GFRNONAA >60 09/02/2024 1438    IMAGING past 24 hours EEG adult Result Date: 09/03/2024 Shelton Arlin KIDD, MD     09/03/2024  4:08 PM Patient Name: Matthew Roman MRN: [id number] Epilepsy Attending: Arlin KIDD Shelton Referring Physician/Provider: Tobie Mario GAILS, MD Date: 09/03/2024 Duration: 24.09 mins Patient history: 68yo M with sudden onset of confusion and left-sided neglect. EEG to evaluate for seizure. Level of alertness: Awake AEDs during EEG study: None Technical aspects: This EEG study was done with scalp electrodes positioned according to the 10-20 International system of electrode placement. Electrical activity was reviewed with band pass filter of 1-70Hz , sensitivity of 7 uV/mm, display speed of 62mm/sec with a 60Hz  notched filter applied as appropriate. EEG data were recorded continuously and digitally stored.  Video monitoring was available and reviewed as  appropriate. Description: The posterior dominant rhythm consists of 8 Hz activity of moderate voltage (25-35 uV) seen predominantly in posterior head regions, symmetric and reactive to eye opening and eye closing. There is intermittent generalized 3 to 6 Hz theta-delta slowing. Hyperventilation and photic stimulation were not performed.   ABNORMALITY - Intermittent slow, generalized IMPRESSION: This study is suggestive of generalized cerebral dysfunction (encephalopathy). No seizures or epileptiform discharges were seen throughout the recording. Arlin KIDD Shelton   ECHOCARDIOGRAM COMPLETE Result Date: 09/03/2024    ECHOCARDIOGRAM REPORT   Patient Name:   Matthew Roman Date of Exam: 09/03/2024 Medical Rec #:  [id number]      Height:       72.0 in Accession #:    [id number]     Weight:       176.8 lb Date of Birth:  Jan 04, 1956      BSA:          2.022 m Patient Age:    68 years       BP:           157/82 mmHg Patient Gender: M              HR:           73 bpm. Exam Location:  Inpatient Procedure: 2D Echo (Both Spectral and Color Flow Doppler were utilized during            procedure). Indications:    stroke  History:  Patient has prior history of Echocardiogram examinations, most                 recent 03/07/2022. Cirrhosis, TIA, Stroke and COPD,                 Signs/Symptoms:Altered Mental Status; Risk Factors:Diabetes,                 Dyslipidemia, Current Smoker and Sleep Apnea.  Sonographer:    Tinnie Barefoot RDCS Referring Phys: [phone] EKTA V PATEL  Sonographer Comments: Image acquisition challenging due to respiratory motion and Image acquisition challenging due to COPD. IMPRESSIONS  1. Left ventricular ejection fraction, by estimation, is 60 to 65%. Left ventricular ejection fraction by 2D MOD biplane is 61.7 %. The left ventricle has normal function. The left ventricle has no regional wall motion abnormalities. Left ventricular diastolic parameters are consistent with Grade I diastolic dysfunction  (impaired relaxation).  2. Right ventricular systolic function is normal. The right ventricular size is normal. Tricuspid regurgitation signal is inadequate for assessing PA pressure.  3. The mitral valve is grossly normal. No evidence of mitral valve regurgitation.  4. The aortic valve was not well visualized. Aortic valve regurgitation is not visualized. No aortic stenosis is present.  5. The inferior vena cava is normal in size with greater than 50% respiratory variability, suggesting right atrial pressure of 3 mmHg. FINDINGS  Left Ventricle: Left ventricular ejection fraction, by estimation, is 60 to 65%. Left ventricular ejection fraction by 2D MOD biplane is 61.7 %. The left ventricle has normal function. The left ventricle has no regional wall motion abnormalities. The left ventricular internal cavity size was normal in size. There is no left ventricular hypertrophy. Left ventricular diastolic parameters are consistent with Grade I diastolic dysfunction (impaired relaxation). Indeterminate filling pressures. Right Ventricle: The right ventricular size is normal. No increase in right ventricular wall thickness. Right ventricular systolic function is normal. Tricuspid regurgitation signal is inadequate for assessing PA pressure. Left Atrium: Left atrial size was normal in size. Right Atrium: Right atrial size was normal in size. Pericardium: There is no evidence of pericardial effusion. Mitral Valve: The mitral valve is grossly normal. No evidence of mitral valve regurgitation. Tricuspid Valve: The tricuspid valve is normal in structure. Tricuspid valve regurgitation is not demonstrated. Aortic Valve: The aortic valve was not well visualized. Aortic valve regurgitation is not visualized. No aortic stenosis is present. Pulmonic Valve: The pulmonic valve was not well visualized. Pulmonic valve regurgitation is not visualized. Aorta: The aortic root and ascending aorta are structurally normal, with no evidence of  dilitation. Venous: The inferior vena cava is normal in size with greater than 50% respiratory variability, suggesting right atrial pressure of 3 mmHg. IAS/Shunts: No atrial level shunt detected by color flow Doppler.  LEFT VENTRICLE PLAX 2D                        Biplane EF (MOD) LVIDd:         4.40 cm         LV Biplane EF:   Left LVIDs:         3.00 cm                          ventricular LV PW:         1.00 cm  ejection LV IVS:        1.00 cm                          fraction by LVOT diam:     2.20 cm                          2D MOD LV SV:         73                               biplane is LV SV Index:   36                               61.7 %. LVOT Area:     3.80 cm LV IVRT:       106 msec        Diastology                                LV e' medial:  7.62 cm/s                                LV e' lateral: 8.16 cm/s LV Volumes (MOD) LV vol d, MOD    65.3 ml A2C: LV vol d, MOD    50.1 ml A4C: LV vol s, MOD    22.4 ml A2C: LV vol s, MOD    21.5 ml A4C: LV SV MOD A2C:   42.9 ml LV SV MOD A4C:   50.1 ml LV SV MOD BP:    35.1 ml RIGHT VENTRICLE             IVC RV Basal diam:  2.40 cm     IVC diam: 1.40 cm RV S prime:     15.10 cm/s TAPSE (M-mode): 1.8 cm LEFT ATRIUM             Index        RIGHT ATRIUM          Index LA Vol (A2C):   51.3 ml 25.37 ml/m  RA Area:     9.67 cm LA Vol (A4C):   42.7 ml 21.12 ml/m  RA Volume:   21.40 ml 10.58 ml/m LA Biplane Vol: 47.9 ml 23.69 ml/m  AORTIC VALVE LVOT Vmax:   97.00 cm/s LVOT Vmean:  64.900 cm/s LVOT VTI:    0.193 m  AORTA Ao Root diam: 3.60 cm Ao Asc diam:  3.30 cm  SHUNTS Systemic VTI:  0.19 m Systemic Diam: 2.20 cm Vinie Maxcy MD Electronically signed by Vinie Maxcy MD Signature Date/Time: 09/03/2024/12:23:32 PM    Final     Vitals:   09/03/24 1944 09/04/24 0129 09/04/24 0635 09/04/24 0829  BP: (!) 139/105 (!) 139/105 (!) 148/75 (!) 151/77  Pulse: 96 96 (!) 59 62  Resp: 18 18  18   Temp: 98.4 F (36.9 C) 98.4 F (36.9 C) (!)  97.3 F (36.3 C) 97.9 F (36.6 C)  TempSrc:    Oral  SpO2: 98%  95% 99%  Weight:      Height:  6' (1.829 m)       PHYSICAL EXAM General:  Alert, well-nourished, well-developed patient in  no acute distress Psych:  Mood and affect appropriate for situation CV: Regular rate and rhythm on monitor Respiratory:  Regular, unlabored respirations on room air GI: Abdomen soft and nontender   NEURO:  Mental Status: Awake and alert to name. Unable to state location, time, date, or age.  Speech/Language: speech is without dysarthria or aphasia.  Naming, repetition, fluency, and comprehension intact. Poor insight into his condition. Cranial Nerves:  II: PERRL. Visual fields full.  III, IV, VI: EOMI. Eyelids elevate symmetrically.  V: Sensation is intact to light touch and symmetrical to face.  VII: Face is symmetrical resting and smiling VIII: hearing intact to voice. IX, X: Palate elevates symmetrically. Phonation is normal.  KP:Dynloizm shrug 5/5. XII: tongue is midline without fasciculations. Motor: 5/5 strength to all muscle groups tested.  Tone: is normal and bulk is normal Sensation- Intact to light touch bilaterally. Extinction absent to light touch to DSS.   Coordination: FTN intact bilaterally, HKS: no ataxia in BLE.No drift.  Gait- deferred  Most Recent NIH 2     ASSESSMENT/PLAN  [name] is a 68 y.o. male with history of old stroke, ensuing dementia, multiple TIAs, cirrhosis, diabetes, hyperlipidemia, prior history of hepatic encephalopathy, tobacco use, sleep apnea, who lives with a caretaker, brought in for evaluation of sudden onset of confusion and left-sided neglect on EMS exam.    Acute Ischemic Infarct: Sub-cortical right frontal infarct  Etiology:  small vessel disease   Code Stroke CT head No acute abnormality. ASPECTS 10.    CTA head & neck increased multifocal moderate-to-severe stenosis of the left PCA P3 segment. MRI  Small punctate areas of  restricted diffusion in the right cerebral hemisphere, with ADC correlate and also with FLAIR correlate suggestive of early subacute stroke  2D Echo EF 60 to 65%.  LA size normal. LDL 80 HgbA1c 5.5 EEG G moderate diffuse slowing 3 to 6 Hz no seizure activity VTE prophylaxis - SCDs aspirin  81 mg daily and plavix  prior to admission, now on aspirin  81 mg daily and clopidogrel  75 mg daily for 3 weeks and then plavix  alone. Unclear compliance with medications Therapy recommendations:  Pending Disposition:  pending   Hx of stroke Dec 2019 - multiple small subcortical infarcts with multiple remote age lacunar infarcts. Poorly controlled risk factors. DAPT. Residual aphasia  June 2023 - Possible DWI negative left MCA infarct secondary to intracranial atherosclerosis versus cardioembolic. Recommended to be on DAPT and then plavix  alone   Hypertension Home meds:  Norvasc  Stable Blood Pressure Goal: BP less than 220/110   Hyperlipidemia Home meds:  Atorvastatin , resumed in hospital LDL 80, goal < 70 Continue statin at discharge  Diabetes type II Controlled Home meds:  metformin  HgbA1c 5.5, goal < 7.0 CBGs SSI Recommend close follow-up with PCP for better DM control  Cirrhosis AST/ALT well controlled Continue home medications   Hospital day # 0  Patient presented with sudden onset of confusion and left-sided neglect with MRI scan showing tiny punctate right frontal subcortical lacunar infarct likely from small vessel disease.  Patient has cognitive impairment at baseline and not sure if he was compliant with his medications.  Recommend aspirin  and Plavix  for 3 weeks followed by Plavix  alone and aggressive risk factor modification.   No family at the bedside.  Stroke team will sign off.        Eather Popp, MD Medical Director [facility] Pager: [phone] 09/04/2024 11:15 AM   To contact Stroke Continuity provider, please refer  to [email or web address]. After hours, contact  General Neurology

## 2024-09-04 NOTE — Discharge Instructions (Signed)
 Dear Prentice Em,   Congratulations for your interest in quitting smoking!  Find a program that suits you best: when you want to quit, how you need support, where you live, and how you like to learn.    If youre ready to get started TODAY, consider scheduling a visit through [email or web address].  Appointments are available from 8am to 8pm, Monday to Friday.   Most health insurance plans will cover some level of tobacco cessation visits and medications.    Additional Resources: [employer] are also available to help you quit & provide the support youll need. Many programs are available in both English and Spanish and have a long history of successfully helping people get off and stay off tobacco.    Quit Smoking Apps:  quitSTART at seriousbroker.de QuitGuide?at forgetparking.dk Online education and resources: Smokefree  at borders group.gov Free Telephone Coaching: QuitNow,  Call 1-800-QUIT-NOW (9786241429) or Text- Ready to 272-569-4665 *Quitline [employer] has teamed up with Medicaid to offer a free 14 week program    Vaping- Want to Quit? Free 24/7 support. Call [facility]  [place] [place], [place], [place], [place], [place]  [facility]

## 2024-09-04 NOTE — Plan of Care (Signed)
°  Problem: Education: Goal: Knowledge of disease or condition will improve Outcome: Progressing   Problem: Health Behavior/Discharge Planning: Goal: Ability to manage health-related needs will improve Outcome: Progressing   Problem: Coping: Goal: Will verbalize positive feelings about self Outcome: Progressing   Problem: Nutrition: Goal: Dietary intake will improve Outcome: Progressing   Problem: Coping: Goal: Level of anxiety will decrease Outcome: Progressing

## 2024-09-04 NOTE — Progress Notes (Signed)
 PROGRESS NOTE    [name]  FMW:[id number] DOB: 1955/12/11 DOA: 09/02/2024 PCP: [facility]   Brief Narrative:  Matthew Roman is a 68 y.o. male with hx of old stroke, ensuing dementia, multiple TIAs, cirrhosis, diabetes, hyperlipidemia, prior history of hepatic encephalopathy, tobacco use, sleep apnea, who lives with a caretaker, brought in for evaluation of sudden onset of confusion and left-sided neglect on EMS exam Presumably last known well was 11 AM 09/02/2024.  He was having difficulty putting on his coat and doing his day-to-day activities that he is able to do otherwise without a problem.  When EMS was called, they assessed him, he did appear confused, he had some aphasia and also had some left sided neglect for them, leading them to activate a code stroke.  He was admitted under hospital service, neurology consulted.  Details below.  Assessment & Plan:   Principal Problem:   AMS (altered mental status)  Acute metabolic encephalopathy secondary to acute ischemic stroke: Initial CT head negative, CTA head and neck negative for LVO, MRI brain eventually showed small acute or early subacute infarction involving posterior right frontal lobe subcortical white matter.  Reportedly, due to previous history of strokes, patient was supposed to be on DAPT however unsure whether he was taking them.  Regardless, they have been resumed by neurology along with atorvastatin .  I was able to speak to patient's caregiver  Quentin Kurk) who he lives with, according to her, at baseline he does have some confusion and he does not typically know what month or date is.  However when this event happened, he was more confused.  Based on my assessment today, patient is more alert, he knows he is in the [place].  He appears to be at his baseline. Echo ruled out PFO.  Seen by PT OT who recommend CIR.  Pending CIR placement.  Neurology signed off.  Neurology recommended continuing DAPT for 3 weeks  followed by Plavix  alone.  Essential hypertension: PTA medications include amlodipine  which is on hold to allow permissive hypertension for another 24 hours.  Type II obese mellitus, non-insulin -dependent: Patient on metformin  at home which is on hold.  Hemoglobin A1c only 5.5.  Currently on SSI.  Hyperlipidemia: LDL 58, atorvastatin  40 mg resumed for neurology recommendation.  Tobacco abuse: I have discussed tobacco cessation with the patient.  I have counseled the patient regarding the negative impacts of continued tobacco use including but not limited to lung cancer, COPD, and cardiovascular disease.  I have discussed alternatives to tobacco and modalities that may help facilitate tobacco cessation including but not limited to biofeedback, hypnosis, and medications.  Total time spent with tobacco counseling was 5 minutes.  Chronic thrombocytopenia: Stable.  DVT prophylaxis: heparin  injection 5,000 Units Start: 09/03/24 0600   Code Status: Full Code  Family Communication:  None present at bedside.  Discussed with caretaker.  Status is: Observation The patient will require care spanning > 2 midnights and should be moved to inpatient because: Medically stable, pending placement to CIR.  Estimated body mass index is 23.98 kg/m as calculated from the following:   Height as of this encounter: 6' (1.829 m).   Weight as of this encounter: 80.2 kg.    Nutritional Assessment: Body mass index is 23.98 kg/m.[name] Seen by dietician.  I agree with the assessment and plan as outlined below: Nutrition Status:        . Skin Assessment: I have examined the patient's skin and I agree with the wound assessment  as performed by the wound care RN as outlined below:    Consultants:  Neurology  Procedures:  As above  Antimicrobials:  Anti-infectives (From admission, onward)    None         Subjective: Patient seen and examined, he was making his bed when I went to see him.  He had no  complaints.  He was alert and oriented to self and place.  He was able to tell me today that he lives with Grayce.  Objective: Vitals:   09/03/24 1944 09/04/24 0129 09/04/24 0635 09/04/24 0829  BP: (!) 139/105 (!) 139/105 (!) 148/75 (!) 151/77  Pulse: 96 96 (!) 59 62  Resp: 18 18  18   Temp: 98.4 F (36.9 C) 98.4 F (36.9 C) (!) 97.3 F (36.3 C) 97.9 F (36.6 C)  TempSrc:    Oral  SpO2: 98%  95% 99%  Weight:      Height:  6' (1.829 m)      Intake/Output Summary (Last 24 hours) at 09/04/2024 1136 Last data filed at 09/04/2024 0900 Gross per 24 hour  Intake 1168.82 ml  Output --  Net 1168.82 ml   Filed Weights   09/02/24 1440  Weight: 80.2 kg    Examination:  General exam: Appears calm and comfortable  Respiratory system: Clear to auscultation. Respiratory effort normal. Cardiovascular system: S1 & S2 heard, RRR. No JVD, murmurs, rubs, gallops or clicks. No pedal edema. Gastrointestinal system: Abdomen is nondistended, soft and nontender. No organomegaly or masses felt. Normal bowel sounds heard. Central nervous system: Alert and oriented x 2. No focal neurological deficits. Extremities: Symmetric 5 x 5 power. Skin: No rashes, lesions or ulcers.  Psychiatry: Judgement and insight appear poor  Data Reviewed: I have personally reviewed following labs and imaging studies  CBC: Recent Labs  Lab 09/02/24 1438 09/02/24 1441 09/04/24 0554  WBC 4.6  --  3.6*  NEUTROABS 3.3  --  2.4  HGB 13.3 13.6 12.8*  HCT 42.0 40.0 37.8*  MCV 90.9  --  86.3  PLT 95*  --  79*   Basic Metabolic Panel: Recent Labs  Lab 09/02/24 1438 09/02/24 1441 09/02/24 2013  NA 138 141  --   K 4.0 4.0  --   CL 105 101  --   CO2 24  --   --   GLUCOSE 134* 131*  --   BUN 5* 5*  --   CREATININE 0.74 0.90  --   CALCIUM  8.6*  --   --   MG  --   --  1.6*   GFR: Estimated Creatinine Clearance: 85 mL/min (by C-G formula based on SCr of 0.9 mg/dL). Liver Function Tests: Recent Labs  Lab  09/02/24 1438  AST 24  ALT 34  ALKPHOS 96  BILITOT 0.5  PROT 5.9*  ALBUMIN 3.6   No results for input(s): LIPASE, AMYLASE in the last 168 hours. Recent Labs  Lab 09/02/24 1553  AMMONIA 26   Coagulation Profile: Recent Labs  Lab 09/02/24 1438  INR 1.1   Cardiac Enzymes: No results for input(s): CKTOTAL, CKMB, CKMBINDEX, TROPONINI in the last 168 hours. BNP (last 3 results) No results for input(s): PROBNP in the last 8760 hours. HbA1C: Recent Labs    09/02/24 2017  HGBA1C 5.5   CBG: Recent Labs  Lab 09/03/24 1253 09/03/24 1632 09/03/24 2136 09/04/24 0356 09/04/24 0817  GLUCAP 103* 169* 94 100* 118*   Lipid Profile: Recent Labs    09/03/24 0906  CHOL  107  HDL 33*  LDLCALC 58  TRIG 79  CHOLHDL 3.2   Thyroid Function Tests: Recent Labs    09/02/24 2013  TSH 1.004  FREET4 0.81   Anemia Panel: Recent Labs    09/02/24 2013  VITAMINB12 1,221*   Sepsis Labs: Recent Labs  Lab 09/02/24 2013  PROCALCITON <0.10    No results found for this or any previous visit (from the past 240 hours).   Radiology Studies: EEG adult Result Date: 09/03/2024 Shelton Arlin KIDD, MD     09/03/2024  4:08 PM Patient Name: Matthew Roman MRN: [id number] Epilepsy Attending: Arlin KIDD Shelton Referring Physician/Provider: Tobie Mario GAILS, MD Date: 09/03/2024 Duration: 24.09 mins Patient history: 68yo M with sudden onset of confusion and left-sided neglect. EEG to evaluate for seizure. Level of alertness: Awake AEDs during EEG study: None Technical aspects: This EEG study was done with scalp electrodes positioned according to the 10-20 International system of electrode placement. Electrical activity was reviewed with band pass filter of 1-70Hz , sensitivity of 7 uV/mm, display speed of 61mm/sec with a 60Hz  notched filter applied as appropriate. EEG data were recorded continuously and digitally stored.  Video monitoring was available and reviewed as appropriate.  Description: The posterior dominant rhythm consists of 8 Hz activity of moderate voltage (25-35 uV) seen predominantly in posterior head regions, symmetric and reactive to eye opening and eye closing. There is intermittent generalized 3 to 6 Hz theta-delta slowing. Hyperventilation and photic stimulation were not performed.   ABNORMALITY - Intermittent slow, generalized IMPRESSION: This study is suggestive of generalized cerebral dysfunction (encephalopathy). No seizures or epileptiform discharges were seen throughout the recording. Arlin KIDD Shelton   ECHOCARDIOGRAM COMPLETE Result Date: 09/03/2024    ECHOCARDIOGRAM REPORT   Patient Name:   TRYSON LUMLEY Date of Exam: 09/03/2024 Medical Rec #:  [id number]      Height:       72.0 in Accession #:    [id number]     Weight:       176.8 lb Date of Birth:  1956/08/17      BSA:          2.022 m Patient Age:    68 years       BP:           157/82 mmHg Patient Gender: M              HR:           73 bpm. Exam Location:  Inpatient Procedure: 2D Echo (Both Spectral and Color Flow Doppler were utilized during            procedure). Indications:    stroke  History:        Patient has prior history of Echocardiogram examinations, most                 recent 03/07/2022. Cirrhosis, TIA, Stroke and COPD,                 Signs/Symptoms:Altered Mental Status; Risk Factors:Diabetes,                 Dyslipidemia, Current Smoker and Sleep Apnea.  Sonographer:    Tinnie Barefoot RDCS Referring Phys: [phone] EKTA V PATEL  Sonographer Comments: Image acquisition challenging due to respiratory motion and Image acquisition challenging due to COPD. IMPRESSIONS  1. Left ventricular ejection fraction, by estimation, is 60 to 65%. Left ventricular ejection fraction by 2D MOD biplane is 61.7 %. The left ventricle has normal  function. The left ventricle has no regional wall motion abnormalities. Left ventricular diastolic parameters are consistent with Grade I diastolic dysfunction (impaired  relaxation).  2. Right ventricular systolic function is normal. The right ventricular size is normal. Tricuspid regurgitation signal is inadequate for assessing PA pressure.  3. The mitral valve is grossly normal. No evidence of mitral valve regurgitation.  4. The aortic valve was not well visualized. Aortic valve regurgitation is not visualized. No aortic stenosis is present.  5. The inferior vena cava is normal in size with greater than 50% respiratory variability, suggesting right atrial pressure of 3 mmHg. FINDINGS  Left Ventricle: Left ventricular ejection fraction, by estimation, is 60 to 65%. Left ventricular ejection fraction by 2D MOD biplane is 61.7 %. The left ventricle has normal function. The left ventricle has no regional wall motion abnormalities. The left ventricular internal cavity size was normal in size. There is no left ventricular hypertrophy. Left ventricular diastolic parameters are consistent with Grade I diastolic dysfunction (impaired relaxation). Indeterminate filling pressures. Right Ventricle: The right ventricular size is normal. No increase in right ventricular wall thickness. Right ventricular systolic function is normal. Tricuspid regurgitation signal is inadequate for assessing PA pressure. Left Atrium: Left atrial size was normal in size. Right Atrium: Right atrial size was normal in size. Pericardium: There is no evidence of pericardial effusion. Mitral Valve: The mitral valve is grossly normal. No evidence of mitral valve regurgitation. Tricuspid Valve: The tricuspid valve is normal in structure. Tricuspid valve regurgitation is not demonstrated. Aortic Valve: The aortic valve was not well visualized. Aortic valve regurgitation is not visualized. No aortic stenosis is present. Pulmonic Valve: The pulmonic valve was not well visualized. Pulmonic valve regurgitation is not visualized. Aorta: The aortic root and ascending aorta are structurally normal, with no evidence of  dilitation. Venous: The inferior vena cava is normal in size with greater than 50% respiratory variability, suggesting right atrial pressure of 3 mmHg. IAS/Shunts: No atrial level shunt detected by color flow Doppler.  LEFT VENTRICLE PLAX 2D                        Biplane EF (MOD) LVIDd:         4.40 cm         LV Biplane EF:   Left LVIDs:         3.00 cm                          ventricular LV PW:         1.00 cm                          ejection LV IVS:        1.00 cm                          fraction by LVOT diam:     2.20 cm                          2D MOD LV SV:         73                               biplane is LV SV Index:   36  61.7 %. LVOT Area:     3.80 cm LV IVRT:       106 msec        Diastology                                LV e' medial:  7.62 cm/s                                LV e' lateral: 8.16 cm/s LV Volumes (MOD) LV vol d, MOD    65.3 ml A2C: LV vol d, MOD    50.1 ml A4C: LV vol s, MOD    22.4 ml A2C: LV vol s, MOD    21.5 ml A4C: LV SV MOD A2C:   42.9 ml LV SV MOD A4C:   50.1 ml LV SV MOD BP:    35.1 ml RIGHT VENTRICLE             IVC RV Basal diam:  2.40 cm     IVC diam: 1.40 cm RV S prime:     15.10 cm/s TAPSE (M-mode): 1.8 cm LEFT ATRIUM             Index        RIGHT ATRIUM          Index LA Vol (A2C):   51.3 ml 25.37 ml/m  RA Area:     9.67 cm LA Vol (A4C):   42.7 ml 21.12 ml/m  RA Volume:   21.40 ml 10.58 ml/m LA Biplane Vol: 47.9 ml 23.69 ml/m  AORTIC VALVE LVOT Vmax:   97.00 cm/s LVOT Vmean:  64.900 cm/s LVOT VTI:    0.193 m  AORTA Ao Root diam: 3.60 cm Ao Asc diam:  3.30 cm  SHUNTS Systemic VTI:  0.19 m Systemic Diam: 2.20 cm Vinie Maxcy MD Electronically signed by Vinie Maxcy MD Signature Date/Time: 09/03/2024/12:23:32 PM    Final    DG Chest Portable 1 View Result Date: 09/02/2024 EXAM: 1 VIEW XRAY OF THE CHEST 09/02/2024 08:14:16 PM COMPARISON: 10/12/2023 CLINICAL HISTORY: ams FINDINGS: LUNGS AND PLEURA: No focal pulmonary opacity. No  pleural effusion. No pneumothorax. HEART AND MEDIASTINUM: Aortic atherosclerosis. BONES AND SOFT TISSUES: No acute osseous abnormality. IMPRESSION: 1. No acute cardiopulmonary abnormality. Electronically signed by: Oneil Devonshire MD 09/02/2024 08:25 PM EST RP Workstation: [id number]   MR BRAIN WO CONTRAST Result Date: 09/02/2024 EXAM: MRI BRAIN WITHOUT CONTRAST 09/02/2024 03:35:31 PM TECHNIQUE: Multiplanar multisequence MRI of the head/brain was performed without the administration of intravenous contrast. COMPARISON: MRI brain 03/07/2022. CLINICAL HISTORY: Neuro deficit, acute, stroke suspected. FINDINGS: BRAIN AND VENTRICLES: There is a small focus of restricted diffusion within the posterior right frontal lobe subcortical white matter with associated T2/FLAIR hyperintensity. No acute intracranial hemorrhage. No midline shift or mass effect. Overall similar severe and confluent subcortical and periventricular T2 and FLAIR signal hyperintensity, likely reflecting sequelae of chronic microvascular ischemia. There is also T2/FLAIR hyperintensity within the pons likely on the basis of chronic microvascular ischemia. Overall similar mild generalized parenchymal volume loss advanced for the patient's stated age. Redemonstrated ex-vacuo dilatation of the left lateral ventricle. Redemonstrated bilateral basal ganglia, thalamic, and left corona radiata chronic infarctions. Redemonstrated foci of susceptibility within the right frontal lobe white matter and right thalamus which may relate to the sequela of remote microhemorrhage. Wallerian degeneration within the left cerebral peduncle. The sellar/suprasellar  regions appear unremarkable. Exam is degraded by motion artifact. ORBITS: No acute abnormality. SINUSES AND MASTOIDS: Retention cyst left sphenoid sinus. Mucosal thickening right maxillary sinus. BONES AND SOFT TISSUES: Normal marrow signal. No acute soft tissue abnormality. IMPRESSION: 1. Small recent (acute or early  subacute) infarction involving the posterior right frontal lobe subcortical white matter. 2. No acute intracranial hemorrhage. 3. No substantial change of additional chronic findings since 03/07/2022. 4. Exam is degraded by motion artifact. Electronically signed by: Prentice Spade 09/02/2024 04:38 PM EST RP Workstation: [id number]   CT ANGIO HEAD NECK W WO CM (CODE STROKE) Result Date: 09/02/2024 EXAM: CTA Head and Neck with Intravenous Contrast. CLINICAL HISTORY: Stroke, follow up. TECHNIQUE: Axial CTA images of the head and neck performed with intravenous contrast. MIP reconstructed images were created and reviewed. Note: Per PQRS, the description of internal carotid artery percent stenosis, including 0 percent or normal exam, is based on [employer] American Symptomatic Carotid Endarterectomy Trial (NASCET) criteria. Dose reduction technique was used including one or more of the following: automated exposure control, adjustment of mA and kV according to patient size, and/or iterative reconstruction. CONTRAST: With and without. COMPARISON: CTA head and neck 03/06/2022. FINDINGS: CTA NECK: COMMON CAROTID ARTERIES: No significant stenosis. No dissection or occlusion. INTERNAL CAROTID ARTERIES: No stenosis by NASCET criteria. There is mild stenosis of the bilateral paraclival internal carotid arteries. No dissection or occlusion. VERTEBRAL ARTERIES: The left intracranial vertebral artery primarily terminates as the posterior inferior cerebellar artery (PICA) with a faint distal segment traversing towards the basilar artery, similar to prior exam. No significant stenosis. No dissection or occlusion. CTA HEAD: ANTERIOR CEREBRAL ARTERIES: No significant stenosis. No occlusion. No aneurysm. MIDDLE CEREBRAL ARTERIES: No significant stenosis. No occlusion. No aneurysm. POSTERIOR CEREBRAL ARTERIES: There is multifocal moderate-to-severe stenosis of the left P3 segment, increased since prior exam (series 12, image 21). No  occlusion. No aneurysm. BASILAR ARTERY: No significant stenosis. No occlusion. No aneurysm. OTHER: There is a left posterior communicating artery. SOFT TISSUES: No acute finding. No masses or lymphadenopathy. BONES: No acute osseous abnormality. LUNGS: Bilateral upper lung centrilobular pulmonary emphysema. IMPRESSION: 1. No large vessel occlusion or hemodynamically significant stenosis of the major intracranial or cervical arteries. This finding was communicated to Dr. Voncile via the [facility] text paging service at 3:06 pm. 2. Since 03/06/2022, increased multifocal moderate-to-severe stenosis of the left PCA P3 segment. Electronically signed by: Prentice Spade 09/02/2024 04:02 PM EST RP Workstation: [id number]   CT HEAD CODE STROKE WO CONTRAST Result Date: 09/02/2024 EXAM: CT HEAD WITHOUT CONTRAST 09/02/2024 02:42:16 PM TECHNIQUE: CT of the head was performed without the administration of intravenous contrast. Automated exposure control, iterative reconstruction, and/or weight based adjustment of the mA/kV was utilized to reduce the radiation dose to as low as reasonably achievable. COMPARISON: CT head 10/12/2023 and MRI brain 03/07/2022. CLINICAL HISTORY: Neuro deficit, acute, stroke suspected. FINDINGS: BRAIN AND VENTRICLES: No acute hemorrhage. No evidence of acute territorial infarct. No extra-axial collection. No mass effect or midline shift. There is overall similar mild parenchymal volume loss advanced for the patient's stated age. Overall similar confluent and severe periventricular scattered white matter hypodensities which are nonspecific but most commonly represent chronic microvascular ischemic changes. Re-demonstrated chronic left gangliocapsular infarction, chronic right basal ganglia lacunar infarctions, and chronic infarction in the left corona radiata. Ex-vacuo dilatation of the left lateral ventricle, again noted. ASPECTS 10. ORBITS: No acute abnormality. SINUSES: Mucosal thickening right  maxillary sinus. Retention cyst left sphenoid sinus. SOFT TISSUES AND SKULL: No  acute soft tissue abnormality. No skull fracture. Edentulous. IMPRESSION: 1. No acute intracranial hemorrhage or acute territorial infarction. This finding was communicated via the Amion text paging service to Dr. Voncile at 3:05 pm. 2. ASPECTS 10. 3. No substantial change in additional chronic findings since 10/12/2023. Electronically signed by: Prentice Spade 09/02/2024 03:24 PM EST RP Workstation: [id number]    Scheduled Meds:   stroke: early stages of recovery book   Does not apply Once   aspirin   81 mg Oral Daily   atorvastatin   40 mg Oral q1800   clopidogrel   75 mg Oral Daily   famotidine   20 mg Oral BID   heparin   5,000 Units Subcutaneous Q8H   insulin  aspart  0-6 Units Subcutaneous Q4H   sodium chloride  flush  3 mL Intravenous Once   Continuous Infusions:     LOS: 0 days   Fredia Skeeter, MD [facility]  09/04/2024, 11:36 AM   *Please note that this is a verbal dictation therefore any spelling or grammatical errors are due to the Dragon Medical One system interpretation.  Please page via Amion and do not message via secure chat for urgent patient care matters. Secure chat can be used for non urgent patient care matters.  How to contact the [facility] Attending or Consulting provider 7A - 7P or covering provider during after hours 7P -7A, for this patient?  Check the care team in [facility] and look for a) attending/consulting [facility] provider listed and b) the [facility] team listed. Page or secure chat 7A-7P. Log into [email or web address] and use 's universal password to access. If you do not have the password, please contact the hospital operator. Locate the [facility] provider you are looking for under [facility] and page to a number that you can be directly reached. If you still have difficulty reaching the provider, please page the [facility] (Director on Call) for the Hospitalists listed on amion for  assistance.

## 2024-09-05 DIAGNOSIS — R4182 Altered mental status, unspecified: Secondary | ICD-10-CM | POA: Diagnosis not present

## 2024-09-05 LAB — GLUCOSE, CAPILLARY
Glucose-Capillary: 111 mg/dL — ABNORMAL HIGH (ref 70–99)
Glucose-Capillary: 95 mg/dL (ref 70–99)

## 2024-09-05 NOTE — Evaluation (Signed)
 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   Allergic rhinitis 04/18/2022   Altered mental status, unspecified 04/18/2022   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) 04/18/2022   Callus 04/18/2022   Dystrophia unguium 04/18/2022   Dystrophic nail 04/18/2022   Enlarged prostate 04/18/2022   Femoral neck fracture (HCC) 04/18/2022   Gastro-esophageal reflux disease without esophagitis 04/18/2022   Hepatitis C 04/18/2022   Hepatitis C antibody test positive 04/18/2022   Kidney stone 04/18/2022   Mild cognitive impairment of uncertain or unknown etiology 04/18/2022   Multiple pulmonary nodules 04/18/2022   Obstructive sleep apnea of adult 04/18/2022   Other general symptoms and signs 04/18/2022   Other specified problems related to psychosocial circumstances 04/18/2022   Shoulder joint painful on movement 04/18/2022   Simple renal cyst 04/18/2022   Sleep apnea 04/18/2022   Unsheltered homelessness 04/18/2022   Vascular dementia, unspecified severity, without behavioral disturbance, psychotic disturbance, mood disturbance, and anxiety (HCC) 04/18/2022   Bronchitis 04/18/2022   Cerebrovascular accident (HCC) 04/18/2022   Cognitive impairment 04/18/2022   Unspecified dementia, unspecified severity, without behavioral disturbance, psychotic disturbance, mood disturbance, and anxiety (HCC) 04/18/2022   Hyperlipidemia 03/09/2022   Chronic  obstructive pulmonary disease (COPD) (HCC) 03/09/2022   TIA (transient ischemic attack) 03/07/2022   Benign essential hypertension 07/29/2020   Hypokalemia 07/29/2020   Encephalopathy, hepatic (HCC) 07/29/2020   DM II (diabetes mellitus, type II), controlled (HCC) 07/29/2020   Visual hallucinations 07/29/2020   History of cirrhosis    Acute encephalopathy 09/02/2018   Multiple lacunar infarcts (HCC) 09/02/2018   Fracture of femoral neck, right, closed (HCC) 09/02/2018   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   TOBACCO DEPENDENCE 11/19/2006   BACK PAIN, LOW 11/19/2006   Tobacco dependence 11/19/2006   Low back pain 11/19/2006   Past Medical History:  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) 04/18/2022   BACK PAIN, LOW 11/19/2006   Qualifier: Diagnosis of  By: WATT, JOANNE     Benign essential hypertension 07/29/2020   Bronchitis 04/18/2022   Callus 04/18/2022   Cerebrovascular accident (HCC) 04/18/2022   Apr 25, 2020 Entered By: JULENE HAMILTON L Comment: MRI 2019 showed multiple infarcts throughout frontal and parietal white matter mixed ageAug 04, 2021 Entered By: JULENE HAMILTON L Comment: chronic infarcts in multiple other regions with severe chronic microvascular ischemic changes and moderate volume loss of brain   Chronic obstructive pulmonary disease (COPD) (HCC) 03/09/2022   Cirrhosis (HCC)    Cognitive impairment 04/18/2022   Diabetes mellitus without complication (HCC)    DM II (diabetes mellitus, type II), controlled (HCC) 07/29/2020   Dystrophia unguium 04/18/2022   Dystrophic nail 04/18/2022   Encephalopathy, hepatic (HCC) 07/29/2020   Enlarged prostate 04/18/2022   Femoral neck fracture (HCC) 04/18/2022    Apr 25, 2020 Entered By: JULENE HAMILTON L Comment: cannulated hip pinning on 09/04/2018   Fracture of femoral neck, right, closed (HCC) 09/02/2018   Gastro-esophageal reflux disease without esophagitis 04/18/2022   Hepatitis    Hepatitis C 04/18/2022   Hepatitis C antibody test positive 04/18/2022   Apr 14, 2016 Entered By: JADENE ALYSSA EARING Comment: SVR 04/08/16   History of cirrhosis    Hyperlipidemia 03/09/2022   Hypokalemia 07/29/2020   Kidney stone 04/18/2022   Low back pain 11/19/2006   Formatting of this note might be different from the original. 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   Mild cognitive impairment of uncertain or unknown etiology 04/18/2022   Multiple lacunar infarcts (HCC) 09/02/2018   Multiple pulmonary nodules 04/18/2022   Obstructive sleep apnea of adult 04/18/2022   Other general symptoms and signs 04/18/2022   Other specified problems related to psychosocial circumstances 04/18/2022   Shoulder joint painful on movement 04/18/2022   Simple renal cyst 04/18/2022   Sleep apnea 04/18/2022   Stroke [facility])    TIA (transient ischemic attack) 03/07/2022   TOBACCO DEPENDENCE 11/19/2006   Qualifier: Diagnosis of  By: WATT, JOANNE     Tobacco dependence 11/19/2006   Formatting of this note might be different from the original. Overview:  Qualifier: Diagnosis of  By: WATT, JOANNE   Unsheltered homelessness 04/18/2022   Unspecified dementia, unspecified severity, without behavioral disturbance, psychotic disturbance, mood disturbance, and anxiety (HCC) 04/18/2022   Vascular dementia, unspecified severity, without behavioral disturbance, psychotic disturbance, mood disturbance, and anxiety (HCC) 04/18/2022   Visual hallucinations 07/29/2020   Past Surgical History:  Past Surgical History:  Procedure Laterality Date   BACK SURGERY     HIP PINNING,CANNULATED Right 09/04/2018   Procedure: CANNULATED HIP PINNING;   Surgeon: Josefina Chew, MD;  Location: [facility] OR;  Service: Orthopedics;  Laterality: Right;   KNEE SURGERY     SHOULDER SURGERY     HPI:  68 y/o M presenting to ED On 12/12 with aphasia and L neglect, code stroke activated. 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 Patient with hx of expressive aphasia seen in OP ST for x1 session before requesting d/c.   Assessment / Plan / Recommendation Clinical Impression  Patient was evaluated via the Cognistat to assess cognitive linguistic skills. Patient oriented to self, however disoriented to time, location and situation. Patient with strengths in sustained attention to task and receptive language. Observed likely residual deficits in expressive language from prior CVAs in confrontational and responsive naming tasks. Patient with moderate deficits in problem solving and intellectual awareness and severe deficits in short term memory. Patient would benefit from acute ST and further ST services upon d/c to target language and cognition.     SLP Assessment  SLP Recommendation/Assessment: Patient needs continued Speech Language Pathology Services SLP Visit Diagnosis: Aphasia (R47.01);Cognitive communication deficit (R41.841)     Assistance Recommended at Discharge  Frequent or constant Supervision/Assistance  Functional Status Assessment Patient has had a recent decline in their functional status and/or demonstrates limited ability to make significant improvements in function in a reasonable and predictable amount of time  Frequency and Duration min 2x/week  2 weeks      SLP Evaluation Cognition  Overall Cognitive Status: Impaired/Different from baseline Arousal/Alertness: Awake/alert Orientation Level:  Oriented to person;Disoriented to time;Disoriented to place;Disoriented to situation Month:  December Day of Week: Incorrect Attention: Sustained Sustained Attention: Appears intact Memory: Impaired Memory Impairment: Decreased recall of new information;Decreased short term memory Decreased Short Term Memory: Verbal basic Awareness: Impaired Awareness Impairment: Intellectual impairment Problem Solving: Impaired Problem Solving Impairment: Verbal basic Executive Function: Self Monitoring Self Monitoring: Impaired Self Monitoring Impairment: Verbal basic Safety/Judgment: Impaired       Comprehension  Auditory Comprehension Overall Auditory Comprehension: Appears within functional limits for tasks assessed (largely intact for basic tasks) Yes/No Questions: Within Functional Limits Commands: Within Functional Limits Conversation: Simple    Expression Expression Primary Mode of Expression: Verbal Verbal Expression Overall Verbal Expression: Impaired Initiation: No impairment Level of Generative/Spontaneous Verbalization: Conversation Repetition: No impairment Naming: Impairment Responsive: 51-75% accurate Confrontation: Impaired Verbal Errors: Semantic paraphasias;Perseveration Interfering Components: Premorbid deficit   Oral / Motor  Motor Speech Overall Motor Speech: Appears within functional limits for tasks assessed            Jaamal Farooqui M.A., CCC-SLP 09/05/2024, 9:19 AM

## 2024-09-05 NOTE — Plan of Care (Signed)

## 2024-09-05 NOTE — TOC CAGE-AID Note (Signed)
 Transition of Care [facility]) - CAGE-AID Screening   Patient Details  Name: Matthew Roman MRN: [id number] Date of Birth: 04-22-1956  [facility] of [facility]) CM/SW Contact:    Adalyn Pennock E Divante Kotch, LCSW Phone Number: 09/05/2024, 10:18 AM   Clinical Narrative: Disoriented x 3.   CAGE-AID Screening: Substance Abuse Screening unable to be completed due to: : Patient unable to participate

## 2024-09-05 NOTE — Progress Notes (Signed)
 PROGRESS NOTE    Matthew Roman  FMW:[id number] DOB: 1955/12/17 DOA: 09/02/2024 PCP: [facility]   Brief Narrative:  Matthew Roman is a 68 y.o. male with hx of old stroke, ensuing dementia, multiple TIAs, cirrhosis, diabetes, hyperlipidemia, prior history of hepatic encephalopathy, tobacco use, sleep apnea, who lives with a caretaker, brought in for evaluation of sudden onset of confusion and left-sided neglect on EMS exam Presumably last known well was 11 AM 09/02/2024.  He was having difficulty putting on his coat and doing his day-to-day activities that he is able to do otherwise without a problem.  When EMS was called, they assessed him, he did appear confused, he had some aphasia and also had some left sided neglect for them, leading them to activate a code stroke.  He was admitted under hospital service, neurology consulted.  Details below.  Assessment & Plan:   Principal Problem:   AMS (altered mental status)  Acute metabolic encephalopathy secondary to acute ischemic stroke: Initial CT head negative, CTA head and neck negative for LVO, MRI brain eventually showed small acute or early subacute infarction involving posterior right frontal lobe subcortical white matter.  Reportedly, due to previous history of strokes, patient was supposed to be on DAPT however unsure whether he was taking them.  Regardless, they have been resumed by neurology along with atorvastatin .  I was able to speak to patient's caregiver  Quentin Kurk) who he lives with, according to her, at baseline he does have some confusion and he does not typically know what month or date is.  However when this event happened, he was more confused.  Based on my assessment today, patient is more alert, he knows he is in the [place].  He appears to be at his baseline. Echo ruled out PFO.  Seen by PT OT who recommend CIR. evaluated by CIR, per them, patient will not have 24/7 care after discharge and for that reason  they did not accept him for CIR and they recommended SNF.  TOC has been consulted, pending placement.  Essential hypertension: PTA medications include amlodipine  which is on hold to allow permissive hypertension for another 24 hours.  Type II obese mellitus, non-insulin -dependent: Patient on metformin  at home which is on hold.  Hemoglobin A1c only 5.5.  Currently on SSI, blood sugar is very well-controlled, no need of SSI, will discontinue that.  Hyperlipidemia: LDL 58, atorvastatin  40 mg resumed for neurology recommendation.  Tobacco abuse: I have discussed tobacco cessation with the patient.  I have counseled the patient regarding the negative impacts of continued tobacco use including but not limited to lung cancer, COPD, and cardiovascular disease.  I have discussed alternatives to tobacco and modalities that may help facilitate tobacco cessation including but not limited to biofeedback, hypnosis, and medications.  Total time spent with tobacco counseling was 5 minutes.  Chronic thrombocytopenia: Stable.  DVT prophylaxis: heparin  injection 5,000 Units Start: 09/03/24 0600   Code Status: Full Code  Family Communication:  None present at bedside.  Discussed with caretaker.  Status is: Observation The patient will require care spanning > 2 midnights and should be moved to inpatient because: Medically stable, pending placement to CIR.  Estimated body mass index is 23.98 kg/m as calculated from the following:   Height as of this encounter: 6' (1.829 m).   Weight as of this encounter: 80.2 kg.    Nutritional Assessment: Body mass index is 23.98 kg/m.[name] Seen by dietician.  I agree with the assessment and plan as  outlined below: Nutrition Status: Nutrition Problem: Increased nutrient needs Etiology: acute illness Signs/Symptoms: estimated needs Interventions: Ensure Enlive (each supplement provides 350kcal and 20 grams of protein)  . Skin Assessment: I have examined the patient's skin  and I agree with the wound assessment as performed by the wound care RN as outlined below:    Consultants:  Neurology  Procedures:  As above  Antimicrobials:  Anti-infectives (From admission, onward)    None         Subjective: Patient seen and examined.  Fully alert and oriented to place, person, at his baseline.  No complaints.  Objective: Vitals:   09/04/24 2007 09/05/24 0004 09/05/24 0459 09/05/24 0928  BP: 132/65 (!) 151/90 (!) 155/84 134/88  Pulse: 64 72 62 67  Resp:      Temp: 97.9 F (36.6 C) 98.1 F (36.7 C) 98.3 F (36.8 C) 97.7 F (36.5 C)  TempSrc:      SpO2: 98% 96% 97% 99%  Weight:      Height:        Intake/Output Summary (Last 24 hours) at 09/05/2024 1033 Last data filed at 09/05/2024 0900 Gross per 24 hour  Intake 484 ml  Output --  Net 484 ml   Filed Weights   09/02/24 1440  Weight: 80.2 kg    Examination:  General exam: Appears calm and comfortable  Respiratory system: Clear to auscultation. Respiratory effort normal. Cardiovascular system: S1 & S2 heard, RRR. No JVD, murmurs, rubs, gallops or clicks. No pedal edema. Gastrointestinal system: Abdomen is nondistended, soft and nontender. No organomegaly or masses felt. Normal bowel sounds heard. Central nervous system: Alert and oriented x 2. No focal neurological deficits. Extremities: Symmetric 5 x 5 power. Skin: No rashes, lesions or ulcers.  Psychiatry: Judgement and insight appear poor  Data Reviewed: I have personally reviewed following labs and imaging studies  CBC: Recent Labs  Lab 09/02/24 1438 09/02/24 1441 09/04/24 0554  WBC 4.6  --  3.6*  NEUTROABS 3.3  --  2.4  HGB 13.3 13.6 12.8*  HCT 42.0 40.0 37.8*  MCV 90.9  --  86.3  PLT 95*  --  79*   Basic Metabolic Panel: Recent Labs  Lab 09/02/24 1438 09/02/24 1441 09/02/24 2013  NA 138 141  --   K 4.0 4.0  --   CL 105 101  --   CO2 24  --   --   GLUCOSE 134* 131*  --   BUN 5* 5*  --   CREATININE 0.74 0.90   --   CALCIUM  8.6*  --   --   MG  --   --  1.6*   GFR: Estimated Creatinine Clearance: 85 mL/min (by C-G formula based on SCr of 0.9 mg/dL). Liver Function Tests: Recent Labs  Lab 09/02/24 1438  AST 24  ALT 34  ALKPHOS 96  BILITOT 0.5  PROT 5.9*  ALBUMIN 3.6   No results for input(s): LIPASE, AMYLASE in the last 168 hours. Recent Labs  Lab 09/02/24 1553  AMMONIA 26   Coagulation Profile: Recent Labs  Lab 09/02/24 1438  INR 1.1   Cardiac Enzymes: No results for input(s): CKTOTAL, CKMB, CKMBINDEX, TROPONINI in the last 168 hours. BNP (last 3 results) No results for input(s): PROBNP in the last 8760 hours. HbA1C: Recent Labs    09/02/24 2017  HGBA1C 5.5   CBG: Recent Labs  Lab 09/04/24 1152 09/04/24 1632 09/04/24 2006 09/05/24 0140 09/05/24 0501  GLUCAP 145* 173* 75 111* 95  Lipid Profile: Recent Labs    09/03/24 0906  CHOL 107  HDL 33*  LDLCALC 58  TRIG 79  CHOLHDL 3.2   Thyroid Function Tests: Recent Labs    09/02/24 2013  TSH 1.004  FREET4 0.81   Anemia Panel: Recent Labs    09/02/24 2013  VITAMINB12 1,221*   Sepsis Labs: Recent Labs  Lab 09/02/24 2013  PROCALCITON <0.10    No results found for this or any previous visit (from the past 240 hours).   Radiology Studies: EEG adult Result Date: 09/03/2024 Shelton Arlin KIDD, MD     09/03/2024  4:08 PM Patient Name: Juanya Villavicencio MRN: [id number] Epilepsy Attending: Arlin KIDD Shelton Referring Physician/Provider: Tobie Mario GAILS, MD Date: 09/03/2024 Duration: 24.09 mins Patient history: 68yo M with sudden onset of confusion and left-sided neglect. EEG to evaluate for seizure. Level of alertness: Awake AEDs during EEG study: None Technical aspects: This EEG study was done with scalp electrodes positioned according to the 10-20 International system of electrode placement. Electrical activity was reviewed with band pass filter of 1-70Hz , sensitivity of 7 uV/mm, display speed of  55mm/sec with a 60Hz  notched filter applied as appropriate. EEG data were recorded continuously and digitally stored.  Video monitoring was available and reviewed as appropriate. Description: The posterior dominant rhythm consists of 8 Hz activity of moderate voltage (25-35 uV) seen predominantly in posterior head regions, symmetric and reactive to eye opening and eye closing. There is intermittent generalized 3 to 6 Hz theta-delta slowing. Hyperventilation and photic stimulation were not performed.   ABNORMALITY - Intermittent slow, generalized IMPRESSION: This study is suggestive of generalized cerebral dysfunction (encephalopathy). No seizures or epileptiform discharges were seen throughout the recording. Arlin KIDD Shelton   ECHOCARDIOGRAM COMPLETE Result Date: 09/03/2024    ECHOCARDIOGRAM REPORT   Patient Name:   DARNEL MCHAN Date of Exam: 09/03/2024 Medical Rec #:  [id number]      Height:       72.0 in Accession #:    [id number]     Weight:       176.8 lb Date of Birth:  September 23, 1955      BSA:          2.022 m Patient Age:    68 years       BP:           157/82 mmHg Patient Gender: M              HR:           73 bpm. Exam Location:  Inpatient Procedure: 2D Echo (Both Spectral and Color Flow Doppler were utilized during            procedure). Indications:    stroke  History:        Patient has prior history of Echocardiogram examinations, most                 recent 03/07/2022. Cirrhosis, TIA, Stroke and COPD,                 Signs/Symptoms:Altered Mental Status; Risk Factors:Diabetes,                 Dyslipidemia, Current Smoker and Sleep Apnea.  Sonographer:    Tinnie Barefoot RDCS Referring Phys: [phone] EKTA V PATEL  Sonographer Comments: Image acquisition challenging due to respiratory motion and Image acquisition challenging due to COPD. IMPRESSIONS  1. Left ventricular ejection fraction, by estimation, is 60 to 65%. Left ventricular ejection fraction by  2D MOD biplane is 61.7 %. The left ventricle has  normal function. The left ventricle has no regional wall motion abnormalities. Left ventricular diastolic parameters are consistent with Grade I diastolic dysfunction (impaired relaxation).  2. Right ventricular systolic function is normal. The right ventricular size is normal. Tricuspid regurgitation signal is inadequate for assessing PA pressure.  3. The mitral valve is grossly normal. No evidence of mitral valve regurgitation.  4. The aortic valve was not well visualized. Aortic valve regurgitation is not visualized. No aortic stenosis is present.  5. The inferior vena cava is normal in size with greater than 50% respiratory variability, suggesting right atrial pressure of 3 mmHg. FINDINGS  Left Ventricle: Left ventricular ejection fraction, by estimation, is 60 to 65%. Left ventricular ejection fraction by 2D MOD biplane is 61.7 %. The left ventricle has normal function. The left ventricle has no regional wall motion abnormalities. The left ventricular internal cavity size was normal in size. There is no left ventricular hypertrophy. Left ventricular diastolic parameters are consistent with Grade I diastolic dysfunction (impaired relaxation). Indeterminate filling pressures. Right Ventricle: The right ventricular size is normal. No increase in right ventricular wall thickness. Right ventricular systolic function is normal. Tricuspid regurgitation signal is inadequate for assessing PA pressure. Left Atrium: Left atrial size was normal in size. Right Atrium: Right atrial size was normal in size. Pericardium: There is no evidence of pericardial effusion. Mitral Valve: The mitral valve is grossly normal. No evidence of mitral valve regurgitation. Tricuspid Valve: The tricuspid valve is normal in structure. Tricuspid valve regurgitation is not demonstrated. Aortic Valve: The aortic valve was not well visualized. Aortic valve regurgitation is not visualized. No aortic stenosis is present. Pulmonic Valve: The pulmonic  valve was not well visualized. Pulmonic valve regurgitation is not visualized. Aorta: The aortic root and ascending aorta are structurally normal, with no evidence of dilitation. Venous: The inferior vena cava is normal in size with greater than 50% respiratory variability, suggesting right atrial pressure of 3 mmHg. IAS/Shunts: No atrial level shunt detected by color flow Doppler.  LEFT VENTRICLE PLAX 2D                        Biplane EF (MOD) LVIDd:         4.40 cm         LV Biplane EF:   Left LVIDs:         3.00 cm                          ventricular LV PW:         1.00 cm                          ejection LV IVS:        1.00 cm                          fraction by LVOT diam:     2.20 cm                          2D MOD LV SV:         73  biplane is LV SV Index:   36                               61.7 %. LVOT Area:     3.80 cm LV IVRT:       106 msec        Diastology                                LV e' medial:  7.62 cm/s                                LV e' lateral: 8.16 cm/s LV Volumes (MOD) LV vol d, MOD    65.3 ml A2C: LV vol d, MOD    50.1 ml A4C: LV vol s, MOD    22.4 ml A2C: LV vol s, MOD    21.5 ml A4C: LV SV MOD A2C:   42.9 ml LV SV MOD A4C:   50.1 ml LV SV MOD BP:    35.1 ml RIGHT VENTRICLE             IVC RV Basal diam:  2.40 cm     IVC diam: 1.40 cm RV S prime:     15.10 cm/s TAPSE (M-mode): 1.8 cm LEFT ATRIUM             Index        RIGHT ATRIUM          Index LA Vol (A2C):   51.3 ml 25.37 ml/m  RA Area:     9.67 cm LA Vol (A4C):   42.7 ml 21.12 ml/m  RA Volume:   21.40 ml 10.58 ml/m LA Biplane Vol: 47.9 ml 23.69 ml/m  AORTIC VALVE LVOT Vmax:   97.00 cm/s LVOT Vmean:  64.900 cm/s LVOT VTI:    0.193 m  AORTA Ao Root diam: 3.60 cm Ao Asc diam:  3.30 cm  SHUNTS Systemic VTI:  0.19 m Systemic Diam: 2.20 cm Vinie Maxcy MD Electronically signed by Vinie Maxcy MD Signature Date/Time: 09/03/2024/12:23:32 PM    Final     Scheduled Meds:   stroke: early stages of  recovery book   Does not apply Once   aspirin   81 mg Oral Daily   atorvastatin   40 mg Oral q1800   clopidogrel   75 mg Oral Daily   famotidine   20 mg Oral BID   feeding supplement  237 mL Oral BID BM   heparin   5,000 Units Subcutaneous Q8H   sodium chloride  flush  3 mL Intravenous Once   Continuous Infusions:     LOS: 0 days   Fredia Skeeter, MD [facility]  09/05/2024, 10:33 AM   *Please note that this is a verbal dictation therefore any spelling or grammatical errors are due to the Dragon Medical One system interpretation.  Please page via Amion and do not message via secure chat for urgent patient care matters. Secure chat can be used for non urgent patient care matters.  How to contact the [facility] Attending or Consulting provider 7A - 7P or covering provider during after hours 7P -7A, for this patient?  Check the care team in [facility] and look for a) attending/consulting [facility] provider listed and b) the [facility] team listed. Page or secure chat 7A-7P. Log into [email or web address] and use [facility]'s universal password to access. If you do not have the password, please contact the hospital operator. Locate the [facility] provider you are looking for under [facility] and page to a number that you can be directly reached. If you still have difficulty reaching the provider, please page the [facility] (Director on Call) for the Hospitalists listed on amion for assistance.

## 2024-09-05 NOTE — Progress Notes (Signed)
 Occupational Therapy Treatment Patient Details Name: Matthew Roman MRN: [id number] DOB: [birth year] Today's Date: 09/05/2024   History of present illness 68 y/o M presenting to ED On 12/12 with aphasia and L neglect, code stroke activated. MRI with small punctate areas of restricted diffusion in R cerebral hemisphere.    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   OT comments  Pt progressing toward goals, noted slight improvements in cognition compared to baseline, pt oriented to self, but knew he was at a hospital. Pt CGA for ADLs and mobility throughout session, needs directional cues to locate room during session, overall still with decr insight to deficits and current condition. Pt presenting with impairments listed below, will follow acutely. Patient will benefit from continued inpatient follow up therapy, <3 hours/day to maximize safety/ind with ADL/functional mobility.       If plan is discharge home, recommend the following:  A little help with walking and/or transfers;A lot of help with bathing/dressing/bathroom;Assistance with cooking/housework;Direct supervision/assist for financial management;Direct supervision/assist for medications management;Assist for transportation;Supervision due to cognitive status;Help with stairs or ramp for entrance   Equipment Recommendations  Other (comment) (defer)    Recommendations for Other Services PT consult;Rehab consult    Precautions / Restrictions Restrictions Weight Bearing Restrictions Per Provider Order: No       Mobility Bed Mobility               General bed mobility comments: OOB upon arrival    Transfers Overall transfer level: Needs assistance Equipment used: None Transfers: Sit to/from Stand Sit to Stand: Contact guard assist                 Balance Overall balance assessment: Needs assistance Sitting-balance support: Feet  supported Sitting balance-Leahy Scale: Good     Standing balance support: During functional activity, No upper extremity supported Standing balance-Leahy Scale: Fair Standing balance comment: static standing                           ADL either performed or assessed with clinical judgement   ADL Overall ADL's : Needs assistance/impaired                 Upper Body Dressing : Contact guard assist       Toilet Transfer: Contact guard assist;Ambulation           Functional mobility during ADLs: Contact guard assist      Extremity/Trunk Assessment Upper Extremity Assessment LUE Deficits / Details: decr [facility]MC LUE Coordination: decreased fine motor   Lower Extremity Assessment Lower Extremity Assessment: Defer to PT evaluation        Vision   Vision Assessment?: Vision impaired- to be further tested in functional context Additional Comments: noted incr tracking ability this session to all visual fields, will continue to assess   Perception Perception Perception: Not tested   Praxis Praxis Praxis: Not tested   Communication Communication Communication: Impaired Factors Affecting Communication: Difficulty expressing self (wordfinding difficulties)   Cognition Arousal: Alert Behavior During Therapy: Flat affect Cognition: Cognition impaired   Orientation impairments: Situation, Time, Place (states he is [facility], unaware of date, but states DOB) Awareness: Intellectual awareness impaired, Online awareness impaired Memory impairment (select all impairments): Short-term memory, Working civil service fast streamer, Conservation officer, historic buildings Attention impairment (select first level of impairment): Sustained attention Executive functioning impairment (select all impairments): Problem solving, Reasoning, Sequencing, Organization,  Initiation OT - Cognition Comments: 3 errors with trailmaking task,needs mod cues and incr time to complete, can count backward 20-1,  appropriate responses to safety scenarios                 Following commands: Impaired Following commands impaired: Follows one step commands inconsistently, Follows one step commands with increased time      Cueing   Cueing Techniques: Verbal cues, Gestural cues  Exercises      Shoulder Instructions       General Comments VSS    Pertinent Vitals/ Pain       Pain Assessment Pain Assessment: No/denies pain  Home Living                                          Prior Functioning/Environment              Frequency  Min 2X/week        Progress Toward Goals  OT Goals(current goals can now be found in the care plan section)  Progress towards OT goals: Progressing toward goals  Acute Rehab OT Goals Patient Stated Goal: none stated OT Goal Formulation: With patient Time For Goal Achievement: 09/17/24 Potential to Achieve Goals: Good ADL Goals Pt Will Perform Grooming: Independently;standing Pt Will Perform Upper Body Dressing: Independently;sitting Pt Will Perform Lower Body Dressing: Independently;sit to/from stand;sitting/lateral leans Pt Will Transfer to Toilet: Independently;ambulating;regular height toilet Additional ADL Goal #1: pt will follow 2 step command with min cues in prep for ADLs  Plan      Co-evaluation                 AM-PAC OT 6 Clicks Daily Activity     Outcome Measure   Help from another person eating meals?: A Little Help from another person taking care of personal grooming?: A Little Help from another person toileting, which includes using toliet, bedpan, or urinal?: A Little Help from another person bathing (including washing, rinsing, drying)?: A Lot Help from another person to put on and taking off regular upper body clothing?: A Little Help from another person to put on and taking off regular lower body clothing?: A Lot 6 Click Score: 16    End of Session    OT Visit Diagnosis: Unsteadiness on  feet (R26.81);Other abnormalities of gait and mobility (R26.89);Muscle weakness (generalized) (M62.81);Other symptoms and signs involving cognitive function;Cognitive communication deficit (R41.841)   Activity Tolerance Patient tolerated treatment well   Patient Left in chair;with call bell/phone within reach   Nurse Communication Mobility status        Time: 9198-9180 OT Time Calculation (min): 18 min  Charges: OT General Charges $OT Visit: 1 Visit OT Treatments $Therapeutic Activity: 8-22 mins  Cady Hafen K, OTD, OTR/L SecureChat Preferred Acute Rehab [phone]   Laneta POUR Koonce 09/05/2024, 9:13 AM

## 2024-09-06 DIAGNOSIS — R4182 Altered mental status, unspecified: Secondary | ICD-10-CM | POA: Diagnosis not present

## 2024-09-06 LAB — BASIC METABOLIC PANEL WITH GFR
Anion gap: 9 (ref 5–15)
BUN: 16 mg/dL (ref 8–23)
CO2: 28 mmol/L (ref 22–32)
Calcium: 9.6 mg/dL (ref 8.9–10.3)
Chloride: 102 mmol/L (ref 98–111)
Creatinine, Ser: 0.83 mg/dL (ref 0.61–1.24)
GFR, Estimated: >60 mL/min (ref >60)
Glucose, Bld: 113 mg/dL — ABNORMAL HIGH (ref 70–99)
Potassium: 5.2 mmol/L — ABNORMAL HIGH (ref 3.5–5.1)
Sodium: 140 mmol/L (ref 135–145)

## 2024-09-06 LAB — CBC WITH DIFFERENTIAL/PLATELET
Abs Immature Granulocytes: 0.01 K/uL (ref 0.00–0.07)
Basophils Absolute: 0 K/uL (ref 0.0–0.1)
Basophils Relative: 1 %
Eosinophils Absolute: 0.1 K/uL (ref 0.0–0.5)
Eosinophils Relative: 2 %
HCT: 42.9 % (ref 39.0–52.0)
Hemoglobin: 14.1 g/dL (ref 13.0–17.0)
Immature Granulocytes: 0 %
Lymphocytes Relative: 15 %
Lymphs Abs: 0.7 K/uL (ref 0.7–4.0)
MCH: 29.6 pg (ref 26.0–34.0)
MCHC: 32.9 g/dL (ref 30.0–36.0)
MCV: 90.1 fL (ref 80.0–100.0)
Monocytes Absolute: 0.4 K/uL (ref 0.1–1.0)
Monocytes Relative: 8 %
Neutro Abs: 3.4 K/uL (ref 1.7–7.7)
Neutrophils Relative %: 74 %
Platelets: 101 K/uL — ABNORMAL LOW (ref 150–400)
RBC: 4.76 MIL/uL (ref 4.22–5.81)
RDW: 14.2 % (ref 11.5–15.5)
WBC: 4.6 K/uL (ref 4.0–10.5)
nRBC: 0 % (ref 0.0–0.2)

## 2024-09-06 NOTE — Progress Notes (Signed)
 PROGRESS NOTE    Matthew Roman  FMW:[id number] DOB: April 01, 1956 DOA: 09/02/2024 PCP: [facility]   Brief Narrative:  Matthew Roman is a 68 y.o. male with hx of old stroke, ensuing dementia, multiple TIAs, cirrhosis, diabetes, hyperlipidemia, prior history of hepatic encephalopathy, tobacco use, sleep apnea, who lives with a caretaker, brought in for evaluation of sudden onset of confusion and left-sided neglect on EMS exam Presumably last known well was 11 AM 09/02/2024.  He was having difficulty putting on his coat and doing his day-to-day activities that he is able to do otherwise without a problem.  When EMS was called, they assessed him, he did appear confused, he had some aphasia and also had some left sided neglect for them, leading them to activate a code stroke.  He was admitted under hospital service, neurology consulted.  Details below.  Assessment & Plan:   Principal Problem:   AMS (altered mental status)  Acute metabolic encephalopathy secondary to acute ischemic stroke: Initial CT head negative, CTA head and neck negative for LVO, MRI brain eventually showed small acute or early subacute infarction involving posterior right frontal lobe subcortical white matter.  Reportedly, due to previous history of strokes, patient was supposed to be on DAPT however unsure whether he was taking them.  Regardless, they have been resumed by neurology along with atorvastatin .  I was able to speak to patient's caregiver  Quentin Kurk) who he lives with, according to her, at baseline he does have some confusion and he does not typically know what month or date is.  However when this event happened, he was more confused.  Based on my assessment today, patient is more alert, he knows he is in the [place].  He appears to be at his baseline. Echo ruled out PFO.  Seen by PT OT who recommend CIR. evaluated by CIR, per them, patient will not have 24/7 care after discharge and for that reason  they did not accept him for CIR and they recommended SNF.  TOC has been consulted, pending placement.  Essential hypertension: PTA medications include amlodipine  which was on hold to allow permissive hypertension however patient blood pressure has not been elevated enough to warrant resuming amlodipine .  Type II obese mellitus, non-insulin -dependent: Patient on metformin  at home which is on hold.  Hemoglobin A1c only 5.5.  Blood sugar has been very well-controlled, discontinued SSI.  Hyperlipidemia: LDL 58, atorvastatin  40 mg resumed per neurology recommendation.  Tobacco abuse: I have discussed tobacco cessation with the patient.  I have counseled the patient regarding the negative impacts of continued tobacco use including but not limited to lung cancer, COPD, and cardiovascular disease.  I have discussed alternatives to tobacco and modalities that may help facilitate tobacco cessation including but not limited to biofeedback, hypnosis, and medications.  Total time spent with tobacco counseling was 5 minutes.  Chronic thrombocytopenia: Stable.  DVT prophylaxis: heparin  injection 5,000 Units Start: 09/03/24 0600   Code Status: Full Code  Family Communication:  None present at bedside.  Discussed with caretaker.  Status is: Observation The patient will require care spanning > 2 midnights and should be moved to inpatient because: Medically stable, pending placement to CIR.  Estimated body mass index is 23.98 kg/m as calculated from the following:   Height as of this encounter: 6' (1.829 m).   Weight as of this encounter: 80.2 kg.    Nutritional Assessment: Body mass index is 23.98 kg/m.[name] Seen by dietician.  I agree with the assessment and  plan as outlined below: Nutrition Status: Nutrition Problem: Increased nutrient needs Etiology: acute illness Signs/Symptoms: estimated needs Interventions: Ensure Enlive (each supplement provides 350kcal and 20 grams of protein)  . Skin  Assessment: I have examined the patient's skin and I agree with the wound assessment as performed by the wound care RN as outlined below:    Consultants:  Neurology  Procedures:  As above  Antimicrobials:  Anti-infectives (From admission, onward)    None         Subjective: Patient seen and examined, walking around in the room.  He has no complaints.  Objective: Vitals:   09/06/24 0025 09/06/24 0438 09/06/24 0502 09/06/24 0743  BP: (!) 164/81 (!) 149/78 (!) 148/78 127/66  Pulse: (!) 58 (!) 56 63 (!) 56  Resp:  16  18  Temp: 97.8 F (36.6 C) 98.9 F (37.2 C) 97.8 F (36.6 C) 97.7 F (36.5 C)  TempSrc:  Oral    SpO2: 98% 98% 97% 94%  Weight:      Height:       No intake or output data in the 24 hours ending 09/06/24 1114  Filed Weights   09/02/24 1440  Weight: 80.2 kg    Examination:  General exam: Appears calm and comfortable  Respiratory system: Clear to auscultation. Respiratory effort normal. Cardiovascular system: S1 & S2 heard, RRR. No JVD, murmurs, rubs, gallops or clicks. No pedal edema. Gastrointestinal system: Abdomen is nondistended, soft and nontender. No organomegaly or masses felt. Normal bowel sounds heard. Central nervous system: Alert and oriented x 2. No focal neurological deficits. Extremities: Symmetric 5 x 5 power. Skin: No rashes, lesions or ulcers.  Psychiatry: Judgement and insight appear poor  Data Reviewed: I have personally reviewed following labs and imaging studies  CBC: Recent Labs  Lab 09/02/24 1438 09/02/24 1441 09/04/24 0554  WBC 4.6  --  3.6*  NEUTROABS 3.3  --  2.4  HGB 13.3 13.6 12.8*  HCT 42.0 40.0 37.8*  MCV 90.9  --  86.3  PLT 95*  --  79*   Basic Metabolic Panel: Recent Labs  Lab 09/02/24 1438 09/02/24 1441 09/02/24 2013  NA 138 141  --   K 4.0 4.0  --   CL 105 101  --   CO2 24  --   --   GLUCOSE 134* 131*  --   BUN 5* 5*  --   CREATININE 0.74 0.90  --   CALCIUM  8.6*  --   --   MG  --   --  1.6*    GFR: Estimated Creatinine Clearance: 85 mL/min (by C-G formula based on SCr of 0.9 mg/dL). Liver Function Tests: Recent Labs  Lab 09/02/24 1438  AST 24  ALT 34  ALKPHOS 96  BILITOT 0.5  PROT 5.9*  ALBUMIN 3.6   No results for input(s): LIPASE, AMYLASE in the last 168 hours. Recent Labs  Lab 09/02/24 1553  AMMONIA 26   Coagulation Profile: Recent Labs  Lab 09/02/24 1438  INR 1.1   Cardiac Enzymes: No results for input(s): CKTOTAL, CKMB, CKMBINDEX, TROPONINI in the last 168 hours. BNP (last 3 results) No results for input(s): PROBNP in the last 8760 hours. HbA1C: No results for input(s): HGBA1C in the last 72 hours.  CBG: Recent Labs  Lab 09/04/24 1152 09/04/24 1632 09/04/24 2006 09/05/24 0140 09/05/24 0501  GLUCAP 145* 173* 75 111* 95   Lipid Profile: No results for input(s): CHOL, HDL, LDLCALC, TRIG, CHOLHDL, LDLDIRECT in the last 72  hours.  Thyroid Function Tests: No results for input(s): TSH, T4TOTAL, FREET4, T3FREE, THYROIDAB in the last 72 hours.  Anemia Panel: No results for input(s): VITAMINB12, FOLATE, FERRITIN, TIBC, IRON, RETICCTPCT in the last 72 hours.  Sepsis Labs: Recent Labs  Lab 09/02/24 2013  PROCALCITON <0.10    No results found for this or any previous visit (from the past 240 hours).   Radiology Studies: No results found.   Scheduled Meds:   stroke: early stages of recovery book   Does not apply Once   aspirin   81 mg Oral Daily   atorvastatin   40 mg Oral q1800   clopidogrel   75 mg Oral Daily   famotidine   20 mg Oral BID   feeding supplement  237 mL Oral BID BM   heparin   5,000 Units Subcutaneous Q8H   sodium chloride  flush  3 mL Intravenous Once   Continuous Infusions:     LOS: 0 days   Fredia Skeeter, MD [facility]  09/06/2024, 11:14 AM   *Please note that this is a verbal dictation therefore any spelling or grammatical errors are due to the Dragon  Medical One system interpretation.  Please page via Amion and do not message via secure chat for urgent patient care matters. Secure chat can be used for non urgent patient care matters.  How to contact the [facility] Attending or Consulting provider 7A - 7P or covering provider during after hours 7P -7A, for this patient?  Check the care team in [facility] and look for a) attending/consulting TR[facility] provider listed and b) the TR[facility] team listed. Page or secure chat 7A-7P. Log into [email or web address] and use [place]'s universal password to access. If you do not have the password, please contact the hospital operator. Locate the [facility] provider you are looking for under [facility] and page to a number that you can be directly reached. If you still have difficulty reaching the provider, please page the [facility] (Director on Call) for the Hospitalists listed on amion for assistance.

## 2024-09-06 NOTE — NC FL2 (Signed)
 [place]  MEDICAID [id number] LEVEL OF CARE FORM     IDENTIFICATION  Patient Name: Matthew Roman Birthdate: 1955/11/17 Sex: male Admission Date (Current Location): 09/02/2024  [facility] and [place] Number:  [employer] and Address:  [facility], [address], [place], [place] [address]      Provider Number: [id number]  Attending Physician Name and Address:  Vernon Ranks, MD  Relative Name and Phone Number:  Jacquline Tuttle/Granddaughter: [phone]    Current Level of Care: Hospital Recommended Level of Care: Skilled Nursing Facility Prior Approval Number:    Date Approved/Denied:   PASRR Number: [id number]  Discharge Plan: SNF    Current Diagnoses: Patient Active Problem List   Diagnosis Date Noted   AMS (altered mental status) 09/02/2024   Acute metabolic encephalopathy 08/04/2022   Stroke (HCC) 04/18/2022   Hepatitis 04/18/2022   Diabetes mellitus without complication (HCC) 04/18/2022   Cirrhosis (HCC) 04/18/2022   Acute upper respiratory infection, unspecified 04/18/2022   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   Allergic rhinitis 04/18/2022   Altered mental status, unspecified 04/18/2022   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) 04/18/2022   Callus 04/18/2022   Dystrophia unguium 04/18/2022   Dystrophic nail 04/18/2022   Enlarged prostate 04/18/2022   Femoral neck fracture (HCC) 04/18/2022   Gastro-esophageal reflux disease without esophagitis 04/18/2022   Hepatitis C 04/18/2022   Hepatitis C antibody test positive 04/18/2022   Kidney stone 04/18/2022   Mild cognitive impairment of uncertain or unknown etiology 04/18/2022   Multiple pulmonary nodules 04/18/2022   Obstructive sleep apnea of adult 04/18/2022   Other general symptoms and signs 04/18/2022   Other specified problems related to psychosocial circumstances 04/18/2022   Shoulder joint painful on movement 04/18/2022   Simple renal cyst 04/18/2022    Sleep apnea 04/18/2022   Unsheltered homelessness 04/18/2022   Vascular dementia, unspecified severity, without behavioral disturbance, psychotic disturbance, mood disturbance, and anxiety (HCC) 04/18/2022   Bronchitis 04/18/2022   Cerebrovascular accident (HCC) 04/18/2022   Cognitive impairment 04/18/2022   Unspecified dementia, unspecified severity, without behavioral disturbance, psychotic disturbance, mood disturbance, and anxiety (HCC) 04/18/2022   Hyperlipidemia 03/09/2022   Chronic obstructive pulmonary disease (COPD) (HCC) 03/09/2022   TIA (transient ischemic attack) 03/07/2022   Benign essential hypertension 07/29/2020   Hypokalemia 07/29/2020   Encephalopathy, hepatic (HCC) 07/29/2020   DM II (diabetes mellitus, type II), controlled (HCC) 07/29/2020   Visual hallucinations 07/29/2020   History of cirrhosis    Acute encephalopathy 09/02/2018   Multiple lacunar infarcts (HCC) 09/02/2018   Fracture of femoral neck, right, closed (HCC) 09/02/2018   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   TOBACCO DEPENDENCE 11/19/2006   BACK PAIN, LOW 11/19/2006   Tobacco dependence 11/19/2006   Low back pain 11/19/2006    Orientation RESPIRATION BLADDER Height & Weight     Self, Place  Normal Incontinent Weight: 176 lb 12.9 oz (80.2 kg) Height:  6' (182.9 cm)  BEHAVIORAL SYMPTOMS/MOOD NEUROLOGICAL BOWEL NUTRITION STATUS      Continent Diet (please refer to dc summary)  AMBULATORY STATUS COMMUNICATION OF NEEDS Skin   Limited Assist Verbally Normal                       Personal Care Assistance Level of Assistance  Bathing, Feeding, Dressing Bathing Assistance: Limited assistance Feeding assistance: Independent Dressing Assistance: Limited assistance     Functional Limitations Info  Hearing, Speech, Sight Sight Info: Impaired Hearing Info:  Impaired Speech Info: Adequate    SPECIAL CARE FACTORS FREQUENCY  PT (By licensed PT), OT (By licensed OT)     PT  Frequency: 5x/week OT Frequency: 5x/week            Contractures Contractures Info: Not present    Additional Factors Info  Code Status, Allergies Code Status Info: Full Allergies Info: NKA           Current Medications (09/06/2024):  This is the current hospital active medication list Current Facility-Administered Medications  Medication Dose Route Frequency Provider Last Rate Last Admin    stroke: early stages of recovery book   Does not apply Once Tobie Mario GAILS, MD       acetaminophen  (TYLENOL ) tablet 650 mg  650 mg Oral Q4H PRN Patel, Ekta V, MD   650 mg at 09/03/24 0207   Or   acetaminophen  (TYLENOL ) 160 MG/5ML solution 650 mg  650 mg Per Tube Q4H PRN Patel, Ekta V, MD       Or   acetaminophen  (TYLENOL ) suppository 650 mg  650 mg Rectal Q4H PRN Tobie Mario GAILS, MD       albuterol  (PROVENTIL ) (2.5 MG/3ML) 0.083% nebulizer solution 3 mL  3 mL Inhalation QID PRN Patel, Ekta V, MD       aspirin  chewable tablet 81 mg  81 mg Oral Daily Powell, Lisa K, RPH   81 mg at 09/06/24 9176   atorvastatin  (LIPITOR) tablet 40 mg  40 mg Oral q1800 Tobie Mario GAILS, MD   40 mg at 09/05/24 1803   clopidogrel  (PLAVIX ) tablet 75 mg  75 mg Oral Daily Tobie Mario GAILS, MD   75 mg at 09/06/24 9176   famotidine  (PEPCID ) tablet 20 mg  20 mg Oral BID Pahwani, Ravi, MD   20 mg at 09/05/24 2117   feeding supplement (ENSURE PLUS HIGH PROTEIN) liquid 237 mL  237 mL Oral BID BM Vernon Ranks, MD   237 mL at 09/06/24 0823   heparin  injection 5,000 Units  5,000 Units Subcutaneous Q8H Patel, Ekta V, MD   5,000 Units at 09/06/24 9394   sodium chloride  flush (NS) 0.9 % injection 3 mL  3 mL Intravenous Once Kingsley, Victoria K, DO         Discharge Medications: Please see discharge summary for a list of discharge medications.  Relevant Imaging Results:  Relevant Lab Results:   Additional Information SSN: [id number]  [name], LCSWA

## 2024-09-06 NOTE — Progress Notes (Signed)
 Physical Therapy Treatment Patient Details Name: Matthew Roman MRN: [id number] DOB: 01-15-1956 Today's Date: 09/06/2024   History of Present Illness 68 y/o M presenting to ED On 12/12 with aphasia and L neglect, code stroke activated. MRI with small punctate areas of restricted diffusion in R cerebral hemisphere.    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    PT Comments  Pt received walking in the hall and agreeable to session. Pt demonstrates improved stability this session, but poor awareness requiring cues for safety. Pt tends to slide feet forward and is able to briefly improve with cues, but demonstrates poor carryover. Pt able to perform balance challenges with increased instability when stepping over an obstacle requiring min A for balance. Pt able to perform static standing marches and x10 serial STS with good activity tolerance. Pt continues to benefit from PT services to progress toward functional mobility goals.    If plan is discharge home, recommend the following: A little help with walking and/or transfers;A little help with bathing/dressing/bathroom;Assistance with cooking/housework;Direct supervision/assist for medications management;Direct supervision/assist for financial management;Assist for transportation;Help with stairs or ramp for entrance;Supervision due to cognitive status   Can travel by private vehicle        Equipment Recommendations  None recommended by PT    Recommendations for Other Services       Precautions / Restrictions Precautions Precautions: Fall Recall of Precautions/Restrictions: Impaired Restrictions Weight Bearing Restrictions Per Provider Order: No     Mobility  Bed Mobility               General bed mobility comments: OOB upon arrival    Transfers Overall transfer level: Needs assistance Equipment used: None Transfers: Sit to/from Stand Sit to Stand:  Supervision           General transfer comment: serial STS without UE support from recliner    Ambulation/Gait Ambulation/Gait assistance: Contact guard assist, Min assist Gait Distance (Feet): 200 Feet Assistive device: None Gait Pattern/deviations: Step-through pattern, Decreased stance time - left, Decreased stride length       General Gait Details: slight instability, but no LOB. Decreased pace with challenges (head turns, change in pace, stepping over obstacle). Min A for balance when stepping over obstacle. Frequent cues for increased foot clearance due to feet sliding   Stairs             Wheelchair Mobility     Tilt Bed    Modified Rankin (Stroke Patients Only)       Balance Overall balance assessment: Needs assistance Sitting-balance support: Feet supported Sitting balance-Leahy Scale: Good     Standing balance support: During functional activity, No upper extremity supported Standing balance-Leahy Scale: Fair Standing balance comment: able to perform challenges with change in pace and slightly increased instability, but no overt LOB               High Level Balance Comments: head turns, change in pace, stepping over obstacle            Communication Communication Communication: Impaired Factors Affecting Communication: Difficulty expressing self  Cognition Arousal: Alert Behavior During Therapy: Flat affect   PT - Cognitive impairments: No family/caregiver present to determine baseline, Orientation, Awareness, Memory, Attention, Initiation, Sequencing, Problem solving, Safety/Judgement                         Following commands: Impaired Following commands  impaired: Follows one step commands with increased time    Cueing Cueing Techniques: Verbal cues, Gestural cues  Exercises Other Exercises Other Exercises: static standing marches for balance challenge Other Exercises: x10 serial STS without UE support    General  Comments        Pertinent Vitals/Pain Pain Assessment Pain Assessment: No/denies pain     PT Goals (current goals can now be found in the care plan section) Acute Rehab PT Goals Patient Stated Goal: Go home PT Goal Formulation: Patient unable to participate in goal setting Time For Goal Achievement: 09/17/24 Progress towards PT goals: Progressing toward goals    Frequency    Min 2X/week       AM-PAC PT 6 Clicks Mobility   Outcome Measure  Help needed turning from your back to your side while in a flat bed without using bedrails?: None Help needed moving from lying on your back to sitting on the side of a flat bed without using bedrails?: None Help needed moving to and from a bed to a chair (including a wheelchair)?: A Little Help needed standing up from a chair using your arms (e.g., wheelchair or bedside chair)?: A Little Help needed to walk in hospital room?: A Little Help needed climbing 3-5 steps with a railing? : A Little 6 Click Score: 20    End of Session Equipment Utilized During Treatment: Gait belt Activity Tolerance: Patient tolerated treatment well Patient left: in chair;with call bell/phone within reach Nurse Communication: Mobility status PT Visit Diagnosis: Unsteadiness on feet (R26.81);Other abnormalities of gait and mobility (R26.89);Difficulty in walking, not elsewhere classified (R26.2);Other symptoms and signs involving the nervous system (R29.898)     Time: 9048-8995 PT Time Calculation (min) (ACUTE ONLY): 13 min  Charges:    $Gait Training: 8-22 mins PT General Charges $$ ACUTE PT VISIT: 1 Visit                    Darryle George, PTA Acute Rehabilitation Services Secure Chat Preferred  Office:([phone]    Darryle George 09/06/2024, 10:44 AM

## 2024-09-06 NOTE — TOC Progression Note (Signed)
 Transition of Care [facility]) - Progression Note    Patient Details  Name: Matthew Roman MRN: [id number] Date of Birth: 12-Jan-1956  Transition of Care [facility]) CM/SW Contact  [name], [place] Phone Number: 09/06/2024, 12:35 PM  Clinical Narrative:     CSW spoke with patient at bedside regarding bed offers for SNF. Patient would prefer to go home and shared he has a walker and cane at home. CSW reached out to patient's caregiver and his daughter to ask about discharge plan. Caregiver is unable to provide the care the patient needs as he has been growing more confused and is not able to complete ADLs like he used to. Patient's daughter agreed. Both caregiver and daughter identified [facility] as their facility of choice. CSW will submit auth with the [place].   Expected Discharge Plan: Skilled Nursing Facility Barriers to Discharge: Insurance Authorization               Expected Discharge Plan and Services       Living arrangements for the past 2 months: Single Family Home                                       Social Drivers of Health (SDOH) Interventions SDOH Screenings   Food Insecurity: Patient Unable To Answer (09/03/2024)  Housing: Patient Unable To Answer (09/03/2024)  Transportation Needs: Patient Unable To Answer (09/03/2024)  Utilities: Patient Unable To Answer (09/03/2024)  Social Connections: Patient Unable To Answer (09/03/2024)  Tobacco Use: High Risk (09/03/2024)    Readmission Risk Interventions     No data to display

## 2024-09-06 NOTE — Plan of Care (Signed)

## 2024-09-06 NOTE — Plan of Care (Signed)
°  Problem: Self-Care: Goal: Ability to participate in self-care as condition permits will improve Outcome: Progressing   Problem: Fluid Volume: Goal: Ability to maintain a balanced intake and output will improve Outcome: Progressing   Problem: Metabolic: Goal: Ability to maintain appropriate glucose levels will improve Outcome: Progressing   Problem: Skin Integrity: Goal: Risk for impaired skin integrity will decrease Outcome: Progressing   Problem: Clinical Measurements: Goal: Will remain free from infection Outcome: Progressing

## 2024-09-07 DIAGNOSIS — E782 Mixed hyperlipidemia: Secondary | ICD-10-CM

## 2024-09-07 DIAGNOSIS — I10 Essential (primary) hypertension: Secondary | ICD-10-CM | POA: Insufficient documentation

## 2024-09-07 DIAGNOSIS — I639 Cerebral infarction, unspecified: Secondary | ICD-10-CM | POA: Diagnosis not present

## 2024-09-07 DIAGNOSIS — Z720 Tobacco use: Secondary | ICD-10-CM

## 2024-09-07 DIAGNOSIS — G9341 Metabolic encephalopathy: Secondary | ICD-10-CM | POA: Diagnosis not present

## 2024-09-07 LAB — POTASSIUM: Potassium: 4.7 mmol/L (ref 3.5–5.1)

## 2024-09-07 NOTE — Progress Notes (Signed)
 Physical Therapy Treatment Patient Details Name: Matthew Roman MRN: [id number] DOB: 02-02-1956 Today's Date: 09/07/2024   History of Present Illness 68 y/o M presenting to ED On 12/12 with aphasia and L neglect, code stroke activated. MRI with small punctate areas of restricted diffusion in R cerebral hemisphere.    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    PT Comments  Pt received walking in the hallway and agreeable to session. Pt able to complete gait and stair trials with intermittent min A due to instability and frequent cues for foot clearance due to pt sliding feet. Pt demonstrates very slow pace and processing during session. Pt scored 11/24 on DGI, which is a slight improvement but continues to suggest pt is at a high risk of falls. Pt continues to benefit from PT services to progress toward functional mobility goals.     If plan is discharge home, recommend the following: A little help with walking and/or transfers;A little help with bathing/dressing/bathroom;Assistance with cooking/housework;Direct supervision/assist for medications management;Direct supervision/assist for financial management;Assist for transportation;Help with stairs or ramp for entrance;Supervision due to cognitive status   Can travel by private vehicle        Equipment Recommendations  None recommended by PT    Recommendations for Other Services       Precautions / Restrictions Precautions Precautions: Fall Recall of Precautions/Restrictions: Impaired Restrictions Weight Bearing Restrictions Per Provider Order: No     Mobility  Bed Mobility               General bed mobility comments: OOB upon arrival    Transfers Overall transfer level: Needs assistance Equipment used: None Transfers: Sit to/from Stand Sit to Stand: Supervision           General transfer comment: serial STS without UE support from  recliner    Ambulation/Gait Ambulation/Gait assistance: Contact guard assist, Min assist Gait Distance (Feet): 400 Feet Assistive device: None Gait Pattern/deviations: Step-through pattern, Decreased stance time - left, Decreased stride length Gait velocity: dec     General Gait Details: slight instability and slow pace with 1 mild LOB with head turns requiring min A to correct. Decreased pace with challenges. Frequent cues for increased foot clearance due to feet sliding with some improvement, but poor carryover.   Stairs Stairs: Yes Stairs assistance: Min assist, Contact guard assist Stair Management: One rail Right, Alternating pattern Number of Stairs: 10 General stair comments: Pt demonstrates slow pace with decreased RLE foot clearance and tends to hit toe during ascent and heel during descent. Posterior lean at one point requiring min A to correct   Wheelchair Mobility     Tilt Bed    Modified Rankin (Stroke Patients Only)       Balance Overall balance assessment: Needs assistance Sitting-balance support: Feet supported Sitting balance-Leahy Scale: Good     Standing balance support: During functional activity, No upper extremity supported Standing balance-Leahy Scale: Fair Standing balance comment: without AD with mild LOB and instability requiring CGA and intermittent min A                     Dynamic Gait Index Level Surface: Mild Impairment Change in Gait Speed: Mild Impairment Gait with Horizontal Head Turns: Moderate Impairment Gait with Vertical Head Turns: Moderate Impairment Gait and Pivot Turn: Moderate Impairment Step Over Obstacle: Moderate Impairment Step Around Obstacles: Moderate Impairment Steps: Mild Impairment Total Score: 11  Communication Communication Communication: Impaired Factors Affecting Communication: Difficulty expressing self  Cognition Arousal: Alert Behavior During Therapy: Flat affect   PT - Cognitive  impairments: No family/caregiver present to determine baseline, Orientation, Awareness, Memory, Attention, Initiation, Sequencing, Problem solving, Safety/Judgement                       PT - Cognition Comments: Pt unsure of month or year. States he is in the hospital for a fall. Following commands: Impaired Following commands impaired: Follows one step commands with increased time    Cueing Cueing Techniques: Verbal cues, Gestural cues  Exercises Other Exercises Other Exercises: x10 serial STS without UE support    General Comments        Pertinent Vitals/Pain Pain Assessment Pain Assessment: No/denies pain     PT Goals (current goals can now be found in the care plan section) Acute Rehab PT Goals Patient Stated Goal: Go home PT Goal Formulation: Patient unable to participate in goal setting Time For Goal Achievement: 09/17/24 Progress towards PT goals: Progressing toward goals    Frequency    Min 2X/week       AM-PAC PT 6 Clicks Mobility   Outcome Measure  Help needed turning from your back to your side while in a flat bed without using bedrails?: None Help needed moving from lying on your back to sitting on the side of a flat bed without using bedrails?: None Help needed moving to and from a bed to a chair (including a wheelchair)?: A Little Help needed standing up from a chair using your arms (e.g., wheelchair or bedside chair)?: A Little Help needed to walk in hospital room?: A Little Help needed climbing 3-5 steps with a railing? : A Little 6 Click Score: 20    End of Session Equipment Utilized During Treatment: Gait belt Activity Tolerance: Patient tolerated treatment well Patient left: in chair;with call bell/phone within reach Nurse Communication: Mobility status PT Visit Diagnosis: Unsteadiness on feet (R26.81);Other abnormalities of gait and mobility (R26.89);Difficulty in walking, not elsewhere classified (R26.2);Other symptoms and signs  involving the nervous system (R29.898)     Time: 8669-8651 PT Time Calculation (min) (ACUTE ONLY): 18 min  Charges:    $Gait Training: 8-22 mins PT General Charges $$ ACUTE PT VISIT: 1 Visit                    Darryle George, PTA Acute Rehabilitation Services Secure Chat Preferred  [phone]    Darryle George 09/07/2024, 2:15 PM

## 2024-09-07 NOTE — Progress Notes (Signed)
 Progress Note   Patient: Irish Piech FMW:[id number] DOB: 10-May-1956 DOA: 09/02/2024     0 DOS: the patient was seen and examined on 09/07/2024   Brief hospital course: Maliq Pilley is a 68 y.o. male with hx of old stroke, ensuing dementia, multiple TIAs, cirrhosis, diabetes, hyperlipidemia, prior history of hepatic encephalopathy, tobacco use, sleep apnea, who lives with a caretaker, brought in for evaluation of sudden onset of confusion and left-sided neglect on EMS exam Presumably last known well was 11 AM 09/02/2024.  He was having difficulty putting on his coat and doing his day-to-day activities that he is able to do otherwise without a problem.  When EMS was called, they assessed him, he did appear confused, he had some aphasia and also had some left sided neglect for them, leading them to activate a code stroke.  He was admitted under hospital service, neurology consulted.   Assessment and Plan: Acute metabolic encephalopathy secondary to acute ischemic stroke: Initial CT head negative, CTA head and neck negative for LVO, MRI brain eventually showed small acute or early subacute infarction involving posterior right frontal lobe subcortical white matter.   He is continued on DAPT, statin. Echo ruled out PFO.   Seen by PT OT who recommend CIR. But as he was denied for CIR recommendation is for SNF.  TOC working on placement.   Essential hypertension:  Resumed home amlodipine .   Type II obese mellitus, non-insulin -dependent:  Hold metformin .  Blood sugar has been very well-controlled, discontinued SSI.   Hyperlipidemia: LDL 58, continue atorvastatin  40 mg.   Tobacco abuse:  Discussed ill effects of tobacco, encouraged him to quit.   Chronic thrombocytopenia: Stable.     Out of bed to chair. Incentive spirometry. Nursing supportive care. Fall, aspiration precautions. Diet:  Diet Orders (From admission, onward)     Start     Ordered   09/03/24 1116  Diet Heart Room  service appropriate? Yes; Fluid consistency: Thin  Diet effective now       Question Answer Comment  Room service appropriate? Yes   Fluid consistency: Thin      09/03/24 1115           DVT prophylaxis: heparin  injection 5,000 Units Start: 09/03/24 0600  Level of care: Telemetry   Code Status: Full Code  Subjective: Patient is seen and examined today morning. He is walking in hallway. Denies any complaints. He is awaiting placement.  Physical Exam: Vitals:   09/07/24 0043 09/07/24 0450 09/07/24 0756 09/07/24 1240  BP: 135/75 (!) 153/85 121/78 (!) 140/79  Pulse: 71 70 64 80  Resp: 18 18 16 16   Temp: 98.1 F (36.7 C) 97.9 F (36.6 C) 97.9 F (36.6 C) 97.6 F (36.4 C)  TempSrc:      SpO2: 96% 97% 98% 97%  Weight:      Height:        General - Elderly Caucasian male, no apparent distress HEENT - PERRLA, EOMI, atraumatic head, non tender sinuses. Lung - Clear, basal rales,no  rhonchi, wheezes. Heart - S1, S2 heard, no murmurs, rubs, trace pedal edema. Abdomen - Soft, non tender, bowel sounds good Neuro - Alert, awake and oriented, non focal exam. Skin - Warm and dry.  Data Reviewed:      Latest Ref Rng & Units 09/06/2024   11:31 AM 09/04/2024    5:54 AM 09/02/2024    2:41 PM  CBC  WBC 4.0 - 10.5 K/uL 4.6  3.6    Hemoglobin 13.0 -  17.0 g/dL 85.8  87.1  86.3   Hematocrit 39.0 - 52.0 % 42.9  37.8  40.0   Platelets 150 - 400 K/uL 101  79        Latest Ref Rng & Units 09/07/2024   10:48 AM 09/06/2024   11:31 AM 09/02/2024    2:41 PM  BMP  Glucose 70 - 99 mg/dL  886  868   BUN 8 - 23 mg/dL  16  5   Creatinine 9.38 - 1.24 mg/dL  9.16  9.09   Sodium 864 - 145 mmol/L  140  141   Potassium 3.5 - 5.1 mmol/L 4.7  5.2  4.0   Chloride 98 - 111 mmol/L  102  101   CO2 22 - 32 mmol/L  28    Calcium  8.9 - 10.3 mg/dL  9.6     No results found.  Family Communication: Discussed with patient, understand and agree. All questions answered.  Disposition: Status is:  Inpatient Remains inpatient appropriate because: stroke, pending placement.  Planned Discharge Destination: Skilled nursing facility     Time spent: 43 minutes  Author: Concepcion Riser, MD 09/07/2024 1:42 PM Secure chat 7am to 7pm For on call review [email or web address].

## 2024-09-07 NOTE — Progress Notes (Signed)
 Speech Language Pathology Treatment: Cognitive-Linguistic  Patient Details Name: Matthew Roman MRN: [id number] DOB: 05-Jul-1956 Today's Date: 09/07/2024 Time: 1100-1115 SLP Time Calculation (min) (ACUTE ONLY): 15 min  Assessment / Plan / Recommendation Clinical Impression  Skilled therapy session focused on cognitive goals. SLP facilitated session by targeting orientation questions. Patient oriented to name, however disoriented to age, location, situation and time despite maxA. SLP provided external aid and prompted use of calendar for orientation to time. Patient required modA to locate todays date and day of the week. SLP then targeted problem solving goals through prompting patient to recall steps to receive aid from nursing. Patient identified call bell, however required total A to locate correct button. SLP provided external aid to assist in recall. Patient left in chair with call bell in reach.    HPI HPI: 68 y/o M presenting to ED On 12/12 with aphasia and L neglect, code stroke activated. 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 Patient with hx of expressive aphasia seen in OP ST for x1 session before requesting d/c.      SLP Plan  Continue with current plan of care    Recommendations        Frequent or constant Supervision/Assistance Aphasia (R47.01);Cognitive communication deficit (R41.841)     Continue with current plan of care      Licet Dunphy M.A., CCC-SLP 09/07/2024, 11:23 AM

## 2024-09-07 NOTE — TOC Progression Note (Signed)
 Transition of Care [facility]) - Progression Note    Patient Details  Name: Matthew Roman MRN: [id number] Date of Birth: August 02, 1956  [facility] of [facility]) CM/SW Contact  [name], [place] Phone Number: 09/07/2024, 10:50 AM  Clinical Narrative:     SNF insurance authorization is pending for this patient, shara ID: [id number]. Granddaughter updated. CSW will continue to monitor and update dc plan.   Expected Discharge Plan: Skilled Nursing Facility Barriers to Discharge: Insurance Authorization               Expected Discharge Plan and Services       Living arrangements for the past 2 months: Single Family Home                                       Social Drivers of Health (SDOH) Interventions SDOH Screenings   Food Insecurity: Patient Unable To Answer (09/03/2024)  Housing: Patient Unable To Answer (09/03/2024)  Transportation Needs: Patient Unable To Answer (09/03/2024)  Utilities: Patient Unable To Answer (09/03/2024)  Social Connections: Patient Unable To Answer (09/03/2024)  Tobacco Use: High Risk (09/03/2024)    Readmission Risk Interventions     No data to display

## 2024-09-07 NOTE — Plan of Care (Signed)

## 2024-09-08 DIAGNOSIS — E782 Mixed hyperlipidemia: Secondary | ICD-10-CM | POA: Diagnosis not present

## 2024-09-08 DIAGNOSIS — G9341 Metabolic encephalopathy: Secondary | ICD-10-CM | POA: Diagnosis not present

## 2024-09-08 DIAGNOSIS — I10 Essential (primary) hypertension: Secondary | ICD-10-CM | POA: Diagnosis not present

## 2024-09-08 DIAGNOSIS — I639 Cerebral infarction, unspecified: Secondary | ICD-10-CM | POA: Diagnosis not present

## 2024-09-08 NOTE — Progress Notes (Signed)
 Progress Note   Patient: Matthew Roman FMW:[id number] DOB: [birth year] DOA: 09/02/2024     1 DOS: the patient was seen and examined on 09/08/2024   Brief hospital course: Lyle Niblett is a 68 y.o. male with hx of old stroke, ensuing dementia, multiple TIAs, cirrhosis, diabetes, hyperlipidemia, prior history of hepatic encephalopathy, tobacco use, sleep apnea, who lives with a caretaker, brought in for evaluation of sudden onset of confusion and left-sided neglect on EMS exam Presumably last known well was 11 AM 09/02/2024.  He was having difficulty putting on his coat and doing his day-to-day activities that he is able to do otherwise without a problem.  When EMS was called, they assessed him, he did appear confused, he had some aphasia and also had some left sided neglect for them, leading them to activate a code stroke.  He was admitted under hospital service, neurology consulted.  MRI brain showed early sub acute stroke. Neurology advised DAPT, statin. H eis awaiting SNF placement, auth pending.  Assessment and Plan: Acute metabolic encephalopathy secondary to acute ischemic stroke: Initial CT head negative, CTA head and neck negative for LVO, MRI brain eventually showed small acute or early subacute infarction involving posterior right frontal lobe subcortical white matter.   He is continued on DAPT, statin. Echo ruled out PFO.   Seen by PT OT who recommend CIR. But as he was denied for CIR recommendation is for SNF.  TOC working on placement. SNF auth pending.   Essential hypertension:  Resumed home amlodipine .   Type II obese mellitus, non-insulin -dependent:  Hold metformin .  Blood sugar has been very well-controlled, discontinued SSI.   Hyperlipidemia: Continue atorvastatin  40 mg.   Tobacco abuse:  Discussed ill effects of tobacco, encouraged him to quit.   Chronic thrombocytopenia: Stable.     Out of bed to chair. Incentive spirometry. Nursing supportive care. Fall,  aspiration precautions. Diet:  Diet Orders (From admission, onward)     Start     Ordered   09/03/24 1116  Diet Heart Room service appropriate? Yes; Fluid consistency: Thin  Diet effective now       Question Answer Comment  Room service appropriate? Yes   Fluid consistency: Thin      09/03/24 1115           DVT prophylaxis: heparin  injection 5,000 Units Start: 09/03/24 0600  Level of care: Telemetry   Code Status: Full Code  Subjective: Patient is seen and examined today morning. He is out of bed, nurse tech at bedside assisting him. Has on and off dizziness, also reported urinary urgency.   Physical Exam: Vitals:   09/08/24 0501 09/08/24 0845 09/08/24 0926 09/08/24 1132  BP: (!) 172/89  138/76 126/86  Pulse: 74 76 96 70  Resp:  18  19  Temp: 97.9 F (36.6 C)   97.8 F (36.6 C)  TempSrc:      SpO2: 98% 92%  96%  Weight:      Height:        General - Elderly Caucasian male, no apparent distress HEENT - PERRLA, EOMI, atraumatic head, non tender sinuses. Lung - Clear, basal rales,no  rhonchi, wheezes. Heart - S1, S2 heard, no murmurs, rubs, trace pedal edema. Abdomen - Soft, non tender, bowel sounds good Neuro - Alert, awake and oriented, non focal exam. Skin - Warm and dry.  Data Reviewed:      Latest Ref Rng & Units 09/06/2024   11:31 AM 09/04/2024    5:54 AM 09/02/2024  2:41 PM  CBC  WBC 4.0 - 10.5 K/uL 4.6  3.6    Hemoglobin 13.0 - 17.0 g/dL 85.8  87.1  86.3   Hematocrit 39.0 - 52.0 % 42.9  37.8  40.0   Platelets 150 - 400 K/uL 101  79        Latest Ref Rng & Units 09/07/2024   10:48 AM 09/06/2024   11:31 AM 09/02/2024    2:41 PM  BMP  Glucose 70 - 99 mg/dL  886  868   BUN 8 - 23 mg/dL  16  5   Creatinine 9.38 - 1.24 mg/dL  9.16  9.09   Sodium 864 - 145 mmol/L  140  141   Potassium 3.5 - 5.1 mmol/L 4.7  5.2  4.0   Chloride 98 - 111 mmol/L  102  101   CO2 22 - 32 mmol/L  28    Calcium  8.9 - 10.3 mg/dL  9.6     No results found.  Family  Communication: Discussed with patient, understand and agree. All questions answered.  Disposition: Status is: Inpatient Remains inpatient appropriate because: stroke, pending placement.  Planned Discharge Destination: Skilled nursing facility     Time spent: 44 minutes  Author: Concepcion Riser, MD 09/08/2024 3:39 PM Secure chat 7am to 7pm For on call review [email or web address].

## 2024-09-08 NOTE — Plan of Care (Signed)
°  Problem: Education: Goal: Knowledge of disease or condition will improve Outcome: Progressing Goal: Knowledge of secondary prevention will improve (MUST DOCUMENT ALL) Outcome: Progressing Goal: Knowledge of patient specific risk factors will improve (DELETE if not current risk factor) Outcome: Progressing   Problem: Ischemic Stroke/TIA Tissue Perfusion: Goal: Complications of ischemic stroke/TIA will be minimized Outcome: Progressing   Problem: Coping: Goal: Will verbalize positive feelings about self Outcome: Progressing Goal: Will identify appropriate support needs Outcome: Progressing   Problem: Health Behavior/Discharge Planning: Goal: Ability to manage health-related needs will improve Outcome: Progressing Goal: Goals will be collaboratively established with patient/family Outcome: Progressing   Problem: Education: Goal: Ability to describe self-care measures that may prevent or decrease complications (Diabetes Survival Skills Education) will improve Outcome: Progressing Goal: Individualized Educational Video(s) Outcome: Progressing   Problem: Coping: Goal: Ability to adjust to condition or change in health will improve Outcome: Progressing   Problem: Metabolic: Goal: Ability to maintain appropriate glucose levels will improve Outcome: Progressing   Problem: Nutritional: Goal: Maintenance of adequate nutrition will improve Outcome: Progressing   Problem: Health Behavior/Discharge Planning: Goal: Ability to manage health-related needs will improve Outcome: Progressing   Problem: Clinical Measurements: Goal: Ability to maintain clinical measurements within normal limits will improve Outcome: Progressing Goal: Will remain free from infection Outcome: Progressing Goal: Diagnostic test results will improve Outcome: Progressing Goal: Respiratory complications will improve Outcome: Progressing Goal: Cardiovascular complication will be avoided Outcome:  Progressing   Problem: Nutrition: Goal: Adequate nutrition will be maintained Outcome: Progressing   Problem: Safety: Goal: Ability to remain free from injury will improve Outcome: Progressing   Problem: Skin Integrity: Goal: Risk for impaired skin integrity will decrease Outcome: Progressing

## 2024-09-08 NOTE — Plan of Care (Signed)

## 2024-09-08 NOTE — TOC Progression Note (Signed)
 Transition of Care [facility]) - Progression Note    Patient Details  Name: Matthew Roman MRN: [id number] Date of Birth: 1956/01/30  [facility]) CM/SW Contact  [name], [place] Phone Number: 09/08/2024, 2:36 PM  Clinical Narrative:     Patient's insurance shara is still pending at this time. CSW will continue to monitor and update dc plan.   Expected Discharge Plan: Skilled Nursing Facility Barriers to Discharge: Insurance Authorization               Expected Discharge Plan and Services       Living arrangements for the past 2 months: Single Family Home                                       Social Drivers of Health (SDOH) Interventions SDOH Screenings   Food Insecurity: Patient Unable To Answer (09/03/2024)  Housing: Patient Unable To Answer (09/03/2024)  Transportation Needs: Patient Unable To Answer (09/03/2024)  Utilities: Patient Unable To Answer (09/03/2024)  Social Connections: Patient Unable To Answer (09/03/2024)  Tobacco Use: High Risk (09/03/2024)    Readmission Risk Interventions     No data to display

## 2024-09-09 DIAGNOSIS — E782 Mixed hyperlipidemia: Secondary | ICD-10-CM | POA: Diagnosis not present

## 2024-09-09 DIAGNOSIS — G9341 Metabolic encephalopathy: Secondary | ICD-10-CM | POA: Diagnosis not present

## 2024-09-09 DIAGNOSIS — I639 Cerebral infarction, unspecified: Secondary | ICD-10-CM | POA: Diagnosis not present

## 2024-09-09 DIAGNOSIS — I10 Essential (primary) hypertension: Secondary | ICD-10-CM | POA: Diagnosis not present

## 2024-09-09 MED ORDER — ADULT MULTIVITAMIN W/MINERALS CH
1.0000 | ORAL_TABLET | Freq: Every day | ORAL | Status: AC
  Administered 2024-09-09 – 2024-10-28 (×50): 1 via ORAL
  Filled 2024-09-09 (×43): qty 1

## 2024-09-09 MED ORDER — ENSURE PLUS HIGH PROTEIN PO LIQD
237.0000 mL | Freq: Three times a day (TID) | ORAL | Status: DC
  Administered 2024-09-10 – 2024-10-03 (×39): 237 mL via ORAL

## 2024-09-09 NOTE — Plan of Care (Signed)

## 2024-09-09 NOTE — Progress Notes (Signed)
 " Progress Note   Patient: Matthew Roman FMW:[id number] DOB: [birth year] DOA: 09/02/2024     2 DOS: the patient was seen and examined on 09/09/2024   Brief hospital course: Norval Slaven is a 68 y.o. male with hx of old stroke, ensuing dementia, multiple TIAs, cirrhosis, diabetes, hyperlipidemia, prior history of hepatic encephalopathy, tobacco use, sleep apnea, who lives with a caretaker, brought in for evaluation of sudden onset of confusion and left-sided neglect on EMS exam Presumably last known well was 11 AM 09/02/2024.  He was having difficulty putting on his coat and doing his day-to-day activities that he is able to do otherwise without a problem.  When EMS was called, they assessed him, he did appear confused, he had some aphasia and also had some left sided neglect for them, leading them to activate a code stroke.  He was admitted under hospital service, neurology consulted.  MRI brain showed early sub acute stroke. Neurology advised DAPT, statin. H eis awaiting SNF placement, auth pending.  Assessment and Plan: Acute metabolic encephalopathy secondary to acute ischemic stroke: Initial CT head negative, CTA head and neck negative for LVO, MRI brain eventually showed small acute or early subacute infarction involving posterior right frontal lobe subcortical white matter.   He is continued on DAPT, statin. Echo ruled out PFO.   Seen by PT OT who recommend CIR. But as he was denied for CIR as he has no 24/7 care at home.   Perr to peer done - denied for SNF. TOC working on long term care facility..   Essential hypertension:  BP stable. He is off amlodipine .   Type II obese mellitus, non-insulin -dependent:  Hold metformin .  Blood sugar has been very well-controlled, discontinued SSI.   Hyperlipidemia: Continue atorvastatin  40 mg.   Tobacco abuse:  Discussed ill effects of tobacco, encouraged him to quit.   Chronic thrombocytopenia: Stable.  Out of bed to chair. Incentive  spirometry. Nursing supportive care. Fall, aspiration precautions. Diet:  Diet Orders (From admission, onward)     Start     Ordered   09/03/24 1116  Diet Heart Room service appropriate? Yes; Fluid consistency: Thin  Diet effective now       Question Answer Comment  Room service appropriate? Yes   Fluid consistency: Thin      09/03/24 1115           DVT prophylaxis: heparin  injection 5,000 Units Start: 09/03/24 0600  Level of care: Telemetry   Code Status: Full Code  Subjective: Patient is seen and examined today morning. He is sitting in chair, has not complaints. States he feels bored. Encouraged to talk to staff, watch tv.   Physical Exam: Vitals:   09/08/24 1554 09/08/24 1942 09/09/24 0535 09/09/24 0748  BP: (!) 109/92 (!) 152/79 120/72 128/86  Pulse: 63 70 63 68  Resp: 18   20  Temp: (!) 97.5 F (36.4 C) 98.1 F (36.7 C) 97.6 F (36.4 C) (!) 97.4 F (36.3 C)  TempSrc:    Oral  SpO2: 96% 97% 96% 96%  Weight:      Height:        General - Elderly Caucasian male, no apparent distress HEENT - PERRLA, EOMI, atraumatic head, non tender sinuses. Lung - Clear, basal rales,no  rhonchi, wheezes. Heart - S1, S2 heard, no murmurs, rubs, trace pedal edema. Abdomen - Soft, non tender, bowel sounds good Neuro - Alert, awake and oriented, non focal exam. Skin - Warm and dry.  Data Reviewed:  Latest Ref Rng & Units 09/06/2024   11:31 AM 09/04/2024    5:54 AM 09/02/2024    2:41 PM  CBC  WBC 4.0 - 10.5 K/uL 4.6  3.6    Hemoglobin 13.0 - 17.0 g/dL 85.8  87.1  86.3   Hematocrit 39.0 - 52.0 % 42.9  37.8  40.0   Platelets 150 - 400 K/uL 101  79        Latest Ref Rng & Units 09/07/2024   10:48 AM 09/06/2024   11:31 AM 09/02/2024    2:41 PM  BMP  Glucose 70 - 99 mg/dL  886  868   BUN 8 - 23 mg/dL  16  5   Creatinine 9.38 - 1.24 mg/dL  9.16  9.09   Sodium 864 - 145 mmol/L  140  141   Potassium 3.5 - 5.1 mmol/L 4.7  5.2  4.0   Chloride 98 - 111 mmol/L  102   101   CO2 22 - 32 mmol/L  28    Calcium  8.9 - 10.3 mg/dL  9.6     No results found.  Family Communication: Discussed with patient, understand and agree. All questions answered.  Disposition: Status is: Inpatient Remains inpatient appropriate because: stroke, pending placement.  Planned Discharge Destination: Skilled nursing facility     Time spent: 41 minutes  Author: Concepcion Riser, MD 09/09/2024 2:29 PM Secure chat 7am to 7pm For on call review [email or web address]    "

## 2024-09-09 NOTE — TOC Progression Note (Signed)
 Transition of Care [facility]) - Progression Note    Patient Details  Name: Matthew Roman MRN: [id number] Date of Birth: [birth year]  Transition of Care [facility]) CM/SW Contact  [name], [place] Phone Number: 09/09/2024, 5:00 PM  Clinical Narrative:     Patient's insurance offered a peer to peer due today at 3 pm. After conversation with the doctor, insurance declined SNF. CSW will discuss longterm placement and/or memory care placement with granddaughter and caregiver. CSW will continue to follow.   Expected Discharge Plan: Skilled Nursing Facility Barriers to Discharge: Insurance Authorization               Expected Discharge Plan and Services       Living arrangements for the past 2 months: Single Family Home                                       Social Drivers of Health (SDOH) Interventions SDOH Screenings   Food Insecurity: Patient Unable To Answer (09/03/2024)  Housing: Patient Unable To Answer (09/03/2024)  Transportation Needs: Patient Unable To Answer (09/03/2024)  Utilities: Patient Unable To Answer (09/03/2024)  Social Connections: Patient Unable To Answer (09/03/2024)  Tobacco Use: High Risk (09/03/2024)    Readmission Risk Interventions     No data to display

## 2024-09-09 NOTE — Progress Notes (Signed)
 Occupational Therapy Treatment Patient Details Name: Matthew Roman MRN: [id number] DOB: Jan 24, 1956 Today's Date: 09/09/2024   History of present illness 68 y/o M presenting to ED On 12/12 with aphasia and L neglect, code stroke activated. MRI with small punctate areas of restricted diffusion in R cerebral hemisphere.    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   OT comments  Patient demonstrating good gains with OT treatment with functional transfers, self care and mobility. Patient requires cues for sequencing for self care tasks and difficulty with word finding.  Patient will benefit from continued inpatient follow up therapy, <3 hours/day.  Acute OT to continue to follow to address established goals to facilitate DC to next venue of care.        If plan is discharge home, recommend the following:  A little help with walking and/or transfers;Assistance with cooking/housework;Direct supervision/assist for financial management;Direct supervision/assist for medications management;Assist for transportation;Supervision due to cognitive status;Help with stairs or ramp for entrance;A little help with bathing/dressing/bathroom   Equipment Recommendations  Other (comment) (defer)    Recommendations for Other Services      Precautions / Restrictions Precautions Precautions: Fall Recall of Precautions/Restrictions: Impaired Restrictions Weight Bearing Restrictions Per Provider Order: No       Mobility Bed Mobility Overal bed mobility: Modified Independent             General bed mobility comments: OOB upon arrival    Transfers Overall transfer level: Needs assistance Equipment used: None Transfers: Sit to/from Stand Sit to Stand: Supervision           General transfer comment: patient standing next to bed upon arrival and was supervision for safety with transfers     Balance Overall balance  assessment: Needs assistance Sitting-balance support: Feet supported Sitting balance-Leahy Scale: Good     Standing balance support: During functional activity, No upper extremity supported Standing balance-Leahy Scale: Fair Standing balance comment: supervision when standing for self care tasks                           ADL either performed or assessed with clinical judgement   ADL Overall ADL's : Needs assistance/impaired     Grooming: Wash/dry hands;Wash/dry face;Supervision/safety;Cueing for sequencing;Standing               Lower Body Dressing: Supervision/safety;Sit to/from stand;Cueing for sequencing Lower Body Dressing Details (indicate cue type and reason): donned and doffed pants with supervision and cues for squencing Toilet Transfer: Supervision/safety;Ambulation       Tub/ Shower Transfer: Education Officer, Environmental Details (indicate cue type and reason): able to step into shower without use of grab bars        Extremity/Trunk Sales Promotion Account Executive Communication: Impaired Factors Affecting Communication: Difficulty expressing self   Cognition Arousal: Alert Behavior During Therapy: Flat affect Cognition: Cognition impaired   Orientation impairments: Place, Time, Situation (unable to give correct date when given visual cues) Awareness: Intellectual awareness impaired, Online awareness impaired Memory impairment (select all impairments): Short-term memory, Working civil service fast streamer, Engineer, structural memory Attention impairment (select first level of impairment): Sustained attention Executive functioning impairment (select all impairments): Problem solving, Reasoning, Sequencing, Organization, Initiation  Following commands: Impaired Following commands impaired: Follows one step commands with increased time       Cueing   Cueing Techniques: Verbal cues, Gestural cues  Exercises      Shoulder Instructions       General Comments VSS    Pertinent Vitals/ Pain       Pain Assessment Pain Assessment: No/denies pain  Home Living                                          Prior Functioning/Environment              Frequency  Min 2X/week        Progress Toward Goals  OT Goals(current goals can now be found in the care plan section)  Progress towards OT goals: Progressing toward goals     Plan      Co-evaluation                 AM-PAC OT 6 Clicks Daily Activity     Outcome Measure   Help from another person eating meals?: A Little Help from another person taking care of personal grooming?: A Little Help from another person toileting, which includes using toliet, bedpan, or urinal?: A Little Help from another person bathing (including washing, rinsing, drying)?: A Little Help from another person to put on and taking off regular upper body clothing?: A Little Help from another person to put on and taking off regular lower body clothing?: A Little 6 Click Score: 18    End of Session Equipment Utilized During Treatment: Gait belt  OT Visit Diagnosis: Unsteadiness on feet (R26.81);Other abnormalities of gait and mobility (R26.89);Muscle weakness (generalized) (M62.81);Other symptoms and signs involving cognitive function;Cognitive communication deficit (R41.841)   Activity Tolerance Patient tolerated treatment well   Patient Left in chair;with call bell/phone within reach   Nurse Communication Mobility status        Time: 9279-9255 OT Time Calculation (min): 24 min  Charges: OT General Charges $OT Visit: 1 Visit OT Treatments $Self Care/Home Management : 8-22 mins $Therapeutic Activity: 8-22 mins  Dick Roman, OTA Acute Rehabilitation Services  Office [phone]   Matthew Roman 09/09/2024, 8:20 AM

## 2024-09-09 NOTE — Progress Notes (Signed)
 Nutrition Follow-up  DOCUMENTATION CODES:  Non-severe (moderate) malnutrition in context of social or environmental circumstances - updated  INTERVENTION:  Increase Ensure Plus High Protein PO to TID. Each supplement provides 350 Kcals and 20 grams of protein. Multivitamin PO once daily. Continue heart-healthy diet.  NUTRITION DIAGNOSIS:  Moderate Malnutrition related to social / environmental circumstances as evidenced by mild fat depletion, mild muscle depletion - updated diagnosis with new data.  GOAL:  Patient will meet greater than or equal to 90% of their needs - progressing  MONITOR:  PO intake, Supplement acceptance, Labs, Weight trends  REASON FOR ASSESSMENT:  Follow-up for: Consult Assessment of nutrition requirement/status  ASSESSMENT:  Patient presented with confusion and L-sided neglect and was found to have acute ischemic stroke. PMH significant for stroke with dementia, multiple TIAs, liver cirrhosis, DM2, dyslipidemia, hepatic encephalopathy, tobacco, OSA, lives with caretaker.  Visited the patient who states that he is eating 50-75% of his meals but not eating 3 meals a day. He has had decreased appetite for a few months prior to admission. He is unable to provide a reason or exact time frame. He is unaware of his UBW but states he could physically tell that he has lost weight. He is drinking 2 full Ensures daily and is agreeable to drinking 3 a day.  Scheduled Meds:   stroke: early stages of recovery book   Does not apply Once   aspirin   81 mg Oral Daily   atorvastatin   40 mg Oral q1800   clopidogrel   75 mg Oral Daily   famotidine   20 mg Oral BID   feeding supplement  237 mL Oral BID BM   heparin   5,000 Units Subcutaneous Q8H   sodium chloride  flush  3 mL Intravenous Once   Continuous Infusions: PRN Meds:.acetaminophen  **OR** acetaminophen  (TYLENOL ) oral liquid 160 mg/5 mL **OR** acetaminophen , albuterol   Diet Order             Diet Heart Room service  appropriate? Yes; Fluid consistency: Thin  Diet effective now                  Meal Intake: 50-75% per patient  Labs:     Latest Ref Rng & Units 09/07/2024   10:48 AM 09/06/2024   11:31 AM 09/02/2024    2:41 PM  CMP  Glucose 70 - 99 mg/dL  886  868   BUN 8 - 23 mg/dL  16  5   Creatinine 9.38 - 1.24 mg/dL  9.16  9.09   Sodium 864 - 145 mmol/L  140  141   Potassium 3.5 - 5.1 mmol/L 4.7  5.2  4.0   Chloride 98 - 111 mmol/L  102  101   CO2 22 - 32 mmol/L  28    Calcium  8.9 - 10.3 mg/dL  9.6      I/O: +7.6 L since admit  NUTRITION - FOCUSED PHYSICAL EXAM: Flowsheet Row Most Recent Value  Orbital Region No depletion  Upper Arm Region Mild depletion  Thoracic and Lumbar Region No depletion  Buccal Region Mild depletion  Temple Region Moderate depletion  Clavicle Bone Region No depletion  Clavicle and Acromion Bone Region Mild depletion  Scapular Bone Region Moderate depletion  Dorsal Hand Moderate depletion  Patellar Region Severe depletion  Anterior Thigh Region Moderate depletion  Posterior Calf Region Severe depletion  Edema (RD Assessment) None  Hair Reviewed  Eyes Reviewed  Mouth Reviewed  Skin Reviewed  Nails Reviewed    EDUCATION NEEDS:  Education needs have been addressed  Skin:  Skin Assessment: Reviewed RN Assessment  Last BM:  12/13  Height:  Ht Readings from Last 1 Encounters:  09/04/24 6' (1.829 m)   Weight:  Wt Readings from Last 10 Encounters:  09/02/24 80.2 kg  08/04/22 76.5 kg  03/07/22 76.5 kg  08/02/19 80.3 kg  10/26/18 82.7 kg  09/02/18 86.2 kg   Weight Change: patient reports unknown amount of weight loss PTA  Usual Body Weight: patient is unable to determine  Edema: none  Ideal Body Weight:  81 kg   BMI:  Body mass index is 23.98 kg/m.  Estimated Daily Nutritional Needs:  Kcal:  2000-2200 Protein:  100-120 g Fluid:  >/=2000 mL    Leverne Ruth, MS, RDN, LDN [place]. [facility] See AMION for contact  information Secure chat preferred

## 2024-09-09 NOTE — Plan of Care (Signed)
" °  Problem: Education: Goal: Knowledge of disease or condition will improve Outcome: Progressing Goal: Knowledge of secondary prevention will improve (MUST DOCUMENT ALL) Outcome: Progressing Goal: Knowledge of patient specific risk factors will improve (DELETE if not current risk factor) Outcome: Progressing   Problem: Ischemic Stroke/TIA Tissue Perfusion: Goal: Complications of ischemic stroke/TIA will be minimized Outcome: Progressing   Problem: Coping: Goal: Will verbalize positive feelings about self Outcome: Progressing Goal: Will identify appropriate support needs Outcome: Progressing   Problem: Health Behavior/Discharge Planning: Goal: Goals will be collaboratively established with patient/family Outcome: Progressing   Problem: Self-Care: Goal: Ability to participate in self-care as condition permits will improve Outcome: Progressing Goal: Verbalization of feelings and concerns over difficulty with self-care will improve Outcome: Progressing Goal: Ability to communicate needs accurately will improve Outcome: Progressing   Problem: Fluid Volume: Goal: Ability to maintain a balanced intake and output will improve Outcome: Progressing   Problem: Health Behavior/Discharge Planning: Goal: Ability to identify and utilize available resources and services will improve Outcome: Progressing Goal: Ability to manage health-related needs will improve Outcome: Progressing   Problem: Education: Goal: Knowledge of General Education information will improve Description: Including pain rating scale, medication(s)/side effects and non-pharmacologic comfort measures Outcome: Progressing   Problem: Coping: Goal: Level of anxiety will decrease Outcome: Progressing   "

## 2024-09-10 DIAGNOSIS — I639 Cerebral infarction, unspecified: Secondary | ICD-10-CM | POA: Diagnosis not present

## 2024-09-10 DIAGNOSIS — I10 Essential (primary) hypertension: Secondary | ICD-10-CM | POA: Diagnosis not present

## 2024-09-10 DIAGNOSIS — E782 Mixed hyperlipidemia: Secondary | ICD-10-CM | POA: Diagnosis not present

## 2024-09-10 DIAGNOSIS — G9341 Metabolic encephalopathy: Secondary | ICD-10-CM | POA: Diagnosis not present

## 2024-09-10 DIAGNOSIS — E440 Moderate protein-calorie malnutrition: Secondary | ICD-10-CM | POA: Insufficient documentation

## 2024-09-10 NOTE — Plan of Care (Signed)

## 2024-09-10 NOTE — Progress Notes (Signed)
 " Progress Note   Patient: Matthew Roman FMW:[id number] DOB: 1956-08-26 DOA: 09/02/2024     3 DOS: the patient was seen and examined on 09/10/2024   Brief hospital course: Ajai Harville is a 68 y.o. male with hx of old stroke, ensuing dementia, multiple TIAs, cirrhosis, diabetes, hyperlipidemia, prior history of hepatic encephalopathy, tobacco use, sleep apnea, who lives with a caretaker, brought in for evaluation of sudden onset of confusion and left-sided neglect on EMS exam Presumably last known well was 11 AM 09/02/2024.  He was having difficulty putting on his coat and doing his day-to-day activities that he is able to do otherwise without a problem.  When EMS was called, they assessed him, he did appear confused, he had some aphasia and also had some left sided neglect for them, leading them to activate a code stroke.  He was admitted under hospital service, neurology consulted.  MRI brain showed early sub acute stroke. Neurology advised DAPT, statin. SNF denied by insurance, awaiting safe disposition.  Assessment and Plan: Acute metabolic encephalopathy secondary to acute ischemic stroke: Initial CT head negative, CTA head and neck negative for LVO, MRI brain eventually showed small acute or early subacute infarction involving posterior right frontal lobe subcortical white matter.   He is continued on DAPT, statin. Echo ruled out PFO.   Seen by PT OT who recommend CIR. But as he was denied for CIR as he has no 24/7 care at home.   Perr to peer done - denied for SNF. TOC [facility]   Essential hypertension:  BP stable. He is off amlodipine .   Type II obese mellitus, non-insulin -dependent:  Hold metformin .  Blood sugar has been very well-controlled, discontinued SSI.   Hyperlipidemia: Continue atorvastatin  40 mg.   Tobacco abuse:  Discussed ill effects of tobacco, encouraged him to quit.   Chronic thrombocytopenia: Stable.  Out of bed to chair.  Incentive spirometry. Nursing supportive care. Fall, aspiration precautions. Diet:  Diet Orders (From admission, onward)     Start     Ordered   09/03/24 1116  Diet Heart Room service appropriate? Yes; Fluid consistency: Thin  Diet effective now       Question Answer Comment  Room service appropriate? Yes   Fluid consistency: Thin      09/03/24 1115           DVT prophylaxis: heparin  injection 5,000 Units Start: 09/03/24 0600  Level of care: Telemetry   Code Status: Full Code  Subjective: Patient is seen and examined today morning. He is sitting in chair, denies any complaints. Wishes to walk with help.   Physical Exam: Vitals:   09/09/24 2014 09/09/24 2355 09/10/24 0522 09/10/24 0746  BP: (!) 146/81 (!) 144/78 (!) 146/80 (!) 143/77  Pulse: 69 72 84 72  Resp:    20  Temp: (!) 97.4 F (36.3 C) 98.3 F (36.8 C) (!) 97.1 F (36.2 C) 97.8 F (36.6 C)  TempSrc: Oral   Oral  SpO2: 96% 96% 96% 96%  Weight:      Height:        General - Elderly Caucasian male, no apparent distress HEENT - PERRLA, EOMI, atraumatic head, non tender sinuses. Lung - Clear, basal rales,no  rhonchi, wheezes. Heart - S1, S2 heard, no murmurs, rubs, trace pedal edema. Abdomen - Soft, non tender, bowel sounds good Neuro - Alert, awake and oriented, non focal exam. Skin - Warm and dry.  Data Reviewed:  Latest Ref Rng & Units 09/06/2024   11:31 AM 09/04/2024    5:54 AM 09/02/2024    2:41 PM  CBC  WBC 4.0 - 10.5 K/uL 4.6  3.6    Hemoglobin 13.0 - 17.0 g/dL 85.8  87.1  86.3   Hematocrit 39.0 - 52.0 % 42.9  37.8  40.0   Platelets 150 - 400 K/uL 101  79        Latest Ref Rng & Units 09/07/2024   10:48 AM 09/06/2024   11:31 AM 09/02/2024    2:41 PM  BMP  Glucose 70 - 99 mg/dL  886  868   BUN 8 - 23 mg/dL  16  5   Creatinine 9.38 - 1.24 mg/dL  9.16  9.09   Sodium 864 - 145 mmol/L  140  141   Potassium 3.5 - 5.1 mmol/L 4.7  5.2  4.0   Chloride 98 - 111 mmol/L  102  101   CO2 22  - 32 mmol/L  28    Calcium  8.9 - 10.3 mg/dL  9.6     No results found.  Family Communication: Discussed with patient, understand and agree. All questions answered.  Disposition: Status is: Inpatient Remains inpatient appropriate because: stroke, pending placement.  Planned Discharge Destination: Skilled nursing facility     Time spent: 40 minutes  Author: Concepcion Riser, MD 09/10/2024 2:56 PM Secure chat 7am to 7pm For on call review [email or web address]    "

## 2024-09-10 NOTE — Plan of Care (Signed)
" °  Problem: Education: Goal: Knowledge of disease or condition will improve Outcome: Progressing Goal: Knowledge of secondary prevention will improve (MUST DOCUMENT ALL) Outcome: Progressing Goal: Knowledge of patient specific risk factors will improve (DELETE if not current risk factor) Outcome: Progressing   Problem: Ischemic Stroke/TIA Tissue Perfusion: Goal: Complications of ischemic stroke/TIA will be minimized Outcome: Progressing   Problem: Coping: Goal: Will verbalize positive feelings about self Outcome: Progressing   Problem: Education: Goal: Ability to describe self-care measures that may prevent or decrease complications (Diabetes Survival Skills Education) will improve Outcome: Progressing Goal: Individualized Educational Video(s) Outcome: Progressing   Problem: Nutrition: Goal: Risk of aspiration will decrease Outcome: Progressing Goal: Dietary intake will improve Outcome: Progressing   Problem: Nutritional: Goal: Maintenance of adequate nutrition will improve Outcome: Progressing Goal: Progress toward achieving an optimal weight will improve Outcome: Progressing   Problem: Skin Integrity: Goal: Risk for impaired skin integrity will decrease Outcome: Progressing   Problem: Clinical Measurements: Goal: Respiratory complications will improve Outcome: Progressing   "

## 2024-09-11 DIAGNOSIS — I10 Essential (primary) hypertension: Secondary | ICD-10-CM | POA: Diagnosis not present

## 2024-09-11 DIAGNOSIS — E782 Mixed hyperlipidemia: Secondary | ICD-10-CM | POA: Diagnosis not present

## 2024-09-11 DIAGNOSIS — G9341 Metabolic encephalopathy: Secondary | ICD-10-CM | POA: Diagnosis not present

## 2024-09-11 DIAGNOSIS — I639 Cerebral infarction, unspecified: Secondary | ICD-10-CM | POA: Diagnosis not present

## 2024-09-11 DIAGNOSIS — Z720 Tobacco use: Secondary | ICD-10-CM | POA: Diagnosis not present

## 2024-09-11 MED ORDER — DOCUSATE SODIUM 100 MG PO CAPS
100.0000 mg | ORAL_CAPSULE | Freq: Two times a day (BID) | ORAL | Status: AC
  Administered 2024-09-11 – 2024-10-28 (×75): 100 mg via ORAL
  Filled 2024-09-11 (×75): qty 1

## 2024-09-11 MED ORDER — POLYETHYLENE GLYCOL 3350 17 G PO PACK
17.0000 g | PACK | Freq: Every day | ORAL | Status: AC
  Administered 2024-09-11 – 2024-10-16 (×24): 17 g via ORAL
  Filled 2024-09-11 (×39): qty 1

## 2024-09-11 MED ORDER — SENNOSIDES-DOCUSATE SODIUM 8.6-50 MG PO TABS: 2.0000 | ORAL_TABLET | Freq: Every day | ORAL | Status: DC

## 2024-09-11 MED ORDER — SENNA 8.6 MG PO TABS
2.0000 | ORAL_TABLET | Freq: Every day | ORAL | Status: AC
  Administered 2024-09-11 – 2024-10-28 (×32): 17.2 mg via ORAL
  Filled 2024-09-11 (×34): qty 2

## 2024-09-11 NOTE — Progress Notes (Signed)
 " Progress Note   Patient: Matthew Roman DOB: 1955-10-03 DOA: 09/02/2024     4 DOS: the patient was seen and examined on 09/11/2024   Brief hospital course: Matthew Roman is a 69 y.o. male with hx of old stroke, ensuing dementia, multiple TIAs, cirrhosis, diabetes, hyperlipidemia, prior history of hepatic encephalopathy, tobacco use, sleep apnea, who lives with a caretaker, brought in for evaluation of sudden onset of confusion and left-sided neglect on EMS exam Presumably last known well was 11 AM 09/02/2024.  He was having difficulty putting on his coat and doing his day-to-day activities that he is able to do otherwise without a problem.  When EMS was called, they assessed him, he did appear confused, he had some aphasia and also had some left sided neglect for them, leading them to activate a code stroke.  He was admitted under hospital service, neurology consulted.  MRI brain showed early sub acute stroke. Neurology advised DAPT, statin. SNF denied by insurance, awaiting safe disposition.  Assessment and Plan: Acute metabolic encephalopathy secondary to acute ischemic stroke: Initial CT head negative, CTA head and neck negative for LVO, MRI brain eventually showed small acute or early subacute infarction involving posterior right frontal lobe subcortical white matter.   He is continued on DAPT, statin. Echo ruled out PFO.   Seen by PT OT who recommend CIR. But as he was denied for CIR as he has no 24/7 care at home.   Perr to peer done - denied for SNF. [facility]OC [facility]. He wishes to go home.   Essential hypertension:  BP stable. Not on any antihypertensives.   Type II obese mellitus, non-insulin -dependent:  Hold metformin .  Blood sugars well-controlled, discontinued SSI.   Hyperlipidemia: Continue atorvastatin  40 mg.   Tobacco abuse:  Discussed ill effects of tobacco, encouraged him to quit.   Chronic thrombocytopenia: Stable.  Out of  bed to chair. Incentive spirometry. Nursing supportive care. Fall, aspiration precautions. Diet:  Diet Orders (From admission, onward)     Start     Ordered   09/03/24 1116  Diet Heart Room service appropriate? Yes; Fluid consistency: Thin  Diet effective now       Question Answer Comment  Room service appropriate? Yes   Fluid consistency: Thin      09/03/24 1115           DVT prophylaxis: heparin  injection 5,000 Units Start: 09/03/24 0600  Level of care: Telemetry   Code Status: Full Code  Subjective: Patient is seen and examined today morning. He is walking in hallway. Denies any complaints. RN noted he is constipated.  Physical Exam: Vitals:   09/10/24 2108 09/10/24 2323 09/11/24 0417 09/11/24 0825  BP: (!) 122/55 106/63 133/75 126/86  Pulse: 71 68 79 100  Resp: 18 20 18 20   Temp: 98.2 F (36.8 C) 98.1 F (36.7 C) 98 F (36.7 C) 97.7 F (36.5 C)  TempSrc:  Oral Oral Oral  SpO2: 97% 96% 96% 95%  Weight:      Height:        General - Elderly Caucasian male, no apparent distress HEENT - PERRLA, EOMI, atraumatic head, non tender sinuses. Lung - Clear, basal rales,no  rhonchi, wheezes. Heart - S1, S2 heard, no murmurs, rubs, trace pedal edema. Abdomen - Soft, non tender, bowel sounds good Neuro - Alert, awake and oriented, non focal exam. Skin - Warm and dry.  Data Reviewed:      Latest Ref  Rng & Units 09/06/2024   11:31 AM 09/04/2024    5:54 AM 09/02/2024    2:41 PM  CBC  WBC 4.0 - 10.5 K/uL 4.6  3.6    Hemoglobin 13.0 - 17.0 g/dL 85.8  87.1  86.3   Hematocrit 39.0 - 52.0 % 42.9  37.8  40.0   Platelets 150 - 400 K/uL 101  79        Latest Ref Rng & Units 09/07/2024   10:48 AM 09/06/2024   11:31 AM 09/02/2024    2:41 PM  BMP  Glucose 70 - 99 mg/dL  886  868   BUN 8 - 23 mg/dL  16  5   Creatinine 9.38 - 1.24 mg/dL  9.16  9.09   Sodium 864 - 145 mmol/L  140  141   Potassium 3.5 - 5.1 mmol/L 4.7  5.2  4.0   Chloride 98 - 111 mmol/L  102  101    CO2 22 - 32 mmol/L  28    Calcium  8.9 - 10.3 mg/dL  9.6     No results found.  Family Communication: Discussed with patient, understand and agree. All questions answered.  Disposition: Status is: Inpatient Remains inpatient appropriate because: stroke, pending placement.  Planned Discharge Destination: Skilled nursing facility     Time spent: 42 minutes  Author: Concepcion Riser, MD 09/11/2024 11:57 AM Secure chat 7am to 7pm For on call review [email or web address].    "

## 2024-09-11 NOTE — Plan of Care (Signed)
" °  Problem: Ischemic Stroke/TIA Tissue Perfusion: Goal: Complications of ischemic stroke/TIA will be minimized Outcome: Progressing   Problem: Self-Care: Goal: Ability to participate in self-care as condition permits will improve Outcome: Progressing   Problem: Safety: Goal: Ability to remain free from injury will improve Outcome: Progressing   "

## 2024-09-12 DIAGNOSIS — I10 Essential (primary) hypertension: Secondary | ICD-10-CM | POA: Diagnosis not present

## 2024-09-12 DIAGNOSIS — G9341 Metabolic encephalopathy: Secondary | ICD-10-CM | POA: Diagnosis not present

## 2024-09-12 DIAGNOSIS — I639 Cerebral infarction, unspecified: Secondary | ICD-10-CM | POA: Diagnosis not present

## 2024-09-12 DIAGNOSIS — E782 Mixed hyperlipidemia: Secondary | ICD-10-CM | POA: Diagnosis not present

## 2024-09-12 NOTE — Plan of Care (Signed)

## 2024-09-12 NOTE — Progress Notes (Signed)
 Occupational Therapy Treatment Patient Details Name: Matthew Roman MRN: [id number] DOB: [birth year] Today's Date: 09/12/2024   History of present illness 68 y/o M presenting to ED On 12/12 with aphasia and L neglect, code stroke activated. MRI with small punctate areas of restricted diffusion in R cerebral hemisphere.    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   OT comments  Pt seen for OT tx this PM. Pt remains with significant cognitive impairments. Reported his birthday comes on Wednesdays and is this upcoming Wed (Christmas eve); of note birthday is in January. Pt overall supervision/CGA for all ambulation and transfers. Demo's extremely slow pace and drags feet despite cues to correct. SUP for UB/LB ADLs standing in room. Max cues for recalling and locating 3/3 items locate in hallway. Improved wayfinding this date. Continues to be highly limited by cog deficits. Recs remain appropriate for < 3 hrs/day post-acute rehab. OT to continue to follow.      If plan is discharge home, recommend the following:  A little help with walking and/or transfers;Assistance with cooking/housework;Direct supervision/assist for financial management;Direct supervision/assist for medications management;Assist for transportation;Supervision due to cognitive status;Help with stairs or ramp for entrance;A little help with bathing/dressing/bathroom   Equipment Recommendations  Other (comment) (defer)    Recommendations for Other Services      Precautions / Restrictions Precautions Precautions: Fall Recall of Precautions/Restrictions: Impaired Restrictions Weight Bearing Restrictions Per Provider Order: No       Mobility Bed Mobility               General bed mobility comments: NT - pt received and left OOB throughout session    Transfers Overall transfer level: Needs assistance Equipment used: None Transfers: Sit  to/from Stand Sit to Stand: Supervision           General transfer comment: stood from chair, reliant on arm rests to power up     Balance Overall balance assessment: Needs assistance Sitting-balance support: Feet supported Sitting balance-Leahy Scale: Normal Sitting balance - Comments: sitting unsupported in recliner chair without overt LOB noted   Standing balance support: During functional activity Standing balance-Leahy Scale: Fair Standing balance comment: no overt LOB observed; very slow pace during mobility                           ADL either performed or assessed with clinical judgement   ADL Overall ADL's : Needs assistance/impaired     Grooming: Supervision/safety;Wash/dry hands;Standing;Cueing for sequencing Grooming Details (indicate cue type and reason): sinkside             Lower Body Dressing: Set up;Supervision/safety;Sitting/lateral leans Lower Body Dressing Details (indicate cue type and reason): adjusting shoes             Functional mobility during ADLs: Supervision/safety;Contact guard assist      Extremity/Trunk Assessment              Vision       Perception     Praxis     Communication Communication Communication: No apparent difficulties   Cognition Arousal: Alert Behavior During Therapy: Flat affect Cognition: Cognition impaired   Orientation impairments: Place, Time, Situation Awareness: Intellectual awareness impaired, Online awareness impaired Memory impairment (select all impairments): Short-term memory, Working civil service fast streamer, Conservation officer, historic buildings Attention impairment (select first level of impairment): Sustained attention Executive functioning impairment (select all impairments): Problem solving, Reasoning, Sequencing, Organization,  Initiation OT - Cognition Comments: pt stating his birthday comes on Wednesdays, reporting it was this Thursday (Christmas eve); max cues for locating and recalling 3/3  items to locate in hallway                 Following commands: Impaired Following commands impaired: Follows one step commands with increased time      Cueing   Cueing Techniques: Verbal cues, Gestural cues  Exercises Other Exercises Other Exercises: 1x20 shoulder flexion and elbow flexion/extension as warm-up for functional activities Other Exercises: wayfinding in hallway, needs max A and cueing to recall and locate 3/3 items located in hallway    Shoulder Instructions       General Comments participatory    Pertinent Vitals/ Pain       Pain Assessment Pain Assessment: No/denies pain  Home Living                                          Prior Functioning/Environment              Frequency  Min 2X/week        Progress Toward Goals  OT Goals(current goals can now be found in the care plan section)  Progress towards OT goals: Progressing toward goals     Plan      Co-evaluation                 AM-PAC OT 6 Clicks Daily Activity     Outcome Measure   Help from another person eating meals?: None Help from another person taking care of personal grooming?: A Little Help from another person toileting, which includes using toliet, bedpan, or urinal?: A Little Help from another person bathing (including washing, rinsing, drying)?: A Little Help from another person to put on and taking off regular upper body clothing?: A Little Help from another person to put on and taking off regular lower body clothing?: A Little 6 Click Score: 19    End of Session Equipment Utilized During Treatment: Gait belt  OT Visit Diagnosis: Unsteadiness on feet (R26.81);Other abnormalities of gait and mobility (R26.89);Muscle weakness (generalized) (M62.81);Other symptoms and signs involving cognitive function   Activity Tolerance Patient tolerated treatment well   Patient Left in chair;with call bell/phone within reach   Nurse Communication  Mobility status        Time: 8386-8370 OT Time Calculation (min): 16 min  Charges: OT General Charges $OT Visit: 1 Visit OT Treatments $Therapeutic Activity: 8-22 mins  [name] M. [place], OTR/L [facility] Acute Rehabilitation Services [phone] Secure Chat Preferred  [name] 09/12/2024, 5:15 PM

## 2024-09-12 NOTE — Progress Notes (Signed)
 Physical Therapy Treatment Patient Details Name: Matthew Roman MRN: [id number] DOB: [birth year] Today's Date: 09/12/2024   History of Present Illness 68 y/o M presenting to ED On 12/12 with aphasia and L neglect, code stroke activated. MRI with small punctate areas of restricted diffusion in R cerebral hemisphere.    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    PT Comments  Pt is slowly progressing towards goals. Currently pt score 31/54 on the BERG balance test demonstrating a high risk for falls. Pt continues with 11/24 on the DGI placing pt at high risk for falls with any challenge with gait. Pt has poor safety awareness and is unsure why he is at the hospital or where he is at this time. Pt Currently is supervision to Min A for activities including bed mobility, sit to stand and gait. Due to pt current functional status, home set up and available assistance at home recommending skilled physical therapy services > 3 hours/day in order to address strength, balance and functional mobility to decrease risk for falls, injury, immobility, skin break down and re-hospitalization.     If plan is discharge home, recommend the following: A little help with walking and/or transfers;A little help with bathing/dressing/bathroom;Assistance with cooking/housework;Direct supervision/assist for medications management;Direct supervision/assist for financial management;Assist for transportation;Help with stairs or ramp for entrance;Supervision due to cognitive status     Equipment Recommendations  None recommended by PT       Precautions / Restrictions Precautions Precautions: Fall Recall of Precautions/Restrictions: Impaired Restrictions Weight Bearing Restrictions Per Provider Order: No     Mobility  Bed Mobility Overal bed mobility: Modified Independent        Transfers Overall transfer level: Needs assistance Equipment  used: None Transfers: Sit to/from Stand Sit to Stand: Supervision      Ambulation/Gait Ambulation/Gait assistance: Contact guard assist, Min assist   Assistive device: None Gait Pattern/deviations: Step-through pattern, Decreased stance time - left, Decreased stride length, Shuffle Gait velocity: decreased Gait velocity interpretation: <1.31 ft/sec, indicative of household ambulator   General Gait Details: Very slow pace with shuffling gait and intermittent LOB requiring MIn A for balance. Cues throughout for larger higher steps with poor carry over and very brief intervals of improvement. DGI performed.     Balance Overall balance assessment: Needs assistance Sitting-balance support: Feet supported Sitting balance-Leahy Scale: Good     Standing balance support: During functional activity, No upper extremity supported   Standardized Balance Assessment Standardized Balance Assessment : Dynamic Gait Index, Berg Balance Test Berg Balance Test Sit to Stand: Able to stand  independently using hands Standing Unsupported: Able to stand safely 2 minutes Sitting with Back Unsupported but Feet Supported on Floor or Stool: Able to sit safely and securely 2 minutes Stand to Sit: Controls descent by using hands Transfers: Able to transfer with verbal cueing and /or supervision Standing Unsupported with Eyes Closed: Able to stand 3 seconds Standing Ubsupported with Feet Together: Able to place feet together independently but unable to hold for 30 seconds From Standing, Reach Forward with Outstretched Arm: Reaches forward but needs supervision (goes no distance; hips go posterior instead) From Standing Position, Pick up Object from Floor: Able to pick up shoe, needs supervision From Standing Position, Turn to Look Behind Over each Shoulder: Looks behind one side only/other side shows less weight shift Turn 360 Degrees: Able to turn 360 degrees safely but slowly Standing Unsupported,  Alternately Place Feet  on Step/Stool: Able to complete >2 steps/needs minimal assist Standing Unsupported, One Foot in Front: Loses balance while stepping or standing Standing on One Leg: Tries to lift leg/unable to hold 3 seconds but remains standing independently Total Score: 31 Dynamic Gait Index Level Surface: Mild Impairment Change in Gait Speed: Mild Impairment Gait with Horizontal Head Turns: Moderate Impairment Gait with Vertical Head Turns: Moderate Impairment Gait and Pivot Turn: Moderate Impairment Step Over Obstacle: Moderate Impairment Step Around Obstacles: Moderate Impairment Steps: Mild Impairment Total Score: 11      Communication Communication Communication: No apparent difficulties  Cognition Arousal: Alert Behavior During Therapy: Flat affect   PT - Cognitive impairments: No family/caregiver present to determine baseline, Orientation, Awareness, Memory, Attention, Initiation, Sequencing, Problem solving, Safety/Judgement   Orientation impairments: Place, Time, Situation                   PT - Cognition Comments: Pt unsure where he is, knows he is in [place], unsure of year/month and thinks he is in hospital for fall. Following commands: Impaired Following commands impaired: Follows one step commands with increased time    Cueing Cueing Techniques: Verbal cues, Gestural cues         Pertinent Vitals/Pain Pain Assessment Pain Assessment: No/denies pain     PT Goals (current goals can now be found in the care plan section) Acute Rehab PT Goals Patient Stated Goal: Go home PT Goal Formulation: Patient unable to participate in goal setting Time For Goal Achievement: 09/17/24 Potential to Achieve Goals: Good Additional Goals Additional Goal #1: Improve DGI >7 points to show clinically significant improvement in balance. Progress towards PT goals: Progressing toward goals    Frequency    Min 2X/week      PT Plan  Continue with current  POC        AM-PAC PT 6 Clicks Mobility   Outcome Measure  Help needed turning from your back to your side while in a flat bed without using bedrails?: None Help needed moving from lying on your back to sitting on the side of a flat bed without using bedrails?: None Help needed moving to and from a bed to a chair (including a wheelchair)?: A Little Help needed standing up from a chair using your arms (e.g., wheelchair or bedside chair)?: A Little Help needed to walk in hospital room?: A Little Help needed climbing 3-5 steps with a railing? : A Little 6 Click Score: 20    End of Session Equipment Utilized During Treatment: Gait belt Activity Tolerance: Patient tolerated treatment well Patient left: in bed;with call bell/phone within reach Nurse Communication: Mobility status PT Visit Diagnosis: Unsteadiness on feet (R26.81);Other abnormalities of gait and mobility (R26.89);Difficulty in walking, not elsewhere classified (R26.2);Other symptoms and signs involving the nervous system (R29.898)     Time: 8871-8848 PT Time Calculation (min) (ACUTE ONLY): 23 min  Charges:    $Gait Training: 8-22 mins $Therapeutic Activity: 8-22 mins PT General Charges $$ ACUTE PT VISIT: 1 Visit                    Dorothyann Maier, DPT, CLT  Acute Rehabilitation Services Office: [phone] (Secure chat preferred)    Dorothyann VEAR Maier 09/12/2024, 11:51 AM

## 2024-09-12 NOTE — Plan of Care (Signed)
" °  Problem: Fluid Volume: Goal: Ability to maintain a balanced intake and output will improve Outcome: Progressing   Problem: Skin Integrity: Goal: Risk for impaired skin integrity will decrease Outcome: Progressing   Problem: Clinical Measurements: Goal: Ability to maintain clinical measurements within normal limits will improve Outcome: Progressing Goal: Will remain free from infection Outcome: Progressing   Problem: Activity: Goal: Risk for activity intolerance will decrease Outcome: Progressing   Problem: Nutrition: Goal: Adequate nutrition will be maintained Outcome: Progressing   Problem: Safety: Goal: Ability to remain free from injury will improve Outcome: Progressing   "

## 2024-09-12 NOTE — Progress Notes (Signed)
 " Progress Note   Patient: Matthew Roman FMW:[id number] DOB: 1956/04/02 DOA: 09/02/2024     5 DOS: the patient was seen and examined on 09/12/2024   Brief hospital course: Cylis Ayars is a 68 y.o. male with hx of old stroke, ensuing dementia, multiple TIAs, cirrhosis, diabetes, hyperlipidemia, prior history of hepatic encephalopathy, tobacco use, sleep apnea, who lives with a caretaker, brought in for evaluation of sudden onset of confusion and left-sided neglect on EMS exam Presumably last known well was 11 AM 09/02/2024.  He was having difficulty putting on his coat and doing his day-to-day activities that he is able to do otherwise without a problem.  When EMS was called, they assessed him, he did appear confused, he had some aphasia and also had some left sided neglect for them, leading them to activate a code stroke.  He was admitted under hospital service, neurology consulted.  MRI brain showed early sub acute stroke. Neurology advised DAPT, statin. SNF denied by insurance, awaiting safe disposition.  Assessment and Plan: Acute metabolic encephalopathy secondary to acute ischemic stroke: Initial CT head negative, CTA head and neck negative for LVO, MRI brain eventually showed small acute or early subacute infarction involving posterior right frontal lobe subcortical white matter.   He is continued on DAPT, statin. Echo ruled out PFO.   Seen by PT OT who recommend CIR. But as he was denied for CIR as he has no 24/7 care at home.   Perr to peer done - denied for SNF. [facility]OC [facility] He wishes to go home.   Essential hypertension:  BP stable. Not on any antihypertensives.   Type II obese mellitus, non-insulin -dependent:  Hold metformin .  Blood sugars well-controlled, discontinued SSI.   Hyperlipidemia: Continue atorvastatin  40 mg.   Tobacco abuse:  Discussed ill effects of tobacco, encouraged him to quit.   Chronic thrombocytopenia: Stable.  Out of  bed to chair. Incentive spirometry. Nursing supportive care. Fall, aspiration precautions. Diet:  Diet Orders (From admission, onward)     Start     Ordered   09/03/24 1116  Diet Heart Room service appropriate? Yes; Fluid consistency: Thin  Diet effective now       Question Answer Comment  Room service appropriate? Yes   Fluid consistency: Thin      09/03/24 1115           DVT prophylaxis: heparin  injection 5,000 Units Start: 09/03/24 0600  Level of care: Telemetry   Code Status: Full Code  Subjective: Patient is seen and examined today morning. Says he is bored. Denies any complaints. He is able to walk in hallways alone.  Physical Exam: Vitals:   09/12/24 0022 09/12/24 0424 09/12/24 0758 09/12/24 1133  BP: 116/73 (!) 163/85 (!) 145/80 138/78  Pulse: 71 86 80 (!) 52  Resp: 20 18 19 18   Temp: 98.2 F (36.8 C) (!) 97.3 F (36.3 C) 97.7 F (36.5 C) 98.5 F (36.9 C)  TempSrc: Oral Oral    SpO2: 96% 98% 95% 98%  Weight:      Height:        General - Elderly Caucasian male, no apparent distress HEENT - PERRLA, EOMI, atraumatic head, non tender sinuses. Lung - Clear, basal rales,no  rhonchi, wheezes. Heart - S1, S2 heard, no murmurs, rubs, trace pedal edema. Abdomen - Soft, non tender, bowel sounds good Neuro - Alert, awake and oriented, non focal exam. Skin - Warm and dry.  Data Reviewed:  Latest Ref Rng & Units 09/06/2024   11:31 AM 09/04/2024    5:54 AM 09/02/2024    2:41 PM  CBC  WBC 4.0 - 10.5 K/uL 4.6  3.6    Hemoglobin 13.0 - 17.0 g/dL 85.8  87.1  86.3   Hematocrit 39.0 - 52.0 % 42.9  37.8  40.0   Platelets 150 - 400 K/uL 101  79        Latest Ref Rng & Units 09/07/2024   10:48 AM 09/06/2024   11:31 AM 09/02/2024    2:41 PM  BMP  Glucose 70 - 99 mg/dL  886  868   BUN 8 - 23 mg/dL  16  5   Creatinine 9.38 - 1.24 mg/dL  9.16  9.09   Sodium 864 - 145 mmol/L  140  141   Potassium 3.5 - 5.1 mmol/L 4.7  5.2  4.0   Chloride 98 - 111 mmol/L   102  101   CO2 22 - 32 mmol/L  28    Calcium  8.9 - 10.3 mg/dL  9.6     No results found.  Family Communication: Discussed with patient, understand and agree. All questions answered.  Disposition: Status is: Inpatient Remains inpatient appropriate because: stroke, pending placement.  Planned Discharge Destination: Rehab     Time spent: 43 minutes  Author: Concepcion Riser, MD 09/12/2024 2:28 PM Secure chat 7am to 7pm For on call review [email or web address].    "

## 2024-09-13 DIAGNOSIS — I639 Cerebral infarction, unspecified: Secondary | ICD-10-CM | POA: Diagnosis not present

## 2024-09-13 DIAGNOSIS — I10 Essential (primary) hypertension: Secondary | ICD-10-CM | POA: Diagnosis not present

## 2024-09-13 DIAGNOSIS — E782 Mixed hyperlipidemia: Secondary | ICD-10-CM | POA: Diagnosis not present

## 2024-09-13 DIAGNOSIS — G9341 Metabolic encephalopathy: Secondary | ICD-10-CM | POA: Diagnosis not present

## 2024-09-13 NOTE — Plan of Care (Signed)

## 2024-09-13 NOTE — Progress Notes (Signed)
 Physical Therapy Treatment Patient Details Name: Matthew Roman MRN: [id number] DOB: 1956/06/10 Today's Date: 09/13/2024   History of Present Illness 68 y/o M presenting to ED On 12/12 with aphasia and L neglect, code stroke activated. MRI with small punctate areas of restricted diffusion in R cerebral hemisphere.    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    PT Comments  Pt is progressing towards goals. Currently working on balance due to high fall risk. Requires Min A with balance activities and Min A to CGA for stairs with UE support. Pt continues with difficulty with cognition with 50% carry over with verbal cues for picking up feet during gait due to significant shuffling with improvements throughout and pt occasionally remembering without cues. Due to pt current functional status, home set up and available assistance at home recommending skilled physical therapy services > 3 hours/day in order to address strength, balance and functional mobility to decrease risk for falls, injury, immobility, skin break down and re-hospitalization.      If plan is discharge home, recommend the following: A little help with walking and/or transfers;A little help with bathing/dressing/bathroom;Assistance with cooking/housework;Direct supervision/assist for medications management;Direct supervision/assist for financial management;Assist for transportation;Help with stairs or ramp for entrance;Supervision due to cognitive status     Equipment Recommendations  None recommended by PT       Precautions / Restrictions Precautions Precautions: Fall Recall of Precautions/Restrictions: Impaired Restrictions Weight Bearing Restrictions Per Provider Order: No     Mobility  Bed Mobility Overal bed mobility: Modified Independent       General bed mobility comments: NT - pt received and left OOB throughout session     Transfers Overall transfer level: Needs assistance Equipment used: None Transfers: Sit to/from Stand Sit to Stand: Supervision           General transfer comment: stood from chair, reliant on arm rests to power up    Ambulation/Gait Ambulation/Gait assistance: Contact guard assist, Supervision Gait Distance (Feet): 250 Feet Assistive device: None Gait Pattern/deviations: Step-through pattern, Decreased stance time - left, Decreased stride length, Shuffle Gait velocity: decreased Gait velocity interpretation: <1.31 ft/sec, indicative of household ambulator   General Gait Details: very slow pace, no overt LOB this session. COntinues with shuffling gait; pt was able to correct for short periods of time with 50% carry over throughout session which is an improvement form last session.   Stairs   Stairs assistance: Min assist, Contact guard assist Stair Management: One rail Right, Step to pattern, Backwards, Forwards Number of Stairs: 6 General stair comments: first stepped up with R leg first then stepped up with L leg first.    Balance Overall balance assessment: Needs assistance Sitting-balance support: Feet supported Sitting balance-Leahy Scale: Normal Sitting balance - Comments: sitting unsupported in recliner chair without overt LOB noted   Standing balance support: During functional activity Standing balance-Leahy Scale: Fair Standing balance comment: no overt LOB observed; very slow pace during mobility            Communication Communication Communication: No apparent difficulties Factors Affecting Communication: Difficulty expressing self  Cognition Arousal: Alert Behavior During Therapy: Flat affect   PT - Cognitive impairments: No family/caregiver present to determine baseline, Orientation, Awareness, Memory, Attention, Initiation, Sequencing, Problem solving, Safety/Judgement   Orientation impairments: Place, Time, Situation       PT - Cognition  Comments: Pt continues to know he is in [facility], knew he  was in a hospital today; stated he did not know why he is in the hospital today. Pt thought it was January and did not state year when asked he deflected every time. Following commands: Impaired Following commands impaired: Follows one step commands with increased time    Cueing Cueing Techniques: Verbal cues, Gestural cues     General Comments General comments (skin integrity, edema, etc.): worked on toe taps on 8 inch step for 20 reps requiring Min A for balance; no UE support.      Pertinent Vitals/Pain Pain Assessment Pain Assessment: No/denies pain           PT Goals (current goals can now be found in the care plan section) Acute Rehab PT Goals Patient Stated Goal: Go home PT Goal Formulation: Patient unable to participate in goal setting Time For Goal Achievement: 09/17/24 Potential to Achieve Goals: Good Additional Goals Additional Goal #1: Improve DGI >7 points to show clinically significant improvement in balance. Progress towards PT goals: Progressing toward goals    Frequency    Min 2X/week      PT Plan  Continue with current POC        AM-PAC PT 6 Clicks Mobility   Outcome Measure  Help needed turning from your back to your side while in a flat bed without using bedrails?: None Help needed moving from lying on your back to sitting on the side of a flat bed without using bedrails?: None Help needed moving to and from a bed to a chair (including a wheelchair)?: A Little Help needed standing up from a chair using your arms (e.g., wheelchair or bedside chair)?: A Little Help needed to walk in hospital room?: A Little Help needed climbing 3-5 steps with a railing? : A Little 6 Click Score: 20    End of Session Equipment Utilized During Treatment: Gait belt Activity Tolerance: Patient tolerated treatment well Patient left: in chair;with call bell/phone within reach Nurse Communication: Mobility  status PT Visit Diagnosis: Unsteadiness on feet (R26.81);Other abnormalities of gait and mobility (R26.89);Difficulty in walking, not elsewhere classified (R26.2);Other symptoms and signs involving the nervous system (R29.898)     Time: 8755-8742 PT Time Calculation (min) (ACUTE ONLY): 13 min  Charges:    $Neuromuscular Re-education: 8-22 mins PT General Charges $$ ACUTE PT VISIT: 1 Visit                    Dorothyann Maier, DPT, CLT  Acute Rehabilitation Services Office: [phone] (Secure chat preferred)    Dorothyann VEAR Maier 09/13/2024, 1:03 PM

## 2024-09-13 NOTE — Progress Notes (Signed)
 " Progress Note   Patient: Matthew Roman FMW:[id number] DOB: 11/19/1955 DOA: 09/02/2024     6 DOS: the patient was seen and examined on 09/13/2024   Brief hospital course: Matthew Roman is a 68 y.o. male with hx of old stroke, ensuing dementia, multiple TIAs, cirrhosis, diabetes, hyperlipidemia, prior history of hepatic encephalopathy, tobacco use, sleep apnea, who lives with a caretaker, brought in for evaluation of sudden onset of confusion and left-sided neglect on EMS exam Presumably last known well was 11 AM 09/02/2024.  He was having difficulty putting on his coat and doing his day-to-day activities that he is able to do otherwise without a problem.  When EMS was called, they assessed him, he did appear confused, he had some aphasia and also had some left sided neglect for them, leading them to activate a code stroke.  He was admitted under hospital service, neurology consulted.  MRI brain showed early sub acute stroke. Neurology advised DAPT, statin. SNF denied by insurance, awaiting safe disposition.  Assessment and Plan: Acute metabolic encephalopathy secondary to acute ischemic stroke: Initial CT head negative, CTA head and neck negative for LVO, MRI brain eventually showed small acute or early subacute infarction involving posterior right frontal lobe subcortical white matter.   He is continued on DAPT, statin. Echo ruled out PFO.   Seen by PT OT who recommend CIR. But as he was denied for CIR as he has no 24/7 care at home.   [facility] to peer done - denied for SNF. No family available to take care 24/7. [facility]    Essential hypertension:  BP stable. Not on any antihypertensives.   Type II obese mellitus, non-insulin -dependent:  Hold metformin .  Blood sugars well-controlled, discontinued SSI.   Hyperlipidemia: Continue atorvastatin  40 mg.   Tobacco abuse:  Discussed ill effects of tobacco, encouraged him to quit.   Chronic thrombocytopenia:  Stable.  Out of bed to chair. Incentive spirometry. Nursing supportive care. Fall, aspiration precautions. Diet:  Diet Orders (From admission, onward)     Start     Ordered   09/03/24 1116  Diet Heart Room service appropriate? Yes; Fluid consistency: Thin  Diet effective now       Question Answer Comment  Room service appropriate? Yes   Fluid consistency: Thin      09/03/24 1115           DVT prophylaxis: heparin  injection 5,000 Units Start: 09/03/24 0600  Level of care: Telemetry   Code Status: Full Code  Subjective: Patient is seen and examined today morning. Seen him walking in  hallways, talking to RN at nurse station. No overnight issues.  Physical Exam: Vitals:   09/13/24 0007 09/13/24 0431 09/13/24 0810 09/13/24 1200  BP: 134/67 136/84 (!) 140/75 128/64  Pulse: 69 66 74 67  Resp: 18 20 18 16   Temp: 97.7 F (36.5 C) 97.8 F (36.6 C) (!) 97.5 F (36.4 C) 97.9 F (36.6 C)  TempSrc: Oral Oral  Oral  SpO2: 96% 98% 98% 98%  Weight:      Height:        General - Elderly Caucasian male, no apparent distress HEENT - PERRLA, EOMI, atraumatic head, non tender sinuses. Lung - Clear, basal rales,no  rhonchi, wheezes. Heart - S1, S2 heard, no murmurs, rubs, trace pedal edema. Abdomen - Soft, non tender, bowel sounds good Neuro - Alert, awake and oriented, non focal exam. Skin - Warm and dry.  Data Reviewed:  Latest Ref Rng & Units 09/06/2024   11:31 AM 09/04/2024    5:54 AM 09/02/2024    2:41 PM  CBC  WBC 4.0 - 10.5 K/uL 4.6  3.6    Hemoglobin 13.0 - 17.0 g/dL 85.8  87.1  86.3   Hematocrit 39.0 - 52.0 % 42.9  37.8  40.0   Platelets 150 - 400 K/uL 101  79        Latest Ref Rng & Units 09/07/2024   10:48 AM 09/06/2024   11:31 AM 09/02/2024    2:41 PM  BMP  Glucose 70 - 99 mg/dL  886  868   BUN 8 - 23 mg/dL  16  5   Creatinine 9.38 - 1.24 mg/dL  9.16  9.09   Sodium 864 - 145 mmol/L  140  141   Potassium 3.5 - 5.1 mmol/L 4.7  5.2  4.0   Chloride  98 - 111 mmol/L  102  101   CO2 22 - 32 mmol/L  28    Calcium  8.9 - 10.3 mg/dL  9.6     No results found.  Family Communication: Discussed with patient, understand and agree. All questions answered.  Disposition: Status is: Inpatient Remains inpatient appropriate because: stroke, pending placement.  Planned Discharge Destination: Rehab     Time spent: 40 minutes  Author: Concepcion Riser, MD 09/13/2024 2:49 PM Secure chat 7am to 7pm For on call review [email or web address]    "

## 2024-09-13 NOTE — TOC Progression Note (Signed)
 Transition of Care [facility]) - Progression Note    Patient Details  Name: Matthew Roman MRN: [id number] Date of Birth: 27-May-1956  Transition of Care [facility]) CM/SW Contact  [name], [place] Phone Number: 09/13/2024, 5:14 PM  Clinical Narrative:     CSW reached out to [place] primary care social worker to ask about placement through the [place]. SW sent CSW community living center screening checklist and [place] information: [phone]. CSW will submit screening checklist and paperwork for placement.   Expected Discharge Plan: Skilled Nursing Facility Barriers to Discharge: Insurance Authorization               Expected Discharge Plan and Services       Living arrangements for the past 2 months: Single Family Home                                       Social Drivers of Health (SDOH) Interventions SDOH Screenings   Food Insecurity: Patient Unable To Answer (09/03/2024)  Housing: Patient Unable To Answer (09/03/2024)  Transportation Needs: Patient Unable To Answer (09/03/2024)  Utilities: Patient Unable To Answer (09/03/2024)  Social Connections: Patient Unable To Answer (09/03/2024)  Tobacco Use: High Risk (09/03/2024)    Readmission Risk Interventions     No data to display

## 2024-09-13 NOTE — Plan of Care (Signed)
" °  Problem: Health Behavior/Discharge Planning: Goal: Goals will be collaboratively established with patient/family Outcome: Progressing   Problem: Self-Care: Goal: Ability to participate in self-care as condition permits will improve Outcome: Progressing   Problem: Nutrition: Goal: Dietary intake will improve Outcome: Progressing   Problem: Coping: Goal: Ability to adjust to condition or change in health will improve Outcome: Progressing   Problem: Nutritional: Goal: Maintenance of adequate nutrition will improve Outcome: Progressing   Problem: Skin Integrity: Goal: Risk for impaired skin integrity will decrease Outcome: Progressing   "

## 2024-09-14 DIAGNOSIS — E782 Mixed hyperlipidemia: Secondary | ICD-10-CM | POA: Diagnosis not present

## 2024-09-14 DIAGNOSIS — I10 Essential (primary) hypertension: Secondary | ICD-10-CM | POA: Diagnosis not present

## 2024-09-14 DIAGNOSIS — I639 Cerebral infarction, unspecified: Secondary | ICD-10-CM | POA: Diagnosis not present

## 2024-09-14 DIAGNOSIS — G9341 Metabolic encephalopathy: Secondary | ICD-10-CM | POA: Diagnosis not present

## 2024-09-14 NOTE — Progress Notes (Addendum)
 Speech Language Pathology  Patient Details Name: Chevy Sweigert MRN: [id number] DOB: 20-Aug-1956 Today's Date: 09/14/2024 Time:  -     Pt has baseline history of dementia with significant cognitive impairments and aphasia from assessments in 2019, outpatient ST 2023 x 1 until he requested to be discharged. He was evaluated 08/1524 with findings of cognitive impairments and likely residual deficits in expressive language. Seen for treatment 12/17 requiring max assist and difficulty retaining information and does not appear to have the ability to compensate for impairments requiring 24 hour supervision. Disposition plans are for possible memory care. ST will sign off at this time and defer to next venue for family education.    Dustin Olam Bull  09/14/2024, 2:58 PM

## 2024-09-14 NOTE — Progress Notes (Signed)
 " Progress Note   Patient: Matthew Roman FMW:[id number] DOB: 10/10/1955 DOA: 09/02/2024     7 DOS: the patient was seen and examined on 09/14/2024   Brief hospital course: Matthew Roman is a 68 y.o. male with hx of old stroke, ensuing dementia, multiple TIAs, cirrhosis, diabetes, hyperlipidemia, prior history of hepatic encephalopathy, tobacco use, sleep apnea, who lives with a caretaker, brought in for evaluation of sudden onset of confusion and left-sided neglect on EMS exam Presumably last known well was 11 AM 09/02/2024.  He was having difficulty putting on his coat and doing his day-to-day activities that he is able to do otherwise without a problem.  When EMS was called, they assessed him, he did appear confused, he had some aphasia and also had some left sided neglect for them, leading them to activate a code stroke.  He was admitted under hospital service, neurology consulted.  MRI brain showed early sub acute stroke. Neurology advised DAPT, statin. SNF denied by insurance, awaiting safe disposition.  Assessment and Plan: Acute metabolic encephalopathy secondary to acute ischemic stroke: Initial CT head negative, CTA head and neck negative for LVO, MRI brain eventually showed small acute or early subacute infarction involving posterior right frontal lobe subcortical white matter.   He is continued on DAPT, statin. Echo ruled out PFO.   Seen by PT OT who recommend CIR. But as he was denied for CIR as he has no 24/7 care at home.   [facility] to peer done - denied for SNF. No family available to take care 24/7. [facility].    Essential hypertension:  BP stable. Not on any antihypertensives.   Type II obese mellitus, non-insulin -dependent:  Hold metformin .  Blood sugars well-controlled, discontinued SSI. Hypoglycemia protocol.   Hyperlipidemia: Continue atorvastatin  40 mg.   Tobacco abuse:  Encouraged him to quit.   Chronic thrombocytopenia:  Stable.  Out of bed to chair. Incentive spirometry. Nursing supportive care. Fall, aspiration precautions. Diet:  Diet Orders (From admission, onward)     Start     Ordered   09/03/24 1116  Diet Heart Room service appropriate? Yes; Fluid consistency: Thin  Diet effective now       Question Answer Comment  Room service appropriate? Yes   Fluid consistency: Thin      09/03/24 1115           DVT prophylaxis: heparin  injection 5,000 Units Start: 09/03/24 0600  Level of care: Telemetry   Code Status: Full Code  Subjective: Patient is seen and examined today morning. Sitting in chair, states he feels better to go home. No overnight issues.  Physical Exam: Vitals:   09/14/24 0048 09/14/24 0417 09/14/24 0759 09/14/24 1148  BP: (!) 144/77 (!) 146/76 (!) 145/70 132/74  Pulse: (!) 59 63 62 63  Resp: 20 20    Temp: 97.7 F (36.5 C) 97.8 F (36.6 C) 98.2 F (36.8 C) 98.2 F (36.8 C)  TempSrc: Oral Oral Oral   SpO2: 97% 97% 99% 100%  Weight:      Height:        General - Elderly Caucasian male, no apparent distress HEENT - PERRLA, EOMI, atraumatic head, non tender sinuses. Lung - Clear, basal rales,no  rhonchi, wheezes. Heart - S1, S2 heard, no murmurs, rubs, trace pedal edema. Abdomen - Soft, non tender, bowel sounds good Neuro - Alert, awake and oriented, non focal exam. Skin - Warm and dry.  Data Reviewed:  Latest Ref Rng & Units 09/06/2024   11:31 AM 09/04/2024    5:54 AM 09/02/2024    2:41 PM  CBC  WBC 4.0 - 10.5 K/uL 4.6  3.6    Hemoglobin 13.0 - 17.0 g/dL 85.8  87.1  86.3   Hematocrit 39.0 - 52.0 % 42.9  37.8  40.0   Platelets 150 - 400 K/uL 101  79        Latest Ref Rng & Units 09/07/2024   10:48 AM 09/06/2024   11:31 AM 09/02/2024    2:41 PM  BMP  Glucose 70 - 99 mg/dL  886  868   BUN 8 - 23 mg/dL  16  5   Creatinine 9.38 - 1.24 mg/dL  9.16  9.09   Sodium 864 - 145 mmol/L  140  141   Potassium 3.5 - 5.1 mmol/L 4.7  5.2  4.0   Chloride 98 -  111 mmol/L  102  101   CO2 22 - 32 mmol/L  28    Calcium  8.9 - 10.3 mg/dL  9.6     No results found.  Family Communication: Discussed with patient, understand and agree. All questions answered.  Disposition: Status is: Inpatient Remains inpatient appropriate because: pending safe discharge plan.  Planned Discharge Destination: Rehab     Time spent: 39 minutes  Author: Concepcion Riser, MD 09/14/2024 2:19 PM Secure chat 7am to 7pm For on call review [email or web address]    "

## 2024-09-14 NOTE — Plan of Care (Signed)

## 2024-09-15 DIAGNOSIS — G9341 Metabolic encephalopathy: Secondary | ICD-10-CM | POA: Diagnosis not present

## 2024-09-15 DIAGNOSIS — I639 Cerebral infarction, unspecified: Secondary | ICD-10-CM | POA: Diagnosis not present

## 2024-09-15 DIAGNOSIS — E782 Mixed hyperlipidemia: Secondary | ICD-10-CM | POA: Diagnosis not present

## 2024-09-15 DIAGNOSIS — I10 Essential (primary) hypertension: Secondary | ICD-10-CM | POA: Diagnosis not present

## 2024-09-15 MED ORDER — AMLODIPINE BESYLATE 5 MG PO TABS
5.0000 mg | ORAL_TABLET | Freq: Every day | ORAL | Status: AC
  Administered 2024-09-15 – 2024-10-28 (×43): 5 mg via ORAL
  Filled 2024-09-15 (×37): qty 1

## 2024-09-15 MED ORDER — HYDRALAZINE HCL 25 MG PO TABS: 25.0000 mg | ORAL_TABLET | Freq: Three times a day (TID) | ORAL | Status: AC | PRN

## 2024-09-15 NOTE — Progress Notes (Signed)
 " Progress Note   Patient: Matthew Roman FMW:[id number] DOB: April 27, 1956 DOA: 09/02/2024     8 DOS: the patient was seen and examined on 09/15/2024   Brief hospital course: Claxton Levitz is a 68 y.o. male with hx of old stroke, ensuing dementia, multiple TIAs, cirrhosis, diabetes, hyperlipidemia, prior history of hepatic encephalopathy, tobacco use, sleep apnea, who lives with a caretaker, brought in for evaluation of sudden onset of confusion and left-sided neglect on EMS exam Presumably last known well was 11 AM 09/02/2024.  He was having difficulty putting on his coat and doing his day-to-day activities that he is able to do otherwise without a problem.  When EMS was called, they assessed him, he did appear confused, he had some aphasia and also had some left sided neglect for them, leading them to activate a code stroke.  He was admitted under hospital service, neurology consulted.  MRI brain showed early sub acute stroke. Neurology advised DAPT, statin. SNF denied by insurance, awaiting safe disposition.  Assessment and Plan: Acute metabolic encephalopathy secondary to acute ischemic stroke: Initial CT head negative, CTA head and neck negative for LVO, MRI brain eventually showed small acute or early subacute infarction involving posterior right frontal lobe subcortical white matter.   He is continued on DAPT, statin. Echo ruled out PFO.   Seen by PT OT who recommend CIR. But as he was denied for CIR as he has no 24/7 care at home.   [facility]err to peer done - denied for SNF. No family available to take care 24/7. [facility]OC [facility].    Essential hypertension:  BP elevated. Started home dose Norvasc . Hydralazine  25mg  Q8 PRN ordered.   Type II Diabetes mellitus, non-insulin -dependent:  Hold metformin .  Blood sugars well-controlled, discontinued SSI. Hypoglycemia protocol.   Hyperlipidemia: Continue atorvastatin  40 mg.   Tobacco abuse:  Encouraged him to quit.    Chronic thrombocytopenia: Stable.  Out of bed to chair. Incentive spirometry. Nursing supportive care. Fall, aspiration precautions. Diet:  Diet Orders (From admission, onward)     Start     Ordered   09/03/24 1116  Diet Heart Room service appropriate? Yes; Fluid consistency: Thin  Diet effective now       Question Answer Comment  Room service appropriate? Yes   Fluid consistency: Thin      09/03/24 1115           DVT prophylaxis: heparin  injection 5,000 Units Start: 09/03/24 0600  Level of care: Telemetry   Code Status: Full Code  Subjective: Patient is seen and examined today morning. Sleeping comfortably. RN noted blood pressures going up. No overnight issues.  Physical Exam: Vitals:   09/15/24 0514 09/15/24 0755 09/15/24 0908 09/15/24 0922  BP: (!) 156/83 (!) 160/88 (!) 166/89 (!) 162/82  Pulse: 85 (!) 112 99   Resp: 20 20    Temp: 97.8 F (36.6 C) 98.2 F (36.8 C)    TempSrc: Oral Oral    SpO2: 98% 100% 99%   Weight:      Height:        General - Elderly Caucasian male, no apparent distress HEENT - PERRLA, EOMI, atraumatic head, non tender sinuses. Lung - Clear, basal rales,no  rhonchi, wheezes. Heart - S1, S2 heard, no murmurs, rubs, trace pedal edema. Abdomen - Soft, non tender, bowel sounds good Neuro - Alert, awake and oriented, non focal exam. Skin - Warm and dry.  Data Reviewed:      Latest  Ref Rng & Units 09/06/2024   11:31 AM 09/04/2024    5:54 AM 09/02/2024    2:41 PM  CBC  WBC 4.0 - 10.5 K/uL 4.6  3.6    Hemoglobin 13.0 - 17.0 g/dL 85.8  87.1  86.3   Hematocrit 39.0 - 52.0 % 42.9  37.8  40.0   Platelets 150 - 400 K/uL 101  79        Latest Ref Rng & Units 09/07/2024   10:48 AM 09/06/2024   11:31 AM 09/02/2024    2:41 PM  BMP  Glucose 70 - 99 mg/dL  886  868   BUN 8 - 23 mg/dL  16  5   Creatinine 9.38 - 1.24 mg/dL  9.16  9.09   Sodium 864 - 145 mmol/L  140  141   Potassium 3.5 - 5.1 mmol/L 4.7  5.2  4.0   Chloride 98 - 111  mmol/L  102  101   CO2 22 - 32 mmol/L  28    Calcium  8.9 - 10.3 mg/dL  9.6     No results found.  Family Communication: Discussed with patient, understand and agree. All questions answered.  Disposition: Status is: Inpatient Remains inpatient appropriate because: pending safe discharge plan.  Planned Discharge Destination: Rehab     Time spent: 41 minutes  Author: Concepcion Riser, MD 09/15/2024 12:42 PM Secure chat 7am to 7pm For on call review [email or web address]    "

## 2024-09-15 NOTE — Plan of Care (Signed)

## 2024-09-16 DIAGNOSIS — I639 Cerebral infarction, unspecified: Secondary | ICD-10-CM | POA: Diagnosis not present

## 2024-09-16 DIAGNOSIS — I10 Essential (primary) hypertension: Secondary | ICD-10-CM | POA: Diagnosis not present

## 2024-09-16 DIAGNOSIS — E782 Mixed hyperlipidemia: Secondary | ICD-10-CM | POA: Diagnosis not present

## 2024-09-16 DIAGNOSIS — G9341 Metabolic encephalopathy: Secondary | ICD-10-CM | POA: Diagnosis not present

## 2024-09-16 DIAGNOSIS — Z720 Tobacco use: Secondary | ICD-10-CM | POA: Diagnosis not present

## 2024-09-16 NOTE — Plan of Care (Signed)
" °  Problem: Education: Goal: Knowledge of disease or condition will improve Outcome: Progressing Goal: Knowledge of secondary prevention will improve (MUST DOCUMENT ALL) Outcome: Progressing Goal: Knowledge of patient specific risk factors will improve (DELETE if not current risk factor) Outcome: Progressing   Problem: Ischemic Stroke/TIA Tissue Perfusion: Goal: Complications of ischemic stroke/TIA will be minimized Outcome: Progressing   Problem: Nutrition: Goal: Risk of aspiration will decrease Outcome: Progressing Goal: Dietary intake will improve Outcome: Progressing   Problem: Self-Care: Goal: Ability to participate in self-care as condition permits will improve Outcome: Progressing Goal: Verbalization of feelings and concerns over difficulty with self-care will improve Outcome: Progressing Goal: Ability to communicate needs accurately will improve Outcome: Progressing   "

## 2024-09-16 NOTE — TOC Progression Note (Signed)
 Transition of Care [facility]) - Progression Note    Patient Details  Name: Matthew Roman MRN: [id number] Date of Birth: [birth year]  Transition of Care [facility]) CM/SW Contact  [name], [place] Phone Number: 09/16/2024, 4:47 PM  Clinical Narrative:     CSW faxed out clinicals and screening checklist to [facility] Screening Team (fax: [phone]) for Longterm care/memory care.CSW will follow up with [facility] on Monday regarding discharge plan. CSW will continue to follow as needs arise.   Expected Discharge Plan: Skilled Nursing Facility Barriers to Discharge: Insurance Authorization               Expected Discharge Plan and Services       Living arrangements for the past 2 months: Single Family Home                                       Social Drivers of Health (SDOH) Interventions SDOH Screenings   Food Insecurity: Patient Unable To Answer (09/03/2024)  Housing: Patient Unable To Answer (09/03/2024)  Transportation Needs: Patient Unable To Answer (09/03/2024)  Utilities: Patient Unable To Answer (09/03/2024)  Social Connections: Patient Unable To Answer (09/03/2024)  Tobacco Use: High Risk (09/03/2024)    Readmission Risk Interventions     No data to display

## 2024-09-16 NOTE — Progress Notes (Signed)
 " Progress Note   Patient: Matthew Roman FMW:[id number] DOB: July 03, 1956 DOA: 09/02/2024     9 DOS: the patient was seen and examined on 09/16/2024   Brief hospital course: Gonsalo Cuthbertson is a 68 y.o. male with hx of old stroke, ensuing dementia, multiple TIAs, cirrhosis, diabetes, hyperlipidemia, prior history of hepatic encephalopathy, tobacco use, sleep apnea, who lives with a caretaker, brought in for evaluation of sudden onset of confusion and left-sided neglect on EMS exam Presumably last known well was 11 AM 09/02/2024.  He was having difficulty putting on his coat and doing his day-to-day activities that he is able to do otherwise without a problem.  When EMS was called, they assessed him, he did appear confused, he had some aphasia and also had some left sided neglect for them, leading them to activate a code stroke.  He was admitted under hospital service, neurology consulted.  MRI brain showed early sub acute stroke. Neurology advised DAPT, statin. SNF denied by insurance, awaiting [facility] approval for LTC.  Assessment and Plan: Acute metabolic encephalopathy secondary to acute ischemic stroke: Initial CT head negative, CTA head and neck negative for LVO, MRI brain eventually showed small acute or early subacute infarction involving posterior right frontal lobe subcortical white matter.   He is continued on DAPT, statin. Echo ruled out PFO.   Seen by PT OT who recommend CIR. But as he was denied for CIR as he has no 24/7 care at home.   Perr to peer done - denied for SNF. No family available to take care 24/7. TOC working on long term care facility with [facility]. VA screening checklist signed.   Essential hypertension:  BP elevated. Continue home dose Norvasc . Hydralazine  25mg  Q8 PRN ordered.   Type II Diabetes mellitus, non-insulin -dependent:  A1c 5.5. Blood sugars well-controlled, discontinued SSI. Hypoglycemia protocol.   Hyperlipidemia: Continue atorvastatin  40 mg.   Tobacco abuse:   Encouraged him to quit.   Chronic thrombocytopenia: Stable.  Out of bed to chair. Incentive spirometry. Nursing supportive care. Fall, aspiration precautions. Diet:  Diet Orders (From admission, onward)     Start     Ordered   09/03/24 1116  Diet Heart Room service appropriate? Yes; Fluid consistency: Thin  Diet effective now       Question Answer Comment  Room service appropriate? Yes   Fluid consistency: Thin      09/03/24 1115           DVT prophylaxis: heparin  injection 5,000 Units Start: 09/03/24 0600  Level of care: Telemetry   Code Status: Full Code  Subjective: Patient is seen and examined today morning. Did feel dizzy yesterday. Feels better today. Getting out of bed, No overnight issues.  Physical Exam: Vitals:   09/15/24 2023 09/16/24 0453 09/16/24 0733 09/16/24 1153  BP: 126/74 129/81 138/86 112/84  Pulse: 100 92 82 (!) 105  Resp: 18 16 20 20   Temp: (!) 97.5 F (36.4 C) 98 F (36.7 C) 97.9 F (36.6 C) 98.1 F (36.7 C)  TempSrc: Oral Oral Oral Oral  SpO2: 94% 97% 96% 94%  Weight:      Height:        General - Elderly Caucasian male, no apparent distress HEENT - PERRLA, EOMI, atraumatic head, non tender sinuses. Lung - Clear, basal rales,no  rhonchi, wheezes. Heart - S1, S2 heard, no murmurs, rubs, trace pedal edema. Abdomen - Soft, non tender, bowel sounds good Neuro - Alert, awake and oriented, non focal exam. Skin - Warm and  dry.  Data Reviewed:      Latest Ref Rng & Units 09/06/2024   11:31 AM 09/04/2024    5:54 AM 09/02/2024    2:41 PM  CBC  WBC 4.0 - 10.5 K/uL 4.6  3.6    Hemoglobin 13.0 - 17.0 g/dL 85.8  87.1  86.3   Hematocrit 39.0 - 52.0 % 42.9  37.8  40.0   Platelets 150 - 400 K/uL 101  79        Latest Ref Rng & Units 09/07/2024   10:48 AM 09/06/2024   11:31 AM 09/02/2024    2:41 PM  BMP  Glucose 70 - 99 mg/dL  886  868   BUN 8 - 23 mg/dL  16  5   Creatinine 9.38 - 1.24 mg/dL  9.16  9.09   Sodium 864 - 145 mmol/L   140  141   Potassium 3.5 - 5.1 mmol/L 4.7  5.2  4.0   Chloride 98 - 111 mmol/L  102  101   CO2 22 - 32 mmol/L  28    Calcium  8.9 - 10.3 mg/dL  9.6     No results found.  Family Communication: Discussed with patient, understand and agree. All questions answered.  Disposition: Status is: Inpatient Remains inpatient appropriate because: pending safe discharge plan. [facility] auth.  Planned Discharge Destination: Rehab     Time spent: 38 minutes  Author: Concepcion Riser, MD 09/16/2024 1:36 PM Secure chat 7am to 7pm For on call review [email or web address]    "

## 2024-09-16 NOTE — Plan of Care (Signed)
" °  Problem: Education: Goal: Knowledge of disease or condition will improve Outcome: Progressing Goal: Knowledge of secondary prevention will improve (MUST DOCUMENT ALL) Outcome: Progressing Goal: Knowledge of patient specific risk factors will improve (DELETE if not current risk factor) Outcome: Progressing   Problem: Coping: Goal: Will verbalize positive feelings about self Outcome: Progressing Goal: Will identify appropriate support needs Outcome: Progressing   Problem: Health Behavior/Discharge Planning: Goal: Ability to manage health-related needs will improve Outcome: Progressing Goal: Goals will be collaboratively established with patient/family Outcome: Progressing   "

## 2024-09-16 NOTE — Plan of Care (Signed)
" °  Problem: Skin Integrity: Goal: Risk for impaired skin integrity will decrease Outcome: Progressing   Problem: Safety: Goal: Ability to remain free from injury will improve Outcome: Progressing   Problem: Pain Managment: Goal: General experience of comfort will improve and/or be controlled Outcome: Progressing   Problem: Coping: Goal: Level of anxiety will decrease 09/16/2024 0540 by Lowella Sewer, RN Outcome: Progressing 09/16/2024 0538 by Lowella Sewer, RN Outcome: Progressing   Problem: Elimination: Goal: Will not experience complications related to bowel motility 09/16/2024 0540 by Lowella Sewer, RN Outcome: Progressing 09/16/2024 0538 by Lowella Sewer, RN Outcome: Progressing Goal: Will not experience complications related to urinary retention 09/16/2024 0540 by Lowella Sewer, RN Outcome: Progressing 09/16/2024 0538 by Lowella Sewer, RN Outcome: Progressing   Problem: Nutrition: Goal: Adequate nutrition will be maintained 09/16/2024 0540 by Lowella Sewer, RN Outcome: Progressing 09/16/2024 0538 by Lowella Sewer, RN Outcome: Progressing   Problem: Activity: Goal: Risk for activity intolerance will decrease 09/16/2024 0540 by Lowella Sewer, RN Outcome: Progressing 09/16/2024 0538 by Lowella Sewer, RN Outcome: Progressing   Problem: Health Behavior/Discharge Planning: Goal: Ability to manage health-related needs will improve 09/16/2024 0540 by Lowella Sewer, RN Outcome: Progressing 09/16/2024 0538 by Lowella Sewer, RN Outcome: Progressing   Problem: Clinical Measurements: Goal: Ability to maintain clinical measurements within normal limits will improve 09/16/2024 0540 by Lowella Sewer, RN Outcome: Progressing 09/16/2024 0538 by Lowella Sewer, RN Outcome: Progressing Goal: Will remain free from infection 09/16/2024 0540 by Lowella Sewer, RN Outcome: Progressing 09/16/2024 0538 by Lowella Sewer, RN Outcome:  Progressing Goal: Diagnostic test results will improve 09/16/2024 0540 by Lowella Sewer, RN Outcome: Progressing 09/16/2024 0538 by Lowella Sewer, RN Outcome: Progressing Goal: Respiratory complications will improve 09/16/2024 0540 by Lowella Sewer, RN Outcome: Progressing 09/16/2024 0538 by Lowella Sewer, RN Outcome: Progressing Goal: Cardiovascular complication will be avoided 09/16/2024 0540 by Lowella Sewer, RN Outcome: Progressing 09/16/2024 0538 by Lowella Sewer, RN Outcome: Progressing   Problem: Education: Goal: Knowledge of General Education information will improve Description: Including pain rating scale, medication(s)/side effects and non-pharmacologic comfort measures 09/16/2024 0540 by Lowella Sewer, RN Outcome: Progressing 09/16/2024 0538 by Lowella Sewer, RN Outcome: Progressing   "

## 2024-09-17 DIAGNOSIS — G9341 Metabolic encephalopathy: Secondary | ICD-10-CM | POA: Diagnosis not present

## 2024-09-17 DIAGNOSIS — E782 Mixed hyperlipidemia: Secondary | ICD-10-CM | POA: Diagnosis not present

## 2024-09-17 DIAGNOSIS — I639 Cerebral infarction, unspecified: Secondary | ICD-10-CM | POA: Diagnosis not present

## 2024-09-17 DIAGNOSIS — I10 Essential (primary) hypertension: Secondary | ICD-10-CM | POA: Diagnosis not present

## 2024-09-17 NOTE — Plan of Care (Signed)
  Problem: Education: Goal: Knowledge of disease or condition will improve Outcome: Progressing Goal: Knowledge of secondary prevention will improve (MUST DOCUMENT ALL) Outcome: Progressing Goal: Knowledge of patient specific risk factors will improve (DELETE if not current risk factor) Outcome: Progressing   Problem: Ischemic Stroke/TIA Tissue Perfusion: Goal: Complications of ischemic stroke/TIA will be minimized Outcome: Progressing   Problem: Coping: Goal: Will verbalize positive feelings about self Outcome: Progressing Goal: Will identify appropriate support needs Outcome: Progressing   Problem: Health Behavior/Discharge Planning: Goal: Ability to manage health-related needs will improve Outcome: Progressing Goal: Goals will be collaboratively established with patient/family Outcome: Progressing   Problem: Self-Care: Goal: Ability to participate in self-care as condition permits will improve Outcome: Progressing Goal: Verbalization of feelings and concerns over difficulty with self-care will improve Outcome: Progressing Goal: Ability to communicate needs accurately will improve Outcome: Progressing

## 2024-09-17 NOTE — Progress Notes (Signed)
 " Progress Note   Patient: Matthew Roman FMW:[id number] DOB: April 04, 1956 DOA: 09/02/2024     10 DOS: the patient was seen and examined on 09/17/2024   Brief hospital course: Whit Bruni is a 68 y.o. male with hx of old stroke, ensuing dementia, multiple TIAs, cirrhosis, diabetes, hyperlipidemia, prior history of hepatic encephalopathy, tobacco use, sleep apnea, who lives with a caretaker, brought in for evaluation of sudden onset of confusion and left-sided neglect on EMS exam Presumably last known well was 11 AM 09/02/2024.  He was having difficulty putting on his coat and doing his day-to-day activities that he is able to do otherwise without a problem.  When EMS was called, they assessed him, he did appear confused, he had some aphasia and also had some left sided neglect for them, leading them to activate a code stroke.  He was admitted under hospital service, neurology consulted.  MRI brain showed early sub acute stroke. Neurology advised DAPT, statin. SNF denied by insurance, awaiting [facility] approval for LTC.  Assessment and Plan: Acute metabolic encephalopathy secondary to acute ischemic stroke: Initial CT head negative, CTA head and neck negative for LVO, MRI brain eventually showed small acute or early subacute infarction involving posterior right frontal lobe subcortical white matter.   He is continued on DAPT, statin. Echo ruled out PFO.   Seen by PT OT who recommend CIR. But as he was denied for CIR as he has no 24/7 care at home.   Perr to peer done - denied for SNF. No family available to take care 24/7. TOC working on long term care facility with [facility]. [facility] screening checklist signed.   Essential hypertension:  BP elevated. Continue home dose Norvasc . Hydralazine  25mg  Q8 PRN ordered.   Type II Diabetes mellitus, non-insulin -dependent:  A1c 5.5. Blood sugars well-controlled, discontinued SSI. Hypoglycemia protocol.   Hyperlipidemia: Continue atorvastatin  40 mg.   Tobacco abuse:   Encouraged him to quit.   Chronic thrombocytopenia: Stable.  Out of bed to chair. Incentive spirometry. Nursing supportive care. Fall, aspiration precautions. Diet:  Diet Orders (From admission, onward)     Start     Ordered   09/03/24 1116  Diet Heart Room service appropriate? Yes; Fluid consistency: Thin  Diet effective now       Question Answer Comment  Room service appropriate? Yes   Fluid consistency: Thin      09/03/24 1115           DVT prophylaxis: heparin  injection 5,000 Units Start: 09/03/24 0600  Level of care: Telemetry   Code Status: Full Code  Subjective: Patient is seen and examined today morning. Dizziness improved. Feels bored. Getting out of bed, No overnight issues.  Physical Exam: Vitals:   09/16/24 2004 09/17/24 0049 09/17/24 0444 09/17/24 1157  BP: (!) 143/74 (!) 136/95 122/71 126/67  Pulse: 94 97 91 62  Resp: 18 16 16 17   Temp: 98 F (36.7 C) 98 F (36.7 C) 97.8 F (36.6 C) 97.7 F (36.5 C)  TempSrc: Oral Oral Oral Oral  SpO2: 98% 98% 97% 99%  Weight:      Height:        General - Elderly Caucasian male, no apparent distress HEENT - PERRLA, EOMI, atraumatic head, non tender sinuses. Lung - Clear, basal rales,no  rhonchi, wheezes. Heart - S1, S2 heard, no murmurs, rubs, trace pedal edema. Abdomen - Soft, non tender, bowel sounds good Neuro - Alert, awake and oriented, non focal exam. Skin - Warm and dry.  Data  Reviewed:      Latest Ref Rng & Units 09/06/2024   11:31 AM 09/04/2024    5:54 AM 09/02/2024    2:41 PM  CBC  WBC 4.0 - 10.5 K/uL 4.6  3.6    Hemoglobin 13.0 - 17.0 g/dL 85.8  87.1  86.3   Hematocrit 39.0 - 52.0 % 42.9  37.8  40.0   Platelets 150 - 400 K/uL 101  79        Latest Ref Rng & Units 09/07/2024   10:48 AM 09/06/2024   11:31 AM 09/02/2024    2:41 PM  BMP  Glucose 70 - 99 mg/dL  886  868   BUN 8 - 23 mg/dL  16  5   Creatinine 9.38 - 1.24 mg/dL  9.16  9.09   Sodium 864 - 145 mmol/L  140  141    Potassium 3.5 - 5.1 mmol/L 4.7  5.2  4.0   Chloride 98 - 111 mmol/L  102  101   CO2 22 - 32 mmol/L  28    Calcium  8.9 - 10.3 mg/dL  9.6     No results found.  Family Communication: Discussed with patient, understand and agree. All questions answered.  Disposition: Status is: Inpatient Remains inpatient appropriate because: pending safe discharge plan. [facility] auth pending for LTC.  Planned Discharge Destination: Rehab     Time spent: 38 minutes  Author: Concepcion Riser, MD 09/17/2024 3:29 PM Secure chat 7am to 7pm For on call review [email or web address]    "

## 2024-09-17 NOTE — Plan of Care (Signed)
   Problem: Education: Goal: Knowledge of disease or condition will improve Outcome: Progressing Goal: Knowledge of secondary prevention will improve (MUST DOCUMENT ALL) Outcome: Progressing Goal: Knowledge of patient specific risk factors will improve (DELETE if not current risk factor) Outcome: Progressing

## 2024-09-18 DIAGNOSIS — I639 Cerebral infarction, unspecified: Secondary | ICD-10-CM | POA: Diagnosis not present

## 2024-09-18 NOTE — Plan of Care (Signed)
 Problem: Education: Goal: Knowledge of disease or condition will improve Outcome: Progressing Goal: Knowledge of secondary prevention will improve (MUST DOCUMENT ALL) Outcome: Progressing Goal: Knowledge of patient specific risk factors will improve (DELETE if not current risk factor) Outcome: Progressing   Problem: Ischemic Stroke/TIA Tissue Perfusion: Goal: Complications of ischemic stroke/TIA will be minimized Outcome: Progressing   Problem: Coping: Goal: Will verbalize positive feelings about self Outcome: Progressing Goal: Will identify appropriate support needs Outcome: Progressing   Problem: Health Behavior/Discharge Planning: Goal: Ability to manage health-related needs will improve Outcome: Progressing Goal: Goals will be collaboratively established with patient/family Outcome: Progressing   Problem: Self-Care: Goal: Ability to participate in self-care as condition permits will improve Outcome: Progressing Goal: Verbalization of feelings and concerns over difficulty with self-care will improve Outcome: Progressing Goal: Ability to communicate needs accurately will improve Outcome: Progressing   Problem: Nutrition: Goal: Risk of aspiration will decrease Outcome: Progressing Goal: Dietary intake will improve Outcome: Progressing   Problem: Education: Goal: Ability to describe self-care measures that may prevent or decrease complications (Diabetes Survival Skills Education) will improve Outcome: Progressing Goal: Individualized Educational Video(s) Outcome: Progressing   Problem: Coping: Goal: Ability to adjust to condition or change in health will improve Outcome: Progressing   Problem: Fluid Volume: Goal: Ability to maintain a balanced intake and output will improve Outcome: Progressing   Problem: Health Behavior/Discharge Planning: Goal: Ability to identify and utilize available resources and services will improve Outcome: Progressing Goal: Ability to  manage health-related needs will improve Outcome: Progressing   Problem: Metabolic: Goal: Ability to maintain appropriate glucose levels will improve Outcome: Progressing   Problem: Nutritional: Goal: Maintenance of adequate nutrition will improve Outcome: Progressing Goal: Progress toward achieving an optimal weight will improve Outcome: Progressing   Problem: Skin Integrity: Goal: Risk for impaired skin integrity will decrease Outcome: Progressing   Problem: Tissue Perfusion: Goal: Adequacy of tissue perfusion will improve Outcome: Progressing   Problem: Education: Goal: Knowledge of General Education information will improve Description: Including pain rating scale, medication(s)/side effects and non-pharmacologic comfort measures Outcome: Progressing   Problem: Health Behavior/Discharge Planning: Goal: Ability to manage health-related needs will improve Outcome: Progressing   Problem: Clinical Measurements: Goal: Ability to maintain clinical measurements within normal limits will improve Outcome: Progressing Goal: Will remain free from infection Outcome: Progressing Goal: Diagnostic test results will improve Outcome: Progressing Goal: Respiratory complications will improve Outcome: Progressing Goal: Cardiovascular complication will be avoided Outcome: Progressing   Problem: Activity: Goal: Risk for activity intolerance will decrease Outcome: Progressing   Problem: Nutrition: Goal: Adequate nutrition will be maintained Outcome: Progressing   Problem: Coping: Goal: Level of anxiety will decrease Outcome: Progressing   Problem: Elimination: Goal: Will not experience complications related to bowel motility Outcome: Progressing Goal: Will not experience complications related to urinary retention Outcome: Progressing   Problem: Education: Goal: Knowledge of disease or condition will improve Outcome: Progressing Goal: Knowledge of secondary prevention will  improve (MUST DOCUMENT ALL) Outcome: Progressing Goal: Knowledge of patient specific risk factors will improve (DELETE if not current risk factor) Outcome: Progressing   Problem: Ischemic Stroke/TIA Tissue Perfusion: Goal: Complications of ischemic stroke/TIA will be minimized Outcome: Progressing   Problem: Coping: Goal: Will verbalize positive feelings about self Outcome: Progressing Goal: Will identify appropriate support needs Outcome: Progressing   Problem: Health Behavior/Discharge Planning: Goal: Ability to manage health-related needs will improve Outcome: Progressing Goal: Goals will be collaboratively established with patient/family Outcome: Progressing   Problem: Self-Care: Goal: Ability to participate in  self-care as condition permits will improve Outcome: Progressing Goal: Verbalization of feelings and concerns over difficulty with self-care will improve Outcome: Progressing Goal: Ability to communicate needs accurately will improve Outcome: Progressing   Problem: Nutrition: Goal: Risk of aspiration will decrease Outcome: Progressing Goal: Dietary intake will improve Outcome: Progressing

## 2024-09-18 NOTE — Progress Notes (Signed)
 " PROGRESS NOTE  Matthew Roman FMW:[id number] DOB: July 12, 1956 DOA: 09/02/2024 [facility]: [facility]   LOS: 11 days   Brief narrative:  Brief hospital course: Matthew Roman is a 68 y.o. male with past medical history of stroke, ensuing dementia, multiple TIAs, cirrhosis of liver, diabetes, hyperlipidemia, prior history of hepatic encephalopathy, tobacco use, sleep apnea, who lives with a caretaker was brought in for evaluation of sudden onset of confusion and left-sided neglec.  EMS also noted to him to be confused, and activated code stroke.  MRI brain showed early sub acute stroke. Neurology advised DAPT, statin. SNF denied by insurance, awaiting [facility] approval for LTC.  Assessment/Plan: Principal Problem:   Acute stroke due to ischemia [facility]) Active Problems:   Hyperlipidemia   Vascular dementia, unspecified severity, without behavioral disturbance, psychotic disturbance, mood disturbance, and anxiety (HCC)   AMS (altered mental status)   HTN (hypertension)   Malnutrition of moderate degree   Acute metabolic encephalopathy secondary to acute ischemic stroke:  Initial CT head negative, CTA head and neck negative for LVO, MRI brain eventually showed small acute or early subacute infarction involving posterior right frontal lobe subcortical white matter.  Continue dual antiplatelets statins.  Echocardiogram did not show any evidence of PFO. Seen by PT OT who recommend CIR. But as he was denied for CIR as he has no 24/7 care at home.   Peer to peer done - denied for SNF. No family available to take care 24/7. TOC working on long term care facility with [facility]. VA screening checklist signed.   Essential hypertension:  Blood pressure better on Norvasc .  On as needed hydralazine    Type II Diabetes mellitus, non-insulin -dependent:  Review of recent hemoglobin A1c 5.5. Blood sugars well-controlled, off SSI.   Hyperlipidemia: Continue Lipitor   Tobacco abuse:  Has been counseled to quit    Chronic thrombocytopenia: Stable.  No recent labs  DVT prophylaxis: heparin  injection 5,000 Units Start: 09/03/24 0600   Disposition: Awaiting for disposition  Status is: Inpatient Remains inpatient appropriate because: Awaiting for disposition    Code Status:     Code Status: Full Code  Family Communication: None at bedside  Consultants: Neurology  Procedures: None  Anti-infectives:  None  Anti-infectives (From admission, onward)    None        Subjective: Today, patient was seen and examined at bedside.  Patient states that he feels okay.  Denies any nausea vomiting headache dizziness lightheadedness.  Objective: Vitals:   09/18/24 0800 09/18/24 1150  BP: 100/75 119/65  Pulse: 87 (!) 58  Resp: 18 17  Temp: 97.6 F (36.4 C) 98.7 F (37.1 C)  SpO2: 100% 100%   No intake or output data in the 24 hours ending 09/18/24 1215 Filed Weights   09/02/24 1440  Weight: 80.2 kg   Body mass index is 23.98 kg/m.   Physical Exam: GENERAL: Patient is alert awake and oriented. Not in obvious distress. HENT: No scleral pallor or icterus. Pupils equally reactive to light. Oral mucosa is moist NECK: is supple, no gross swelling noted. CHEST: Clear to auscultation. No crackles or wheezes.  Diminished breath sounds bilaterally. CVS: S1 and S2 heard, no murmur. Regular rate and rhythm.  ABDOMEN: Soft, non-tender, bowel sounds are present. EXTREMITIES: No edema. CNS: Cranial nerves are intact.  Generalized weakness noted SKIN: warm and dry without rashes.  Data Review: I have personally reviewed the following laboratory data and studies,  CBC: No results for input(s): WBC, NEUTROABS, HGB, HCT, MCV,  PLT in the last 168 hours. Basic Metabolic Panel: No results for input(s): NA, K, CL, CO2, GLUCOSE, BUN, CREATININE, CALCIUM , MG, PHOS in the last 168 hours. Liver Function Tests: No results for input(s): AST, ALT, ALKPHOS,  BILITOT, PROT, ALBUMIN in the last 168 hours. No results for input(s): LIPASE, AMYLASE in the last 168 hours. No results for input(s): AMMONIA in the last 168 hours. Cardiac Enzymes: No results for input(s): CKTOTAL, CKMB, CKMBINDEX, TROPONINI in the last 168 hours. BNP (last 3 results) Recent Labs    10/12/23 1700  BNP 41.7    ProBNP (last 3 results) No results for input(s): PROBNP in the last 8760 hours.  CBG: No results for input(s): GLUCAP in the last 168 hours. No results found for this or any previous visit (from the past 240 hours).   Studies: No results found.    Vernal Alstrom, MD  [facility] 09/18/2024  If 7PM-7AM, please contact night-coverage             "

## 2024-09-18 NOTE — Plan of Care (Signed)
  Problem: Education: Goal: Knowledge of disease or condition will improve Outcome: Adequate for Discharge Goal: Knowledge of secondary prevention will improve (MUST DOCUMENT ALL) Outcome: Adequate for Discharge Goal: Knowledge of patient specific risk factors will improve (DELETE if not current risk factor) Outcome: Adequate for Discharge

## 2024-09-18 NOTE — Hospital Course (Signed)
 [name]

## 2024-09-19 DIAGNOSIS — I639 Cerebral infarction, unspecified: Secondary | ICD-10-CM | POA: Diagnosis not present

## 2024-09-19 LAB — CBC
HCT: 45 % (ref 39.0–52.0)
Hemoglobin: 15.2 g/dL (ref 13.0–17.0)
MCH: 30 pg (ref 26.0–34.0)
MCHC: 33.8 g/dL (ref 30.0–36.0)
MCV: 88.8 fL (ref 80.0–100.0)
Platelets: 144 K/uL — ABNORMAL LOW (ref 150–400)
RBC: 5.07 MIL/uL (ref 4.22–5.81)
RDW: 15.6 % — ABNORMAL HIGH (ref 11.5–15.5)
WBC: 5.2 K/uL (ref 4.0–10.5)
nRBC: 0 % (ref 0.0–0.2)

## 2024-09-19 LAB — BASIC METABOLIC PANEL WITH GFR
Anion gap: 10 (ref 5–15)
BUN: 23 mg/dL (ref 8–23)
CO2: 26 mmol/L (ref 22–32)
Calcium: 9.8 mg/dL (ref 8.9–10.3)
Chloride: 103 mmol/L (ref 98–111)
Creatinine, Ser: 1.05 mg/dL (ref 0.61–1.24)
GFR, Estimated: >60 mL/min
Glucose, Bld: 122 mg/dL — ABNORMAL HIGH (ref 70–99)
Potassium: 4.7 mmol/L (ref 3.5–5.1)
Sodium: 139 mmol/L (ref 135–145)

## 2024-09-19 NOTE — Progress Notes (Signed)
 Physical Therapy Treatment and Progress Note Patient Details Name: Matthew Roman MRN: [id number] DOB: 1956/07/23 Today's Date: 09/19/2024   History of Present Illness 68 y/o M presenting to ED On 12/12 with aphasia and L neglect, code stroke activated. MRI with small punctate areas of restricted diffusion in R cerebral hemisphere.    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    PT Comments  Pt is continuing to progress towards goals. Goals updated and reviewed this session as needed. Pt has met goals and all goals updated. Pt currently is a high fall risk; focus of session today on high level balance activities including retro gait, side stepping and tandem gait. Gait training with focus on improved stepping to decrease shuffling and risk for falls. Due to pt current functional status, home set up and available assistance at home recommending skilled physical therapy services > 3 hours/day in order to address strength, balance and functional mobility to decrease risk for falls, injury, immobility, skin break down and re-hospitalization.      If plan is discharge home, recommend the following: A little help with walking and/or transfers;A little help with bathing/dressing/bathroom;Assistance with cooking/housework;Direct supervision/assist for medications management;Direct supervision/assist for financial management;Assist for transportation;Help with stairs or ramp for entrance;Supervision due to cognitive status     Equipment Recommendations  None recommended by PT       Precautions / Restrictions Precautions Precautions: Fall Recall of Precautions/Restrictions: Impaired Restrictions Weight Bearing Restrictions Per Provider Order: No     Mobility  Bed Mobility   General bed mobility comments: Pt found walking in the hall    Transfers Overall transfer level: Needs assistance Equipment used: None Transfers:  Sit to/from Stand Sit to Stand: Supervision           General transfer comment: stood from chair, reliant on arm rests to power up    Ambulation/Gait Ambulation/Gait assistance: Contact guard assist, Supervision Gait Distance (Feet): 300 Feet Assistive device: None Gait Pattern/deviations: Step-through pattern, Decreased stance time - left, Decreased stride length, Shuffle Gait velocity: decreased Gait velocity interpretation: <1.31 ft/sec, indicative of household ambulator   General Gait Details: very slow pace, no overt LOB this session. COntinues with shuffling gait worked on gait pattern with use of tiles for larger/wider/higher steps with good results using a visual. Pt with reciprocal gait pattern with improved step height/length with 4 laps of 30 ft with cues/Min A for wgt shifting.     Balance Overall balance assessment: Needs assistance Sitting-balance support: Feet supported Sitting balance-Leahy Scale: Normal Sitting balance - Comments: sitting unsupported in recliner chair without overt LOB noted   Standing balance support: During functional activity Standing balance-Leahy Scale: Fair Standing balance comment: no overt LOB observed; very slow pace during mobility               High Level Balance Comments: worked on side stepping 4 laps bil , retro gait 4 laps, modified tandem gait for 2 laps            Communication Communication Communication: No apparent difficulties Factors Affecting Communication: Difficulty expressing self  Cognition Arousal: Alert Behavior During Therapy: Flat affect   PT - Cognitive impairments: No family/caregiver present to determine baseline, Orientation, Awareness, Memory, Attention, Initiation, Sequencing, Problem solving, Safety/Judgement   Orientation impairments: Place, Time, Situation     PT - Cognition Comments: pt was better at following multi step directions today Following commands: Impaired Following commands  impaired: Follows multi-step commands with increased time    Cueing Cueing Techniques: Verbal cues, Gestural cues         Pertinent Vitals/Pain Pain Assessment Pain Assessment: No/denies pain     PT Goals (current goals can now be found in the care plan section) Acute Rehab PT Goals Patient Stated Goal: Go home PT Goal Formulation: Patient unable to participate in goal setting Time For Goal Achievement: 10/03/24 Potential to Achieve Goals: Good Additional Goals Additional Goal #1: Improve DGI >7 points to show clinically significant improvement in balance and score 45/54 on the BERG balance in order to demonstrate a decreased risk for falls. Progress towards PT goals: Progressing toward goals    Frequency    Min 2X/week      PT Plan  Continue with current POC     AM-PAC PT 6 Clicks Mobility   Outcome Measure  Help needed turning from your back to your side while in a flat bed without using bedrails?: None Help needed moving from lying on your back to sitting on the side of a flat bed without using bedrails?: None Help needed moving to and from a bed to a chair (including a wheelchair)?: A Little Help needed standing up from a chair using your arms (e.g., wheelchair or bedside chair)?: A Little Help needed to walk in hospital room?: A Little Help needed climbing 3-5 steps with a railing? : A Little 6 Click Score: 20    End of Session Equipment Utilized During Treatment: Gait belt Activity Tolerance: Patient tolerated treatment well Patient left: in chair;with call bell/phone within reach Nurse Communication: Mobility status PT Visit Diagnosis: Unsteadiness on feet (R26.81);Other abnormalities of gait and mobility (R26.89);Difficulty in walking, not elsewhere classified (R26.2);Other symptoms and signs involving the nervous system (R29.898)     Time: 8669-8646 PT Time Calculation (min) (ACUTE ONLY): 23 min  Charges:    $Gait Training: 8-22  mins $Neuromuscular Re-education: 8-22 mins PT General Charges $$ ACUTE PT VISIT: 1 Visit                    Dorothyann Maier, DPT, CLT  Acute Rehabilitation Services Office: [phone] (Secure chat preferred)    Dorothyann VEAR Maier 09/19/2024, 2:11 PM

## 2024-09-19 NOTE — Progress Notes (Addendum)
 " PROGRESS NOTE  Matthew Roman FMW:[id number] DOB: [birth year] DOA: 09/02/2024 [facility]: [facility]   LOS: 12 days   Brief narrative:  Matthew Roman is a 68 y.o. male with past medical history of stroke, ensuing dementia, multiple TIAs, cirrhosis of liver, diabetes, hyperlipidemia, prior history of hepatic encephalopathy, tobacco use, sleep apnea, who lives with a caretaker was brought in for evaluation of sudden onset of confusion and left-sided neglect.  EMS also noted to him to be confused, and activated code stroke.  MRI brain showed early sub acute stroke. Neurology advised DAPT, statin. SNF denied by insurance, awaiting [facility] approval for LTC.  Assessment/Plan: Principal Problem:   Acute stroke due to ischemia [facility]) Active Problems:   Hyperlipidemia   Vascular dementia, unspecified severity, without behavioral disturbance, psychotic disturbance, mood disturbance, and anxiety (HCC)   AMS (altered mental status)   HTN (hypertension)   Malnutrition of moderate degree   Acute metabolic encephalopathy secondary to acute ischemic stroke:  Cognitive impairement/Vascular dementia  Initial CT head negative, CTA head and neck negative for LVO, MRI brain eventually showed small acute or early subacute infarction involving posterior right frontal lobe subcortical white matter.  Continue dual antiplatelets, statins.  2 D Echocardiogram did not show any evidence of PFO. Seen by PT OT who recommend CIR. But as he was denied for CIR as he has no 24/7 care at home.   Peer to peer done - denied for SNF. No family available to take care 24/7. TOC working on long term care facility with [facility]. VA screening checklist signed.   Essential hypertension:  Blood pressure better on Norvasc .  On as needed hydralazine    Type II Diabetes mellitus, non-insulin -dependent:  Review of recent hemoglobin A1c 5.5. Blood sugars well-controlled, off SSI.   Hyperlipidemia: Continue Lipitor   Tobacco abuse:  Has  been counseled to quit   Chronic thrombocytopenia: Stable.  No recent labs   DVT prophylaxis: heparin  injection 5,000 Units Start: 09/03/24 0600   Disposition: Awaiting for disposition.  Medically stable for disposition.  TOC working on long-term care/memory care placement at the [facility].  Status is: Inpatient  Remains inpatient appropriate because: Awaiting for disposition    Code Status:     Code Status: Full Code  Family Communication: None at bedside. Spoke with the patients granddaughter and updated her about the clinical condition of the patient.   Consultants: Neurology  Procedures: None  Anti-infectives:  None  Anti-infectives (From admission, onward)    None       Subjective: Today, patient was seen and examined at bedside.  Patient denies interval complaints.  Denies any nausea vomiting fever chills or shortness of breath.  Objective: Vitals:   09/18/24 2321 09/19/24 0755  BP: (!) 104/90 (!) 130/92  Pulse: 63 (!) 59  Resp: 16 18  Temp: 97.8 F (36.6 C) 97.7 F (36.5 C)  SpO2: 97% 100%    Intake/Output Summary (Last 24 hours) at 09/19/2024 1058 Last data filed at 09/19/2024 0231 Gross per 24 hour  Intake 240 ml  Output --  Net 240 ml   Filed Weights   09/02/24 1440  Weight: 80.2 kg   Body mass index is 23.98 kg/m.   Physical Exam:  GENERAL: Patient is alert awake and Communicative not in obvious distress. HENT: No scleral pallor or icterus. Pupils equally reactive to light. Oral mucosa is moist NECK: is supple, no gross swelling noted. CHEST: Decreased breath sounds bilaterally CVS: S1 and S2 heard, no murmur. Regular  rate and rhythm.  ABDOMEN: Soft, non-tender, bowel sounds are present. EXTREMITIES: No edema. CNS: Cranial nerves are intact.  Generalized weakness noted SKIN: warm and dry without rashes.  Data Review: I have personally reviewed the following laboratory data and studies,  CBC: Recent Labs  Lab 09/19/24 0650   WBC 5.2  HGB 15.2  HCT 45.0  MCV 88.8  PLT 144*   Basic Metabolic Panel: Recent Labs  Lab 09/19/24 0650  NA 139  K 4.7  CL 103  CO2 26  GLUCOSE 122*  BUN 23  CREATININE 1.05  CALCIUM  9.8   Liver Function Tests: No results for input(s): AST, ALT, ALKPHOS, BILITOT, PROT, ALBUMIN in the last 168 hours. No results for input(s): LIPASE, AMYLASE in the last 168 hours. No results for input(s): AMMONIA in the last 168 hours. Cardiac Enzymes: No results for input(s): CKTOTAL, CKMB, CKMBINDEX, TROPONINI in the last 168 hours. BNP (last 3 results) Recent Labs    10/12/23 1700  BNP 41.7    ProBNP (last 3 results) No results for input(s): PROBNP in the last 8760 hours.  CBG: No results for input(s): GLUCAP in the last 168 hours. No results found for this or any previous visit (from the past 240 hours).   Studies: No results found.    Vernal Alstrom, MD  [facility] 09/19/2024  If 7PM-7AM, please contact night-coverage   "

## 2024-09-20 DIAGNOSIS — I639 Cerebral infarction, unspecified: Secondary | ICD-10-CM | POA: Diagnosis not present

## 2024-09-20 NOTE — Plan of Care (Signed)
" °  Problem: Education: Goal: Knowledge of disease or condition will improve Outcome: Progressing   Problem: Ischemic Stroke/TIA Tissue Perfusion: Goal: Complications of ischemic stroke/TIA will be minimized Outcome: Progressing   Problem: Coping: Goal: Will verbalize positive feelings about self Outcome: Progressing Goal: Will identify appropriate support needs Outcome: Progressing   Problem: Health Behavior/Discharge Planning: Goal: Ability to manage health-related needs will improve Outcome: Progressing   Problem: Self-Care: Goal: Ability to participate in self-care as condition permits will improve Outcome: Progressing   Problem: Nutrition: Goal: Risk of aspiration will decrease Outcome: Progressing   Problem: Coping: Goal: Will verbalize positive feelings about self Outcome: Progressing   "

## 2024-09-20 NOTE — Progress Notes (Addendum)
 " PROGRESS NOTE  Matthew Roman FMW:[id number] DOB: 07-Jul-1956 DOA: 09/02/2024 [facility]: [facility]   LOS: 13 days   Brief narrative:  Matthew Roman is a 68 y.o. male with past medical history of stroke, ensuing dementia, multiple TIAs, cirrhosis of liver, diabetes, hyperlipidemia, prior history of hepatic encephalopathy, tobacco use, sleep apnea, who lives with a caretaker was brought in for evaluation of sudden onset of confusion and left-sided neglect.  EMS also noted to him to be confused, and activated code stroke.  MRI brain showed early sub acute stroke. Neurology advised DAPT, statin.  At this time patient has remained stable.  PT has recommended skilled nursing facility but SNF denied by insurance, awaiting [facility] approval for LTC.  Assessment/Plan: Principal Problem:   Acute stroke due to ischemia [facility]) Active Problems:   Hyperlipidemia   Vascular dementia, unspecified severity, without behavioral disturbance, psychotic disturbance, mood disturbance, and anxiety (HCC)   AMS (altered mental status)   HTN (hypertension)   Malnutrition of moderate degree   Acute metabolic encephalopathy secondary to acute ischemic stroke:  Cognitive impairement/Vascular dementia  Initial CT head negative, CTA head and neck negative for LVO, MRI brain eventually showed small acute or early subacute infarction involving posterior right frontal lobe subcortical white matter.  Continue dual antiplatelets, statins.  2 D Echocardiogram did not show any evidence of PFO. Seen by PT, OT who recommend CIR. But as he was denied for CIR as he has no 24/7 care at home.   Peer to peer done by previous provider- denied for SNF. No family available to take care 24/7. TOC working on long term care facility with [facility]. VA screening checklist signed.  TOC aware.   Essential hypertension:  Blood pressure better on Norvasc .  On as needed hydralazine .     Type II Diabetes mellitus, non-insulin -dependent:  Review of  recent hemoglobin A1c 5.5. Blood sugars well-controlled, off SSI.   Hyperlipidemia: Continue Lipitor   Tobacco abuse:  Has been counseled to quit   Chronic thrombocytopenia: Stable.  No recent labs   Nutrition Status:Body mass index is 23.98 kg/m.  Nutrition Problem: Moderate Malnutrition Etiology: social / environmental circumstances Signs/Symptoms: mild fat depletion, mild muscle depletion Interventions: Ensure Enlive (each supplement provides 350kcal and 20 grams of protein), MVI    DVT prophylaxis: heparin  injection 5,000 Units Start: 09/03/24 0600   Disposition:  Awaiting for disposition.  Medically stable for disposition.  TOC working on long-term care/memory care placement at the [facility].  Status is: Inpatient  Remains inpatient appropriate because: Awaiting for disposition    Code Status:     Code Status: Full Code  Family Communication:  Spoke with the patients granddaughter on 09/19/2024  Consultants: Neurology  Procedures: None  Anti-infectives:  None  Anti-infectives (From admission, onward)    None       Subjective: Today, patient was seen and examined at bedside.  Patient states that he feels okay.  Denies dizziness lightheadedness nausea vomiting shortness of breath or dyspnea.   Objective: Vitals:   09/20/24 0429 09/20/24 0855  BP: (!) 146/64 118/71  Pulse: 60 91  Resp:    Temp: 97.7 F (36.5 C)   SpO2: 97% 98%    Intake/Output Summary (Last 24 hours) at 09/20/2024 1039 Last data filed at 09/19/2024 1300 Gross per 24 hour  Intake 236 ml  Output --  Net 236 ml   Filed Weights   09/02/24 1440  Weight: 80.2 kg   Body mass index is 23.98 kg/m.  Physical Exam:  General:  Average built, not in obvious distress HENT:   No scleral pallor or icterus noted. Oral mucosa is moist.  Chest: Decreased breath sounds bilaterally. CVS: S1 &S2 heard. No murmur.  Regular rate and rhythm. Abdomen: Soft, nontender, nondistended.   Bowel sounds are heard.   Extremities: No cyanosis, clubbing or edema.  Peripheral pulses are palpable. Psych: Alert, awake and oriented, normal mood CNS:  No cranial nerve deficits.  Generalized weakness noted Skin: Warm and dry.  No rashes noted.   Data Review: I have personally reviewed the following laboratory data and studies,  CBC: Recent Labs  Lab 09/19/24 0650  WBC 5.2  HGB 15.2  HCT 45.0  MCV 88.8  PLT 144*   Basic Metabolic Panel: Recent Labs  Lab 09/19/24 0650  NA 139  K 4.7  CL 103  CO2 26  GLUCOSE 122*  BUN 23  CREATININE 1.05  CALCIUM  9.8   Liver Function Tests: No results for input(s): AST, ALT, ALKPHOS, BILITOT, PROT, ALBUMIN in the last 168 hours. No results for input(s): LIPASE, AMYLASE in the last 168 hours. No results for input(s): AMMONIA in the last 168 hours. Cardiac Enzymes: No results for input(s): CKTOTAL, CKMB, CKMBINDEX, TROPONINI in the last 168 hours. BNP (last 3 results) Recent Labs    10/12/23 1700  BNP 41.7    ProBNP (last 3 results) No results for input(s): PROBNP in the last 8760 hours.  CBG: No results for input(s): GLUCAP in the last 168 hours. No results found for this or any previous visit (from the past 240 hours).   Studies: No results found.    Kysha Muralles, MD  [facility] 09/20/2024  If 7PM-7AM, please contact night-coverage   "

## 2024-09-20 NOTE — Progress Notes (Signed)
 Nutrition Follow-up  DOCUMENTATION CODES:  Non-severe (moderate) malnutrition in context of social or environmental circumstances   INTERVENTION:  Continue heart-healthy diet. Continue Ensure Plus High Protein PO to TID. Each supplement provides 350 Kcals and 20 grams of protein. Continue Multivitamin PO once daily.  NUTRITION DIAGNOSIS:  Moderate Malnutrition related to social / environmental circumstances as evidenced by mild fat depletion, mild muscle depletion - remains applicable  GOAL:  Patient will meet greater than or equal to 90% of their needs - currently being met with consistently 100% meal intake.  MONITOR:  PO intake, Supplement acceptance, Labs, Weight trends  REASON FOR ASSESSMENT:  Follow-up for: Consult Assessment of nutrition requirement/status  ASSESSMENT:  Patient presented with confusion and L-sided neglect and was found to have acute ischemic stroke. PMH significant for stroke with dementia, multiple TIAs, liver cirrhosis, DM2, dyslipidemia, hepatic encephalopathy, tobacco, OSA, lives with caretaker.  The patient continues to have 100% meal intake. He is medically stable for discharge pending [facility] approval for long-term/memory care facility.   Scheduled Meds:   stroke: early stages of recovery book   Does not apply Once   amLODipine   5 mg Oral Daily   aspirin   81 mg Oral Daily   atorvastatin   40 mg Oral q1800   clopidogrel   75 mg Oral Daily   docusate sodium   100 mg Oral BID   famotidine   20 mg Oral BID   feeding supplement  237 mL Oral TID BM   heparin   5,000 Units Subcutaneous Q8H   multivitamin with minerals  1 tablet Oral Daily   polyethylene glycol  17 g Oral Daily   senna  2 tablet Oral QHS   sodium chloride  flush  3 mL Intravenous Once   Continuous Infusions: PRN Meds:.acetaminophen  **OR** acetaminophen  (TYLENOL ) oral liquid 160 mg/5 mL **OR** acetaminophen , albuterol , hydrALAZINE   Diet Order             Diet Heart Room service  appropriate? Yes; Fluid consistency: Thin  Diet effective now                  Meal Intake: 100% consistently  Labs:     Latest Ref Rng & Units 09/19/2024    6:50 AM 09/07/2024   10:48 AM 09/06/2024   11:31 AM  CMP  Glucose 70 - 99 mg/dL 877   886   BUN 8 - 23 mg/dL 23   16   Creatinine 9.38 - 1.24 mg/dL 8.94   9.16   Sodium 864 - 145 mmol/L 139   140   Potassium 3.5 - 5.1 mmol/L 4.7  4.7  5.2   Chloride 98 - 111 mmol/L 103   102   CO2 22 - 32 mmol/L 26   28   Calcium  8.9 - 10.3 mg/dL 9.8   9.6     I/O: +5.6 L since admit  NUTRITION - FOCUSED PHYSICAL EXAM: Flowsheet Row Most Recent Value  Orbital Region No depletion  Upper Arm Region Mild depletion  Thoracic and Lumbar Region No depletion  Buccal Region Mild depletion  Temple Region Moderate depletion  Clavicle Bone Region No depletion  Clavicle and Acromion Bone Region Mild depletion  Scapular Bone Region Moderate depletion  Dorsal Hand Moderate depletion  Patellar Region Severe depletion  Anterior Thigh Region Moderate depletion  Posterior Calf Region Severe depletion  Edema (RD Assessment) None  Hair Reviewed  Eyes Reviewed  Mouth Reviewed  Skin Reviewed  Nails Reviewed    EDUCATION NEEDS:  Education needs  have been addressed  Skin:  Skin Assessment: Reviewed RN Assessment  Last BM:  12/29 type 4  Height:  Ht Readings from Last 1 Encounters:  09/04/24 6' (1.829 m)   Weight:  Wt Readings from Last 10 Encounters:  09/02/24 80.2 kg  08/04/22 76.5 kg  03/07/22 76.5 kg  08/02/19 80.3 kg  10/26/18 82.7 kg  09/02/18 86.2 kg   Weight Change: patient reports unknown amount of weight loss PTA  Usual Body Weight: patient is unable to determine  Edema: none  Ideal Body Weight:  81 kg   BMI:  Body mass index is 23.98 kg/m.  Estimated Daily Nutritional Needs:  Kcal:  2000-2200 Protein:  100-120 g Fluid:  >/=2000 mL    Matthew Ruth, MS, RDN, LDN [place] See AMION for  contact information Secure chat preferred

## 2024-09-20 NOTE — Progress Notes (Signed)
 Occupational Therapy Treatment Patient Details Name: Matthew Roman MRN: [id number] DOB: 1956-05-01 Today's Date: 09/20/2024   History of present illness 68 y/o M presenting to ED On 12/12 with aphasia and L neglect, code stroke activated. MRI with small punctate areas of restricted diffusion in R cerebral hemisphere.    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   OT comments  Patient in recliner upon entry and had blood on gown from open scab.  Open scab addressed and patient agreed to change into paper scrubs with supervision to perform and patient demonstrating slight LOB while threading legs into pants while standing. Patient demonstrated functional transfers with supervision.  Retrieving tasks performed in room to address command following with patient able to follow 2 step commands with min verbal cues. Patient will benefit from continued inpatient follow up therapy, <3 hours/day.  Acute OT to continue to follow to address established goals to facilitate DC to next venue of care.        If plan is discharge home, recommend the following:  A little help with walking and/or transfers;Assistance with cooking/housework;Direct supervision/assist for financial management;Direct supervision/assist for medications management;Assist for transportation;Supervision due to cognitive status;Help with stairs or ramp for entrance;A little help with bathing/dressing/bathroom   Equipment Recommendations  Other (comment) (defer)    Recommendations for Other Services      Precautions / Restrictions Precautions Precautions: Fall Recall of Precautions/Restrictions: Impaired Restrictions Weight Bearing Restrictions Per Provider Order: No       Mobility Bed Mobility Overal bed mobility: Modified Independent             General bed mobility comments: patient in recliner upon entry    Transfers Overall transfer level:  Needs assistance Equipment used: None Transfers: Sit to/from Stand Sit to Stand: Supervision           General transfer comment: supervision for safety     Balance Overall balance assessment: Needs assistance Sitting-balance support: Feet supported Sitting balance-Leahy Scale: Normal     Standing balance support: During functional activity Standing balance-Leahy Scale: Fair Standing balance comment: no UE support, unsteady when threading legs into pants while standing                           ADL either performed or assessed with clinical judgement   ADL Overall ADL's : Needs assistance/impaired                 Upper Body Dressing : Supervision/safety;Standing Upper Body Dressing Details (indicate cue type and reason): paper scrub top Lower Body Dressing: Set up;Supervision/safety;Sitting/lateral leans Lower Body Dressing Details (indicate cue type and reason): donned paper scrub pants and socks with supervision while standing Toilet Transfer: Supervision/safety;Ambulation       Tub/ Shower Transfer: Walk-in shower;Supervision/safety     General ADL Comments: supervision for safety with self care tasks    Extremity/Trunk Insurance Account Manager Communication Communication: No apparent difficulties   Cognition Arousal: Alert Behavior During Therapy: Flat affect Cognition: Cognition impaired   Orientation impairments: Place, Time, Situation Awareness: Intellectual awareness impaired, Online awareness impaired Memory impairment (select all impairments): Short-term memory, Working civil service fast streamer, Engineer, structural memory Attention impairment (select first level of impairment): Sustained attention Executive functioning impairment (  select all impairments): Problem solving, Reasoning, Sequencing, Organization, Initiation                   Following commands: Impaired Following  commands impaired: Follows multi-step commands with increased time      Cueing   Cueing Techniques: Verbal cues, Gestural cues  Exercises      Shoulder Instructions       General Comments performed retrieving tasks to address command following with patient able to follow 2 step commands consistently    Pertinent Vitals/ Pain       Pain Assessment Pain Assessment: No/denies pain  Home Living                                          Prior Functioning/Environment              Frequency  Min 2X/week        Progress Toward Goals  OT Goals(current goals can now be found in the care plan section)  Progress towards OT goals: Progressing toward goals     Plan      Co-evaluation                 AM-PAC OT 6 Clicks Daily Activity     Outcome Measure   Help from another person eating meals?: None Help from another person taking care of personal grooming?: A Little Help from another person toileting, which includes using toliet, bedpan, or urinal?: A Little Help from another person bathing (including washing, rinsing, drying)?: A Little Help from another person to put on and taking off regular upper body clothing?: A Little Help from another person to put on and taking off regular lower body clothing?: A Little 6 Click Score: 19    End of Session Equipment Utilized During Treatment: Gait belt  OT Visit Diagnosis: Unsteadiness on feet (R26.81);Other abnormalities of gait and mobility (R26.89);Muscle weakness (generalized) (M62.81);Other symptoms and signs involving cognitive function   Activity Tolerance Patient tolerated treatment well   Patient Left in chair;with call bell/phone within reach   Nurse Communication Mobility status        Time: 8997-8974 OT Time Calculation (min): 23 min  Charges: OT General Charges $OT Visit: 1 Visit OT Treatments $Self Care/Home Management : 23-37 mins  Dick Laine, OTA Acute Rehabilitation  Services  Office [phone]   Jeb LITTIE Laine 09/20/2024, 2:03 PM

## 2024-09-20 NOTE — Plan of Care (Signed)
  Problem: Education: Goal: Knowledge of disease or condition will improve Outcome: Adequate for Discharge Goal: Knowledge of secondary prevention will improve (MUST DOCUMENT ALL) Outcome: Adequate for Discharge Goal: Knowledge of patient specific risk factors will improve (DELETE if not current risk factor) Outcome: Adequate for Discharge

## 2024-09-21 DIAGNOSIS — I639 Cerebral infarction, unspecified: Secondary | ICD-10-CM | POA: Diagnosis not present

## 2024-09-21 NOTE — Progress Notes (Signed)
 "  PROGRESS NOTE    Matthew Roman  FMW:[id number] DOB: May 13, 1956 DOA: 09/02/2024 PCP: [facility]   Brief Narrative: Matthew Roman is a 68 y.o. male with a history of dementia, TIAs, cirrhosis, diabetes mellitus type 2, hyperlipidemia, hepatic encephalopathy, tobacco use, sleep apnea.  Patient presented secondary to confusion and left-sided neglect, found to have evidence of an acute stroke. Neurology consulted. Patient started on dual antiplatelet therapy with aspirin  and Plavix .   Assessment and Plan:  Acute stroke Patient presented with symptoms including confusion. Initial CT head (12/12) significant for no acute intracranial hemorrhage or acute territorial infarction. MRI brain (12/12) confirms a small acute vs early subacute infarction involving the posterior right frontal lobe subcortical white matter. CTA head and neck significant for no large vessel occlusion or hemodynamically significant stenosis. LDL of 58. Hemoglobin A1C of 5.5%. Transthoracic Echocardiogram significant for an LVEF of 60-65% with no atrial level shunt. Neurology recommendations (12/14) for Plavix  indefinitely and for three weeks of aspirin . PT/OT recommendations for acute inpatient rehabilitation. - Continue Aspirin  (21-day course) and Plavix   Acute metabolic encephalopathy Present on admission. Presumed secondary to patient's stroke and complicated by underlying dementia. EEG obtained and suggested generalized cerebral dysfunction without seizures or epileptiform discharges. Mental status improved.  Vascular dementia Cognitive impairment Noted.  Primary hypertension - Continue amlodipine   Diabetes mellitus type 2 Well controlled based on hemoglobin A1C of 5.5%.  Hyperlipidemia - Continue Lipitor  Tobacco abuse Counseled this admission.  Moderate malnutrition - Dietitian recommendations (12/30): Continue heart-healthy diet. Continue Ensure Plus High Protein PO to TID. Each supplement  provides 350 Kcals and 20 grams of protein. Continue Multivitamin PO once daily.   DVT prophylaxis: Subcutaneous heparin  Code Status:   Code Status: Full Code Family Communication: None at bedside Disposition Plan: Discharge to SNF   Consultants:  Neurology  Procedures:  Transthoracic Echocardiogram  Antimicrobials: None    Subjective: Patient reports no concerns this   Objective: BP 128/71 (BP Location: Left Arm)   Pulse 68   Temp 97.7 F (36.5 C) (Oral)   Resp 17   Ht 6' (1.829 m)   Wt 80.2 kg   SpO2 99%   BMI 23.98 kg/m   Examination:  General exam: Appears calm and comfortable. Respiratory system: Clear to auscultation. Respiratory effort normal. Cardiovascular system: S1 & S2 heard, RRR. No murmur. Gastrointestinal system: Abdomen is nondistended, soft and nontender. Normal bowel sounds heard. Central nervous system: Alert and oriented. Musculoskeletal: No edema. No calf tenderness Psychiatry: Judgement and insight appear normal. Mood & affect appropriate.    Data Reviewed: I have personally reviewed following labs and imaging studies  CBC Lab Results  Component Value Date   WBC 5.2 09/19/2024   RBC 5.07 09/19/2024   HGB 15.2 09/19/2024   HCT 45.0 09/19/2024   MCV 88.8 09/19/2024   MCH 30.0 09/19/2024   PLT 144 (L) 09/19/2024   MCHC 33.8 09/19/2024   RDW 15.6 (H) 09/19/2024   LYMPHSABS 0.7 09/06/2024   MONOABS 0.4 09/06/2024   EOSABS 0.1 09/06/2024   BASOSABS 0.0 09/06/2024     Last metabolic panel Lab Results  Component Value Date   NA 139 09/19/2024   K 4.7 09/19/2024   CL 103 09/19/2024   CO2 26 09/19/2024   BUN 23 09/19/2024   CREATININE 1.05 09/19/2024   GLUCOSE 122 (H) 09/19/2024   GFRNONAA >60 09/19/2024   GFRAA >60 09/06/2018   CALCIUM  9.8 09/19/2024   PROT 5.9 (L) 09/02/2024   ALBUMIN  3.6 09/02/2024   BILITOT 0.5 09/02/2024   ALKPHOS 96 09/02/2024   AST 24 09/02/2024   ALT 34 09/02/2024   ANIONGAP 10 09/19/2024     GFR: Estimated Creatinine Clearance: 72.9 mL/min (by C-G formula based on SCr of 1.05 mg/dL).  No results found for this or any previous visit (from the past 240 hours).    Radiology Studies: No results found.    LOS: 14 days    Elgin Lam, MD [facility] 09/21/2024, 2:40 PM   If 7PM-7AM, please contact night-coverage [email or web address]  "

## 2024-09-21 NOTE — Progress Notes (Signed)
 Physical Therapy Treatment Patient Details Name: Matthew Roman MRN: [id number] DOB: [birth year] Today's Date: 09/21/2024   History of Present Illness 68 y/o M presenting to ED On 12/12 with aphasia and L neglect, code stroke activated. MRI with small punctate areas of restricted diffusion in R cerebral hemisphere.    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    PT Comments  Pt continues to present with balance impairments and gt deviations.  LOB noted this session with Min assistance to correct.  He continues to benefit from aggressive rehab in a post acute setting to maximize functional gains and return home independently.       If plan is discharge home, recommend the following: A little help with walking and/or transfers;A little help with bathing/dressing/bathroom;Assistance with cooking/housework;Direct supervision/assist for medications management;Direct supervision/assist for financial management;Assist for transportation;Help with stairs or ramp for entrance;Supervision due to cognitive status   Can travel by private vehicle        Equipment Recommendations  None recommended by PT    Recommendations for Other Services Rehab consult     Precautions / Restrictions Precautions Precautions: Fall Recall of Precautions/Restrictions: Impaired Restrictions Weight Bearing Restrictions Per Provider Order: No     Mobility  Bed Mobility               General bed mobility comments: patient in recliner upon entry    Transfers Overall transfer level: Modified independent Equipment used: None                    Ambulation/Gait Ambulation/Gait assistance: Contact guard assist, Min assist Gait Distance (Feet): 400 Feet Assistive device: None Gait Pattern/deviations: Step-through pattern, Decreased stance time - left, Decreased stride length, Shuffle Gait velocity: decreased     General  Gait Details: Pt continues to move at decreased speed this session.  Pt required cues to increased B stride length and step height.  He presents with LOB x 2 when adjusting shuffling pattern.   Stairs             Wheelchair Mobility     Tilt Bed    Modified Rankin (Stroke Patients Only)       Balance     Sitting balance-Leahy Scale: Normal       Standing balance-Leahy Scale: Poor   Single Leg Stance - Right Leg: 15 Single Leg Stance - Left Leg: 3             Standardized Balance Assessment Standardized Balance Assessment : Dynamic Gait Index   Dynamic Gait Index Level Surface: Moderate Impairment Change in Gait Speed: Mild Impairment Gait with Horizontal Head Turns: Moderate Impairment Gait with Vertical Head Turns: Moderate Impairment Gait and Pivot Turn: Moderate Impairment Step Over Obstacle: Moderate Impairment Step Around Obstacles: Moderate Impairment Steps: Mild Impairment Total Score: 10      Communication Communication Communication: No apparent difficulties  Cognition Arousal: Alert Behavior During Therapy: Flat affect   PT - Cognitive impairments: No family/caregiver present to determine baseline, Orientation, Awareness, Memory, Attention, Initiation, Sequencing, Problem solving, Safety/Judgement                       PT - Cognition Comments: pt was better at following multi step directions today Following commands: Impaired Following commands impaired: Follows multi-step commands with increased time    Cueing Cueing Techniques: Verbal cues, Gestural cues  Exercises General Exercises - Lower  Extremity Hip Flexion/Marching: AROM, Both, 10 reps, Supine Heel Raises: AROM, Both, 10 reps, Supine Mini-Sqauts: AROM, Both, 10 reps, Supine    General Comments        Pertinent Vitals/Pain Pain Assessment Pain Assessment: No/denies pain    Home Living                          Prior Function            PT Goals  (current goals can now be found in the care plan section) Acute Rehab PT Goals Patient Stated Goal: Go home Potential to Achieve Goals: Good Additional Goals Additional Goal #1: Improve DGI >7 points to show clinically significant improvement in balance and score 45/54 on the BERG balance in order to demonstrate a decreased risk for falls. Progress towards PT goals: Progressing toward goals    Frequency    Min 2X/week      PT Plan      Co-evaluation              AM-PAC PT 6 Clicks Mobility   Outcome Measure  Help needed turning from your back to your side while in a flat bed without using bedrails?: None Help needed moving from lying on your back to sitting on the side of a flat bed without using bedrails?: None Help needed moving to and from a bed to a chair (including a wheelchair)?: A Little Help needed standing up from a chair using your arms (e.g., wheelchair or bedside chair)?: A Little Help needed to walk in hospital room?: A Little Help needed climbing 3-5 steps with a railing? : A Little 6 Click Score: 20    End of Session Equipment Utilized During Treatment: Gait belt Activity Tolerance: Patient tolerated treatment well Patient left: in chair;with call bell/phone within reach Nurse Communication: Mobility status PT Visit Diagnosis: Unsteadiness on feet (R26.81);Other abnormalities of gait and mobility (R26.89);Difficulty in walking, not elsewhere classified (R26.2);Other symptoms and signs involving the nervous system (R29.898)     Time: 8449-8392 PT Time Calculation (min) (ACUTE ONLY): 17 min  Charges:    $Gait Training: 8-22 mins PT General Charges $$ ACUTE PT VISIT: 1 Visit                     Toya HAMS , PTA Acute Rehabilitation Services Office [phone]    Denina Rieger JINNY Gosling 09/21/2024, 4:25 PM

## 2024-09-21 NOTE — TOC Progression Note (Addendum)
 Transition of Care [facility]) - Progression Note    Patient Details  Name: Matthew Roman MRN: [id number] Date of Birth: 23-Feb-1956  [facility] CM/SW Contact  [name], [place] Phone Number: 09/21/2024, 2:44 PM  Clinical Narrative:     Update 3:28 PM: Patient's caregiver is not agreeable to patient returning home with home health, as she will not be able to provide 24 hr care if patient comes home. CSW reached out to granddaughter, who said she is not able to take patient in either. According to granddaughter, patient receives $1595/month from social security, and asked if it was possible to place patient with his check. CSW will reach out to [facility] state homes in [place] to ask about rates and accepted insurances. Provider made aware. CSW will continue to follow.   [facility] reached out to CSW 12/30 after the holidays to ask for clarification regarding desired placement for patient. After discussion with both caregiver and granddaughter, they were agreeable to short term rehab and then discharge back home with caregiver once patient was more independent. CSW communicated patient and family's wishes to [place] child psychotherapist. After review by [facility], patient will likely need long term care placement, which the [facility] will not be able to help with due to patient not being service connected. [facility] social worker told CSW to reach out to [facility] social worker Niels Nottingham [phone]) to discuss options available to patient.   CSW called Niels Nottingham and briefly discussed patient's situation. Niels asked for patient's clinicals to be faxed to [phone]. CSW faxed clinicals for review on 12/30. [facility] office is currently closed due to the federal holiday, so CSW will reach out on Friday when offices open again.   Expected Discharge Plan: Skilled Nursing Facility Barriers to Discharge: Insurance Authorization               Expected Discharge Plan and Services       Living arrangements  for the past 2 months: Single Family Home                                       Social Drivers of Health (SDOH) Interventions SDOH Screenings   Food Insecurity: Patient Unable To Answer (09/03/2024)  Housing: Patient Unable To Answer (09/03/2024)  Transportation Needs: Patient Unable To Answer (09/03/2024)  Utilities: Patient Unable To Answer (09/03/2024)  Social Connections: Patient Unable To Answer (09/03/2024)  Tobacco Use: High Risk (09/03/2024)    Readmission Risk Interventions     No data to display

## 2024-09-21 NOTE — Plan of Care (Signed)
  Problem: Education: Goal: Knowledge of disease or condition will improve Outcome: Adequate for Discharge Goal: Knowledge of secondary prevention will improve (MUST DOCUMENT ALL) Outcome: Adequate for Discharge Goal: Knowledge of patient specific risk factors will improve (DELETE if not current risk factor) Outcome: Adequate for Discharge

## 2024-09-22 DIAGNOSIS — I639 Cerebral infarction, unspecified: Secondary | ICD-10-CM | POA: Diagnosis not present

## 2024-09-22 NOTE — Plan of Care (Signed)
" °  Problem: Education: Goal: Knowledge of disease or condition will improve Outcome: Progressing   Problem: Health Behavior/Discharge Planning: Goal: Ability to manage health-related needs will improve Outcome: Progressing   Problem: Self-Care: Goal: Ability to communicate needs accurately will improve Outcome: Progressing   Problem: Activity: Goal: Risk for activity intolerance will decrease Outcome: Progressing   "

## 2024-09-22 NOTE — Progress Notes (Signed)
 "  PROGRESS NOTE    Matthew Roman  FMW:[id number] DOB: [birth year] DOA: 09/02/2024 [facility]CP: [facility]   Brief Narrative: Matthew Roman is a 69 y.o. male with a history of dementia, TIAs, cirrhosis, diabetes mellitus type 2, hyperlipidemia, hepatic encephalopathy, tobacco use, sleep apnea.  Patient presented secondary to confusion and left-sided neglect, found to have evidence of an acute stroke. Neurology consulted. Patient started on dual antiplatelet therapy with aspirin  and Plavix .   Assessment and Plan:  Acute stroke Patient presented with symptoms including confusion. Initial CT head (12/12) significant for no acute intracranial hemorrhage or acute territorial infarction. MRI brain (12/12) confirms a small acute vs early subacute infarction involving the posterior right frontal lobe subcortical white matter. CTA head and neck significant for no large vessel occlusion or hemodynamically significant stenosis. LDL of 58. Hemoglobin A1C of 5.5%. Transthoracic Echocardiogram significant for an LVEF of 60-65% with no atrial level shunt. Neurology recommendations (12/14) for Plavix  indefinitely and for three weeks of aspirin . PT/OT recommendations for acute inpatient rehabilitation. - Continue Aspirin  (21-day course) and Plavix   Acute metabolic encephalopathy Present on admission. Presumed secondary to patient's stroke and complicated by underlying dementia. EEG obtained and suggested generalized cerebral dysfunction without seizures or epileptiform discharges. Mental status improved.  Vascular dementia Cognitive impairment Noted.  Primary hypertension - Continue amlodipine   Diabetes mellitus type 2 Well controlled based on hemoglobin A1C of 5.5%.  Hyperlipidemia - Continue Lipitor  Tobacco abuse Counseled this admission.  Moderate malnutrition - Dietitian recommendations (12/30): Continue heart-healthy diet. Continue Ensure Plus High Protein PO to TID. Each supplement  provides 350 Kcals and 20 grams of protein. Continue Multivitamin PO once daily.   DVT prophylaxis: Subcutaneous heparin  Code Status:   Code Status: Full Code Family Communication: None at bedside Disposition Plan: Discharge to SNF   Consultants:  Neurology  Procedures:  Transthoracic Echocardiogram  Antimicrobials: None    Subjective: No issues from overnight. Taking a walk in the hall.  Objective: BP 135/78 (BP Location: Left Arm)   Pulse 63   Temp 97.9 F (36.6 C)   Resp 20   Ht 6' (1.829 m)   Wt 80.2 kg   SpO2 98%   BMI 23.98 kg/m   Examination:  General exam: Appears calm and comfortable. Respiratory system: Respiratory effort normal. Central nervous system: Alert.    Data Reviewed: I have personally reviewed following labs and imaging studies  CBC Lab Results  Component Value Date   WBC 5.2 09/19/2024   RBC 5.07 09/19/2024   HGB 15.2 09/19/2024   HCT 45.0 09/19/2024   MCV 88.8 09/19/2024   MCH 30.0 09/19/2024   PLT 144 (L) 09/19/2024   MCHC 33.8 09/19/2024   RDW 15.6 (H) 09/19/2024   LYMPHSABS 0.7 09/06/2024   MONOABS 0.4 09/06/2024   EOSABS 0.1 09/06/2024   BASOSABS 0.0 09/06/2024     Last metabolic panel Lab Results  Component Value Date   NA 139 09/19/2024   K 4.7 09/19/2024   CL 103 09/19/2024   CO2 26 09/19/2024   BUN 23 09/19/2024   CREATININE 1.05 09/19/2024   GLUCOSE 122 (H) 09/19/2024   GFRNONAA >60 09/19/2024   GFRAA >60 09/06/2018   CALCIUM  9.8 09/19/2024   PROT 5.9 (L) 09/02/2024   ALBUMIN 3.6 09/02/2024   BILITOT 0.5 09/02/2024   ALKPHOS 96 09/02/2024   AST 24 09/02/2024   ALT 34 09/02/2024   ANIONGAP 10 09/19/2024    GFR: Estimated Creatinine Clearance: 72.9 mL/min (by C-G formula  based on SCr of 1.05 mg/dL).  No results found for this or any previous visit (from the past 240 hours).    Radiology Studies: No results found.    LOS: 15 days    Elgin Lam, MD [facility] 09/22/2024, 12:59  PM   If 7PM-7AM, please contact night-coverage [email or web address]  "

## 2024-09-22 NOTE — Plan of Care (Signed)
" °  Problem: Ischemic Stroke/TIA Tissue Perfusion: Goal: Complications of ischemic stroke/TIA will be minimized Outcome: Progressing   Problem: Coping: Goal: Will verbalize positive feelings about self Outcome: Progressing   Problem: Health Behavior/Discharge Planning: Goal: Ability to manage health-related needs will improve Outcome: Progressing   Problem: Nutrition: Goal: Risk of aspiration will decrease Outcome: Progressing   Problem: Health Behavior/Discharge Planning: Goal: Ability to manage health-related needs will improve Outcome: Progressing   Problem: Metabolic: Goal: Ability to maintain appropriate glucose levels will improve Outcome: Progressing   Problem: Nutritional: Goal: Maintenance of adequate nutrition will improve Outcome: Progressing   Problem: Health Behavior/Discharge Planning: Goal: Ability to manage health-related needs will improve Outcome: Progressing   Problem: Clinical Measurements: Goal: Ability to maintain clinical measurements within normal limits will improve Outcome: Progressing Goal: Will remain free from infection Outcome: Progressing Goal: Diagnostic test results will improve Outcome: Progressing   Problem: Nutrition: Goal: Risk of aspiration will decrease Outcome: Progressing   "

## 2024-09-23 DIAGNOSIS — I639 Cerebral infarction, unspecified: Secondary | ICD-10-CM | POA: Diagnosis not present

## 2024-09-23 NOTE — Plan of Care (Signed)
" °  Problem: Education: Goal: Knowledge of disease or condition will improve Outcome: Progressing Goal: Knowledge of secondary prevention will improve (MUST DOCUMENT ALL) Outcome: Progressing Goal: Knowledge of patient specific risk factors will improve (DELETE if not current risk factor) Outcome: Progressing   Problem: Coping: Goal: Will verbalize positive feelings about self Outcome: Progressing Goal: Will identify appropriate support needs Outcome: Progressing   Problem: Health Behavior/Discharge Planning: Goal: Ability to manage health-related needs will improve Outcome: Progressing Goal: Goals will be collaboratively established with patient/family Outcome: Progressing   Problem: Self-Care: Goal: Ability to participate in self-care as condition permits will improve Outcome: Progressing Goal: Verbalization of feelings and concerns over difficulty with self-care will improve Outcome: Progressing Goal: Ability to communicate needs accurately will improve Outcome: Progressing   Problem: Nutrition: Goal: Dietary intake will improve Outcome: Progressing   Problem: Education: Goal: Ability to describe self-care measures that may prevent or decrease complications (Diabetes Survival Skills Education) will improve Outcome: Progressing Goal: Individualized Educational Video(s) Outcome: Progressing   Problem: Coping: Goal: Ability to adjust to condition or change in health will improve Outcome: Progressing   Problem: Health Behavior/Discharge Planning: Goal: Ability to manage health-related needs will improve Outcome: Progressing   Problem: Nutritional: Goal: Maintenance of adequate nutrition will improve Outcome: Progressing Goal: Progress toward achieving an optimal weight will improve Outcome: Progressing   Problem: Skin Integrity: Goal: Risk for impaired skin integrity will decrease Outcome: Progressing   Problem: Education: Goal: Knowledge of General Education  information will improve Description: Including pain rating scale, medication(s)/side effects and non-pharmacologic comfort measures Outcome: Progressing   Problem: Health Behavior/Discharge Planning: Goal: Ability to manage health-related needs will improve Outcome: Progressing   Problem: Clinical Measurements: Goal: Ability to maintain clinical measurements within normal limits will improve Outcome: Progressing Goal: Will remain free from infection Outcome: Progressing Goal: Diagnostic test results will improve Outcome: Progressing   Problem: Activity: Goal: Risk for activity intolerance will decrease Outcome: Progressing   Problem: Nutrition: Goal: Adequate nutrition will be maintained Outcome: Progressing   Problem: Coping: Goal: Level of anxiety will decrease Outcome: Progressing   Problem: Elimination: Goal: Will not experience complications related to bowel motility Outcome: Progressing Goal: Will not experience complications related to urinary retention Outcome: Progressing   Problem: Education: Goal: Knowledge of disease or condition will improve Outcome: Progressing Goal: Knowledge of secondary prevention will improve (MUST DOCUMENT ALL) Outcome: Progressing Goal: Knowledge of patient specific risk factors will improve (DELETE if not current risk factor) Outcome: Progressing   Problem: Coping: Goal: Will verbalize positive feelings about self Outcome: Progressing Goal: Will identify appropriate support needs Outcome: Progressing   Problem: Health Behavior/Discharge Planning: Goal: Ability to manage health-related needs will improve Outcome: Progressing Goal: Goals will be collaboratively established with patient/family Outcome: Progressing   Problem: Self-Care: Goal: Ability to participate in self-care as condition permits will improve Outcome: Progressing Goal: Verbalization of feelings and concerns over difficulty with self-care will  improve Outcome: Progressing Goal: Ability to communicate needs accurately will improve Outcome: Progressing   Problem: Nutrition: Goal: Dietary intake will improve Outcome: Progressing   "

## 2024-09-23 NOTE — Progress Notes (Signed)
 Occupational Therapy Treatment Patient Details Name: Matthew Roman MRN: [id number] DOB: November 27, 1955 Today's Date: 09/23/2024   History of present illness 69 y/o M presenting to ED On 12/12 with aphasia and L neglect, code stroke activated. MRI with small punctate areas of restricted diffusion in R cerebral hemisphere.    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   OT comments  Pt doing well, has been ambulating up/down hall all day independently. Pt able to complete ADLs independently. Pt with history of caregiver and vascular dementia, likely needs help for daily routines at baseline. Pt not oriented to date, location, or full situation. Pt at this time has no further acute OT needs, likely at baseline. If Pt has daily assist for meals/routines at home, likely will not need full 24/7 support, Pt would benefit from [facility] follow up to maximize safety in home. Per CSW notes looking for possible long-term care placement which may be appropriate if Pt unable to safely complete routines with intermittent supervision at home.       If plan is discharge home, recommend the following:  A little help with walking and/or transfers;Assistance with cooking/housework;Direct supervision/assist for financial management;Direct supervision/assist for medications management;Assist for transportation;Supervision due to cognitive status;Help with stairs or ramp for entrance;A little help with bathing/dressing/bathroom   Equipment Recommendations  Other (comment) (defer)    Recommendations for Other Services      Precautions / Restrictions Precautions Precautions: Fall Recall of Precautions/Restrictions: Impaired Restrictions Weight Bearing Restrictions Per Provider Order: No       Mobility Bed Mobility Overal bed mobility: Modified Independent                  Transfers Overall transfer level: Modified independent                        Balance Overall balance assessment: Mild deficits observed, not formally tested                                         ADL either performed or assessed with clinical judgement   ADL Overall ADL's : Modified independent                                       General ADL Comments: mod I    Extremity/Trunk Assessment Upper Extremity Assessment Upper Extremity Assessment: Overall WFL for tasks assessed            Vision       Perception     Praxis     Communication Communication Communication: No apparent difficulties   Cognition Arousal: Alert Behavior During Therapy: Flat affect Cognition: Cognition impaired   Orientation impairments: Place, Time, Situation         OT - Cognition Comments: Pt able to fully participate, follow commands, overall mod I, but due to history of dementia and stroke likely will need supervision for daily routines. Not oriented to date, location, or situation.                 Following commands: Intact        Cueing   Cueing Techniques: Verbal cues  Exercises      Shoulder Instructions  General Comments      Pertinent Vitals/ Pain       Pain Assessment Pain Assessment: No/denies pain  Home Living                                          Prior Functioning/Environment              Frequency  Min 2X/week        Progress Toward Goals  OT Goals(current goals can now be found in the care plan section)  Progress towards OT goals: Goals met/education completed, patient discharged from OT     Plan      Co-evaluation                 AM-PAC OT 6 Clicks Daily Activity     Outcome Measure   Help from another person eating meals?: None Help from another person taking care of personal grooming?: None Help from another person toileting, which includes using toliet, bedpan, or urinal?: None Help from another person  bathing (including washing, rinsing, drying)?: None Help from another person to put on and taking off regular upper body clothing?: None Help from another person to put on and taking off regular lower body clothing?: None 6 Click Score: 24    End of Session    OT Visit Diagnosis: Unsteadiness on feet (R26.81);Other abnormalities of gait and mobility (R26.89);Muscle weakness (generalized) (M62.81);Other symptoms and signs involving cognitive function   Activity Tolerance Patient tolerated treatment well   Patient Left Other (comment) (ambulating around as needed)   Nurse Communication Mobility status        Time: 8389-8373 OT Time Calculation (min): 16 min  Charges: OT General Charges $OT Visit: 1 Visit OT Treatments $Self Care/Home Management : 8-22 mins  Lorain, OTR/L   [name] 09/23/2024, 4:47 PM

## 2024-09-23 NOTE — TOC Progression Note (Signed)
 Transition of Care [facility]) - Progression Note    Patient Details  Name: Matthew Roman MRN: [id number] Date of Birth: 1956-01-03  Transition of Care [facility]) CM/SW Contact  [name], [place] Phone Number: 09/23/2024, 5:06 PM  Clinical Narrative:     CSW sent referral via fax to [facility] in [place] and [place] for review. CSW will continue to follow and update DC plan.   Expected Discharge Plan: Skilled Nursing Facility Barriers to Discharge: Insurance Authorization               Expected Discharge Plan and Services       Living arrangements for the past 2 months: Single Family Home                                       Social Drivers of Health (SDOH) Interventions SDOH Screenings   Food Insecurity: Patient Unable To Answer (09/03/2024)  Housing: Patient Unable To Answer (09/03/2024)  Transportation Needs: Patient Unable To Answer (09/03/2024)  Utilities: Patient Unable To Answer (09/03/2024)  Social Connections: Patient Unable To Answer (09/03/2024)  Tobacco Use: High Risk (09/03/2024)    Readmission Risk Interventions     No data to display

## 2024-09-23 NOTE — Progress Notes (Signed)
 "  PROGRESS NOTE    Matthew Roman  FMW:[id number] DOB: 08-May-1956 DOA: 09/02/2024 [facility]: [facility]   Brief Narrative: Forrester Blando is a 69 y.o. male with a history of dementia, TIAs, cirrhosis, diabetes mellitus type 2, hyperlipidemia, hepatic encephalopathy, tobacco use, sleep apnea.  Patient presented secondary to confusion and left-sided neglect, found to have evidence of an acute stroke. Neurology consulted. Patient started on dual antiplatelet therapy with aspirin  and Plavix .   Assessment and Plan:  Acute stroke Patient presented with symptoms including confusion. Initial CT head (12/12) significant for no acute intracranial hemorrhage or acute territorial infarction. MRI brain (12/12) confirms a small acute vs early subacute infarction involving the posterior right frontal lobe subcortical white matter. CTA head and neck significant for no large vessel occlusion or hemodynamically significant stenosis. LDL of 58. Hemoglobin A1C of 5.5%. Transthoracic Echocardiogram significant for an LVEF of 60-65% with no atrial level shunt. Neurology recommendations (12/14) for Plavix  indefinitely and for three weeks of aspirin . PT/OT recommendations for acute inpatient rehabilitation. - Continue Aspirin  (21-day course) and Plavix   Acute metabolic encephalopathy Present on admission. Presumed secondary to patient's stroke and complicated by underlying dementia. EEG obtained and suggested generalized cerebral dysfunction without seizures or epileptiform discharges. Mental status improved.  Vascular dementia Cognitive impairment Noted.  Primary hypertension - Continue amlodipine   Diabetes mellitus type 2 Well controlled based on hemoglobin A1C of 5.5%.  Hyperlipidemia - Continue Lipitor  Tobacco abuse Counseled this admission.  Moderate malnutrition - Dietitian recommendations (12/30): Continue heart-healthy diet. Continue Ensure Plus High Protein PO to TID. Each supplement  provides 350 Kcals and 20 grams of protein. Continue Multivitamin PO once daily.   DVT prophylaxis: Subcutaneous heparin  Code Status:   Code Status: Full Code Family Communication: None at bedside Disposition Plan: Discharge to SNF pending bed availability/TOC   Consultants:  Neurology  Procedures:  Transthoracic Echocardiogram  Antimicrobials: None    Subjective: No concerns this morning.  Objective: BP 128/77 (BP Location: Left Arm)   Pulse 64   Temp 98.2 F (36.8 C)   Resp 18   Ht 6' (1.829 m)   Wt 80.2 kg   SpO2 96%   BMI 23.98 kg/m   Examination:  General exam: Appears calm and comfortable. Sitting in his chair in the room. Respiratory system: Respiratory effort normal. Central nervous system: Alert.    Data Reviewed: I have personally reviewed following labs and imaging studies  CBC Lab Results  Component Value Date   WBC 5.2 09/19/2024   RBC 5.07 09/19/2024   HGB 15.2 09/19/2024   HCT 45.0 09/19/2024   MCV 88.8 09/19/2024   MCH 30.0 09/19/2024   PLT 144 (L) 09/19/2024   MCHC 33.8 09/19/2024   RDW 15.6 (H) 09/19/2024   LYMPHSABS 0.7 09/06/2024   MONOABS 0.4 09/06/2024   EOSABS 0.1 09/06/2024   BASOSABS 0.0 09/06/2024     Last metabolic panel Lab Results  Component Value Date   NA 139 09/19/2024   K 4.7 09/19/2024   CL 103 09/19/2024   CO2 26 09/19/2024   BUN 23 09/19/2024   CREATININE 1.05 09/19/2024   GLUCOSE 122 (H) 09/19/2024   GFRNONAA >60 09/19/2024   GFRAA >60 09/06/2018   CALCIUM  9.8 09/19/2024   PROT 5.9 (L) 09/02/2024   ALBUMIN 3.6 09/02/2024   BILITOT 0.5 09/02/2024   ALKPHOS 96 09/02/2024   AST 24 09/02/2024   ALT 34 09/02/2024   ANIONGAP 10 09/19/2024    GFR: Estimated Creatinine Clearance: 72.9  mL/min (by C-G formula based on SCr of 1.05 mg/dL).  No results found for this or any previous visit (from the past 240 hours).    Radiology Studies: No results found.    LOS: 16 days    Elgin Lam, MD [facility] 09/23/2024, 11:08 AM   If 7PM-7AM, please contact night-coverage [email or web address]  "

## 2024-09-24 DIAGNOSIS — I639 Cerebral infarction, unspecified: Secondary | ICD-10-CM | POA: Diagnosis not present

## 2024-09-24 NOTE — Progress Notes (Signed)
 "  PROGRESS NOTE    Matthew Roman  FMW:[id number] DOB: Feb 05, 1956 DOA: 09/02/2024 [facility]CP: [facility]   Brief Narrative: Matthew Roman is a 69 y.o. male with a history of dementia, TIAs, cirrhosis, diabetes mellitus type 2, hyperlipidemia, hepatic encephalopathy, tobacco use, sleep apnea.  Patient presented secondary to confusion and left-sided neglect, found to have evidence of an acute stroke. Neurology consulted. Patient started on dual antiplatelet therapy with aspirin  and Plavix .   Assessment and Plan:  Acute stroke Patient presented with symptoms including confusion. Initial CT head (12/12) significant for no acute intracranial hemorrhage or acute territorial infarction. MRI brain (12/12) confirms a small acute vs early subacute infarction involving the posterior right frontal lobe subcortical white matter. CTA head and neck significant for no large vessel occlusion or hemodynamically significant stenosis. LDL of 58. Hemoglobin A1C of 5.5%. Transthoracic Echocardiogram significant for an LVEF of 60-65% with no atrial level shunt. Neurology recommendations (12/14) for Plavix  indefinitely and for three weeks of aspirin . PT/OT recommendations for acute inpatient rehabilitation. - Continue Aspirin  (21-day course) and Plavix   Acute metabolic encephalopathy Present on admission. Presumed secondary to patient's stroke and complicated by underlying dementia. EEG obtained and suggested generalized cerebral dysfunction without seizures or epileptiform discharges. Mental status improved.  Vascular dementia Cognitive impairment Noted.  Primary hypertension - Continue amlodipine   Diabetes mellitus type 2 Well controlled based on hemoglobin A1C of 5.5%.  Hyperlipidemia - Continue Lipitor  Tobacco abuse Counseled this admission.  Moderate malnutrition - Dietitian recommendations (12/30): Continue heart-healthy diet. Continue Ensure Plus High Protein PO to TID. Each supplement  provides 350 Kcals and 20 grams of protein. Continue Multivitamin PO once daily.   DVT prophylaxis: Subcutaneous heparin  Code Status:   Code Status: Full Code Family Communication: None at bedside Disposition Plan: Discharge to SNF pending bed availability/TOC   Consultants:  Neurology  Procedures:  Transthoracic Echocardiogram  Antimicrobials: None    Subjective: No concerns.  Objective: BP 132/80 (BP Location: Left Arm)   Pulse 63   Temp 97.8 F (36.6 C)   Resp 14   Ht 6' (1.829 m)   Wt 80.2 kg   SpO2 97%   BMI 23.98 kg/m   Examination:  General exam: Appears calm and comfortable. About to take a stroll around the unit. Respiratory system: Respiratory effort normal. Central nervous system: Alert.    Data Reviewed: I have personally reviewed following labs and imaging studies  CBC Lab Results  Component Value Date   WBC 5.2 09/19/2024   RBC 5.07 09/19/2024   HGB 15.2 09/19/2024   HCT 45.0 09/19/2024   MCV 88.8 09/19/2024   MCH 30.0 09/19/2024   PLT 144 (L) 09/19/2024   MCHC 33.8 09/19/2024   RDW 15.6 (H) 09/19/2024   LYMPHSABS 0.7 09/06/2024   MONOABS 0.4 09/06/2024   EOSABS 0.1 09/06/2024   BASOSABS 0.0 09/06/2024     Last metabolic panel Lab Results  Component Value Date   NA 139 09/19/2024   K 4.7 09/19/2024   CL 103 09/19/2024   CO2 26 09/19/2024   BUN 23 09/19/2024   CREATININE 1.05 09/19/2024   GLUCOSE 122 (H) 09/19/2024   GFRNONAA >60 09/19/2024   GFRAA >60 09/06/2018   CALCIUM  9.8 09/19/2024   PROT 5.9 (L) 09/02/2024   ALBUMIN 3.6 09/02/2024   BILITOT 0.5 09/02/2024   ALKPHOS 96 09/02/2024   AST 24 09/02/2024   ALT 34 09/02/2024   ANIONGAP 10 09/19/2024    GFR: Estimated Creatinine Clearance: 72.9 mL/min (  by C-G formula based on SCr of 1.05 mg/dL).  No results found for this or any previous visit (from the past 240 hours).    Radiology Studies: No results found.    LOS: 17 days    Elgin Lam, MD [facility] 09/24/2024, 2:54 PM   If 7PM-7AM, please contact night-coverage [email or web address]  "

## 2024-09-24 NOTE — Progress Notes (Signed)
 Physical Therapy Treatment Patient Details Name: Matthew Roman MRN: [id number] DOB: 1956-06-30 Today's Date: 09/24/2024   History of Present Illness 69 y/o M presenting to ED On 12/12 with aphasia and L neglect, code stroke activated. MRI with small punctate areas of restricted diffusion in R cerebral hemisphere.    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    PT Comments  Good progress towards functional goals, tolerated dynamic gait challenges without overt LOB at a supervision level. DGI with >7 point increase indicating clinically significant improvement in balance and function. Will need to reassess BERG in the future. Pt tolerated navigating stairs with supervision and minor cues for safety/technique. His safety/function seems to be primarily limited by cognition. Likely to meet acute PT goals soon. Patient will continue to benefit from skilled physical therapy services to further improve independence with functional mobility.    If plan is discharge home, recommend the following: A little help with bathing/dressing/bathroom;Assistance with cooking/housework;Direct supervision/assist for medications management;Direct supervision/assist for financial management;Assist for transportation;Help with stairs or ramp for entrance;Supervision due to cognitive status   Can travel by private vehicle        Equipment Recommendations  None recommended by PT    Recommendations for Other Services       Precautions / Restrictions Precautions Precautions: Fall Recall of Precautions/Restrictions: Impaired Restrictions Weight Bearing Restrictions Per Provider Order: No     Mobility  Bed Mobility               General bed mobility comments: patient in recliner upon entry    Transfers Overall transfer level: Modified independent Equipment used: None               General transfer comment: stands safely  without assist.    Ambulation/Gait Ambulation/Gait assistance: Supervision Gait Distance (Feet): 475 Feet Assistive device: None Gait Pattern/deviations: Step-through pattern, Shuffle, Decreased stride length Gait velocity: decreased Gait velocity interpretation: <1.31 ft/sec, indicative of household ambulator   General Gait Details: Frequent cues to increase step length, be mindful of foot clearance and reduce shuffle. He is able to do this consistently with effort but gradually declines back to shuffled steps, only correcting himself occasionally. Tolerated dynamic challenges today without LOB or buckling. Quick turns, backwards steps, variable speeds. No physical assistance needed, supervision for safety, and cues to find his way back to room.   Stairs Stairs: Yes Stairs assistance: Supervision Stair Management: One rail Right, Step to pattern, Alternating pattern, Forwards Number of Stairs: 13 General stair comments: Single rail on Lt, step-to pattern progressing to alternating pattern. Rt foot not completely on step when ascending, needs cues to correct for safety. Supervision for safety, no overt LOB with use of rail for support.   Wheelchair Mobility     Tilt Bed    Modified Rankin (Stroke Patients Only)       Balance Overall balance assessment: Mild deficits observed, not formally tested                               Standardized Balance Assessment Standardized Balance Assessment : Dynamic Gait Index   Dynamic Gait Index Level Surface: Mild Impairment Change in Gait Speed: Mild Impairment Gait with Horizontal Head Turns: Mild Impairment Gait with Vertical Head Turns: Mild Impairment Gait and Pivot Turn: Normal Step Over Obstacle: Mild Impairment Step Around Obstacles: Normal Steps: Mild Impairment Total  Score: 18      Communication Communication Communication: No apparent difficulties Factors Affecting Communication: Difficulty expressing  self  Cognition Arousal: Alert Behavior During Therapy: Flat affect   PT - Cognitive impairments: No family/caregiver present to determine baseline, Orientation, Awareness, Memory, Attention, Initiation, Sequencing, Problem solving, Safety/Judgement                       PT - Cognition Comments: following multi step directions today. Still unaware of situation. Following commands: Intact      Cueing Cueing Techniques: Verbal cues  Exercises      General Comments        Pertinent Vitals/Pain Pain Assessment Pain Assessment: No/denies pain    Home Living                          Prior Function            PT Goals (current goals can now be found in the care plan section) Acute Rehab PT Goals Patient Stated Goal: Go home PT Goal Formulation: Patient unable to participate in goal setting Time For Goal Achievement: 10/03/24 Potential to Achieve Goals: Good Additional Goals Additional Goal #1: DGI goal met, Need to assess BERG next Progress towards PT goals: Progressing toward goals    Frequency    Min 2X/week      PT Plan      Co-evaluation              AM-PAC PT 6 Clicks Mobility   Outcome Measure  Help needed turning from your back to your side while in a flat bed without using bedrails?: None Help needed moving from lying on your back to sitting on the side of a flat bed without using bedrails?: None Help needed moving to and from a bed to a chair (including a wheelchair)?: None Help needed standing up from a chair using your arms (e.g., wheelchair or bedside chair)?: None Help needed to walk in hospital room?: A Little Help needed climbing 3-5 steps with a railing? : A Little 6 Click Score: 22    End of Session Equipment Utilized During Treatment: Gait belt Activity Tolerance: Patient tolerated treatment well Patient left: in chair;with call bell/phone within reach   PT Visit Diagnosis: Unsteadiness on feet (R26.81);Other  abnormalities of gait and mobility (R26.89);Difficulty in walking, not elsewhere classified (R26.2);Other symptoms and signs involving the nervous system (R29.898)     Time: 8458-8445 PT Time Calculation (min) (ACUTE ONLY): 13 min  Charges:    $Gait Training: 8-22 mins PT General Charges $$ ACUTE PT VISIT: 1 Visit                     Leontine Roads, PT, DPT [facility]  Rehabilitation Services Physical Therapist Office: [phone] Website: [email or web address]    Leontine GORMAN Roads 09/24/2024, 4:14 PM

## 2024-09-24 NOTE — Plan of Care (Signed)
 " Problem: Education: Goal: Knowledge of disease or condition will improve 09/24/2024 0041 by Bari Alfonso SAILOR, LPN Outcome: Progressing 09/24/2024 0040 by Bari Alfonso SAILOR, LPN Outcome: Progressing Goal: Knowledge of secondary prevention will improve (MUST DOCUMENT ALL) 09/24/2024 0041 by Bari Alfonso SAILOR, LPN Outcome: Progressing 09/24/2024 0040 by Bari Alfonso SAILOR, LPN Outcome: Progressing Goal: Knowledge of patient specific risk factors will improve (DELETE if not current risk factor) 09/24/2024 0041 by Bari Alfonso SAILOR, LPN Outcome: Progressing 09/24/2024 0040 by Bari Alfonso SAILOR, LPN Outcome: Progressing   Problem: Coping: Goal: Will verbalize positive feelings about self 09/24/2024 0041 by Bari Alfonso SAILOR, LPN Outcome: Progressing 09/24/2024 0040 by Bari Alfonso SAILOR, LPN Outcome: Progressing Goal: Will identify appropriate support needs 09/24/2024 0041 by Bari Alfonso SAILOR, LPN Outcome: Progressing 09/24/2024 0040 by Bari Alfonso SAILOR, LPN Outcome: Progressing   Problem: Health Behavior/Discharge Planning: Goal: Ability to manage health-related needs will improve 09/24/2024 0041 by Bari Alfonso SAILOR, LPN Outcome: Progressing 09/24/2024 0040 by Bari Alfonso SAILOR, LPN Outcome: Progressing Goal: Goals will be collaboratively established with patient/family 09/24/2024 0041 by Bari Alfonso SAILOR, LPN Outcome: Progressing 09/24/2024 0040 by Bari Alfonso SAILOR, LPN Outcome: Progressing   Problem: Self-Care: Goal: Ability to participate in self-care as condition permits will improve 09/24/2024 0041 by Bari Alfonso SAILOR, LPN Outcome: Progressing 09/24/2024 0040 by Bari Alfonso SAILOR, LPN Outcome: Progressing Goal: Verbalization of feelings and concerns over difficulty with self-care will improve 09/24/2024 0041 by Bari Alfonso SAILOR, LPN Outcome: Progressing 09/24/2024 0040 by Bari Alfonso SAILOR, LPN Outcome: Progressing Goal: Ability to communicate needs accurately will improve 09/24/2024 0041 by Bari Alfonso SAILOR,  LPN Outcome: Progressing 09/24/2024 0040 by Bari Alfonso SAILOR, LPN Outcome: Progressing   Problem: Nutrition: Goal: Dietary intake will improve 09/24/2024 0041 by Bari Alfonso SAILOR, LPN Outcome: Progressing 09/24/2024 0040 by Bari Alfonso SAILOR, LPN Outcome: Progressing   Problem: Education: Goal: Ability to describe self-care measures that may prevent or decrease complications (Diabetes Survival Skills Education) will improve 09/24/2024 0041 by Bari Alfonso SAILOR, LPN Outcome: Progressing 09/24/2024 0040 by Bari Alfonso SAILOR, LPN Outcome: Progressing Goal: Individualized Educational Video(s) 09/24/2024 0041 by Bari Alfonso SAILOR, LPN Outcome: Progressing 09/24/2024 0040 by Bari Alfonso SAILOR, LPN Outcome: Progressing   Problem: Coping: Goal: Ability to adjust to condition or change in health will improve 09/24/2024 0041 by Bari Alfonso SAILOR, LPN Outcome: Progressing 09/24/2024 0040 by Bari Alfonso SAILOR, LPN Outcome: Progressing   Problem: Health Behavior/Discharge Planning: Goal: Ability to manage health-related needs will improve 09/24/2024 0041 by Bari Alfonso SAILOR, LPN Outcome: Progressing 09/24/2024 0040 by Bari Alfonso SAILOR, LPN Outcome: Progressing   Problem: Nutritional: Goal: Maintenance of adequate nutrition will improve 09/24/2024 0041 by Bari Alfonso SAILOR, LPN Outcome: Progressing 09/24/2024 0040 by Bari Alfonso SAILOR, LPN Outcome: Progressing Goal: Progress toward achieving an optimal weight will improve 09/24/2024 0041 by Bari Alfonso SAILOR, LPN Outcome: Progressing 09/24/2024 0040 by Bari Alfonso SAILOR, LPN Outcome: Progressing   Problem: Skin Integrity: Goal: Risk for impaired skin integrity will decrease 09/24/2024 0041 by Bari Alfonso SAILOR, LPN Outcome: Progressing 09/24/2024 0040 by Bari Alfonso SAILOR, LPN Outcome: Progressing   Problem: Education: Goal: Knowledge of General Education information will improve Description: Including pain rating scale, medication(s)/side effects and  non-pharmacologic comfort measures 09/24/2024 0041 by Bari Alfonso SAILOR, LPN Outcome: Progressing 09/24/2024 0040 by Bari Alfonso SAILOR, LPN Outcome: Progressing   Problem: Health Behavior/Discharge Planning: Goal: Ability to manage health-related needs will improve 09/24/2024 0041 by Bari Alfonso SAILOR, LPN Outcome: Progressing 09/24/2024 0040  by Bari Alfonso SAILOR, LPN Outcome: Progressing   Problem: Clinical Measurements: Goal: Ability to maintain clinical measurements within normal limits will improve 09/24/2024 0041 by Bari Alfonso SAILOR, LPN Outcome: Progressing 09/24/2024 0040 by Bari Alfonso SAILOR, LPN Outcome: Progressing Goal: Will remain free from infection 09/24/2024 0041 by Bari Alfonso SAILOR, LPN Outcome: Progressing 09/24/2024 0040 by Bari Alfonso SAILOR, LPN Outcome: Progressing Goal: Diagnostic test results will improve 09/24/2024 0041 by Bari Alfonso SAILOR, LPN Outcome: Progressing 09/24/2024 0040 by Bari Alfonso SAILOR, LPN Outcome: Progressing   Problem: Activity: Goal: Risk for activity intolerance will decrease 09/24/2024 0041 by Bari Alfonso SAILOR, LPN Outcome: Progressing 09/24/2024 0040 by Bari Alfonso SAILOR, LPN Outcome: Progressing   Problem: Nutrition: Goal: Adequate nutrition will be maintained 09/24/2024 0041 by Bari Alfonso SAILOR, LPN Outcome: Progressing 09/24/2024 0040 by Bari Alfonso SAILOR, LPN Outcome: Progressing   Problem: Coping: Goal: Level of anxiety will decrease 09/24/2024 0041 by Bari Alfonso SAILOR, LPN Outcome: Progressing 09/24/2024 0040 by Bari Alfonso SAILOR, LPN Outcome: Progressing   Problem: Elimination: Goal: Will not experience complications related to bowel motility 09/24/2024 0041 by Bari Alfonso SAILOR, LPN Outcome: Progressing 09/24/2024 0040 by Bari Alfonso SAILOR, LPN Outcome: Progressing Goal: Will not experience complications related to urinary retention 09/24/2024 0041 by Bari Alfonso SAILOR, LPN Outcome: Progressing 09/24/2024 0040 by Bari Alfonso SAILOR, LPN Outcome:  Progressing   Problem: Pain Managment: Goal: General experience of comfort will improve and/or be controlled 09/24/2024 0041 by Bari Alfonso SAILOR, LPN Outcome: Progressing 09/24/2024 0040 by Bari Alfonso SAILOR, LPN Outcome: Progressing   Problem: Safety: Goal: Ability to remain free from injury will improve 09/24/2024 0041 by Bari Alfonso SAILOR, LPN Outcome: Progressing 09/24/2024 0040 by Bari Alfonso SAILOR, LPN Outcome: Progressing   Problem: Skin Integrity: Goal: Risk for impaired skin integrity will decrease 09/24/2024 0041 by Bari Alfonso SAILOR, LPN Outcome: Progressing 09/24/2024 0040 by Bari Alfonso SAILOR, LPN Outcome: Progressing   Problem: Education: Goal: Knowledge of disease or condition will improve 09/24/2024 0041 by Bari Alfonso SAILOR, LPN Outcome: Progressing 09/24/2024 0040 by Bari Alfonso SAILOR, LPN Outcome: Progressing Goal: Knowledge of secondary prevention will improve (MUST DOCUMENT ALL) 09/24/2024 0041 by Bari Alfonso SAILOR, LPN Outcome: Progressing 09/24/2024 0040 by Bari Alfonso SAILOR, LPN Outcome: Progressing Goal: Knowledge of patient specific risk factors will improve (DELETE if not current risk factor) 09/24/2024 0041 by Bari Alfonso SAILOR, LPN Outcome: Progressing 09/24/2024 0040 by Bari Alfonso SAILOR, LPN Outcome: Progressing   Problem: Coping: Goal: Will verbalize positive feelings about self 09/24/2024 0041 by Bari Alfonso SAILOR, LPN Outcome: Progressing 09/24/2024 0040 by Bari Alfonso SAILOR, LPN Outcome: Progressing Goal: Will identify appropriate support needs 09/24/2024 0041 by Bari Alfonso SAILOR, LPN Outcome: Progressing 09/24/2024 0040 by Bari Alfonso SAILOR, LPN Outcome: Progressing   Problem: Health Behavior/Discharge Planning: Goal: Ability to manage health-related needs will improve 09/24/2024 0041 by Bari Alfonso SAILOR, LPN Outcome: Progressing 09/24/2024 0040 by Bari Alfonso SAILOR, LPN Outcome: Progressing Goal: Goals will be collaboratively established with patient/family 09/24/2024 0041  by Bari Alfonso SAILOR, LPN Outcome: Progressing 09/24/2024 0040 by Bari Alfonso SAILOR, LPN Outcome: Progressing   Problem: Self-Care: Goal: Ability to participate in self-care as condition permits will improve 09/24/2024 0041 by Bari Alfonso SAILOR, LPN Outcome: Progressing 09/24/2024 0040 by Bari Alfonso SAILOR, LPN Outcome: Progressing Goal: Verbalization of feelings and concerns over difficulty with self-care will improve 09/24/2024 0041 by Bari Alfonso SAILOR, LPN Outcome: Progressing 09/24/2024 0040 by Bari Alfonso SAILOR, LPN Outcome: Progressing Goal: Ability to communicate needs accurately  will improve 09/24/2024 0041 by Bari Alfonso SAILOR, LPN Outcome: Progressing 09/24/2024 0040 by Bari Alfonso SAILOR, LPN Outcome: Progressing   Problem: Nutrition: Goal: Dietary intake will improve 09/24/2024 0041 by Bari Alfonso SAILOR, LPN Outcome: Progressing 09/24/2024 0040 by Bari Alfonso SAILOR, LPN Outcome: Progressing   "

## 2024-09-25 DIAGNOSIS — I639 Cerebral infarction, unspecified: Secondary | ICD-10-CM | POA: Diagnosis not present

## 2024-09-25 NOTE — Plan of Care (Signed)
" °  Problem: Education: Goal: Knowledge of disease or condition will improve Outcome: Progressing Goal: Knowledge of secondary prevention will improve (MUST DOCUMENT ALL) Outcome: Progressing Goal: Knowledge of patient specific risk factors will improve (DELETE if not current risk factor) Outcome: Progressing   Problem: Coping: Goal: Will verbalize positive feelings about self Outcome: Progressing Goal: Will identify appropriate support needs Outcome: Progressing   Problem: Health Behavior/Discharge Planning: Goal: Ability to manage health-related needs will improve Outcome: Progressing Goal: Goals will be collaboratively established with patient/family Outcome: Progressing   Problem: Self-Care: Goal: Ability to participate in self-care as condition permits will improve Outcome: Progressing Goal: Verbalization of feelings and concerns over difficulty with self-care will improve Outcome: Progressing Goal: Ability to communicate needs accurately will improve Outcome: Progressing   Problem: Nutrition: Goal: Dietary intake will improve Outcome: Progressing   Problem: Education: Goal: Ability to describe self-care measures that may prevent or decrease complications (Diabetes Survival Skills Education) will improve Outcome: Progressing Goal: Individualized Educational Video(s) Outcome: Progressing   Problem: Coping: Goal: Ability to adjust to condition or change in health will improve Outcome: Progressing   Problem: Health Behavior/Discharge Planning: Goal: Ability to manage health-related needs will improve Outcome: Progressing   Problem: Nutritional: Goal: Maintenance of adequate nutrition will improve Outcome: Progressing Goal: Progress toward achieving an optimal weight will improve Outcome: Progressing   Problem: Skin Integrity: Goal: Risk for impaired skin integrity will decrease Outcome: Progressing   Problem: Education: Goal: Knowledge of General Education  information will improve Description: Including pain rating scale, medication(s)/side effects and non-pharmacologic comfort measures Outcome: Progressing   Problem: Health Behavior/Discharge Planning: Goal: Ability to manage health-related needs will improve Outcome: Progressing   Problem: Clinical Measurements: Goal: Ability to maintain clinical measurements within normal limits will improve Outcome: Progressing Goal: Will remain free from infection Outcome: Progressing Goal: Diagnostic test results will improve Outcome: Progressing   Problem: Activity: Goal: Risk for activity intolerance will decrease Outcome: Progressing   Problem: Nutrition: Goal: Adequate nutrition will be maintained Outcome: Progressing   Problem: Coping: Goal: Level of anxiety will decrease Outcome: Progressing   Problem: Elimination: Goal: Will not experience complications related to bowel motility Outcome: Progressing Goal: Will not experience complications related to urinary retention Outcome: Progressing   Problem: Pain Managment: Goal: General experience of comfort will improve and/or be controlled Outcome: Progressing   Problem: Safety: Goal: Ability to remain free from injury will improve Outcome: Progressing   Problem: Skin Integrity: Goal: Risk for impaired skin integrity will decrease Outcome: Progressing   Problem: Education: Goal: Knowledge of disease or condition will improve Outcome: Progressing Goal: Knowledge of secondary prevention will improve (MUST DOCUMENT ALL) Outcome: Progressing Goal: Knowledge of patient specific risk factors will improve (DELETE if not current risk factor) Outcome: Progressing   Problem: Coping: Goal: Will verbalize positive feelings about self Outcome: Progressing Goal: Will identify appropriate support needs Outcome: Progressing   Problem: Health Behavior/Discharge Planning: Goal: Ability to manage health-related needs will  improve Outcome: Progressing Goal: Goals will be collaboratively established with patient/family Outcome: Progressing   Problem: Self-Care: Goal: Ability to participate in self-care as condition permits will improve Outcome: Progressing Goal: Verbalization of feelings and concerns over difficulty with self-care will improve Outcome: Progressing Goal: Ability to communicate needs accurately will improve Outcome: Progressing   Problem: Nutrition: Goal: Dietary intake will improve Outcome: Progressing   "

## 2024-09-25 NOTE — Plan of Care (Signed)
" °  Problem: Education: Goal: Knowledge of disease or condition will improve Outcome: Progressing Goal: Knowledge of secondary prevention will improve (MUST DOCUMENT ALL) Outcome: Progressing   Problem: Coping: Goal: Will verbalize positive feelings about self Outcome: Progressing Goal: Will identify appropriate support needs Outcome: Progressing   Problem: Health Behavior/Discharge Planning: Goal: Ability to manage health-related needs will improve Outcome: Progressing Goal: Goals will be collaboratively established with patient/family Outcome: Progressing   Problem: Self-Care: Goal: Ability to participate in self-care as condition permits will improve Outcome: Progressing Goal: Verbalization of feelings and concerns over difficulty with self-care will improve Outcome: Progressing Goal: Ability to communicate needs accurately will improve Outcome: Progressing   Problem: Nutrition: Goal: Dietary intake will improve Outcome: Progressing   Problem: Education: Goal: Ability to describe self-care measures that may prevent or decrease complications (Diabetes Survival Skills Education) will improve Outcome: Progressing Goal: Individualized Educational Video(s) Outcome: Progressing   Problem: Coping: Goal: Ability to adjust to condition or change in health will improve Outcome: Progressing   Problem: Health Behavior/Discharge Planning: Goal: Ability to manage health-related needs will improve Outcome: Progressing   Problem: Nutritional: Goal: Maintenance of adequate nutrition will improve Outcome: Progressing Goal: Progress toward achieving an optimal weight will improve Outcome: Progressing   Problem: Skin Integrity: Goal: Risk for impaired skin integrity will decrease Outcome: Progressing   Problem: Education: Goal: Knowledge of General Education information will improve Description: Including pain rating scale, medication(s)/side effects and non-pharmacologic comfort  measures Outcome: Progressing   Problem: Health Behavior/Discharge Planning: Goal: Ability to manage health-related needs will improve Outcome: Progressing   Problem: Clinical Measurements: Goal: Ability to maintain clinical measurements within normal limits will improve Outcome: Progressing Goal: Will remain free from infection Outcome: Progressing Goal: Diagnostic test results will improve Outcome: Progressing   Problem: Activity: Goal: Risk for activity intolerance will decrease Outcome: Progressing   Problem: Nutrition: Goal: Adequate nutrition will be maintained Outcome: Progressing   Problem: Coping: Goal: Level of anxiety will decrease Outcome: Progressing   Problem: Elimination: Goal: Will not experience complications related to urinary retention Outcome: Progressing   Problem: Safety: Goal: Ability to remain free from injury will improve Outcome: Progressing   Problem: Skin Integrity: Goal: Risk for impaired skin integrity will decrease Outcome: Progressing   Problem: Education: Goal: Knowledge of disease or condition will improve Outcome: Progressing Goal: Knowledge of secondary prevention will improve (MUST DOCUMENT ALL) Outcome: Progressing Goal: Knowledge of patient specific risk factors will improve (DELETE if not current risk factor) Outcome: Progressing   Problem: Coping: Goal: Will verbalize positive feelings about self Outcome: Progressing Goal: Will identify appropriate support needs Outcome: Progressing   Problem: Health Behavior/Discharge Planning: Goal: Ability to manage health-related needs will improve Outcome: Progressing Goal: Goals will be collaboratively established with patient/family Outcome: Progressing   Problem: Self-Care: Goal: Ability to participate in self-care as condition permits will improve Outcome: Progressing Goal: Verbalization of feelings and concerns over difficulty with self-care will improve Outcome:  Progressing Goal: Ability to communicate needs accurately will improve Outcome: Progressing   Problem: Nutrition: Goal: Dietary intake will improve Outcome: Progressing   "

## 2024-09-25 NOTE — Progress Notes (Signed)
 "  PROGRESS NOTE    Matthew Roman  FMW:[id number] DOB: [birth year] DOA: 09/02/2024 PCP: [facility]   Brief Narrative: Matthew Roman is a 69 y.o. male with a history of dementia, TIAs, cirrhosis, diabetes mellitus type 2, hyperlipidemia, hepatic encephalopathy, tobacco use, sleep apnea.  Patient presented secondary to confusion and left-sided neglect, found to have evidence of an acute stroke. Neurology consulted. Patient started on dual antiplatelet therapy with aspirin  and Plavix .   Assessment and Plan:  Acute stroke Patient presented with symptoms including confusion. Initial CT head (12/12) significant for no acute intracranial hemorrhage or acute territorial infarction. MRI brain (12/12) confirms a small acute vs early subacute infarction involving the posterior right frontal lobe subcortical white matter. CTA head and neck significant for no large vessel occlusion or hemodynamically significant stenosis. LDL of 58. Hemoglobin A1C of 5.5%. Transthoracic Echocardiogram significant for an LVEF of 60-65% with no atrial level shunt. Neurology recommendations (12/14) for Plavix  indefinitely and for three weeks of aspirin  (completed course). PT/OT recommendations for acute inpatient rehabilitation. - Continue Plavix   Acute metabolic encephalopathy Present on admission. Presumed secondary to patient's stroke and complicated by underlying dementia. EEG obtained and suggested generalized cerebral dysfunction without seizures or epileptiform discharges. Mental status improved.  Vascular dementia Cognitive impairment Noted.  Primary hypertension - Continue amlodipine   Diabetes mellitus type 2 Well controlled based on hemoglobin A1C of 5.5%.  Hyperlipidemia - Continue Lipitor  Tobacco abuse Counseled this admission.  Moderate malnutrition - Dietitian recommendations (12/30): Continue heart-healthy diet. Continue Ensure Plus High Protein PO to TID. Each supplement provides  350 Kcals and 20 grams of protein. Continue Multivitamin PO once daily.   DVT prophylaxis: Subcutaneous heparin  Code Status:   Code Status: Full Code Family Communication: None at bedside Disposition Plan: Discharge to SNF pending bed availability/TOC   Consultants:  Neurology  Procedures:  Transthoracic Echocardiogram  Antimicrobials: None    Subjective: No issues this morning, per patient.  Objective: BP (!) 144/69 (BP Location: Left Arm)   Pulse 64   Temp 98 F (36.7 C) (Oral)   Resp 20   Ht 6' (1.829 m)   Wt 80.2 kg   SpO2 97%   BMI 23.98 kg/m   Examination:  General exam: Appears calm and comfortable. Respiratory system: Respiratory effort normal. Central nervous system: Alert.    Data Reviewed: I have personally reviewed following labs and imaging studies  CBC Lab Results  Component Value Date   WBC 5.2 09/19/2024   RBC 5.07 09/19/2024   HGB 15.2 09/19/2024   HCT 45.0 09/19/2024   MCV 88.8 09/19/2024   MCH 30.0 09/19/2024   PLT 144 (L) 09/19/2024   MCHC 33.8 09/19/2024   RDW 15.6 (H) 09/19/2024   LYMPHSABS 0.7 09/06/2024   MONOABS 0.4 09/06/2024   EOSABS 0.1 09/06/2024   BASOSABS 0.0 09/06/2024     Last metabolic panel Lab Results  Component Value Date   NA 139 09/19/2024   K 4.7 09/19/2024   CL 103 09/19/2024   CO2 26 09/19/2024   BUN 23 09/19/2024   CREATININE 1.05 09/19/2024   GLUCOSE 122 (H) 09/19/2024   GFRNONAA >60 09/19/2024   GFRAA >60 09/06/2018   CALCIUM  9.8 09/19/2024   PROT 5.9 (L) 09/02/2024   ALBUMIN 3.6 09/02/2024   BILITOT 0.5 09/02/2024   ALKPHOS 96 09/02/2024   AST 24 09/02/2024   ALT 34 09/02/2024   ANIONGAP 10 09/19/2024    GFR: Estimated Creatinine Clearance: 72.9 mL/min (by C-G formula based  on SCr of 1.05 mg/dL).  No results found for this or any previous visit (from the past 240 hours).    Radiology Studies: No results found.    LOS: 18 days    Elgin Lam, MD [facility] 09/25/2024, 11:24 AM   If 7PM-7AM, please contact night-coverage [email or web address]  "

## 2024-09-26 NOTE — Plan of Care (Signed)
   Problem: Health Behavior/Discharge Planning: Goal: Ability to manage health-related needs will improve Outcome: Progressing

## 2024-09-26 NOTE — Plan of Care (Signed)
  Problem: Education: Goal: Knowledge of disease or condition will improve Outcome: Adequate for Discharge Goal: Knowledge of secondary prevention will improve (MUST DOCUMENT ALL) Outcome: Adequate for Discharge Goal: Knowledge of patient specific risk factors will improve (DELETE if not current risk factor) Outcome: Adequate for Discharge

## 2024-09-26 NOTE — Progress Notes (Signed)
 "  PROGRESS NOTE    Antwine Agosto  FMW:[id number] DOB: Jan 24, 1956 DOA: 09/02/2024 PCP: [facility]   Brief Narrative: Matthew Roman is a 69 y.o. male with a history of dementia, TIAs, cirrhosis, diabetes mellitus type 2, hyperlipidemia, hepatic encephalopathy, tobacco use, sleep apnea.  Patient presented secondary to confusion and left-sided neglect, found to have evidence of an acute stroke. Neurology consulted. Patient started on dual antiplatelet therapy with aspirin  and Plavix .   Assessment and Plan:  Acute stroke Patient presented with symptoms including confusion. Initial CT head (12/12) significant for no acute intracranial hemorrhage or acute territorial infarction. MRI brain (12/12) confirms a small acute vs early subacute infarction involving the posterior right frontal lobe subcortical white matter. CTA head and neck significant for no large vessel occlusion or hemodynamically significant stenosis. LDL of 58. Hemoglobin A1C of 5.5%. Transthoracic Echocardiogram significant for an LVEF of 60-65% with no atrial level shunt. Neurology recommendations (12/14) for Plavix  indefinitely and for three weeks of aspirin  (completed course). PT/OT recommendations for acute inpatient rehabilitation. - Continue Plavix   Acute metabolic encephalopathy Present on admission. Presumed secondary to patient's stroke and complicated by underlying dementia. EEG obtained and suggested generalized cerebral dysfunction without seizures or epileptiform discharges. Mental status improved.  Vascular dementia Cognitive impairment Noted.  Primary hypertension - Continue amlodipine   Diabetes mellitus type 2 Well controlled based on hemoglobin A1C of 5.5%.  Hyperlipidemia - Continue Lipitor  Tobacco abuse Counseled this admission.  Moderate malnutrition - Dietitian recommendations (12/30): Continue heart-healthy diet. Continue Ensure Plus High Protein PO to TID. Each supplement provides  350 Kcals and 20 grams of protein. Continue Multivitamin PO once daily.   DVT prophylaxis: Subcutaneous heparin  Code Status:   Code Status: Full Code Family Communication: None at bedside Disposition Plan: Discharge to SNF pending bed availability/TOC   Consultants:  Neurology  Procedures:  Transthoracic Echocardiogram  Antimicrobials: None    Subjective: No problems this morning.   Objective: BP 129/72 (BP Location: Left Arm)   Pulse 66   Temp 98 F (36.7 C) (Oral)   Resp 18   Ht 6' (1.829 m)   Wt 80.2 kg   SpO2 97%   BMI 23.98 kg/m   Examination:  General exam: Appears calm and comfortable. Watching TV. Respiratory system: Respiratory effort normal. Central nervous system: Alert.    Data Reviewed: I have personally reviewed following labs and imaging studies  CBC Lab Results  Component Value Date   WBC 5.2 09/19/2024   RBC 5.07 09/19/2024   HGB 15.2 09/19/2024   HCT 45.0 09/19/2024   MCV 88.8 09/19/2024   MCH 30.0 09/19/2024   PLT 144 (L) 09/19/2024   MCHC 33.8 09/19/2024   RDW 15.6 (H) 09/19/2024   LYMPHSABS 0.7 09/06/2024   MONOABS 0.4 09/06/2024   EOSABS 0.1 09/06/2024   BASOSABS 0.0 09/06/2024     Last metabolic panel Lab Results  Component Value Date   NA 139 09/19/2024   K 4.7 09/19/2024   CL 103 09/19/2024   CO2 26 09/19/2024   BUN 23 09/19/2024   CREATININE 1.05 09/19/2024   GLUCOSE 122 (H) 09/19/2024   GFRNONAA >60 09/19/2024   GFRAA >60 09/06/2018   CALCIUM  9.8 09/19/2024   PROT 5.9 (L) 09/02/2024   ALBUMIN 3.6 09/02/2024   BILITOT 0.5 09/02/2024   ALKPHOS 96 09/02/2024   AST 24 09/02/2024   ALT 34 09/02/2024   ANIONGAP 10 09/19/2024    GFR: Estimated Creatinine Clearance: 72.9 mL/min (by C-G formula based  on SCr of 1.05 mg/dL).  No results found for this or any previous visit (from the past 240 hours).    Radiology Studies: No results found.    LOS: 19 days    Elgin Lam, MD [facility] 09/26/2024,  1:17 PM   If 7PM-7AM, please contact night-coverage [email or web address]  "

## 2024-09-27 NOTE — Progress Notes (Signed)
 "  PROGRESS NOTE    Matthew Roman  FMW:[id number] DOB: [birth year] DOA: 09/02/2024 PCP: [facility]   Brief Narrative: Matthew Roman is a 69 y.o. male with a history of dementia, TIAs, cirrhosis, diabetes mellitus type 2, hyperlipidemia, hepatic encephalopathy, tobacco use, sleep apnea.  Patient presented secondary to confusion and left-sided neglect, found to have evidence of an acute stroke. Neurology consulted. Patient started on dual antiplatelet therapy with aspirin  and Plavix .   Assessment and Plan:  Acute stroke Patient presented with symptoms including confusion. Initial CT head (12/12) significant for no acute intracranial hemorrhage or acute territorial infarction. MRI brain (12/12) confirms a small acute vs early subacute infarction involving the posterior right frontal lobe subcortical white matter. CTA head and neck significant for no large vessel occlusion or hemodynamically significant stenosis. LDL of 58. Hemoglobin A1C of 5.5%. Transthoracic Echocardiogram significant for an LVEF of 60-65% with no atrial level shunt. Neurology recommendations (12/14) for Plavix  indefinitely and for three weeks of aspirin  (completed course). PT/OT recommendations for acute inpatient rehabilitation. - Continue Plavix   Acute metabolic encephalopathy Present on admission. Presumed secondary to patient's stroke and complicated by underlying dementia. EEG obtained and suggested generalized cerebral dysfunction without seizures or epileptiform discharges. Mental status improved.  Vascular dementia Cognitive impairment Noted.  Primary hypertension - Continue amlodipine   Diabetes mellitus type 2 Well controlled based on hemoglobin A1C of 5.5%.  Hyperlipidemia - Continue Lipitor  Tobacco abuse Counseled this admission.  Moderate malnutrition - Dietitian recommendations (12/30): Continue heart-healthy diet. Continue Ensure Plus High Protein PO to TID. Each supplement provides  350 Kcals and 20 grams of protein. Continue Multivitamin PO once daily.   DVT prophylaxis: Subcutaneous heparin  Code Status:   Code Status: Full Code Family Communication: None at bedside Disposition Plan: Discharge to SNF pending bed availability/TOC   Consultants:  Neurology  Procedures:  Transthoracic Echocardiogram  Antimicrobials: None    Subjective: No concerns today. Has not heard from social work.  Objective: BP 126/63 (BP Location: Left Arm)   Pulse 64   Temp 98.7 F (37.1 C) (Oral)   Resp 17   Ht 6' (1.829 m)   Wt 80.2 kg   SpO2 98%   BMI 23.98 kg/m   Examination:  General exam: Appears calm and comfortable. Walking around his room in the dark. Respiratory system: Respiratory effort normal. Central nervous system: Alert.    Data Reviewed: I have personally reviewed following labs and imaging studies  CBC Lab Results  Component Value Date   WBC 5.2 09/19/2024   RBC 5.07 09/19/2024   HGB 15.2 09/19/2024   HCT 45.0 09/19/2024   MCV 88.8 09/19/2024   MCH 30.0 09/19/2024   PLT 144 (L) 09/19/2024   MCHC 33.8 09/19/2024   RDW 15.6 (H) 09/19/2024   LYMPHSABS 0.7 09/06/2024   MONOABS 0.4 09/06/2024   EOSABS 0.1 09/06/2024   BASOSABS 0.0 09/06/2024     Last metabolic panel Lab Results  Component Value Date   NA 139 09/19/2024   K 4.7 09/19/2024   CL 103 09/19/2024   CO2 26 09/19/2024   BUN 23 09/19/2024   CREATININE 1.05 09/19/2024   GLUCOSE 122 (H) 09/19/2024   GFRNONAA >60 09/19/2024   GFRAA >60 09/06/2018   CALCIUM  9.8 09/19/2024   PROT 5.9 (L) 09/02/2024   ALBUMIN 3.6 09/02/2024   BILITOT 0.5 09/02/2024   ALKPHOS 96 09/02/2024   AST 24 09/02/2024   ALT 34 09/02/2024   ANIONGAP 10 09/19/2024    GFR:  Estimated Creatinine Clearance: 72.9 mL/min (by C-G formula based on SCr of 1.05 mg/dL).  No results found for this or any previous visit (from the past 240 hours).    Radiology Studies: No results found.    LOS: 20 days     Elgin Lam, MD [facility] 09/27/2024, 12:26 PM   If 7PM-7AM, please contact night-coverage [email or web address]  "

## 2024-09-27 NOTE — TOC Progression Note (Signed)
 Transition of Care [facility]) - Progression Note    Patient Details  Name: Matthew Roman MRN: [id number] Date of Birth: 09-Jun-1956  [facility] CM/SW Contact  [name], [place] Phone Number: 09/27/2024, 3:21 PM  Clinical Narrative:     CSW sent patient referral to [facility] and (fax: [phone]) and [place] [facility] (fax: [phone]).   Expected Discharge Plan: Skilled Nursing Facility Barriers to Discharge: Insurance Authorization               Expected Discharge Plan and Services       Living arrangements for the past 2 months: Single Family Home                                       Social Drivers of Health (SDOH) Interventions SDOH Screenings   Food Insecurity: Patient Unable To Answer (09/03/2024)  Housing: Patient Unable To Answer (09/03/2024)  Transportation Needs: Patient Unable To Answer (09/03/2024)  Utilities: Patient Unable To Answer (09/03/2024)  Social Connections: Patient Unable To Answer (09/03/2024)  Tobacco Use: High Risk (09/03/2024)    Readmission Risk Interventions     No data to display

## 2024-09-27 NOTE — Progress Notes (Signed)
 Physical Therapy Treatment Patient Details Name: Matthew Roman MRN: [id number] DOB: 15-Mar-1956 Today's Date: 09/27/2024   History of Present Illness 69 y/o M presenting to ED On 12/12 with aphasia and L neglect, code stroke activated. MRI with small punctate areas of restricted diffusion in R cerebral hemisphere.    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    PT Comments  Goals adequately met for d/c from acute services. Ambulating at a mod I level. Tolerating dynamic gait/balance challenges without overt LOB. Safety concerns remain primarily cognitive in nature. Patient completed BERG balance test and scored greater than established goal, classified now as moderate fall risk with suggested use of SPC outdoors on uneven surfaces. Recommend supervision at d/c for safety.   Patient discharged from PT services secondary to goals met and no further acute PT needs identified.    Progress and discharge plan discussed with patient.     If plan is discharge home, recommend the following: A little help with bathing/dressing/bathroom;Assistance with cooking/housework;Direct supervision/assist for medications management;Direct supervision/assist for financial management;Assist for transportation;Help with stairs or ramp for entrance;Supervision due to cognitive status   Can travel by private vehicle        Equipment Recommendations  None recommended by PT    Recommendations for Other Services       Precautions / Restrictions Precautions Precautions: Fall Recall of Precautions/Restrictions: Impaired Restrictions Weight Bearing Restrictions Per Provider Order: No     Mobility  Bed Mobility               General bed mobility comments: patient in recliner upon entry    Transfers Overall transfer level: Modified independent Equipment used: None               General transfer comment: stands safely  without assist. performed from various surfaces unassisted.    Ambulation/Gait Ambulation/Gait assistance: Modified independent (Device/Increase time) Gait Distance (Feet): 275 Feet Assistive device: None Gait Pattern/deviations: Step-through pattern, Shuffle, Decreased stride length Gait velocity: decreased Gait velocity interpretation: <1.8 ft/sec, indicate of risk for recurrent falls   General Gait Details: Mod I, shows ability to intermittently become aware and correct shuffled steps. No overt LOB or buckling. Tolerated dynamic challenges without LOB, including backwards steps, quick turns, and various speeds.   Stairs             Wheelchair Mobility     Tilt Bed    Modified Rankin (Stroke Patients Only)       Balance Overall balance assessment: Modified Independent                               Standardized Balance Assessment Standardized Balance Assessment : Berg Balance Test Berg Balance Test Sit to Stand: Able to stand without using hands and stabilize independently Standing Unsupported: Able to stand safely 2 minutes Sitting with Back Unsupported but Feet Supported on Floor or Stool: Able to sit safely and securely 2 minutes Stand to Sit: Sits safely with minimal use of hands Transfers: Able to transfer safely, minor use of hands Standing Unsupported with Eyes Closed: Able to stand 10 seconds safely Standing Ubsupported with Feet Together: Able to place feet together independently and stand 1 minute safely From Standing, Reach Forward with Outstretched Arm: Can reach forward >12 cm safely (5) From Standing Position, Pick up Object from Floor: Able to pick up shoe safely  and easily From Standing Position, Turn to Look Behind Over each Shoulder: Looks behind from both sides and weight shifts well Turn 360 Degrees: Able to turn 360 degrees safely in 4 seconds or less Standing Unsupported, Alternately Place Feet on Step/Stool: Able to complete 4  steps without aid or supervision Standing Unsupported, One Foot in Front: Able to plae foot ahead of the other independently and hold 30 seconds Standing on One Leg: Tries to lift leg/unable to hold 3 seconds but remains standing independently Total Score: 49        Communication Communication Communication: No apparent difficulties  Cognition Arousal: Alert Behavior During Therapy: Flat affect   PT - Cognitive impairments: No family/caregiver present to determine baseline, Awareness, Memory, Attention, Initiation, Sequencing, Problem solving, Safety/Judgement                         Following commands: Intact      Cueing Cueing Techniques: Verbal cues  Exercises      General Comments        Pertinent Vitals/Pain Pain Assessment Pain Assessment: No/denies pain    Home Living                          Prior Function            PT Goals (current goals can now be found in the care plan section) Acute Rehab PT Goals Patient Stated Goal: Go home Progress towards PT goals: Progressing toward goals    Frequency    Min 2X/week      PT Plan      Co-evaluation              AM-PAC PT 6 Clicks Mobility   Outcome Measure  Help needed turning from your back to your side while in a flat bed without using bedrails?: None Help needed moving from lying on your back to sitting on the side of a flat bed without using bedrails?: None Help needed moving to and from a bed to a chair (including a wheelchair)?: None Help needed standing up from a chair using your arms (e.g., wheelchair or bedside chair)?: None Help needed to walk in hospital room?: None Help needed climbing 3-5 steps with a railing? : A Little 6 Click Score: 23    End of Session Equipment Utilized During Treatment: Gait belt Activity Tolerance: Patient tolerated treatment well Patient left: in chair;with call bell/phone within reach   PT Visit Diagnosis: Unsteadiness on feet  (R26.81);Other abnormalities of gait and mobility (R26.89);Difficulty in walking, not elsewhere classified (R26.2);Other symptoms and signs involving the nervous system (R29.898)     Time: 8582-8572 PT Time Calculation (min) (ACUTE ONLY): 10 min  Charges:    $Gait Training: 8-22 mins PT General Charges $$ ACUTE PT VISIT: 1 Visit                     Leontine Roads, PT, DPT [facility]  Rehabilitation Services Physical Therapist Office: [phone] Website: [email or web address]    Leontine GORMAN Roads 09/27/2024, 3:50 PM

## 2024-09-27 NOTE — Plan of Care (Signed)
" °  Problem: Education: Goal: Knowledge of disease or condition will improve Outcome: Progressing Goal: Knowledge of secondary prevention will improve (MUST DOCUMENT ALL) Outcome: Progressing Goal: Knowledge of patient specific risk factors will improve (DELETE if not current risk factor) Outcome: Progressing   Problem: Coping: Goal: Will verbalize positive feelings about self Outcome: Progressing Goal: Will identify appropriate support needs Outcome: Progressing   Problem: Health Behavior/Discharge Planning: Goal: Ability to manage health-related needs will improve Outcome: Progressing Goal: Goals will be collaboratively established with patient/family Outcome: Progressing   Problem: Self-Care: Goal: Ability to participate in self-care as condition permits will improve Outcome: Progressing Goal: Verbalization of feelings and concerns over difficulty with self-care will improve Outcome: Progressing Goal: Ability to communicate needs accurately will improve Outcome: Progressing   Problem: Nutrition: Goal: Dietary intake will improve Outcome: Progressing   Problem: Education: Goal: Ability to describe self-care measures that may prevent or decrease complications (Diabetes Survival Skills Education) will improve Outcome: Progressing Goal: Individualized Educational Video(s) Outcome: Progressing   Problem: Coping: Goal: Ability to adjust to condition or change in health will improve Outcome: Progressing   Problem: Health Behavior/Discharge Planning: Goal: Ability to manage health-related needs will improve Outcome: Progressing   Problem: Nutritional: Goal: Maintenance of adequate nutrition will improve Outcome: Progressing Goal: Progress toward achieving an optimal weight will improve Outcome: Progressing   Problem: Skin Integrity: Goal: Risk for impaired skin integrity will decrease Outcome: Progressing   Problem: Education: Goal: Knowledge of General Education  information will improve Description: Including pain rating scale, medication(s)/side effects and non-pharmacologic comfort measures Outcome: Progressing   Problem: Health Behavior/Discharge Planning: Goal: Ability to manage health-related needs will improve Outcome: Progressing   Problem: Clinical Measurements: Goal: Ability to maintain clinical measurements within normal limits will improve Outcome: Progressing Goal: Will remain free from infection Outcome: Progressing Goal: Diagnostic test results will improve Outcome: Progressing   Problem: Activity: Goal: Risk for activity intolerance will decrease Outcome: Progressing   Problem: Nutrition: Goal: Adequate nutrition will be maintained Outcome: Progressing   Problem: Coping: Goal: Level of anxiety will decrease Outcome: Progressing   Problem: Elimination: Goal: Will not experience complications related to bowel motility Outcome: Progressing Goal: Will not experience complications related to urinary retention Outcome: Progressing   Problem: Pain Managment: Goal: General experience of comfort will improve and/or be controlled Outcome: Progressing   Problem: Safety: Goal: Ability to remain free from injury will improve Outcome: Progressing   Problem: Skin Integrity: Goal: Risk for impaired skin integrity will decrease Outcome: Progressing   Problem: Education: Goal: Knowledge of disease or condition will improve Outcome: Progressing Goal: Knowledge of secondary prevention will improve (MUST DOCUMENT ALL) Outcome: Progressing Goal: Knowledge of patient specific risk factors will improve (DELETE if not current risk factor) Outcome: Progressing   Problem: Coping: Goal: Will verbalize positive feelings about self Outcome: Progressing Goal: Will identify appropriate support needs Outcome: Progressing   Problem: Health Behavior/Discharge Planning: Goal: Ability to manage health-related needs will  improve Outcome: Progressing Goal: Goals will be collaboratively established with patient/family Outcome: Progressing   Problem: Self-Care: Goal: Ability to participate in self-care as condition permits will improve Outcome: Progressing Goal: Verbalization of feelings and concerns over difficulty with self-care will improve Outcome: Progressing Goal: Ability to communicate needs accurately will improve Outcome: Progressing   Problem: Nutrition: Goal: Dietary intake will improve Outcome: Progressing   "

## 2024-09-28 DIAGNOSIS — I639 Cerebral infarction, unspecified: Secondary | ICD-10-CM | POA: Diagnosis not present

## 2024-09-28 LAB — BASIC METABOLIC PANEL WITH GFR
Anion gap: 9 (ref 5–15)
BUN: 21 mg/dL (ref 8–23)
CO2: 26 mmol/L (ref 22–32)
Calcium: 9.2 mg/dL (ref 8.9–10.3)
Chloride: 106 mmol/L (ref 98–111)
Creatinine, Ser: 0.93 mg/dL (ref 0.61–1.24)
GFR, Estimated: >60 mL/min
Glucose, Bld: 139 mg/dL — ABNORMAL HIGH (ref 70–99)
Potassium: 4.6 mmol/L (ref 3.5–5.1)
Sodium: 140 mmol/L (ref 135–145)

## 2024-09-28 LAB — CBC
HCT: 39.6 % (ref 39.0–52.0)
Hemoglobin: 13.1 g/dL (ref 13.0–17.0)
MCH: 29.8 pg (ref 26.0–34.0)
MCHC: 33.1 g/dL (ref 30.0–36.0)
MCV: 90.2 fL (ref 80.0–100.0)
Platelets: 104 K/uL — ABNORMAL LOW (ref 150–400)
RBC: 4.39 MIL/uL (ref 4.22–5.81)
RDW: 15.3 % (ref 11.5–15.5)
WBC: 5.1 K/uL (ref 4.0–10.5)
nRBC: 0 % (ref 0.0–0.2)

## 2024-09-28 NOTE — Plan of Care (Signed)
" °  Problem: Health Behavior/Discharge Planning: Goal: Ability to manage health-related needs will improve Outcome: Not Progressing Goal: Goals will be collaboratively established with patient/family Outcome: Not Progressing   Problem: Self-Care: Goal: Ability to participate in self-care as condition permits will improve Outcome: Not Progressing   "

## 2024-09-28 NOTE — Progress Notes (Signed)
 " PROGRESS NOTE    Matthew Roman  FMW:[id number] DOB: 22-Nov-1955 DOA: 09/02/2024 PCP: [facility]   Brief Narrative: 69 year old with past medical history significant for dementia, TIA, cirrhosis, diabetes type 2, hyperlipidemia, hepatic encephalopathy, tobacco use, sleep apnea.  Patient presents secondary to confusion and left-sided neglect found to have evidence of an acute stroke.  Neurology consulted.  Patient started on dual antiplatelet therapy with aspirin  and Plavix .  Currently awaiting placement.   Assessment & Plan:   Principal Problem:   Acute stroke due to ischemia [facility]) Active Problems:   Hyperlipidemia   Vascular dementia, unspecified severity, without behavioral disturbance, psychotic disturbance, mood disturbance, and anxiety (HCC)   AMS (altered mental status)   HTN (hypertension)   Malnutrition of moderate degree  1-Acute stroke - Patient presents with confusion.  Initial CT head 12/12 no significant acute intracranial infarct or hemorrhage. - MRI brain 12/12 confirmed small acute versus early subacute infarct in the posterior right frontal lobe subcortical white matter. - CTA head and neck no large vessel occlusion LDL 58, A1c 5.5. 2D echo left ventricular ejection fraction 65% no atrial level shunt. Neurology recommended Plavix  indefinitely and 3 weeks of aspirin  which completed course PT OT recommended inpatient rehab.  Insurance denied Continue Plavix   Acute metabolic encephalopathy Present on admission presumably secondary to acute stroke complicated by underlying dementia - EEG suggested generalized cerebral dysfunction without seizure - Ammonia 26  Vascular dementia Cognitive impairment - Noted - B12 1200, TSH 1.004, HIV nonreactive  Hypertension: -Continue amlodipine   Diabetes type 2 A1c 5.5  Hyperlipidemia -Continue Lipitor  Tobacco abuse Counseled.  Moderate malnutrition Continue supplements  Thrombocytopenia:  Monitor       Nutrition Problem: Moderate Malnutrition Etiology: social / environmental circumstances    Signs/Symptoms: mild fat depletion, mild muscle depletion    Interventions: Ensure Enlive (each supplement provides 350kcal and 20 grams of protein), MVI  Estimated body mass index is 23.98 kg/m as calculated from the following:   Height as of this encounter: 6' (1.829 m).   Weight as of this encounter: 80.2 kg.   DVT prophylaxis: SCDs Code Status: Full code Family Communication: Care discussed with patient Disposition Plan:  Status is: Inpatient Remains inpatient appropriate because: Awaiting disposition    Consultants:  Neurology  Procedures:  ECHO  Antimicrobials:    Subjective: He is alert, pleasant, confuse.   Objective: Vitals:   09/27/24 1629 09/27/24 2055 09/28/24 0038 09/28/24 0440  BP: (!) 147/81 (!) 140/74 (!) 144/66 128/72  Pulse: 70 64 74 65  Resp: 18 18    Temp: 98.4 F (36.9 C) 98.1 F (36.7 C) 97.9 F (36.6 C) 98 F (36.7 C)  TempSrc: Oral Oral    SpO2: 97% 99% 96% 96%  Weight:      Height:       No intake or output data in the 24 hours ending 09/28/24 0818 Filed Weights   09/02/24 1440  Weight: 80.2 kg    Examination:  General exam: Appears calm and comfortable  Respiratory system: Clear to auscultation. Respiratory effort normal. Cardiovascular system: S1 & S2 heard, RRR. No JVD, murmurs, rubs, gallops or clicks. No pedal edema. Gastrointestinal system: Abdomen is nondistended, soft and nontender. No organomegaly or masses felt. Normal bowel sounds heard. Central nervous system: Alert and oriented.  Extremities: Symmetric 5 x 5 power.   Data Reviewed: I have personally reviewed following labs and imaging studies  CBC: Recent Labs  Lab 09/28/24 0258  WBC 5.1  HGB  13.1  HCT 39.6  MCV 90.2  PLT 104*   Basic Metabolic Panel: Recent Labs  Lab 09/28/24 0258  NA 140  K 4.6  CL 106  CO2 26  GLUCOSE 139*   BUN 21  CREATININE 0.93  CALCIUM  9.2   GFR: Estimated Creatinine Clearance: 82.3 mL/min (by C-G formula based on SCr of 0.93 mg/dL). Liver Function Tests: No results for input(s): AST, ALT, ALKPHOS, BILITOT, PROT, ALBUMIN in the last 168 hours. No results for input(s): LIPASE, AMYLASE in the last 168 hours. No results for input(s): AMMONIA in the last 168 hours. Coagulation Profile: No results for input(s): INR, PROTIME in the last 168 hours. Cardiac Enzymes: No results for input(s): CKTOTAL, CKMB, CKMBINDEX, TROPONINI in the last 168 hours. BNP (last 3 results) No results for input(s): PROBNP in the last 8760 hours. HbA1C: No results for input(s): HGBA1C in the last 72 hours. CBG: No results for input(s): GLUCAP in the last 168 hours. Lipid Profile: No results for input(s): CHOL, HDL, LDLCALC, TRIG, CHOLHDL, LDLDIRECT in the last 72 hours. Thyroid Function Tests: No results for input(s): TSH, T4TOTAL, FREET4, T3FREE, THYROIDAB in the last 72 hours. Anemia Panel: No results for input(s): VITAMINB12, FOLATE, FERRITIN, TIBC, IRON, RETICCTPCT in the last 72 hours. Sepsis Labs: No results for input(s): PROCALCITON, LATICACIDVEN in the last 168 hours.  No results found for this or any previous visit (from the past 240 hours).       Radiology Studies: No results found.      Scheduled Meds:   stroke: early stages of recovery book   Does not apply Once   amLODipine   5 mg Oral Daily   atorvastatin   40 mg Oral q1800   clopidogrel   75 mg Oral Daily   docusate sodium   100 mg Oral BID   famotidine   20 mg Oral BID   feeding supplement  237 mL Oral TID BM   multivitamin with minerals  1 tablet Oral Daily   polyethylene glycol  17 g Oral Daily   senna  2 tablet Oral QHS   sodium chloride  flush  3 mL Intravenous Once   Continuous Infusions:   LOS: 21 days    Time spent: 35 Minutes    Sahmya Arai A  Staphany Ditton, MD [facility]   If 7PM-7AM, please contact night-coverage [email or web address]  09/28/2024, 8:18 AM   "

## 2024-09-28 NOTE — Plan of Care (Signed)
" °  Problem: Nutrition: Goal: Dietary intake will improve Outcome: Progressing   Problem: Education: Goal: Knowledge of disease or condition will improve Outcome: Progressing Goal: Knowledge of secondary prevention will improve (MUST DOCUMENT ALL) Outcome: Progressing Goal: Knowledge of patient specific risk factors will improve (DELETE if not current risk factor) Outcome: Progressing   "

## 2024-09-29 DIAGNOSIS — I639 Cerebral infarction, unspecified: Secondary | ICD-10-CM | POA: Diagnosis not present

## 2024-09-29 MED ORDER — THIAMINE MONONITRATE 100 MG PO TABS
100.0000 mg | ORAL_TABLET | Freq: Every day | ORAL | Status: AC
  Administered 2024-09-29 – 2024-10-28 (×30): 100 mg via ORAL
  Filled 2024-09-29 (×29): qty 1

## 2024-09-29 NOTE — Plan of Care (Signed)
" °  Problem: Education: Goal: Knowledge of disease or condition will improve Outcome: Progressing Goal: Knowledge of secondary prevention will improve (MUST DOCUMENT ALL) Outcome: Progressing Goal: Knowledge of patient specific risk factors will improve (DELETE if not current risk factor) Outcome: Progressing   Problem: Coping: Goal: Will verbalize positive feelings about self Outcome: Progressing Goal: Will identify appropriate support needs Outcome: Progressing   Problem: Health Behavior/Discharge Planning: Goal: Ability to manage health-related needs will improve Outcome: Progressing Goal: Goals will be collaboratively established with patient/family Outcome: Progressing   Problem: Self-Care: Goal: Ability to participate in self-care as condition permits will improve Outcome: Progressing Goal: Verbalization of feelings and concerns over difficulty with self-care will improve Outcome: Progressing Goal: Ability to communicate needs accurately will improve Outcome: Progressing   Problem: Nutrition: Goal: Dietary intake will improve Outcome: Progressing   Problem: Education: Goal: Ability to describe self-care measures that may prevent or decrease complications (Diabetes Survival Skills Education) will improve Outcome: Progressing Goal: Individualized Educational Video(s) Outcome: Progressing   Problem: Coping: Goal: Ability to adjust to condition or change in health will improve Outcome: Progressing   Problem: Health Behavior/Discharge Planning: Goal: Ability to manage health-related needs will improve Outcome: Progressing   Problem: Nutritional: Goal: Maintenance of adequate nutrition will improve Outcome: Progressing Goal: Progress toward achieving an optimal weight will improve Outcome: Progressing   Problem: Skin Integrity: Goal: Risk for impaired skin integrity will decrease Outcome: Progressing   Problem: Education: Goal: Knowledge of General Education  information will improve Description: Including pain rating scale, medication(s)/side effects and non-pharmacologic comfort measures Outcome: Progressing   Problem: Health Behavior/Discharge Planning: Goal: Ability to manage health-related needs will improve Outcome: Progressing   Problem: Clinical Measurements: Goal: Ability to maintain clinical measurements within normal limits will improve Outcome: Progressing Goal: Will remain free from infection Outcome: Progressing Goal: Diagnostic test results will improve Outcome: Progressing   Problem: Activity: Goal: Risk for activity intolerance will decrease Outcome: Progressing   Problem: Nutrition: Goal: Adequate nutrition will be maintained Outcome: Progressing   Problem: Coping: Goal: Level of anxiety will decrease Outcome: Progressing   Problem: Elimination: Goal: Will not experience complications related to bowel motility Outcome: Progressing Goal: Will not experience complications related to urinary retention Outcome: Progressing   Problem: Pain Managment: Goal: General experience of comfort will improve and/or be controlled Outcome: Progressing   Problem: Safety: Goal: Ability to remain free from injury will improve Outcome: Progressing   Problem: Skin Integrity: Goal: Risk for impaired skin integrity will decrease Outcome: Progressing   Problem: Education: Goal: Knowledge of disease or condition will improve Outcome: Progressing Goal: Knowledge of secondary prevention will improve (MUST DOCUMENT ALL) Outcome: Progressing Goal: Knowledge of patient specific risk factors will improve (DELETE if not current risk factor) Outcome: Progressing   Problem: Coping: Goal: Will verbalize positive feelings about self Outcome: Progressing Goal: Will identify appropriate support needs Outcome: Progressing   Problem: Health Behavior/Discharge Planning: Goal: Ability to manage health-related needs will  improve Outcome: Progressing Goal: Goals will be collaboratively established with patient/family Outcome: Progressing   Problem: Self-Care: Goal: Ability to participate in self-care as condition permits will improve Outcome: Progressing Goal: Verbalization of feelings and concerns over difficulty with self-care will improve Outcome: Progressing Goal: Ability to communicate needs accurately will improve Outcome: Progressing   Problem: Nutrition: Goal: Dietary intake will improve Outcome: Progressing   "

## 2024-09-29 NOTE — Progress Notes (Signed)
 " PROGRESS NOTE    Matthew Roman  FMW:[id number] DOB: 12-02-1955 DOA: 09/02/2024 PCP: [facility]   Brief Narrative: 69 year old with past medical history significant for dementia, TIA, cirrhosis, diabetes type 2, hyperlipidemia, hepatic encephalopathy, tobacco use, sleep apnea.  Patient presents secondary to confusion and left-sided neglect found to have evidence of an acute stroke.  Neurology consulted.  Patient started on dual antiplatelet therapy with aspirin  and Plavix .  Currently awaiting placement.   Assessment & Plan:   Principal Problem:   Acute stroke due to ischemia [facility]) Active Problems:   Hyperlipidemia   Vascular dementia, unspecified severity, without behavioral disturbance, psychotic disturbance, mood disturbance, and anxiety (HCC)   AMS (altered mental status)   HTN (hypertension)   Malnutrition of moderate degree  1-Acute stroke - Patient presents with confusion.  Initial CT head 12/12 no significant acute intracranial infarct or hemorrhage. - MRI brain 12/12 confirmed small acute versus early subacute infarct in the posterior right frontal lobe subcortical white matter. - CTA head and neck no large vessel occlusion LDL 58, A1c 5.5. 2D echo left ventricular ejection fraction 65% no atrial level shunt. Neurology recommended Plavix  indefinitely and 3 weeks of aspirin  which completed course PT OT recommended inpatient rehab.  Insurance denied Continue Plavix   Acute metabolic encephalopathy Present on admission presumably secondary to acute stroke complicated by underlying dementia - EEG suggested generalized cerebral dysfunction without seizure - Ammonia 26  Vascular dementia Cognitive impairment - Noted - B12 1200, TSH 1.004, HIV nonreactive -check Thiamine  level Pending.  Started Thiamine  supplement.   Hypertension: -Continue amlodipine   Diabetes type 2 A1c 5.5 Well controlled.   Hyperlipidemia -Continue Lipitor  Tobacco  abuse Counseled.  Moderate malnutrition Continue supplements  Thrombocytopenia: Monitor. Improving.        Nutrition Problem: Moderate Malnutrition Etiology: social / environmental circumstances    Signs/Symptoms: mild fat depletion, mild muscle depletion    Interventions: Ensure Enlive (each supplement provides 350kcal and 20 grams of protein), MVI  Estimated body mass index is 23.98 kg/m as calculated from the following:   Height as of this encounter: 6' (1.829 m).   Weight as of this encounter: 80.2 kg.   DVT prophylaxis: SCDs Code Status: Full code Family Communication: Care discussed with patient Disposition Plan:  Status is: Inpatient Remains inpatient appropriate because: Awaiting disposition,    Consultants:  Neurology  Procedures:  ECHO  Antimicrobials:    Subjective: Alert, no complaints.  Ambulate room and hall.   Objective: Vitals:   09/28/24 1610 09/28/24 1919 09/29/24 0040 09/29/24 0424  BP: (!) 134/95 124/81 (!) 115/53 (!) 119/53  Pulse: 75 87 66 (!) 58  Resp: 17 18 18 17   Temp: 97.9 F (36.6 C) 98 F (36.7 C) 98 F (36.7 C) 97.7 F (36.5 C)  TempSrc: Oral Oral Oral Oral  SpO2: 95% 97% 95% 95%  Weight:      Height:       No intake or output data in the 24 hours ending 09/29/24 0724 Filed Weights   09/02/24 1440  Weight: 80.2 kg    Examination:  General exam: NAD Respiratory system: CTA Cardiovascular system: S 1, S 2 RRR Gastrointestinal system: BS present, soft, nt   Data Reviewed: I have personally reviewed following labs and imaging studies  CBC: Recent Labs  Lab 09/28/24 0258  WBC 5.1  HGB 13.1  HCT 39.6  MCV 90.2  PLT 104*   Basic Metabolic Panel: Recent Labs  Lab 09/28/24 0258  NA 140  K 4.6  CL 106  CO2 26  GLUCOSE 139*  BUN 21  CREATININE 0.93  CALCIUM  9.2   GFR: Estimated Creatinine Clearance: 82.3 mL/min (by C-G formula based on SCr of 0.93 mg/dL). Liver Function Tests: No results  for input(s): AST, ALT, ALKPHOS, BILITOT, PROT, ALBUMIN in the last 168 hours. No results for input(s): LIPASE, AMYLASE in the last 168 hours. No results for input(s): AMMONIA in the last 168 hours. Coagulation Profile: No results for input(s): INR, PROTIME in the last 168 hours. Cardiac Enzymes: No results for input(s): CKTOTAL, CKMB, CKMBINDEX, TROPONINI in the last 168 hours. BNP (last 3 results) No results for input(s): PROBNP in the last 8760 hours. HbA1C: No results for input(s): HGBA1C in the last 72 hours. CBG: No results for input(s): GLUCAP in the last 168 hours. Lipid Profile: No results for input(s): CHOL, HDL, LDLCALC, TRIG, CHOLHDL, LDLDIRECT in the last 72 hours. Thyroid Function Tests: No results for input(s): TSH, T4TOTAL, FREET4, T3FREE, THYROIDAB in the last 72 hours. Anemia Panel: No results for input(s): VITAMINB12, FOLATE, FERRITIN, TIBC, IRON, RETICCTPCT in the last 72 hours. Sepsis Labs: No results for input(s): PROCALCITON, LATICACIDVEN in the last 168 hours.  No results found for this or any previous visit (from the past 240 hours).       Radiology Studies: No results found.      Scheduled Meds:   stroke: early stages of recovery book   Does not apply Once   amLODipine   5 mg Oral Daily   atorvastatin   40 mg Oral q1800   clopidogrel   75 mg Oral Daily   docusate sodium   100 mg Oral BID   famotidine   20 mg Oral BID   feeding supplement  237 mL Oral TID BM   multivitamin with minerals  1 tablet Oral Daily   polyethylene glycol  17 g Oral Daily   senna  2 tablet Oral QHS   sodium chloride  flush  3 mL Intravenous Once   Continuous Infusions:   LOS: 22 days    Time spent: 35 Minutes    Matthew Roman Matthew Hornbaker, MD [facility]   If 7PM-7AM, please contact night-coverage [email or web address]  09/29/2024, 7:24 AM   "

## 2024-09-30 DIAGNOSIS — I639 Cerebral infarction, unspecified: Secondary | ICD-10-CM | POA: Diagnosis not present

## 2024-09-30 NOTE — Progress Notes (Signed)
 " PROGRESS NOTE    Matthew Roman  FMW:[id number] DOB: 1955-10-04 DOA: 09/02/2024 PCP: [facility]   Brief Narrative: 69 year old with past medical history significant for dementia, TIA, cirrhosis, diabetes type 2, hyperlipidemia, hepatic encephalopathy, tobacco use, sleep apnea.  Patient presents secondary to confusion and left-sided neglect found to have evidence of an acute stroke.  Neurology consulted.  Patient started on dual antiplatelet therapy with aspirin  and Plavix .  Currently awaiting placement.   Assessment & Plan:   Principal Problem:   Acute stroke due to ischemia [facility]) Active Problems:   Hyperlipidemia   Vascular dementia, unspecified severity, without behavioral disturbance, psychotic disturbance, mood disturbance, and anxiety (HCC)   AMS (altered mental status)   HTN (hypertension)   Malnutrition of moderate degree  1-Acute stroke - Patient presents with confusion.  Initial CT head 12/12 no significant acute intracranial infarct or hemorrhage. - MRI brain 12/12 confirmed small acute versus early subacute infarct in the posterior right frontal lobe subcortical white matter. - CTA head and neck no large vessel occlusion LDL 58, A1c 5.5. 2D echo left ventricular ejection fraction 65% no atrial level shunt. Neurology recommended Plavix  indefinitely and 3 weeks of aspirin  which completed course PT OT recommended inpatient rehab.  Insurance denied Continue Plavix   Acute metabolic encephalopathy Present on admission presumably secondary to acute stroke complicated by underlying dementia - EEG suggested generalized cerebral dysfunction without seizure - Ammonia 26  Vascular dementia Cognitive impairment - Noted - B12 1200, TSH 1.004, HIV nonreactive -Thiamine  level Pending.  Started Thiamine  supplement.   Hypertension: -Continue amlodipine   Diabetes type 2 A1c 5.5 Well controlled.   Hyperlipidemia -Continue Lipitor  Tobacco  abuse Counseled.  Moderate malnutrition Continue supplements  Thrombocytopenia: Monitor. Improving.        Nutrition Problem: Moderate Malnutrition Etiology: social / environmental circumstances    Signs/Symptoms: mild fat depletion, mild muscle depletion    Interventions: Ensure Enlive (each supplement provides 350kcal and 20 grams of protein), MVI  Estimated body mass index is 23.98 kg/m as calculated from the following:   Height as of this encounter: 6' (1.829 m).   Weight as of this encounter: 80.2 kg.   DVT prophylaxis: SCDs Code Status: Full code Family Communication: Care discussed with patient Disposition Plan:  Status is: Inpatient Remains inpatient appropriate because: Awaiting disposition,    Consultants:  Neurology  Procedures:  ECHO  Antimicrobials:    Subjective: Alert, no complaints. Answer questions.   Objective: Vitals:   09/29/24 1739 09/29/24 2100 09/30/24 0435 09/30/24 0835  BP: (!) 145/79 (!) 112/49 132/64 129/85  Pulse: 74 67 62 76  Resp:    20  Temp: 98.4 F (36.9 C) 98.3 F (36.8 C) 98 F (36.7 C) (!) 97.3 F (36.3 C)  TempSrc:    Oral  SpO2: 96% 96% 97% 99%  Weight:      Height:        Intake/Output Summary (Last 24 hours) at 09/30/2024 1339 Last data filed at 09/30/2024 1257 Gross per 24 hour  Intake 536 ml  Output --  Net 536 ml   Filed Weights   09/02/24 1440  Weight: 80.2 kg    Examination:  General exam: NAD Respiratory system: CTA Cardiovascular system: SS 1, S 2 RRR Gastrointestinal system: BS present, soft, nt   Data Reviewed: I have personally reviewed following labs and imaging studies  CBC: Recent Labs  Lab 09/28/24 0258  WBC 5.1  HGB 13.1  HCT 39.6  MCV 90.2  PLT 104*  Basic Metabolic Panel: Recent Labs  Lab 09/28/24 0258  NA 140  K 4.6  CL 106  CO2 26  GLUCOSE 139*  BUN 21  CREATININE 0.93  CALCIUM  9.2   GFR: Estimated Creatinine Clearance: 82.3 mL/min (by C-G formula  based on SCr of 0.93 mg/dL). Liver Function Tests: No results for input(s): AST, ALT, ALKPHOS, BILITOT, PROT, ALBUMIN in the last 168 hours. No results for input(s): LIPASE, AMYLASE in the last 168 hours. No results for input(s): AMMONIA in the last 168 hours. Coagulation Profile: No results for input(s): INR, PROTIME in the last 168 hours. Cardiac Enzymes: No results for input(s): CKTOTAL, CKMB, CKMBINDEX, TROPONINI in the last 168 hours. BNP (last 3 results) No results for input(s): PROBNP in the last 8760 hours. HbA1C: No results for input(s): HGBA1C in the last 72 hours. CBG: No results for input(s): GLUCAP in the last 168 hours. Lipid Profile: No results for input(s): CHOL, HDL, LDLCALC, TRIG, CHOLHDL, LDLDIRECT in the last 72 hours. Thyroid Function Tests: No results for input(s): TSH, T4TOTAL, FREET4, T3FREE, THYROIDAB in the last 72 hours. Anemia Panel: No results for input(s): VITAMINB12, FOLATE, FERRITIN, TIBC, IRON, RETICCTPCT in the last 72 hours. Sepsis Labs: No results for input(s): PROCALCITON, LATICACIDVEN in the last 168 hours.  No results found for this or any previous visit (from the past 240 hours).       Radiology Studies: No results found.      Scheduled Meds:   stroke: early stages of recovery book   Does not apply Once   amLODipine   5 mg Oral Daily   atorvastatin   40 mg Oral q1800   clopidogrel   75 mg Oral Daily   docusate sodium   100 mg Oral BID   famotidine   20 mg Oral BID   feeding supplement  237 mL Oral TID BM   multivitamin with minerals  1 tablet Oral Daily   polyethylene glycol  17 g Oral Daily   senna  2 tablet Oral QHS   sodium chloride  flush  3 mL Intravenous Once   thiamine   100 mg Oral Daily   Continuous Infusions:   LOS: 23 days    Time spent: 35 Minutes    Doranne Schmutz A Marlene Beidler, MD [facility]   If 7PM-7AM, please contact  night-coverage [email or web address]  09/30/2024, 1:39 PM   "

## 2024-09-30 NOTE — Plan of Care (Signed)
 " Problem: Education: Goal: Knowledge of disease or condition will improve 09/30/2024 0655 by Honora Frederick PARAS, RN Outcome: Progressing 09/29/2024 2148 by Honora Frederick PARAS, RN Outcome: Progressing Goal: Knowledge of secondary prevention will improve (MUST DOCUMENT ALL) 09/30/2024 0655 by Honora Frederick PARAS, RN Outcome: Progressing 09/29/2024 2148 by Honora Frederick PARAS, RN Outcome: Progressing Goal: Knowledge of patient specific risk factors will improve (DELETE if not current risk factor) 09/30/2024 0655 by Honora Frederick PARAS, RN Outcome: Progressing 09/29/2024 2148 by Honora Frederick PARAS, RN Outcome: Progressing   Problem: Coping: Goal: Will verbalize positive feelings about self 09/30/2024 0655 by Honora Frederick PARAS, RN Outcome: Progressing 09/29/2024 2148 by Honora Frederick PARAS, RN Outcome: Progressing Goal: Will identify appropriate support needs 09/30/2024 0655 by Honora Frederick PARAS, RN Outcome: Progressing 09/29/2024 2148 by Honora Frederick PARAS, RN Outcome: Progressing   Problem: Health Behavior/Discharge Planning: Goal: Ability to manage health-related needs will improve 09/30/2024 0655 by Honora Frederick PARAS, RN Outcome: Progressing 09/29/2024 2148 by Honora Frederick PARAS, RN Outcome: Progressing Goal: Goals will be collaboratively established with patient/family 09/30/2024 [phone] by Honora Frederick PARAS, RN Outcome: Progressing 09/29/2024 2148 by Honora Frederick PARAS, RN Outcome: Progressing   Problem: Self-Care: Goal: Ability to participate in self-care as condition permits will improve 09/30/2024 0655 by Honora Frederick PARAS, RN Outcome: Progressing 09/29/2024 2148 by Honora Frederick PARAS, RN Outcome: Progressing Goal: Verbalization of feelings and concerns over difficulty with self-care will improve 09/30/2024 0655 by Honora Frederick PARAS, RN Outcome: Progressing 09/29/2024 2148 by Honora Frederick PARAS, RN Outcome: Progressing Goal: Ability to communicate needs accurately will improve 09/30/2024 0655 by  Honora Frederick PARAS, RN Outcome: Progressing 09/29/2024 2148 by Honora Frederick PARAS, RN Outcome: Progressing   Problem: Nutrition: Goal: Dietary intake will improve 09/30/2024 0655 by Honora Frederick PARAS, RN Outcome: Progressing 09/29/2024 2148 by Honora Frederick PARAS, RN Outcome: Progressing   Problem: Education: Goal: Ability to describe self-care measures that may prevent or decrease complications (Diabetes Survival Skills Education) will improve 09/30/2024 0655 by Honora Frederick PARAS, RN Outcome: Progressing 09/29/2024 2148 by Honora Frederick PARAS, RN Outcome: Progressing Goal: Individualized Educational Video(s) 09/30/2024 0655 by Honora Frederick PARAS, RN Outcome: Progressing 09/29/2024 2148 by Honora Frederick PARAS, RN Outcome: Progressing   Problem: Coping: Goal: Ability to adjust to condition or change in health will improve 09/30/2024 0655 by Honora Frederick PARAS, RN Outcome: Progressing 09/29/2024 2148 by Honora Frederick PARAS, RN Outcome: Progressing   Problem: Health Behavior/Discharge Planning: Goal: Ability to manage health-related needs will improve 09/30/2024 0655 by Honora Frederick PARAS, RN Outcome: Progressing 09/29/2024 2148 by Honora Frederick PARAS, RN Outcome: Progressing   Problem: Nutritional: Goal: Maintenance of adequate nutrition will improve 09/30/2024 0655 by Honora Frederick PARAS, RN Outcome: Progressing 09/29/2024 2148 by Honora Frederick PARAS, RN Outcome: Progressing Goal: Progress toward achieving an optimal weight will improve 09/30/2024 0655 by Honora Frederick PARAS, RN Outcome: Progressing 09/29/2024 2148 by Honora Frederick PARAS, RN Outcome: Progressing   Problem: Skin Integrity: Goal: Risk for impaired skin integrity will decrease 09/30/2024 0655 by Honora Frederick PARAS, RN Outcome: Progressing 09/29/2024 2148 by Honora Frederick PARAS, RN Outcome: Progressing   Problem: Education: Goal: Knowledge of General Education information will improve Description: Including pain rating scale,  medication(s)/side effects and non-pharmacologic comfort measures 09/30/2024 0655 by Honora Frederick PARAS, RN Outcome: Progressing 09/29/2024 2148 by Honora Frederick PARAS, RN Outcome: Progressing   Problem: Health Behavior/Discharge Planning: Goal: Ability to manage health-related needs will improve 09/30/2024 0655 by Honora Frederick PARAS, RN Outcome: Progressing 09/29/2024 2148  by Honora Frederick PARAS, RN Outcome: Progressing   Problem: Clinical Measurements: Goal: Ability to maintain clinical measurements within normal limits will improve 09/30/2024 0655 by Honora Frederick PARAS, RN Outcome: Progressing 09/29/2024 2148 by Honora Frederick PARAS, RN Outcome: Progressing Goal: Will remain free from infection 09/30/2024 0655 by Honora Frederick PARAS, RN Outcome: Progressing 09/29/2024 2148 by Honora Frederick PARAS, RN Outcome: Progressing Goal: Diagnostic test results will improve 09/30/2024 0655 by Honora Frederick PARAS, RN Outcome: Progressing 09/29/2024 2148 by Honora Frederick PARAS, RN Outcome: Progressing   Problem: Activity: Goal: Risk for activity intolerance will decrease 09/30/2024 0655 by Honora Frederick PARAS, RN Outcome: Progressing 09/29/2024 2148 by Honora Frederick PARAS, RN Outcome: Progressing   Problem: Nutrition: Goal: Adequate nutrition will be maintained 09/30/2024 0655 by Honora Frederick PARAS, RN Outcome: Progressing 09/29/2024 2148 by Honora Frederick PARAS, RN Outcome: Progressing   Problem: Coping: Goal: Level of anxiety will decrease 09/30/2024 0655 by Honora Frederick PARAS, RN Outcome: Progressing 09/29/2024 2148 by Honora Frederick PARAS, RN Outcome: Progressing   Problem: Elimination: Goal: Will not experience complications related to bowel motility 09/30/2024 0655 by Honora Frederick PARAS, RN Outcome: Progressing 09/29/2024 2148 by Honora Frederick PARAS, RN Outcome: Progressing Goal: Will not experience complications related to urinary retention 09/30/2024 0655 by Honora Frederick PARAS, RN Outcome: Progressing 09/29/2024  2148 by Honora Frederick PARAS, RN Outcome: Progressing   Problem: Pain Managment: Goal: General experience of comfort will improve and/or be controlled 09/30/2024 0655 by Honora Frederick PARAS, RN Outcome: Progressing 09/29/2024 2148 by Honora Frederick PARAS, RN Outcome: Progressing   Problem: Safety: Goal: Ability to remain free from injury will improve 09/30/2024 0655 by Honora Frederick PARAS, RN Outcome: Progressing 09/29/2024 2148 by Honora Frederick PARAS, RN Outcome: Progressing   Problem: Skin Integrity: Goal: Risk for impaired skin integrity will decrease 09/30/2024 0655 by Honora Frederick PARAS, RN Outcome: Progressing 09/29/2024 2148 by Honora Frederick PARAS, RN Outcome: Progressing   Problem: Education: Goal: Knowledge of disease or condition will improve 09/30/2024 0655 by Honora Frederick PARAS, RN Outcome: Progressing 09/29/2024 2148 by Honora Frederick PARAS, RN Outcome: Progressing Goal: Knowledge of secondary prevention will improve (MUST DOCUMENT ALL) 09/30/2024 0655 by Honora Frederick PARAS, RN Outcome: Progressing 09/29/2024 2148 by Honora Frederick PARAS, RN Outcome: Progressing Goal: Knowledge of patient specific risk factors will improve (DELETE if not current risk factor) 09/30/2024 0655 by Honora Frederick PARAS, RN Outcome: Progressing 09/29/2024 2148 by Honora Frederick PARAS, RN Outcome: Progressing   Problem: Coping: Goal: Will verbalize positive feelings about self 09/30/2024 0655 by Honora Frederick PARAS, RN Outcome: Progressing 09/29/2024 2148 by Honora Frederick PARAS, RN Outcome: Progressing Goal: Will identify appropriate support needs 09/30/2024 0655 by Honora Frederick PARAS, RN Outcome: Progressing 09/29/2024 2148 by Honora Frederick PARAS, RN Outcome: Progressing   Problem: Health Behavior/Discharge Planning: Goal: Ability to manage health-related needs will improve 09/30/2024 0655 by Honora Frederick PARAS, RN Outcome: Progressing 09/29/2024 2148 by Honora Frederick PARAS, RN Outcome: Progressing Goal: Goals will be  collaboratively established with patient/family 09/30/2024 [phone] by Honora Frederick PARAS, RN Outcome: Progressing 09/29/2024 2148 by Honora Frederick PARAS, RN Outcome: Progressing   Problem: Self-Care: Goal: Ability to participate in self-care as condition permits will improve 09/30/2024 0655 by Honora Frederick PARAS, RN Outcome: Progressing 09/29/2024 2148 by Honora Frederick PARAS, RN Outcome: Progressing Goal: Verbalization of feelings and concerns over difficulty with self-care will improve 09/30/2024 0655 by Honora Frederick PARAS, RN Outcome: Progressing 09/29/2024 2148 by Honora Frederick PARAS, RN Outcome: Progressing Goal: Ability to communicate needs accurately  will improve 09/30/2024 0655 by Honora Frederick PARAS, RN Outcome: Progressing 09/29/2024 2148 by Honora Frederick PARAS, RN Outcome: Progressing   Problem: Nutrition: Goal: Dietary intake will improve 09/30/2024 0655 by Honora Frederick PARAS, RN Outcome: Progressing 09/29/2024 2148 by Honora Frederick PARAS, RN Outcome: Progressing   "

## 2024-09-30 NOTE — TOC Progression Note (Signed)
 Transition of Care [facility]) - Progression Note    Patient Details  Name: Matthew Roman MRN: [id number] Date of Birth: 22-Aug-1956  Transition of Care [facility]) CM/SW Contact  [name], [place] Phone Number: 09/30/2024, 4:47 PM  Clinical Narrative:     CSW reached out to [place] at the [facility] to make sure facility had received paperwork. Niels informed CSW patient's referral is being reviewed and Niels will reach out when there is a decision. Facility accepts patient's Medicare plan. CSW will continue to monitor and update DC plan.   Expected Discharge Plan: Skilled Nursing Facility Barriers to Discharge: Insurance Authorization               Expected Discharge Plan and Services       Living arrangements for the past 2 months: Single Family Home                                       Social Drivers of Health (SDOH) Interventions SDOH Screenings   Food Insecurity: Patient Unable To Answer (09/03/2024)  Housing: Patient Unable To Answer (09/03/2024)  Transportation Needs: Patient Unable To Answer (09/03/2024)  Utilities: Patient Unable To Answer (09/03/2024)  Social Connections: Patient Unable To Answer (09/03/2024)  Tobacco Use: High Risk (09/03/2024)    Readmission Risk Interventions     No data to display

## 2024-09-30 NOTE — Plan of Care (Signed)
" °  Problem: Health Behavior/Discharge Planning: Goal: Ability to manage health-related needs will improve Outcome: Progressing   Problem: Self-Care: Goal: Ability to participate in self-care as condition permits will improve Outcome: Progressing   Problem: Clinical Measurements: Goal: Ability to maintain clinical measurements within normal limits will improve Outcome: Progressing   Problem: Safety: Goal: Ability to remain free from injury will improve Outcome: Progressing   "

## 2024-10-01 DIAGNOSIS — I639 Cerebral infarction, unspecified: Secondary | ICD-10-CM | POA: Diagnosis not present

## 2024-10-01 NOTE — Plan of Care (Signed)
" °  Problem: Education: Goal: Knowledge of disease or condition will improve Outcome: Progressing Goal: Knowledge of secondary prevention will improve (MUST DOCUMENT ALL) Outcome: Progressing Goal: Knowledge of patient specific risk factors will improve (DELETE if not current risk factor) Outcome: Progressing   Problem: Coping: Goal: Will verbalize positive feelings about self Outcome: Progressing Goal: Will identify appropriate support needs Outcome: Progressing   Problem: Health Behavior/Discharge Planning: Goal: Ability to manage health-related needs will improve Outcome: Progressing Goal: Goals will be collaboratively established with patient/family Outcome: Progressing   Problem: Self-Care: Goal: Ability to participate in self-care as condition permits will improve Outcome: Progressing Goal: Verbalization of feelings and concerns over difficulty with self-care will improve Outcome: Progressing Goal: Ability to communicate needs accurately will improve Outcome: Progressing   Problem: Nutrition: Goal: Dietary intake will improve Outcome: Progressing   Problem: Education: Goal: Ability to describe self-care measures that may prevent or decrease complications (Diabetes Survival Skills Education) will improve Outcome: Progressing Goal: Individualized Educational Video(s) Outcome: Progressing   Problem: Coping: Goal: Ability to adjust to condition or change in health will improve Outcome: Progressing   Problem: Health Behavior/Discharge Planning: Goal: Ability to manage health-related needs will improve Outcome: Progressing   Problem: Nutritional: Goal: Maintenance of adequate nutrition will improve Outcome: Progressing Goal: Progress toward achieving an optimal weight will improve Outcome: Progressing   Problem: Skin Integrity: Goal: Risk for impaired skin integrity will decrease Outcome: Progressing   Problem: Education: Goal: Knowledge of General Education  information will improve Description: Including pain rating scale, medication(s)/side effects and non-pharmacologic comfort measures Outcome: Progressing   Problem: Health Behavior/Discharge Planning: Goal: Ability to manage health-related needs will improve Outcome: Progressing   Problem: Clinical Measurements: Goal: Ability to maintain clinical measurements within normal limits will improve Outcome: Progressing Goal: Will remain free from infection Outcome: Progressing Goal: Diagnostic test results will improve Outcome: Progressing   Problem: Activity: Goal: Risk for activity intolerance will decrease Outcome: Progressing   Problem: Nutrition: Goal: Adequate nutrition will be maintained Outcome: Progressing   Problem: Coping: Goal: Level of anxiety will decrease Outcome: Progressing   Problem: Elimination: Goal: Will not experience complications related to bowel motility Outcome: Progressing Goal: Will not experience complications related to urinary retention Outcome: Progressing   Problem: Pain Managment: Goal: General experience of comfort will improve and/or be controlled Outcome: Progressing   Problem: Safety: Goal: Ability to remain free from injury will improve Outcome: Progressing   Problem: Skin Integrity: Goal: Risk for impaired skin integrity will decrease Outcome: Progressing   Problem: Education: Goal: Knowledge of disease or condition will improve Outcome: Progressing Goal: Knowledge of secondary prevention will improve (MUST DOCUMENT ALL) Outcome: Progressing Goal: Knowledge of patient specific risk factors will improve (DELETE if not current risk factor) Outcome: Progressing   Problem: Coping: Goal: Will verbalize positive feelings about self Outcome: Progressing Goal: Will identify appropriate support needs Outcome: Progressing   Problem: Health Behavior/Discharge Planning: Goal: Ability to manage health-related needs will  improve Outcome: Progressing Goal: Goals will be collaboratively established with patient/family Outcome: Progressing   Problem: Self-Care: Goal: Ability to participate in self-care as condition permits will improve Outcome: Progressing Goal: Verbalization of feelings and concerns over difficulty with self-care will improve Outcome: Progressing Goal: Ability to communicate needs accurately will improve Outcome: Progressing   Problem: Nutrition: Goal: Dietary intake will improve Outcome: Progressing   "

## 2024-10-01 NOTE — Progress Notes (Signed)
 " PROGRESS NOTE    Matthew Roman  FMW:[id number] DOB: [birth year] DOA: 09/02/2024 [facility]CP: [facility]   Brief Narrative: 69 year old with past medical history significant for dementia, TIA, cirrhosis, diabetes type 2, hyperlipidemia, hepatic encephalopathy, tobacco use, sleep apnea.  Patient presents secondary to confusion and left-sided neglect found to have evidence of an acute stroke.  Neurology consulted.  Patient started on dual antiplatelet therapy with aspirin  and Plavix .  Currently awaiting placement.   Assessment & Plan:   Principal Problem:   Acute stroke due to ischemia [facility]) Active Problems:   Hyperlipidemia   Vascular dementia, unspecified severity, without behavioral disturbance, psychotic disturbance, mood disturbance, and anxiety (HCC)   AMS (altered mental status)   HTN (hypertension)   Malnutrition of moderate degree  1-Acute stroke - Patient presents with confusion.  Initial CT head 12/12 no significant acute intracranial infarct or hemorrhage. - MRI brain 12/12 confirmed small acute versus early subacute infarct in the posterior right frontal lobe subcortical white matter. - CTA head and neck no large vessel occlusion LDL 58, A1c 5.5. 2D echo left ventricular ejection fraction 65% no atrial level shunt. Neurology recommended Plavix  indefinitely and 3 weeks of aspirin  which completed course PT OT recommended inpatient rehab.  Insurance denied Continue Plavix   Acute metabolic encephalopathy Present on admission presumably secondary to acute stroke complicated by underlying dementia - EEG suggested generalized cerebral dysfunction without seizure - Ammonia 26  Vascular dementia Cognitive impairment - Noted - B12 1200, TSH 1.004, HIV nonreactive -Thiamine  level Pending.  Started Thiamine  supplement.   Hypertension: -Continue amlodipine   Diabetes type 2 A1c 5.5 Well controlled.   Hyperlipidemia -Continue Lipitor  Tobacco  abuse Counseled.  Moderate malnutrition Continue supplements  Thrombocytopenia: Monitor. Improving.        Nutrition Problem: Moderate Malnutrition Etiology: social / environmental circumstances    Signs/Symptoms: mild fat depletion, mild muscle depletion    Interventions: Ensure Enlive (each supplement provides 350kcal and 20 grams of protein), MVI  Estimated body mass index is 23.98 kg/m as calculated from the following:   Height as of this encounter: 6' (1.829 m).   Weight as of this encounter: 80.2 kg.   DVT prophylaxis: SCDs Code Status: Full code Family Communication: Care discussed with patient Disposition Plan:  Status is: Inpatient Remains inpatient appropriate because: Awaiting disposition,    Consultants:  Neurology  Procedures:  ECHO  Antimicrobials:    Subjective: Sitting in the recliner, he is pleasant, denies any pain  Objective: Vitals:   09/30/24 1629 09/30/24 2024 10/01/24 0005 10/01/24 0450  BP: 128/71 (!) 151/72 (!) 112/58 108/64  Pulse: 64 74 (!) 59 65  Resp: 20     Temp: 98.4 F (36.9 C) 98.1 F (36.7 C) 98.2 F (36.8 C) 97.7 F (36.5 C)  TempSrc: Oral     SpO2: 96% 99% 97% 96%  Weight:      Height:        Intake/Output Summary (Last 24 hours) at 10/01/2024 9278 Last data filed at 09/30/2024 1850 Gross per 24 hour  Intake 654 ml  Output --  Net 654 ml   Filed Weights   09/02/24 1440  Weight: 80.2 kg    Examination:  General exam: No acute distress Respiratory system: Clear to auscultation Cardiovascular system: 1 S2 regular rhythm and rate Gastrointestinal system:  bowel sounds present, soft nontender nondistended   Data Reviewed: I have personally reviewed following labs and imaging studies  CBC: Recent Labs  Lab 09/28/24 0258  WBC 5.1  HGB 13.1  HCT 39.6  MCV 90.2  PLT 104*   Basic Metabolic Panel: Recent Labs  Lab 09/28/24 0258  NA 140  K 4.6  CL 106  CO2 26  GLUCOSE 139*  BUN 21   CREATININE 0.93  CALCIUM  9.2   GFR: Estimated Creatinine Clearance: 82.3 mL/min (by C-G formula based on SCr of 0.93 mg/dL). Liver Function Tests: No results for input(s): AST, ALT, ALKPHOS, BILITOT, PROT, ALBUMIN in the last 168 hours. No results for input(s): LIPASE, AMYLASE in the last 168 hours. No results for input(s): AMMONIA in the last 168 hours. Coagulation Profile: No results for input(s): INR, PROTIME in the last 168 hours. Cardiac Enzymes: No results for input(s): CKTOTAL, CKMB, CKMBINDEX, TROPONINI in the last 168 hours. BNP (last 3 results) No results for input(s): PROBNP in the last 8760 hours. HbA1C: No results for input(s): HGBA1C in the last 72 hours. CBG: No results for input(s): GLUCAP in the last 168 hours. Lipid Profile: No results for input(s): CHOL, HDL, LDLCALC, TRIG, CHOLHDL, LDLDIRECT in the last 72 hours. Thyroid Function Tests: No results for input(s): TSH, T4TOTAL, FREET4, T3FREE, THYROIDAB in the last 72 hours. Anemia Panel: No results for input(s): VITAMINB12, FOLATE, FERRITIN, TIBC, IRON, RETICCTPCT in the last 72 hours. Sepsis Labs: No results for input(s): PROCALCITON, LATICACIDVEN in the last 168 hours.  No results found for this or any previous visit (from the past 240 hours).       Radiology Studies: No results found.      Scheduled Meds:   stroke: early stages of recovery book   Does not apply Once   amLODipine   5 mg Oral Daily   atorvastatin   40 mg Oral q1800   clopidogrel   75 mg Oral Daily   docusate sodium   100 mg Oral BID   famotidine   20 mg Oral BID   feeding supplement  237 mL Oral TID BM   multivitamin with minerals  1 tablet Oral Daily   polyethylene glycol  17 g Oral Daily   senna  2 tablet Oral QHS   sodium chloride  flush  3 mL Intravenous Once   thiamine   100 mg Oral Daily   Continuous Infusions:   LOS: 24 days    Time spent: 35  Minutes    Matthew Sayed A Matthew Muralles, MD [facility]   If 7PM-7AM, please contact night-coverage [email or web address]  10/01/2024, 7:21 AM   "

## 2024-10-02 DIAGNOSIS — I639 Cerebral infarction, unspecified: Secondary | ICD-10-CM | POA: Diagnosis not present

## 2024-10-02 LAB — VITAMIN B1: Vitamin B1 (Thiamine): 92.3 nmol/L (ref 66.5–200.0)

## 2024-10-02 NOTE — Plan of Care (Signed)
  Problem: Education: Goal: Knowledge of disease or condition will improve Outcome: Adequate for Discharge Goal: Knowledge of secondary prevention will improve (MUST DOCUMENT ALL) Outcome: Adequate for Discharge Goal: Knowledge of patient specific risk factors will improve (DELETE if not current risk factor) Outcome: Adequate for Discharge

## 2024-10-02 NOTE — Progress Notes (Signed)
 " PROGRESS NOTE    Matthew Roman  FMW:[id number] DOB: 1956/03/19 DOA: 09/02/2024 [facility]CP: [facility]   Brief Narrative: 69 year old with past medical history significant for dementia, TIA, cirrhosis, diabetes type 2, hyperlipidemia, hepatic encephalopathy, tobacco use, sleep apnea.  Patient presents secondary to confusion and left-sided neglect found to have evidence of an acute stroke.  Neurology consulted.  Patient started on dual antiplatelet therapy with aspirin  and Plavix .  Currently awaiting placement.   Assessment & Plan:   Principal Problem:   Acute stroke due to ischemia [facility]) Active Problems:   Hyperlipidemia   Vascular dementia, unspecified severity, without behavioral disturbance, psychotic disturbance, mood disturbance, and anxiety (HCC)   AMS (altered mental status)   HTN (hypertension)   Malnutrition of moderate degree  1-Acute stroke - Patient presents with confusion.  Initial CT head 12/12 no significant acute intracranial infarct or hemorrhage. - MRI brain 12/12 confirmed small acute versus early subacute infarct in the posterior right frontal lobe subcortical white matter. - CTA head and neck no large vessel occlusion LDL 58, A1c 5.5. 2D echo left ventricular ejection fraction 65% no atrial level shunt. Neurology recommended Plavix  indefinitely and 3 weeks of aspirin  which completed course PT OT recommended inpatient rehab.  Insurance denied Continue Plavix   Acute metabolic encephalopathy Present on admission presumably secondary to acute stroke complicated by underlying dementia - EEG suggested generalized cerebral dysfunction without seizure - Ammonia 26  Vascular dementia Cognitive impairment - Noted - B12 1200, TSH 1.004, HIV nonreactive -Thiamine  level Pending.  Started Thiamine  supplement.   Hypertension: -Continue amlodipine   Diabetes type 2 A1c 5.5 Well controlled.   Hyperlipidemia -Continue Lipitor  Tobacco  abuse Counseled.  Moderate malnutrition Continue supplements  Thrombocytopenia: Monitor. Improving.        Nutrition Problem: Moderate Malnutrition Etiology: social / environmental circumstances    Signs/Symptoms: mild fat depletion, mild muscle depletion    Interventions: Ensure Enlive (each supplement provides 350kcal and 20 grams of protein), MVI  Estimated body mass index is 23.98 kg/m as calculated from the following:   Height as of this encounter: 6' (1.829 m).   Weight as of this encounter: 80.2 kg.   DVT prophylaxis: SCDs Code Status: Full code Family Communication: Care discussed with patient Disposition Plan:  Status is: Inpatient Remains inpatient appropriate because: Awaiting disposition,    Consultants:  Neurology  Procedures:  ECHO  Antimicrobials:    Subjective: Seen walking in the hall, no new complaints.   Objective: Vitals:   10/01/24 1157 10/01/24 1559 10/01/24 1955 10/02/24 0504  BP: 132/73 124/72 111/72 114/72  Pulse: (!) 57 (!) 57 63 (!) 56  Resp:  18    Temp: 98 F (36.7 C) 98.1 F (36.7 C) 98.1 F (36.7 C) 98.1 F (36.7 C)  TempSrc:  Oral    SpO2: 100% 97% 98% 94%  Weight:      Height:        Intake/Output Summary (Last 24 hours) at 10/02/2024 9257 Last data filed at 10/01/2024 0935 Gross per 24 hour  Intake 117 ml  Output --  Net 117 ml   Filed Weights   09/02/24 1440  Weight: 80.2 kg    Examination:  General exam: NAD Respiratory system:  CTA Cardiovascular system: S 1, S 2 RRR Gastrointestinal system:  BS present, soft, nt  Data Reviewed: I have personally reviewed following labs and imaging studies  CBC: Recent Labs  Lab 09/28/24 0258  WBC 5.1  HGB 13.1  HCT 39.6  MCV  90.2  PLT 104*   Basic Metabolic Panel: Recent Labs  Lab 09/28/24 0258  NA 140  K 4.6  CL 106  CO2 26  GLUCOSE 139*  BUN 21  CREATININE 0.93  CALCIUM  9.2   GFR: Estimated Creatinine Clearance: 82.3 mL/min (by C-G  formula based on SCr of 0.93 mg/dL). Liver Function Tests: No results for input(s): AST, ALT, ALKPHOS, BILITOT, PROT, ALBUMIN in the last 168 hours. No results for input(s): LIPASE, AMYLASE in the last 168 hours. No results for input(s): AMMONIA in the last 168 hours. Coagulation Profile: No results for input(s): INR, PROTIME in the last 168 hours. Cardiac Enzymes: No results for input(s): CKTOTAL, CKMB, CKMBINDEX, TROPONINI in the last 168 hours. BNP (last 3 results) No results for input(s): PROBNP in the last 8760 hours. HbA1C: No results for input(s): HGBA1C in the last 72 hours. CBG: No results for input(s): GLUCAP in the last 168 hours. Lipid Profile: No results for input(s): CHOL, HDL, LDLCALC, TRIG, CHOLHDL, LDLDIRECT in the last 72 hours. Thyroid Function Tests: No results for input(s): TSH, T4TOTAL, FREET4, T3FREE, THYROIDAB in the last 72 hours. Anemia Panel: No results for input(s): VITAMINB12, FOLATE, FERRITIN, TIBC, IRON, RETICCTPCT in the last 72 hours. Sepsis Labs: No results for input(s): PROCALCITON, LATICACIDVEN in the last 168 hours.  No results found for this or any previous visit (from the past 240 hours).       Radiology Studies: No results found.      Scheduled Meds:   stroke: early stages of recovery book   Does not apply Once   amLODipine   5 mg Oral Daily   atorvastatin   40 mg Oral q1800   clopidogrel   75 mg Oral Daily   docusate sodium   100 mg Oral BID   famotidine   20 mg Oral BID   feeding supplement  237 mL Oral TID BM   multivitamin with minerals  1 tablet Oral Daily   polyethylene glycol  17 g Oral Daily   senna  2 tablet Oral QHS   sodium chloride  flush  3 mL Intravenous Once   thiamine   100 mg Oral Daily   Continuous Infusions:   LOS: 25 days    Time spent: 35 Minutes    Matthew Roman A Matthew Lahey, MD [facility]   If 7PM-7AM, please contact  night-coverage [email or web address]  10/02/2024, 7:42 AM   "

## 2024-10-02 NOTE — Plan of Care (Signed)
" °  Problem: Education: Goal: Knowledge of disease or condition will improve 10/02/2024 1849 by Rosalva Charlies RAMAN, RN Outcome: Progressing 10/02/2024 1657 by Rosalva Charlies RAMAN, RN Outcome: Adequate for Discharge Goal: Knowledge of secondary prevention will improve (MUST DOCUMENT ALL) 10/02/2024 1849 by Rosalva Charlies RAMAN, RN Outcome: Progressing 10/02/2024 1657 by Rosalva Charlies RAMAN, RN Outcome: Adequate for Discharge Goal: Knowledge of patient specific risk factors will improve (DELETE if not current risk factor) 10/02/2024 1849 by Rosalva Charlies RAMAN, RN Outcome: Progressing 10/02/2024 1657 by Rosalva Charlies RAMAN, RN Outcome: Adequate for Discharge   "

## 2024-10-03 DIAGNOSIS — I639 Cerebral infarction, unspecified: Secondary | ICD-10-CM | POA: Diagnosis not present

## 2024-10-03 LAB — GLUCOSE, CAPILLARY: Glucose-Capillary: 151 mg/dL — ABNORMAL HIGH (ref 70–99)

## 2024-10-03 NOTE — Progress Notes (Signed)
 Nutrition Follow-up  DOCUMENTATION CODES:  Non-severe (moderate) malnutrition in context of social or environmental circumstances  INTERVENTION:  D/C Ensure, patient dislikes and is eating well. Add HS snack daily per patient request. RD to sign off, please re-consult if further nutrition needs arise.   NUTRITION DIAGNOSIS:  Moderate Malnutrition related to social / environmental circumstances as evidenced by mild fat depletion, mild muscle depletion; ongoing, but improving with good oral intake.  GOAL:  Patient will meet greater than or equal to 90% of their needs; met.  MONITOR:  PO intake, Supplement acceptance, Labs, Weight trends  REASON FOR ASSESSMENT:  Consult Assessment of nutrition requirement/status  ASSESSMENT:  Patient presented with confusion and L-sided neglect and was found to have acute ischemic stroke. PMH significant for stroke with dementia, multiple TIAs, liver cirrhosis, DM2, dyslipidemia, hepatic encephalopathy, tobacco, OSA, lives with caretaker.  Patient reports good intake of meals. He does not really like the Ensure supplements; has been drinking 1 per day. He would like to have a bedtime snack instead of the Ensure supplements. RD to order.   Admit weight: 80.2 kg No new weight available  Average Meal Intake: 12/29-1/10: 100% intake x 8 recorded meals  Nutritionally Relevant Medications: Scheduled Meds:   stroke: early stages of recovery book   Does not apply Once   amLODipine   5 mg Oral Daily   atorvastatin   40 mg Oral q1800   clopidogrel   75 mg Oral Daily   docusate sodium   100 mg Oral BID   famotidine   20 mg Oral BID   feeding supplement  237 mL Oral TID BM   multivitamin with minerals  1 tablet Oral Daily   polyethylene glycol  17 g Oral Daily   senna  2 tablet Oral QHS   sodium chloride  flush  3 mL Intravenous Once   thiamine   100 mg Oral Daily   Continuous Infusions: PRN Meds:.acetaminophen  **OR** acetaminophen  (TYLENOL ) oral liquid  160 mg/5 mL **OR** acetaminophen , albuterol , hydrALAZINE   Labs Reviewed.  Diet Order:   Diet Order             Diet Heart Room service appropriate? Yes; Fluid consistency: Thin  Diet effective now                   EDUCATION NEEDS:   Education needs have been addressed  Skin:  Skin Assessment: Reviewed RN Assessment  Last BM:  1/11  Height:   Ht Readings from Last 1 Encounters:  09/04/24 6' (1.829 m)    Weight:   Wt Readings from Last 1 Encounters:  09/02/24 80.2 kg    Ideal Body Weight:  81 kg  BMI:  Body mass index is 23.98 kg/m.  Estimated Nutritional Needs:   Kcal:  2000-2200  Protein:  100-120  Fluid:  >/=2000   Suzen HUNT RD, LDN, CNSC Contact via secure chat. If unavailable, use group chat RD Inpatient.

## 2024-10-03 NOTE — Plan of Care (Signed)
" °  Problem: Self-Care: Goal: Ability to participate in self-care as condition permits will improve Outcome: Progressing Goal: Ability to communicate needs accurately will improve Outcome: Progressing   Problem: Nutrition: Goal: Dietary intake will improve Outcome: Progressing   Problem: Health Behavior/Discharge Planning: Goal: Ability to manage health-related needs will improve Outcome: Not Progressing   "

## 2024-10-03 NOTE — TOC Progression Note (Signed)
 Transition of Care [facility]) - Progression Note    Patient Details  Name: Matthew Roman MRN: [id number] Date of Birth: [birth year]  Transition of Care [facility]) CM/SW Contact  [name], [place] Phone Number: 10/03/2024, 3:43 PM  Clinical Narrative:     CSW reached out to [facility] [place] to ask about placement for patient. Unfortunately, patient does not meet the 3 ADL requirement to come to facility. CSW reached out to patient's caregiver to provide update. CSW discussed memory care and long term placement based on patient's needs, and explained these placements are usually paid out of pocket. Caregiver shared she is unable to pay out of pocket for placement. CSW reached out to the granddaughter to provide the same update and choices, and granddaughter also said she is not able to pay out of pocket for placement and deferred decision making to Grayce, the caregiver. CSW let granddaughter know an APS report would be called in based on the lack of decision making from both parties and to help with placement. CSW called [facility] APS intake to make report and left VM with callback number. CSW will continue to follow.   Patient has been denied for SNF from both Medicare and the [place], and was not deemed a candidate for the [facility]. Possible placements for patient are locked memory care unit or longterm at a locked SNF. CSW will submit long term medicaid screening referral.   Expected Discharge Plan: Skilled Nursing Facility Barriers to Discharge: Insurance Authorization               Expected Discharge Plan and Services       Living arrangements for the past 2 months: Single Family Home                                       Social Drivers of Health (SDOH) Interventions SDOH Screenings   Food Insecurity: Patient Unable To Answer (09/03/2024)  Housing: Patient Unable To Answer (09/03/2024)  Transportation Needs: Patient Unable To Answer (09/03/2024)   Utilities: Patient Unable To Answer (09/03/2024)  Social Connections: Patient Unable To Answer (09/03/2024)  Tobacco Use: High Risk (09/03/2024)    Readmission Risk Interventions     No data to display

## 2024-10-03 NOTE — Plan of Care (Signed)
" °  Problem: Education: Goal: Knowledge of disease or condition will improve 10/03/2024 1850 by Rosalva Charlies RAMAN, RN Outcome: Progressing 10/03/2024 1850 by Rosalva Charlies RAMAN, RN Outcome: Progressing Goal: Knowledge of secondary prevention will improve (MUST DOCUMENT ALL) 10/03/2024 1850 by Rosalva Charlies RAMAN, RN Outcome: Progressing 10/03/2024 1850 by Rosalva Charlies RAMAN, RN Outcome: Progressing Goal: Knowledge of patient specific risk factors will improve (DELETE if not current risk factor) 10/03/2024 1850 by Rosalva Charlies RAMAN, RN Outcome: Progressing 10/03/2024 1850 by Rosalva Charlies RAMAN, RN Outcome: Progressing   "

## 2024-10-03 NOTE — Progress Notes (Signed)
 " PROGRESS NOTE    Matthew Roman  FMW:[id number] DOB: Sep 30, 1955 DOA: 09/02/2024 PCP: [facility], [facility] Roman   Brief Narrative: 69 year old with past medical history significant for dementia, TIA, cirrhosis, diabetes type 2, hyperlipidemia, hepatic encephalopathy, tobacco use, sleep apnea.  Patient presents secondary to confusion and left-sided neglect found to have evidence of an acute stroke.  Neurology consulted.  Patient started on dual antiplatelet therapy with aspirin  and Plavix .  Currently awaiting placement.   Assessment & Plan:   Principal Problem:   Acute stroke due to ischemia [facility]) Active Problems:   Hyperlipidemia   Vascular dementia, unspecified severity, without behavioral disturbance, psychotic disturbance, mood disturbance, and anxiety (HCC)   AMS (altered mental status)   HTN (hypertension)   Malnutrition of moderate degree  1-Acute stroke - Patient presents with confusion.  Initial CT head 12/12 no significant acute intracranial infarct or hemorrhage. - MRI brain 12/12 confirmed small acute versus early subacute infarct in the posterior right frontal lobe subcortical white matter. - CTA head and neck no large vessel occlusion LDL 58, A1c 5.5. 2D echo left ventricular ejection fraction 65% no atrial level shunt. Neurology recommended Plavix  indefinitely and 3 weeks of aspirin  which completed course PT OT recommended inpatient rehab.  Insurance denied Continue Plavix  Does not have VA benefit for rehab.   Acute metabolic encephalopathy Present on admission presumably secondary to acute stroke complicated by underlying dementia - EEG suggested generalized cerebral dysfunction without seizure - Ammonia 26  Vascular dementia Cognitive impairment - Noted - B12 1200, TSH 1.004, HIV nonreactive -Thiamine  level 94 Started Thiamine  supplement.   Hypertension: -Continue amlodipine   Diabetes type 2 A1c 5.5 Well controlled.   Hyperlipidemia -Continue  Lipitor  Tobacco abuse Counseled.  Moderate malnutrition Continue supplements  Thrombocytopenia: Monitor. Improving.        Nutrition Problem: Moderate Malnutrition Etiology: social / environmental circumstances    Signs/Symptoms: mild fat depletion, mild muscle depletion    Interventions: Ensure Enlive (each supplement provides 350kcal and 20 grams of protein), MVI  Estimated body mass index is 23.98 kg/m as calculated from the following:   Height as of this encounter: 6' (1.829 m).   Weight as of this encounter: 80.2 kg.   DVT prophylaxis: SCDs Code Status: Full code Family Communication: Care discussed with patient Disposition Plan:  Status is: Inpatient Remains inpatient appropriate because: Awaiting disposition,    Consultants:  Neurology  Procedures:  ECHO  Antimicrobials:    Subjective: He is alert, walking in the hall, no complaints.  Objective: Vitals:   10/02/24 0814 10/02/24 1627 10/02/24 2015 10/03/24 0806  BP: 120/75 130/78 (!) 146/77 129/71  Pulse: 66 66 86 65  Resp:    20  Temp: 98 F (36.7 C) 97.9 F (36.6 C)  97.8 F (36.6 C)  TempSrc:      SpO2: 96% 99% 98% 99%  Weight:      Height:       No intake or output data in the 24 hours ending 10/03/24 1244  Filed Weights   09/02/24 1440  Weight: 80.2 kg    Examination:  General exam: NAD Respiratory system:  CTA Cardiovascular system: S 1, S 2 RRR Gastrointestinal system:  BS present, soft, nt  Data Reviewed: I have personally reviewed following labs and imaging studies  CBC: Recent Labs  Lab 09/28/24 0258  WBC 5.1  HGB 13.1  HCT 39.6  MCV 90.2  PLT 104*   Basic Metabolic Panel: Recent Labs  Lab 09/28/24 0258  NA  140  K 4.6  CL 106  CO2 26  GLUCOSE 139*  BUN 21  CREATININE 0.93  CALCIUM  9.2   GFR: Estimated Creatinine Clearance: 82.3 mL/min (by C-G formula based on SCr of 0.93 mg/dL). Liver Function Tests: No results for input(s): AST, ALT,  ALKPHOS, BILITOT, PROT, ALBUMIN in the last 168 hours. No results for input(s): LIPASE, AMYLASE in the last 168 hours. No results for input(s): AMMONIA in the last 168 hours. Coagulation Profile: No results for input(s): INR, PROTIME in the last 168 hours. Cardiac Enzymes: No results for input(s): CKTOTAL, CKMB, CKMBINDEX, TROPONINI in the last 168 hours. BNP (last 3 results) No results for input(s): PROBNP in the last 8760 hours. HbA1C: No results for input(s): HGBA1C in the last 72 hours. CBG: No results for input(s): GLUCAP in the last 168 hours. Lipid Profile: No results for input(s): CHOL, HDL, LDLCALC, TRIG, CHOLHDL, LDLDIRECT in the last 72 hours. Thyroid Function Tests: No results for input(s): TSH, T4TOTAL, FREET4, T3FREE, THYROIDAB in the last 72 hours. Anemia Panel: No results for input(s): VITAMINB12, FOLATE, FERRITIN, TIBC, IRON, RETICCTPCT in the last 72 hours. Sepsis Labs: No results for input(s): PROCALCITON, LATICACIDVEN in the last 168 hours.  No results found for this or any previous visit (from the past 240 hours).       Radiology Studies: No results found.      Scheduled Meds:   stroke: early stages of recovery book   Does not apply Once   amLODipine   5 mg Oral Daily   atorvastatin   40 mg Oral q1800   clopidogrel   75 mg Oral Daily   docusate sodium   100 mg Oral BID   famotidine   20 mg Oral BID   multivitamin with minerals  1 tablet Oral Daily   polyethylene glycol  17 g Oral Daily   senna  2 tablet Oral QHS   sodium chloride  flush  3 mL Intravenous Once   thiamine   100 mg Oral Daily   Continuous Infusions:   LOS: 26 days    Time spent: 35 Minutes    Ronelle Smallman A Xena Propst, MD [facility]   If 7PM-7AM, please contact night-coverage [email or web address]  10/03/2024, 12:44 PM   "

## 2024-10-04 DIAGNOSIS — I639 Cerebral infarction, unspecified: Secondary | ICD-10-CM | POA: Diagnosis not present

## 2024-10-04 NOTE — Plan of Care (Signed)
" °  Problem: Education: Goal: Knowledge of disease or condition will improve Outcome: Progressing Goal: Knowledge of secondary prevention will improve (MUST DOCUMENT ALL) Outcome: Progressing Goal: Knowledge of patient specific risk factors will improve (DELETE if not current risk factor) Outcome: Progressing   Problem: Coping: Goal: Will verbalize positive feelings about self Outcome: Progressing Goal: Will identify appropriate support needs Outcome: Progressing   Problem: Health Behavior/Discharge Planning: Goal: Ability to manage health-related needs will improve Outcome: Progressing Goal: Goals will be collaboratively established with patient/family Outcome: Progressing   Problem: Self-Care: Goal: Ability to participate in self-care as condition permits will improve Outcome: Progressing Goal: Verbalization of feelings and concerns over difficulty with self-care will improve Outcome: Progressing Goal: Ability to communicate needs accurately will improve Outcome: Progressing   Problem: Nutrition: Goal: Dietary intake will improve Outcome: Progressing   Problem: Education: Goal: Ability to describe self-care measures that may prevent or decrease complications (Diabetes Survival Skills Education) will improve Outcome: Progressing Goal: Individualized Educational Video(s) Outcome: Progressing   Problem: Coping: Goal: Ability to adjust to condition or change in health will improve Outcome: Progressing   Problem: Health Behavior/Discharge Planning: Goal: Ability to manage health-related needs will improve Outcome: Progressing   Problem: Nutritional: Goal: Maintenance of adequate nutrition will improve Outcome: Progressing Goal: Progress toward achieving an optimal weight will improve Outcome: Progressing   Problem: Skin Integrity: Goal: Risk for impaired skin integrity will decrease Outcome: Progressing   Problem: Education: Goal: Knowledge of General Education  information will improve Description: Including pain rating scale, medication(s)/side effects and non-pharmacologic comfort measures Outcome: Progressing   Problem: Health Behavior/Discharge Planning: Goal: Ability to manage health-related needs will improve Outcome: Progressing   Problem: Clinical Measurements: Goal: Ability to maintain clinical measurements within normal limits will improve Outcome: Progressing Goal: Will remain free from infection Outcome: Progressing Goal: Diagnostic test results will improve Outcome: Progressing   Problem: Activity: Goal: Risk for activity intolerance will decrease Outcome: Progressing   Problem: Nutrition: Goal: Adequate nutrition will be maintained Outcome: Progressing   Problem: Coping: Goal: Level of anxiety will decrease Outcome: Progressing   Problem: Elimination: Goal: Will not experience complications related to bowel motility Outcome: Progressing Goal: Will not experience complications related to urinary retention Outcome: Progressing   Problem: Pain Managment: Goal: General experience of comfort will improve and/or be controlled Outcome: Progressing   Problem: Safety: Goal: Ability to remain free from injury will improve Outcome: Progressing   Problem: Skin Integrity: Goal: Risk for impaired skin integrity will decrease Outcome: Progressing   Problem: Education: Goal: Knowledge of disease or condition will improve Outcome: Progressing Goal: Knowledge of secondary prevention will improve (MUST DOCUMENT ALL) Outcome: Progressing Goal: Knowledge of patient specific risk factors will improve (DELETE if not current risk factor) Outcome: Progressing   Problem: Coping: Goal: Will verbalize positive feelings about self Outcome: Progressing Goal: Will identify appropriate support needs Outcome: Progressing   Problem: Health Behavior/Discharge Planning: Goal: Ability to manage health-related needs will  improve Outcome: Progressing Goal: Goals will be collaboratively established with patient/family Outcome: Progressing   Problem: Self-Care: Goal: Ability to participate in self-care as condition permits will improve Outcome: Progressing Goal: Verbalization of feelings and concerns over difficulty with self-care will improve Outcome: Progressing Goal: Ability to communicate needs accurately will improve Outcome: Progressing   Problem: Nutrition: Goal: Dietary intake will improve Outcome: Progressing   "

## 2024-10-04 NOTE — Progress Notes (Signed)
 " PROGRESS NOTE    Matthew Roman  FMW:[id number] DOB: Jan 21, 1956 DOA: 09/02/2024 PCP: [facility]   Brief Narrative: 69 year old with past medical history significant for dementia, TIA, cirrhosis, diabetes type 2, hyperlipidemia, hepatic encephalopathy, tobacco use, sleep apnea.  Patient presents secondary to confusion and left-sided neglect found to have evidence of an acute stroke.  Neurology consulted.  Patient started on dual antiplatelet therapy with aspirin  and Plavix .  Currently awaiting placement.   Assessment & Plan:   Principal Problem:   Acute stroke due to ischemia [facility]) Active Problems:   Hyperlipidemia   Vascular dementia, unspecified severity, without behavioral disturbance, psychotic disturbance, mood disturbance, and anxiety (HCC)   AMS (altered mental status)   HTN (hypertension)   Malnutrition of moderate degree  1-Acute stroke - Patient presents with confusion.  Initial CT head 12/12 no significant acute intracranial infarct or hemorrhage. - MRI brain 12/12 confirmed small acute versus early subacute infarct in the posterior right frontal lobe subcortical white matter. - CTA head and neck no large vessel occlusion LDL 58, A1c 5.5. 2D echo left ventricular ejection fraction 65% no atrial level shunt. Neurology recommended Plavix  indefinitely and 3 weeks of aspirin  which completed course PT OT recommended inpatient rehab.  Insurance denied Continue Plavix  Does not have VA benefit for rehab.   Acute metabolic encephalopathy Present on admission presumably secondary to acute stroke complicated by underlying dementia - EEG suggested generalized cerebral dysfunction without seizure - Ammonia 26  Vascular dementia Cognitive impairment - Noted - B12 1200, TSH 1.004, HIV nonreactive -Thiamine  level 94 -Started Thiamine  supplement.   Hypertension: -Continue amlodipine   Diabetes type 2 A1c 5.5 Well controlled.   Hyperlipidemia -Continue  Lipitor  Tobacco abuse Counseled.  Moderate malnutrition Continue supplements  Thrombocytopenia: Monitor. Improving.        Nutrition Problem: Moderate Malnutrition Etiology: social / environmental circumstances    Signs/Symptoms: mild fat depletion, mild muscle depletion    Interventions: Ensure Enlive (each supplement provides 350kcal and 20 grams of protein), MVI  Estimated body mass index is 23.98 kg/m as calculated from the following:   Height as of this encounter: 6' (1.829 m).   Weight as of this encounter: 80.2 kg.   DVT prophylaxis: SCDs Code Status: Full code Family Communication: Care discussed with patient Disposition Plan:  Status is: Inpatient Remains inpatient appropriate because: Awaiting disposition,    Consultants:  Neurology  Procedures:  ECHO  Antimicrobials:    Subjective: Walking in the hall, no new complaints.   Objective: Vitals:   10/03/24 2044 10/04/24 0042 10/04/24 0333 10/04/24 0808  BP: (!) 144/89 (!) 170/85 128/65 112/78  Pulse: 62 80 62 (!) 59  Resp: 18 18 18 20   Temp: 98.4 F (36.9 C) (!) 97.5 F (36.4 C) 97.8 F (36.6 C) 98.2 F (36.8 C)  TempSrc: Oral  Oral   SpO2: 99% 97% 96% 98%  Weight:      Height:        Intake/Output Summary (Last 24 hours) at 10/04/2024 1302 Last data filed at 10/04/2024 0700 Gross per 24 hour  Intake 236 ml  Output --  Net 236 ml    Filed Weights   09/02/24 1440  Weight: 80.2 kg    Examination:  General exam: NAD Respiratory system: CTA Cardiovascular system: S 1, S 2 RRR Gastrointestinal system: BS present, soft, nt  Data Reviewed: I have personally reviewed following labs and imaging studies  CBC: Recent Labs  Lab 09/28/24 0258  WBC 5.1  HGB  13.1  HCT 39.6  MCV 90.2  PLT 104*   Basic Metabolic Panel: Recent Labs  Lab 09/28/24 0258  NA 140  K 4.6  CL 106  CO2 26  GLUCOSE 139*  BUN 21  CREATININE 0.93  CALCIUM  9.2   GFR: Estimated Creatinine  Clearance: 82.3 mL/min (by C-G formula based on SCr of 0.93 mg/dL). Liver Function Tests: No results for input(s): AST, ALT, ALKPHOS, BILITOT, PROT, ALBUMIN in the last 168 hours. No results for input(s): LIPASE, AMYLASE in the last 168 hours. No results for input(s): AMMONIA in the last 168 hours. Coagulation Profile: No results for input(s): INR, PROTIME in the last 168 hours. Cardiac Enzymes: No results for input(s): CKTOTAL, CKMB, CKMBINDEX, TROPONINI in the last 168 hours. BNP (last 3 results) No results for input(s): PROBNP in the last 8760 hours. HbA1C: No results for input(s): HGBA1C in the last 72 hours. CBG: Recent Labs  Lab 10/03/24 2045  GLUCAP 151*   Lipid Profile: No results for input(s): CHOL, HDL, LDLCALC, TRIG, CHOLHDL, LDLDIRECT in the last 72 hours. Thyroid Function Tests: No results for input(s): TSH, T4TOTAL, FREET4, T3FREE, THYROIDAB in the last 72 hours. Anemia Panel: No results for input(s): VITAMINB12, FOLATE, FERRITIN, TIBC, IRON, RETICCTPCT in the last 72 hours. Sepsis Labs: No results for input(s): PROCALCITON, LATICACIDVEN in the last 168 hours.  No results found for this or any previous visit (from the past 240 hours).       Radiology Studies: No results found.      Scheduled Meds:   stroke: early stages of recovery book   Does not apply Once   amLODipine   5 mg Oral Daily   atorvastatin   40 mg Oral q1800   clopidogrel   75 mg Oral Daily   docusate sodium   100 mg Oral BID   famotidine   20 mg Oral BID   multivitamin with minerals  1 tablet Oral Daily   polyethylene glycol  17 g Oral Daily   senna  2 tablet Oral QHS   sodium chloride  flush  3 mL Intravenous Once   thiamine   100 mg Oral Daily   Continuous Infusions:   LOS: 27 days    Time spent: 35 Minutes    Amazin Pincock A Rawlin Reaume, MD [facility]   If 7PM-7AM, please contact  night-coverage [email or web address]  10/04/2024, 1:02 PM   "

## 2024-10-04 NOTE — Plan of Care (Signed)
  Problem: Education: Goal: Knowledge of disease or condition will improve Outcome: Progressing Goal: Knowledge of secondary prevention will improve (MUST DOCUMENT ALL) Outcome: Progressing Goal: Knowledge of patient specific risk factors will improve (DELETE if not current risk factor) Outcome: Progressing   Problem: Health Behavior/Discharge Planning: Goal: Ability to manage health-related needs will improve Outcome: Progressing Goal: Goals will be collaboratively established with patient/family Outcome: Progressing   Problem: Self-Care: Goal: Ability to participate in self-care as condition permits will improve Outcome: Progressing Goal: Verbalization of feelings and concerns over difficulty with self-care will improve Outcome: Progressing Goal: Ability to communicate needs accurately will improve Outcome: Progressing

## 2024-10-05 DIAGNOSIS — I639 Cerebral infarction, unspecified: Secondary | ICD-10-CM | POA: Diagnosis not present

## 2024-10-05 NOTE — Plan of Care (Signed)
" °  Problem: Education: Goal: Knowledge of disease or condition will improve Outcome: Progressing   Problem: Education: Goal: Knowledge of secondary prevention will improve (MUST DOCUMENT ALL) Outcome: Progressing   Problem: Education: Goal: Knowledge of patient specific risk factors will improve (DELETE if not current risk factor) Outcome: Progressing   Problem: Coping: Goal: Will identify appropriate support needs Outcome: Progressing   Problem: Pain Managment: Goal: General experience of comfort will improve and/or be controlled Outcome: Progressing   "

## 2024-10-05 NOTE — Progress Notes (Signed)
 " PROGRESS NOTE    Matthew Roman  FMW:[id number] DOB: 21-Sep-1956 DOA: 09/02/2024  PCP: [facility]   Brief Narrative:  This 69 year old Male with past medical history significant for dementia, TIA, cirrhosis, diabetes type 2, hyperlipidemia, hepatic encephalopathy, tobacco use, sleep apnea. Patient presents secondary to confusion and left-sided neglect found to have evidence of an acute stroke. Neurology consulted. Patient started on dual antiplatelet therapy with aspirin  and Plavix . Currently awaiting placement.   Assessment & Plan:   Principal Problem:   Acute stroke due to ischemia [facility]) Active Problems:   Hyperlipidemia   Vascular dementia, unspecified severity, without behavioral disturbance, psychotic disturbance, mood disturbance, and anxiety (HCC)   AMS (altered mental status)   HTN (hypertension)   Malnutrition of moderate degree   Acute stroke: Patient presented with confusion.  Initial CT head 12/12 > No significant acute intracranial infarct or hemorrhage. MRI brain 12/12 confirmed small acute versus early subacute infarct in the posterior right frontal lobe,  subcortical white matter. CTA head and neck >No large vessel occlusion. LDL 58, HbA1c 5.5. 2D echo left ventricular ejection fraction 65%,  No atrial level shunt. Neurology recommended Plavix  indefinitely and 3 weeks of aspirin  which he completed course. PT/ OT recommended inpatient rehab.  Insurance denied, P2P denied SNF Continue Plavix  Does not have VA benefit for rehab.    Acute metabolic encephalopathy: Present on admission presumably secondary to acute stroke complicated by underlying dementia. EEG suggested generalized cerebral dysfunction without seizure. Ammonia Level  26 > WNL He is back to baseline mental status.   Vascular dementia: Cognitive impairment: - Noted - B12 1200, TSH 1.004, HIV nonreactive. -Thiamine  level 94 -Continue Thiamine  supplement.    Hypertension: -Continue  amlodipine .   Diabetes type 2 HbA1c 5.5 Well controlled.    Hyperlipidemia -Continue Lipitor.   Tobacco abuse Counseled.   Moderate malnutrition Continue supplements.   Thrombocytopenia: Monitor. Improving.    DVT prophylaxis: SCDs Code Status: Full code Family Communication:No family at bed side Disposition Plan:   Status is: Inpatient Remains inpatient appropriate because: Difficult disposition.  TOC is aware   Consultants:  Neurology  Procedures: Echo   Antimicrobials:  Anti-infectives (From admission, onward)    None      Subjective: Patient was seen and examined at bedside.  Overnight events noted. Patient was sitting comfortably on the chair,  having breakfast.  Denies any concerns.  Objective: Vitals:   10/04/24 1647 10/04/24 2037 10/05/24 0508 10/05/24 0809  BP: 115/77 122/67 116/60 (!) 171/82  Pulse: 60 (!) 56 (!) 54 61  Resp:  17 18 15   Temp: 98.4 F (36.9 C) 98.3 F (36.8 C) 98.6 F (37 C) 98 F (36.7 C)  TempSrc: Oral Oral Oral   SpO2: 99% 98% 97% 100%  Weight:      Height:       No intake or output data in the 24 hours ending 10/05/24 1033 Filed Weights   09/02/24 1440  Weight: 80.2 kg    Examination:  General exam: Appears calm and comfortable, not in any acute distress. Respiratory system: Clear to auscultation. Respiratory effort normal.  RR 12 Cardiovascular system: S1 & S2 heard, RRR. No JVD, murmurs, rubs, gallops or clicks.  Gastrointestinal system: Abdomen is non distended, soft and non tender. . Normal bowel sounds heard. Central nervous system: Alert and oriented X 3. No focal neurological deficits. Extremities: No Edema, no cyanosis, no clubbing. Skin: No rashes, lesions or ulcers Psychiatry: Judgement and insight appear normal. Mood &  affect appropriate.     Data Reviewed: I have personally reviewed following labs and imaging studies  CBC: No results for input(s): WBC, NEUTROABS, HGB, HCT, MCV, PLT  in the last 168 hours. Basic Metabolic Panel: No results for input(s): NA, K, CL, CO2, GLUCOSE, BUN, CREATININE, CALCIUM , MG, PHOS in the last 168 hours. GFR: Estimated Creatinine Clearance: 82.3 mL/min (by C-G formula based on SCr of 0.93 mg/dL). Liver Function Tests: No results for input(s): AST, ALT, ALKPHOS, BILITOT, PROT, ALBUMIN in the last 168 hours. No results for input(s): LIPASE, AMYLASE in the last 168 hours. No results for input(s): AMMONIA in the last 168 hours. Coagulation Profile: No results for input(s): INR, PROTIME in the last 168 hours. Cardiac Enzymes: No results for input(s): CKTOTAL, CKMB, CKMBINDEX, TROPONINI in the last 168 hours. BNP (last 3 results) No results for input(s): PROBNP in the last 8760 hours. HbA1C: No results for input(s): HGBA1C in the last 72 hours. CBG: Recent Labs  Lab 10/03/24 2045  GLUCAP 151*   Lipid Profile: No results for input(s): CHOL, HDL, LDLCALC, TRIG, CHOLHDL, LDLDIRECT in the last 72 hours. Thyroid Function Tests: No results for input(s): TSH, T4TOTAL, FREET4, T3FREE, THYROIDAB in the last 72 hours. Anemia Panel: No results for input(s): VITAMINB12, FOLATE, FERRITIN, TIBC, IRON, RETICCTPCT in the last 72 hours. Sepsis Labs: No results for input(s): PROCALCITON, LATICACIDVEN in the last 168 hours.  No results found for this or any previous visit (from the past 240 hours).   Radiology Studies: No results found.  Scheduled Meds:   stroke: early stages of recovery book   Does not apply Once   amLODipine   5 mg Oral Daily   atorvastatin   40 mg Oral q1800   clopidogrel   75 mg Oral Daily   docusate sodium   100 mg Oral BID   famotidine   20 mg Oral BID   multivitamin with minerals  1 tablet Oral Daily   polyethylene glycol  17 g Oral Daily   senna  2 tablet Oral QHS   sodium chloride  flush  3 mL Intravenous Once   thiamine   100 mg  Oral Daily   Continuous Infusions:   LOS: 28 days    Time spent: 50 Mins    Darcel Dawley, MD [facility]   If 7PM-7AM, please contact night-coverage  "

## 2024-10-05 NOTE — TOC Progression Note (Signed)
 Transition of Care [facility]) - Progression Note    Patient Details  Name: Matthew Roman MRN: [id number] Date of Birth: 02/27/1956  Transition of Care [facility]) CM/SW Contact  [name], [place] Phone Number: 10/05/2024, 4:40 PM  Clinical Narrative:     CSW submitted report to [facility] DSS [id number]). CSW will receive letter with update from APS. CSW also left phone number to call.  CSW will continue to work on DC plan.   Expected Discharge Plan: Skilled Nursing Facility Barriers to Discharge: Insurance Authorization               Expected Discharge Plan and Services       Living arrangements for the past 2 months: Single Family Home                                       Social Drivers of Health (SDOH) Interventions SDOH Screenings   Food Insecurity: Patient Unable To Answer (09/03/2024)  Housing: Patient Unable To Answer (09/03/2024)  Transportation Needs: Patient Unable To Answer (09/03/2024)  Utilities: Patient Unable To Answer (09/03/2024)  Social Connections: Patient Unable To Answer (09/03/2024)  Tobacco Use: High Risk (09/03/2024)    Readmission Risk Interventions     No data to display

## 2024-10-05 NOTE — Plan of Care (Signed)
 " Problem: Education: Goal: Knowledge of disease or condition will improve 10/05/2024 0610 by Honora Frederick PARAS, RN Outcome: Progressing 10/04/2024 2238 by Honora Frederick PARAS, RN Outcome: Progressing Goal: Knowledge of secondary prevention will improve (MUST DOCUMENT ALL) 10/05/2024 0610 by Honora Frederick PARAS, RN Outcome: Progressing 10/04/2024 2238 by Honora Frederick PARAS, RN Outcome: Progressing Goal: Knowledge of patient specific risk factors will improve (DELETE if not current risk factor) 10/05/2024 0610 by Honora Frederick PARAS, RN Outcome: Progressing 10/04/2024 2238 by Honora Frederick PARAS, RN Outcome: Progressing   Problem: Coping: Goal: Will verbalize positive feelings about self 10/05/2024 0610 by Honora Frederick PARAS, RN Outcome: Progressing 10/04/2024 2238 by Honora Frederick PARAS, RN Outcome: Progressing Goal: Will identify appropriate support needs 10/05/2024 0610 by Honora Frederick PARAS, RN Outcome: Progressing 10/04/2024 2238 by Honora Frederick PARAS, RN Outcome: Progressing   Problem: Health Behavior/Discharge Planning: Goal: Ability to manage health-related needs will improve 10/05/2024 0610 by Honora Frederick PARAS, RN Outcome: Progressing 10/04/2024 2238 by Honora Frederick PARAS, RN Outcome: Progressing Goal: Goals will be collaboratively established with patient/family 10/05/2024 0610 by Honora Frederick PARAS, RN Outcome: Progressing 10/04/2024 2238 by Honora Frederick PARAS, RN Outcome: Progressing   Problem: Self-Care: Goal: Ability to participate in self-care as condition permits will improve 10/05/2024 0610 by Honora Frederick PARAS, RN Outcome: Progressing 10/04/2024 2238 by Honora Frederick PARAS, RN Outcome: Progressing Goal: Verbalization of feelings and concerns over difficulty with self-care will improve 10/05/2024 0610 by Honora Frederick PARAS, RN Outcome: Progressing 10/04/2024 2238 by Honora Frederick PARAS, RN Outcome: Progressing Goal: Ability to communicate needs accurately will  improve 10/05/2024 0610 by Honora Frederick PARAS, RN Outcome: Progressing 10/04/2024 2238 by Honora Frederick PARAS, RN Outcome: Progressing   Problem: Nutrition: Goal: Dietary intake will improve 10/05/2024 0610 by Honora Frederick PARAS, RN Outcome: Progressing 10/04/2024 2238 by Honora Frederick PARAS, RN Outcome: Progressing   Problem: Education: Goal: Ability to describe self-care measures that may prevent or decrease complications (Diabetes Survival Skills Education) will improve 10/05/2024 0610 by Honora Frederick PARAS, RN Outcome: Progressing 10/04/2024 2238 by Honora Frederick PARAS, RN Outcome: Progressing Goal: Individualized Educational Video(s) 10/05/2024 0610 by Honora Frederick PARAS, RN Outcome: Progressing 10/04/2024 2238 by Honora Frederick PARAS, RN Outcome: Progressing   Problem: Coping: Goal: Ability to adjust to condition or change in health will improve 10/05/2024 0610 by Honora Frederick PARAS, RN Outcome: Progressing 10/04/2024 2238 by Honora Frederick PARAS, RN Outcome: Progressing   Problem: Health Behavior/Discharge Planning: Goal: Ability to manage health-related needs will improve 10/05/2024 0610 by Honora Frederick PARAS, RN Outcome: Progressing 10/04/2024 2238 by Honora Frederick PARAS, RN Outcome: Progressing   Problem: Nutritional: Goal: Maintenance of adequate nutrition will improve 10/05/2024 0610 by Honora Frederick PARAS, RN Outcome: Progressing 10/04/2024 2238 by Honora Frederick PARAS, RN Outcome: Progressing Goal: Progress toward achieving an optimal weight will improve 10/05/2024 0610 by Honora Frederick PARAS, RN Outcome: Progressing 10/04/2024 2238 by Honora Frederick PARAS, RN Outcome: Progressing   Problem: Skin Integrity: Goal: Risk for impaired skin integrity will decrease 10/05/2024 0610 by Honora Frederick PARAS, RN Outcome: Progressing 10/04/2024 2238 by Honora Frederick PARAS, RN Outcome: Progressing   Problem: Education: Goal: Knowledge of General Education information will  improve Description: Including pain rating scale, medication(s)/side effects and non-pharmacologic comfort measures 10/05/2024 0610 by Honora Frederick PARAS, RN Outcome: Progressing 10/04/2024 2238 by Honora Frederick PARAS, RN Outcome: Progressing   Problem: Health Behavior/Discharge Planning: Goal: Ability to manage health-related needs will improve 10/05/2024 0610 by Honora Frederick PARAS, RN Outcome: Progressing 10/04/2024 2238  by Honora Frederick PARAS, RN Outcome: Progressing   Problem: Clinical Measurements: Goal: Ability to maintain clinical measurements within normal limits will improve 10/05/2024 0610 by Honora Frederick PARAS, RN Outcome: Progressing 10/04/2024 2238 by Honora Frederick PARAS, RN Outcome: Progressing Goal: Will remain free from infection 10/05/2024 0610 by Honora Frederick PARAS, RN Outcome: Progressing 10/04/2024 2238 by Honora Frederick PARAS, RN Outcome: Progressing Goal: Diagnostic test results will improve 10/05/2024 0610 by Honora Frederick PARAS, RN Outcome: Progressing 10/04/2024 2238 by Honora Frederick PARAS, RN Outcome: Progressing   Problem: Activity: Goal: Risk for activity intolerance will decrease 10/05/2024 0610 by Honora Frederick PARAS, RN Outcome: Progressing 10/04/2024 2238 by Honora Frederick PARAS, RN Outcome: Progressing   Problem: Nutrition: Goal: Adequate nutrition will be maintained 10/05/2024 0610 by Honora Frederick PARAS, RN Outcome: Progressing 10/04/2024 2238 by Honora Frederick PARAS, RN Outcome: Progressing   Problem: Coping: Goal: Level of anxiety will decrease 10/05/2024 0610 by Honora Frederick PARAS, RN Outcome: Progressing 10/04/2024 2238 by Honora Frederick PARAS, RN Outcome: Progressing   Problem: Elimination: Goal: Will not experience complications related to bowel motility 10/05/2024 0610 by Honora Frederick PARAS, RN Outcome: Progressing 10/04/2024 2238 by Honora Frederick PARAS, RN Outcome: Progressing Goal: Will not experience complications related to urinary  retention 10/05/2024 0610 by Honora Frederick PARAS, RN Outcome: Progressing 10/04/2024 2238 by Honora Frederick PARAS, RN Outcome: Progressing   Problem: Pain Managment: Goal: General experience of comfort will improve and/or be controlled 10/05/2024 0610 by Honora Frederick PARAS, RN Outcome: Progressing 10/04/2024 2238 by Honora Frederick PARAS, RN Outcome: Progressing   Problem: Safety: Goal: Ability to remain free from injury will improve 10/05/2024 0610 by Honora Frederick PARAS, RN Outcome: Progressing 10/04/2024 2238 by Honora Frederick PARAS, RN Outcome: Progressing   Problem: Skin Integrity: Goal: Risk for impaired skin integrity will decrease 10/05/2024 0610 by Honora Frederick PARAS, RN Outcome: Progressing 10/04/2024 2238 by Honora Frederick PARAS, RN Outcome: Progressing   Problem: Education: Goal: Knowledge of disease or condition will improve 10/05/2024 0610 by Honora Frederick PARAS, RN Outcome: Progressing 10/04/2024 2238 by Honora Frederick PARAS, RN Outcome: Progressing Goal: Knowledge of secondary prevention will improve (MUST DOCUMENT ALL) 10/05/2024 0610 by Honora Frederick PARAS, RN Outcome: Progressing 10/04/2024 2238 by Honora Frederick PARAS, RN Outcome: Progressing Goal: Knowledge of patient specific risk factors will improve (DELETE if not current risk factor) 10/05/2024 0610 by Honora Frederick PARAS, RN Outcome: Progressing 10/04/2024 2238 by Honora Frederick PARAS, RN Outcome: Progressing   Problem: Coping: Goal: Will verbalize positive feelings about self 10/05/2024 0610 by Honora Frederick PARAS, RN Outcome: Progressing 10/04/2024 2238 by Honora Frederick PARAS, RN Outcome: Progressing Goal: Will identify appropriate support needs 10/05/2024 0610 by Honora Frederick PARAS, RN Outcome: Progressing 10/04/2024 2238 by Honora Frederick PARAS, RN Outcome: Progressing   Problem: Health Behavior/Discharge Planning: Goal: Ability to manage health-related needs will improve 10/05/2024 0610 by Honora Frederick PARAS,  RN Outcome: Progressing 10/04/2024 2238 by Honora Frederick PARAS, RN Outcome: Progressing Goal: Goals will be collaboratively established with patient/family 10/05/2024 0610 by Honora Frederick PARAS, RN Outcome: Progressing 10/04/2024 2238 by Honora Frederick PARAS, RN Outcome: Progressing   Problem: Self-Care: Goal: Ability to participate in self-care as condition permits will improve 10/05/2024 0610 by Honora Frederick PARAS, RN Outcome: Progressing 10/04/2024 2238 by Honora Frederick PARAS, RN Outcome: Progressing Goal: Verbalization of feelings and concerns over difficulty with self-care will improve 10/05/2024 0610 by Honora Frederick PARAS, RN Outcome: Progressing 10/04/2024 2238 by Honora Frederick PARAS, RN Outcome: Progressing Goal: Ability to communicate needs accurately  will improve 10/05/2024 0610 by Honora Frederick PARAS, RN Outcome: Progressing 10/04/2024 2238 by Honora Frederick PARAS, RN Outcome: Progressing   Problem: Nutrition: Goal: Dietary intake will improve 10/05/2024 0610 by Honora Frederick PARAS, RN Outcome: Progressing 10/04/2024 2238 by Honora Frederick PARAS, RN Outcome: Progressing   "

## 2024-10-06 DIAGNOSIS — I639 Cerebral infarction, unspecified: Secondary | ICD-10-CM | POA: Diagnosis not present

## 2024-10-06 NOTE — Plan of Care (Signed)
" °  Problem: Education: Goal: Knowledge of disease or condition will improve Outcome: Progressing Goal: Knowledge of secondary prevention will improve (MUST DOCUMENT ALL) Outcome: Progressing Goal: Knowledge of patient specific risk factors will improve (DELETE if not current risk factor) Outcome: Progressing   Problem: Coping: Goal: Will verbalize positive feelings about self Outcome: Progressing Goal: Will identify appropriate support needs Outcome: Progressing   Problem: Health Behavior/Discharge Planning: Goal: Ability to manage health-related needs will improve Outcome: Progressing Goal: Goals will be collaboratively established with patient/family Outcome: Progressing   Problem: Self-Care: Goal: Ability to participate in self-care as condition permits will improve Outcome: Progressing Goal: Verbalization of feelings and concerns over difficulty with self-care will improve Outcome: Progressing Goal: Ability to communicate needs accurately will improve Outcome: Progressing   Problem: Nutrition: Goal: Dietary intake will improve Outcome: Progressing   Problem: Education: Goal: Ability to describe self-care measures that may prevent or decrease complications (Diabetes Survival Skills Education) will improve Outcome: Progressing Goal: Individualized Educational Video(s) Outcome: Progressing   Problem: Coping: Goal: Ability to adjust to condition or change in health will improve Outcome: Progressing   Problem: Health Behavior/Discharge Planning: Goal: Ability to manage health-related needs will improve Outcome: Progressing   Problem: Nutritional: Goal: Maintenance of adequate nutrition will improve Outcome: Progressing Goal: Progress toward achieving an optimal weight will improve Outcome: Progressing   Problem: Skin Integrity: Goal: Risk for impaired skin integrity will decrease Outcome: Progressing   Problem: Education: Goal: Knowledge of General Education  information will improve Description: Including pain rating scale, medication(s)/side effects and non-pharmacologic comfort measures Outcome: Progressing   Problem: Health Behavior/Discharge Planning: Goal: Ability to manage health-related needs will improve Outcome: Progressing   Problem: Clinical Measurements: Goal: Ability to maintain clinical measurements within normal limits will improve Outcome: Progressing Goal: Will remain free from infection Outcome: Progressing Goal: Diagnostic test results will improve Outcome: Progressing   Problem: Activity: Goal: Risk for activity intolerance will decrease Outcome: Progressing   Problem: Nutrition: Goal: Adequate nutrition will be maintained Outcome: Progressing   Problem: Coping: Goal: Level of anxiety will decrease Outcome: Progressing   Problem: Elimination: Goal: Will not experience complications related to bowel motility Outcome: Progressing Goal: Will not experience complications related to urinary retention Outcome: Progressing   Problem: Pain Managment: Goal: General experience of comfort will improve and/or be controlled Outcome: Progressing   Problem: Safety: Goal: Ability to remain free from injury will improve Outcome: Progressing   Problem: Skin Integrity: Goal: Risk for impaired skin integrity will decrease Outcome: Progressing   Problem: Education: Goal: Knowledge of disease or condition will improve Outcome: Progressing Goal: Knowledge of secondary prevention will improve (MUST DOCUMENT ALL) Outcome: Progressing Goal: Knowledge of patient specific risk factors will improve (DELETE if not current risk factor) Outcome: Progressing   Problem: Coping: Goal: Will verbalize positive feelings about self Outcome: Progressing Goal: Will identify appropriate support needs Outcome: Progressing   Problem: Health Behavior/Discharge Planning: Goal: Ability to manage health-related needs will  improve Outcome: Progressing Goal: Goals will be collaboratively established with patient/family Outcome: Progressing   Problem: Self-Care: Goal: Ability to participate in self-care as condition permits will improve Outcome: Progressing Goal: Verbalization of feelings and concerns over difficulty with self-care will improve Outcome: Progressing Goal: Ability to communicate needs accurately will improve Outcome: Progressing   Problem: Nutrition: Goal: Dietary intake will improve Outcome: Progressing   "

## 2024-10-06 NOTE — Progress Notes (Signed)
 " PROGRESS NOTE    Matthew Roman  FMW:[id number] DOB: Feb 01, 1956 DOA: 09/02/2024  [facility]P: [facility]   Brief Narrative:  This 69 year old Male with past medical history significant for dementia, TIA, cirrhosis, diabetes type 2, hyperlipidemia, hepatic encephalopathy, tobacco use, sleep apnea. Patient presents secondary to confusion and left-sided neglect found to have evidence of an acute stroke. Neurology consulted. Patient started on dual antiplatelet therapy with aspirin  and Plavix . Currently awaiting placement.   Assessment & Plan:   Principal Problem:   Acute stroke due to ischemia [facility]) Active Problems:   Hyperlipidemia   Vascular dementia, unspecified severity, without behavioral disturbance, psychotic disturbance, mood disturbance, and anxiety (HCC)   AMS (altered mental status)   HTN (hypertension)   Malnutrition of moderate degree   Acute stroke: Patient presented with confusion.  Initial CT head 12/12 > No significant acute intracranial infarct or hemorrhage. MRI brain 12/12 confirmed small acute versus early subacute infarct in the posterior right frontal lobe,  subcortical white matter. CTA head and neck >No large vessel occlusion. LDL 58, HbA1c 5.5. 2D echo left ventricular ejection fraction 65%,  No atrial level shunt. Neurology recommended Plavix  indefinitely and 3 weeks of aspirin  which he completed course. PT/ OT recommended inpatient rehab.  Insurance denied, P2P denied SNF Continue Plavix  Does not have VA benefit for rehab.    Acute metabolic encephalopathy: Present on admission presumably secondary to acute stroke complicated by underlying dementia. EEG suggested generalized cerebral dysfunction without seizure. Ammonia Level  26 > WNL He is back to baseline mental status.   Vascular dementia: Cognitive impairment: - Noted - B12 1200, TSH 1.004, HIV nonreactive. -Thiamine  level 94 -Continue Thiamine  supplement.    Hypertension: -Continue  amlodipine .   Diabetes type 2 HbA1c 5.5 Well controlled.    Hyperlipidemia -Continue Lipitor.   Tobacco abuse Counseled.   Moderate malnutrition Continue supplements.   Thrombocytopenia: Monitor. Improving.    DVT prophylaxis: SCDs Code Status: Full code Family Communication:No family at bed side Disposition Plan:   Status is: Inpatient Remains inpatient appropriate because: Difficult disposition.  TOC is aware   Consultants:  Neurology  Procedures: Echo   Antimicrobials:  Anti-infectives (From admission, onward)    None      Subjective: Patient was seen and examined at bedside.  Overnight events noted. Patient was sitting comfortably on the chair, Denies any concerns.  Objective: Vitals:   10/05/24 0508 10/05/24 0809 10/05/24 1143 10/05/24 2001  BP: 116/60 (!) 171/82 135/68 (!) 141/61  Pulse: (!) 54 61 62 71  Resp: 18 15 16    Temp: 98.6 F (37 C) 98 F (36.7 C) 98.2 F (36.8 C)   TempSrc: Oral     SpO2: 97% 100% 96% 99%  Weight:      Height:       No intake or output data in the 24 hours ending 10/06/24 1044 Filed Weights   09/02/24 1440  Weight: 80.2 kg    Examination:  General exam: Appears calm and comfortable, not in any acute distress. Respiratory system: CTA Bilaterally. Respiratory effort normal.  RR 14 Cardiovascular system: S1 & S2 heard, RRR. No JVD, murmurs, rubs, gallops or clicks.  Gastrointestinal system: Abdomen is non distended, soft and non tender. . Normal bowel sounds heard. Central nervous system: Alert and oriented X 3. No focal neurological deficits. Extremities: No Edema, no cyanosis, no clubbing. Skin: No rashes, lesions or ulcers Psychiatry: Judgement and insight appear normal. Mood & affect appropriate.     Data Reviewed:  I have personally reviewed following labs and imaging studies  CBC: No results for input(s): WBC, NEUTROABS, HGB, HCT, MCV, PLT in the last 168 hours. Basic Metabolic Panel: No  results for input(s): NA, K, CL, CO2, GLUCOSE, BUN, CREATININE, CALCIUM , MG, PHOS in the last 168 hours. GFR: Estimated Creatinine Clearance: 82.3 mL/min (by C-G formula based on SCr of 0.93 mg/dL). Liver Function Tests: No results for input(s): AST, ALT, ALKPHOS, BILITOT, PROT, ALBUMIN in the last 168 hours. No results for input(s): LIPASE, AMYLASE in the last 168 hours. No results for input(s): AMMONIA in the last 168 hours. Coagulation Profile: No results for input(s): INR, PROTIME in the last 168 hours. Cardiac Enzymes: No results for input(s): CKTOTAL, CKMB, CKMBINDEX, TROPONINI in the last 168 hours. BNP (last 3 results) No results for input(s): PROBNP in the last 8760 hours. HbA1C: No results for input(s): HGBA1C in the last 72 hours. CBG: Recent Labs  Lab 10/03/24 2045  GLUCAP 151*   Lipid Profile: No results for input(s): CHOL, HDL, LDLCALC, TRIG, CHOLHDL, LDLDIRECT in the last 72 hours. Thyroid Function Tests: No results for input(s): TSH, T4TOTAL, FREET4, T3FREE, THYROIDAB in the last 72 hours. Anemia Panel: No results for input(s): VITAMINB12, FOLATE, FERRITIN, TIBC, IRON, RETICCTPCT in the last 72 hours. Sepsis Labs: No results for input(s): PROCALCITON, LATICACIDVEN in the last 168 hours.  No results found for this or any previous visit (from the past 240 hours).   Radiology Studies: No results found.  Scheduled Meds:   stroke: early stages of recovery book   Does not apply Once   amLODipine   5 mg Oral Daily   atorvastatin   40 mg Oral q1800   clopidogrel   75 mg Oral Daily   docusate sodium   100 mg Oral BID   famotidine   20 mg Oral BID   multivitamin with minerals  1 tablet Oral Daily   polyethylene glycol  17 g Oral Daily   senna  2 tablet Oral QHS   sodium chloride  flush  3 mL Intravenous Once   thiamine   100 mg Oral Daily   Continuous Infusions:   LOS: 29 days     Time spent: 35 Mins    Darcel Dawley, MD [facility]   If 7PM-7AM, please contact night-coverage  "

## 2024-10-06 NOTE — Plan of Care (Signed)
  Problem: Education: Goal: Knowledge of disease or condition will improve Outcome: Adequate for Discharge Goal: Knowledge of secondary prevention will improve (MUST DOCUMENT ALL) Outcome: Adequate for Discharge Goal: Knowledge of patient specific risk factors will improve (DELETE if not current risk factor) Outcome: Adequate for Discharge

## 2024-10-07 DIAGNOSIS — I639 Cerebral infarction, unspecified: Secondary | ICD-10-CM | POA: Diagnosis not present

## 2024-10-07 NOTE — Progress Notes (Signed)
 " PROGRESS NOTE    Matthew Roman  FMW:[id number] DOB: Jun 15, 1956 DOA: 09/02/2024  PCP: [facility]   Brief Narrative:  This 69 year old Male with past medical history significant for dementia, TIA, cirrhosis, diabetes type 2, hyperlipidemia, hepatic encephalopathy, tobacco use, sleep apnea. Patient presents secondary to confusion and left-sided neglect found to have evidence of an acute stroke. Neurology consulted. Patient started on dual antiplatelet therapy with aspirin  and Plavix . Currently awaiting placement.   Assessment & Plan:   Principal Problem:   Acute stroke due to ischemia [facility]) Active Problems:   Hyperlipidemia   Vascular dementia, unspecified severity, without behavioral disturbance, psychotic disturbance, mood disturbance, and anxiety (HCC)   AMS (altered mental status)   HTN (hypertension)   Malnutrition of moderate degree   Acute stroke: Patient presented with confusion.  Initial CT head 12/12 > No significant acute intracranial infarct or hemorrhage. MRI brain 12/12 confirmed small acute versus early subacute infarct in the posterior right frontal lobe,  subcortical white matter. CTA head and neck >No large vessel occlusion. LDL 58, HbA1c 5.5. 2D echo left ventricular ejection fraction 65%,  No atrial level shunt. Neurology recommended Plavix  indefinitely and 3 weeks of aspirin  which he completed course. PT/ OT recommended inpatient rehab.  Insurance denied, P2P denied SNF Continue Plavix  Does not have VA benefit for rehab.    Acute metabolic encephalopathy: Present on admission presumably secondary to acute stroke complicated by underlying dementia. EEG suggested generalized cerebral dysfunction without seizure. Ammonia Level  26 > WNL He is back to baseline mental status.   Vascular dementia: Cognitive impairment: - Noted - B12 1200, TSH 1.004, HIV nonreactive. -Thiamine  level 94 -Continue Thiamine  supplement.    Hypertension: -Continue  amlodipine .   Diabetes type 2 HbA1c 5.5 Well controlled.    Hyperlipidemia -Continue Lipitor.   Tobacco abuse Counseled.   Moderate malnutrition Continue supplements.   Thrombocytopenia: Monitor. Improving.    DVT prophylaxis: SCDs Code Status: Full code Family Communication: No family at bed side Disposition Plan:   Status is: Inpatient Remains inpatient appropriate because: Difficult disposition.  TOC is aware   Consultants:  Neurology  Procedures: Echo   Antimicrobials:  Anti-infectives (From admission, onward)    None      Subjective: Patient was seen and examined at bedside.  Overnight events noted. Patient was sitting comfortably on the chair, Denies any concerns. He seems frustrated with the long wait for discharge.  Objective: Vitals:   10/06/24 2033 10/07/24 0035 10/07/24 0404 10/07/24 0822  BP: 133/78 (!) 148/88 133/72 128/76  Pulse: 61 78 60 (!) 59  Resp: 17 18 18    Temp: 98.7 F (37.1 C) 97.8 F (36.6 C) 97.9 F (36.6 C) (!) 97.5 F (36.4 C)  TempSrc: Oral Oral Oral   SpO2: 98% 98% 96% 96%  Weight:      Height:       No intake or output data in the 24 hours ending 10/07/24 1111 Filed Weights   09/02/24 1440  Weight: 80.2 kg    Examination:  General exam: Appears calm and comfortable, not in any acute distress. Respiratory system: CTA Bilaterally. Respiratory effort normal.  RR 13 Cardiovascular system: S1 & S2 heard, RRR. No JVD, murmurs, rubs, gallops or clicks.  Gastrointestinal system: Abdomen is non distended, soft and non tender. . Normal bowel sounds heard. Central nervous system: Alert and oriented X 3. No focal neurological deficits. Extremities: No Edema, no cyanosis, no clubbing. Skin: No rashes, lesions or ulcers Psychiatry: Judgement and  insight appear normal. Mood & affect appropriate.     Data Reviewed: I have personally reviewed following labs and imaging studies  CBC: No results for input(s): WBC,  NEUTROABS, HGB, HCT, MCV, PLT in the last 168 hours. Basic Metabolic Panel: No results for input(s): NA, K, CL, CO2, GLUCOSE, BUN, CREATININE, CALCIUM , MG, PHOS in the last 168 hours. GFR: Estimated Creatinine Clearance: 82.3 mL/min (by C-G formula based on SCr of 0.93 mg/dL). Liver Function Tests: No results for input(s): AST, ALT, ALKPHOS, BILITOT, PROT, ALBUMIN in the last 168 hours. No results for input(s): LIPASE, AMYLASE in the last 168 hours. No results for input(s): AMMONIA in the last 168 hours. Coagulation Profile: No results for input(s): INR, PROTIME in the last 168 hours. Cardiac Enzymes: No results for input(s): CKTOTAL, CKMB, CKMBINDEX, TROPONINI in the last 168 hours. BNP (last 3 results) No results for input(s): PROBNP in the last 8760 hours. HbA1C: No results for input(s): HGBA1C in the last 72 hours. CBG: Recent Labs  Lab 10/03/24 2045  GLUCAP 151*   Lipid Profile: No results for input(s): CHOL, HDL, LDLCALC, TRIG, CHOLHDL, LDLDIRECT in the last 72 hours. Thyroid Function Tests: No results for input(s): TSH, T4TOTAL, FREET4, T3FREE, THYROIDAB in the last 72 hours. Anemia Panel: No results for input(s): VITAMINB12, FOLATE, FERRITIN, TIBC, IRON, RETICCTPCT in the last 72 hours. Sepsis Labs: No results for input(s): PROCALCITON, LATICACIDVEN in the last 168 hours.  No results found for this or any previous visit (from the past 240 hours).   Radiology Studies: No results found.  Scheduled Meds:   stroke: early stages of recovery book   Does not apply Once   amLODipine   5 mg Oral Daily   atorvastatin   40 mg Oral q1800   clopidogrel   75 mg Oral Daily   docusate sodium   100 mg Oral BID   famotidine   20 mg Oral BID   multivitamin with minerals  1 tablet Oral Daily   polyethylene glycol  17 g Oral Daily   senna  2 tablet Oral QHS   sodium chloride  flush  3 mL  Intravenous Once   thiamine   100 mg Oral Daily   Continuous Infusions:   LOS: 30 days    Time spent: 35 Mins    Darcel Dawley, MD [facility]   If 7PM-7AM, please contact night-coverage  "

## 2024-10-07 NOTE — Plan of Care (Signed)
" °  Problem: Education: Goal: Knowledge of disease or condition will improve Outcome: Progressing Goal: Knowledge of secondary prevention will improve (MUST DOCUMENT ALL) Outcome: Progressing Goal: Knowledge of patient specific risk factors will improve (DELETE if not current risk factor) Outcome: Progressing   Problem: Coping: Goal: Will verbalize positive feelings about self Outcome: Progressing Goal: Will identify appropriate support needs Outcome: Progressing   Problem: Health Behavior/Discharge Planning: Goal: Ability to manage health-related needs will improve Outcome: Progressing Goal: Goals will be collaboratively established with patient/family Outcome: Progressing   Problem: Self-Care: Goal: Ability to participate in self-care as condition permits will improve Outcome: Progressing Goal: Verbalization of feelings and concerns over difficulty with self-care will improve Outcome: Progressing Goal: Ability to communicate needs accurately will improve Outcome: Progressing   Problem: Nutrition: Goal: Dietary intake will improve Outcome: Progressing   Problem: Education: Goal: Ability to describe self-care measures that may prevent or decrease complications (Diabetes Survival Skills Education) will improve Outcome: Progressing Goal: Individualized Educational Video(s) Outcome: Progressing   Problem: Coping: Goal: Ability to adjust to condition or change in health will improve Outcome: Progressing   Problem: Health Behavior/Discharge Planning: Goal: Ability to manage health-related needs will improve Outcome: Progressing   Problem: Nutritional: Goal: Maintenance of adequate nutrition will improve Outcome: Progressing Goal: Progress toward achieving an optimal weight will improve Outcome: Progressing   Problem: Skin Integrity: Goal: Risk for impaired skin integrity will decrease Outcome: Progressing   Problem: Education: Goal: Knowledge of General Education  information will improve Description: Including pain rating scale, medication(s)/side effects and non-pharmacologic comfort measures Outcome: Progressing   Problem: Health Behavior/Discharge Planning: Goal: Ability to manage health-related needs will improve Outcome: Progressing   Problem: Clinical Measurements: Goal: Ability to maintain clinical measurements within normal limits will improve Outcome: Progressing Goal: Will remain free from infection Outcome: Progressing Goal: Diagnostic test results will improve Outcome: Progressing   Problem: Activity: Goal: Risk for activity intolerance will decrease Outcome: Progressing   Problem: Nutrition: Goal: Adequate nutrition will be maintained Outcome: Progressing   Problem: Coping: Goal: Level of anxiety will decrease Outcome: Progressing   Problem: Elimination: Goal: Will not experience complications related to bowel motility Outcome: Progressing Goal: Will not experience complications related to urinary retention Outcome: Progressing   Problem: Pain Managment: Goal: General experience of comfort will improve and/or be controlled Outcome: Progressing   Problem: Safety: Goal: Ability to remain free from injury will improve Outcome: Progressing   Problem: Skin Integrity: Goal: Risk for impaired skin integrity will decrease Outcome: Progressing   Problem: Education: Goal: Knowledge of disease or condition will improve Outcome: Progressing Goal: Knowledge of secondary prevention will improve (MUST DOCUMENT ALL) Outcome: Progressing Goal: Knowledge of patient specific risk factors will improve (DELETE if not current risk factor) Outcome: Progressing   Problem: Coping: Goal: Will verbalize positive feelings about self Outcome: Progressing Goal: Will identify appropriate support needs Outcome: Progressing   Problem: Health Behavior/Discharge Planning: Goal: Ability to manage health-related needs will  improve Outcome: Progressing Goal: Goals will be collaboratively established with patient/family Outcome: Progressing   Problem: Self-Care: Goal: Ability to participate in self-care as condition permits will improve Outcome: Progressing Goal: Verbalization of feelings and concerns over difficulty with self-care will improve Outcome: Progressing Goal: Ability to communicate needs accurately will improve Outcome: Progressing   Problem: Nutrition: Goal: Dietary intake will improve Outcome: Progressing   "

## 2024-10-08 LAB — PHOSPHORUS: Phosphorus: 2.9 mg/dL (ref 2.5–4.6)

## 2024-10-08 LAB — BASIC METABOLIC PANEL WITH GFR
Anion gap: 10 (ref 5–15)
BUN: 20 mg/dL (ref 8–23)
CO2: 24 mmol/L (ref 22–32)
Calcium: 9.1 mg/dL (ref 8.9–10.3)
Chloride: 107 mmol/L (ref 98–111)
Creatinine, Ser: 0.9 mg/dL (ref 0.61–1.24)
GFR, Estimated: >60 mL/min
Glucose, Bld: 136 mg/dL — ABNORMAL HIGH (ref 70–99)
Potassium: 4.3 mmol/L (ref 3.5–5.1)
Sodium: 141 mmol/L (ref 135–145)

## 2024-10-08 LAB — MAGNESIUM: Magnesium: 2.1 mg/dL (ref 1.7–2.4)

## 2024-10-08 LAB — CBC
HCT: 39 % (ref 39.0–52.0)
Hemoglobin: 13.4 g/dL (ref 13.0–17.0)
MCH: 30.5 pg (ref 26.0–34.0)
MCHC: 34.4 g/dL (ref 30.0–36.0)
MCV: 88.8 fL (ref 80.0–100.0)
Platelets: 92 K/uL — ABNORMAL LOW (ref 150–400)
RBC: 4.39 MIL/uL (ref 4.22–5.81)
RDW: 14.6 % (ref 11.5–15.5)
WBC: 4.3 K/uL (ref 4.0–10.5)
nRBC: 0 % (ref 0.0–0.2)

## 2024-10-08 NOTE — Plan of Care (Signed)
" °  Problem: Education: Goal: Knowledge of disease or condition will improve Outcome: Progressing Goal: Knowledge of secondary prevention will improve (MUST DOCUMENT ALL) Outcome: Progressing Goal: Knowledge of patient specific risk factors will improve (DELETE if not current risk factor) Outcome: Progressing   Problem: Coping: Goal: Will verbalize positive feelings about self Outcome: Progressing Goal: Will identify appropriate support needs Outcome: Progressing   Problem: Health Behavior/Discharge Planning: Goal: Ability to manage health-related needs will improve Outcome: Progressing Goal: Goals will be collaboratively established with patient/family Outcome: Progressing   Problem: Self-Care: Goal: Ability to participate in self-care as condition permits will improve Outcome: Progressing Goal: Verbalization of feelings and concerns over difficulty with self-care will improve Outcome: Progressing Goal: Ability to communicate needs accurately will improve Outcome: Progressing   Problem: Nutrition: Goal: Dietary intake will improve Outcome: Progressing   Problem: Education: Goal: Ability to describe self-care measures that may prevent or decrease complications (Diabetes Survival Skills Education) will improve Outcome: Progressing Goal: Individualized Educational Video(s) Outcome: Progressing   Problem: Coping: Goal: Ability to adjust to condition or change in health will improve Outcome: Progressing   Problem: Health Behavior/Discharge Planning: Goal: Ability to manage health-related needs will improve Outcome: Progressing   Problem: Nutritional: Goal: Maintenance of adequate nutrition will improve Outcome: Progressing Goal: Progress toward achieving an optimal weight will improve Outcome: Progressing   Problem: Skin Integrity: Goal: Risk for impaired skin integrity will decrease Outcome: Progressing   Problem: Education: Goal: Knowledge of General Education  information will improve Description: Including pain rating scale, medication(s)/side effects and non-pharmacologic comfort measures Outcome: Progressing   Problem: Health Behavior/Discharge Planning: Goal: Ability to manage health-related needs will improve Outcome: Progressing   Problem: Clinical Measurements: Goal: Ability to maintain clinical measurements within normal limits will improve Outcome: Progressing Goal: Will remain free from infection Outcome: Progressing Goal: Diagnostic test results will improve Outcome: Progressing   Problem: Activity: Goal: Risk for activity intolerance will decrease Outcome: Progressing   Problem: Nutrition: Goal: Adequate nutrition will be maintained Outcome: Progressing   Problem: Coping: Goal: Level of anxiety will decrease Outcome: Progressing   Problem: Elimination: Goal: Will not experience complications related to bowel motility Outcome: Progressing Goal: Will not experience complications related to urinary retention Outcome: Progressing   Problem: Pain Managment: Goal: General experience of comfort will improve and/or be controlled Outcome: Progressing   Problem: Safety: Goal: Ability to remain free from injury will improve Outcome: Progressing   Problem: Skin Integrity: Goal: Risk for impaired skin integrity will decrease Outcome: Progressing   Problem: Education: Goal: Knowledge of disease or condition will improve Outcome: Progressing Goal: Knowledge of secondary prevention will improve (MUST DOCUMENT ALL) Outcome: Progressing Goal: Knowledge of patient specific risk factors will improve (DELETE if not current risk factor) Outcome: Progressing   Problem: Coping: Goal: Will verbalize positive feelings about self Outcome: Progressing Goal: Will identify appropriate support needs Outcome: Progressing   Problem: Health Behavior/Discharge Planning: Goal: Ability to manage health-related needs will  improve Outcome: Progressing Goal: Goals will be collaboratively established with patient/family Outcome: Progressing   Problem: Self-Care: Goal: Ability to participate in self-care as condition permits will improve Outcome: Progressing Goal: Verbalization of feelings and concerns over difficulty with self-care will improve Outcome: Progressing Goal: Ability to communicate needs accurately will improve Outcome: Progressing   Problem: Nutrition: Goal: Dietary intake will improve Outcome: Progressing   "

## 2024-10-08 NOTE — Progress Notes (Signed)
 " PROGRESS NOTE    Matthew Roman  FMW:[id number] DOB: 1956-08-30 DOA: 09/02/2024  [facility]P: [facility]   Brief Narrative:  Matthew Roman is a 69 y o man with past medical history significant for dementia, TIA, cirrhosis, diabetes type 2, hyperlipidemia, hepatic encephalopathy, tobacco use, sleep apnea. Patient presented secondary to confusion and left-sided neglect found to have evidence of an acute stroke. Neurology consulted. Patient started on dual antiplatelet therapy with aspirin  and Plavix . Currently awaiting placement.   Assessment & Plan:   Principal Problem:   Acute stroke due to ischemia [facility]) Active Problems:   Hyperlipidemia   Vascular dementia, unspecified severity, without behavioral disturbance, psychotic disturbance, mood disturbance, and anxiety (HCC)   AMS (altered mental status)   HTN (hypertension)   Malnutrition of moderate degree   Acute stroke: Patient presented with confusion.  Initial CT head 12/12 > No significant acute intracranial infarct or hemorrhage. MRI brain 12/12 confirmed small acute versus early subacute infarct in the posterior right frontal lobe,  subcortical white matter. CTA head and neck >No large vessel occlusion. LDL 58, HbA1c 5.5. 2D echo left ventricular ejection fraction 65%,  No atrial level shunt. Neurology recommended Plavix  indefinitely and 3 weeks of aspirin  which he completed course. PT/ OT recommended inpatient rehab.  Insurance denied, P2P denied SNF Continue Plavix  Does not have VA benefit for rehab.    Acute metabolic encephalopathy: Present on admission presumably secondary to acute stroke complicated by underlying dementia. EEG suggested generalized cerebral dysfunction without seizure. Ammonia Level  26 > WNL He is back to baseline mental status.   Vascular dementia: Cognitive impairment: - Noted - B12 1200, TSH 1.004, HIV nonreactive. -Thiamine  level 94 -Continue Thiamine  supplement.     Hypertension: -Continue amlodipine .   Diabetes type 2 HbA1c 5.5 Well controlled.    Hyperlipidemia -Continue Lipitor.   Tobacco abuse Counseled.   Moderate malnutrition Continue supplements.   Thrombocytopenia: Monitor. Improving.    DVT prophylaxis: SCDs Code Status: Full code Family Communication: No family at bed side Disposition Plan:   Status is: Inpatient Remains inpatient appropriate because: Difficult disposition.  TOC is aware   Consultants:  Neurology  Procedures: Echo   Antimicrobials:  Anti-infectives (From admission, onward)    None      Subjective: Patient states he is feeling well. No complaints. Was seen ambulating in the halls.   Objective: Vitals:   10/07/24 2108 10/08/24 0052 10/08/24 0530 10/08/24 0537  BP: 122/68 138/74 (!) 164/83 128/75  Pulse: (!) 57 (!) 57 (!) 58   Resp: 17 17 18    Temp: 98 F (36.7 C) 97.6 F (36.4 C) 98.3 F (36.8 C)   TempSrc:      SpO2: 98% 98% 98%   Weight:      Height:        Intake/Output Summary (Last 24 hours) at 10/08/2024 1409 Last data filed at 10/08/2024 0800 Gross per 24 hour  Intake 480 ml  Output --  Net 480 ml   Filed Weights   09/02/24 1440  Weight: 80.2 kg    Examination:   General: Alert, oriented X2 Eyes: Pupils equal, reactive  Oral cavity: moist mucous membranes  Head: Atraumatic, normocephalic  Neck: supple  Chest: clear to auscultation. No crackles, no wheezes  CVS: S1,S2 RRR. No murmurs  Abd: No distention, soft, non-tender. No masses palpable  Extr: No edema   MSK: No joint deformities or swelling  Neurological: Grossly intact.      Data Reviewed: I have  personally reviewed following labs and imaging studies  CBC: Recent Labs  Lab 10/08/24 0535  WBC 4.3  HGB 13.4  HCT 39.0  MCV 88.8  PLT 92*   Basic Metabolic Panel: Recent Labs  Lab 10/08/24 0535  NA 141  K 4.3  CL 107  CO2 24  GLUCOSE 136*  BUN 20  CREATININE 0.90  CALCIUM  9.1  MG 2.1   PHOS 2.9   GFR: Estimated Creatinine Clearance: 85 mL/min (by C-G formula based on SCr of 0.9 mg/dL). Liver Function Tests: No results for input(s): AST, ALT, ALKPHOS, BILITOT, PROT, ALBUMIN in the last 168 hours. No results for input(s): LIPASE, AMYLASE in the last 168 hours. No results for input(s): AMMONIA in the last 168 hours. Coagulation Profile: No results for input(s): INR, PROTIME in the last 168 hours. Cardiac Enzymes: No results for input(s): CKTOTAL, CKMB, CKMBINDEX, TROPONINI in the last 168 hours. BNP (last 3 results) No results for input(s): PROBNP in the last 8760 hours. HbA1C: No results for input(s): HGBA1C in the last 72 hours. CBG: Recent Labs  Lab 10/03/24 2045  GLUCAP 151*   Lipid Profile: No results for input(s): CHOL, HDL, LDLCALC, TRIG, CHOLHDL, LDLDIRECT in the last 72 hours. Thyroid Function Tests: No results for input(s): TSH, T4TOTAL, FREET4, T3FREE, THYROIDAB in the last 72 hours. Anemia Panel: No results for input(s): VITAMINB12, FOLATE, FERRITIN, TIBC, IRON, RETICCTPCT in the last 72 hours. Sepsis Labs: No results for input(s): PROCALCITON, LATICACIDVEN in the last 168 hours.  No results found for this or any previous visit (from the past 240 hours).   Radiology Studies: No results found.  Scheduled Meds:   stroke: early stages of recovery book   Does not apply Once   amLODipine   5 mg Oral Daily   atorvastatin   40 mg Oral q1800   clopidogrel   75 mg Oral Daily   docusate sodium   100 mg Oral BID   famotidine   20 mg Oral BID   multivitamin with minerals  1 tablet Oral Daily   polyethylene glycol  17 g Oral Daily   senna  2 tablet Oral QHS   sodium chloride  flush  3 mL Intravenous Once   thiamine   100 mg Oral Daily   Continuous Infusions:   LOS: 31 days    Time spent: 25 Mins    MDALA-GAUSI, GOLDEN PILLOW, MD [facility]   If 7PM-7AM, please  contact night-coverage  "

## 2024-10-09 NOTE — Progress Notes (Signed)
 " PROGRESS NOTE    Matthew Roman  FMW:[id number] DOB: 1955-10-17 DOA: 09/02/2024  [facility]P: [facility]   Brief Narrative:  Matthew Roman is a 69 y o man with past medical history significant for dementia, TIA, cirrhosis, diabetes type 2, hyperlipidemia, hepatic encephalopathy, tobacco use, sleep apnea. Patient presented secondary to confusion and left-sided neglect found to have evidence of an acute stroke. Neurology consulted. Patient started on dual antiplatelet therapy with aspirin  and Plavix . Currently awaiting placement.   Assessment & Plan:   Principal Problem:   Acute stroke due to ischemia [facility]) Active Problems:   Hyperlipidemia   Vascular dementia, unspecified severity, without behavioral disturbance, psychotic disturbance, mood disturbance, and anxiety (HCC)   AMS (altered mental status)   HTN (hypertension)   Malnutrition of moderate degree   Acute stroke: Patient presented with confusion.  Initial CT head 12/12 > No significant acute intracranial infarct or hemorrhage. MRI brain 12/12 confirmed small acute versus early subacute infarct in the posterior right frontal lobe,  subcortical white matter. CTA head and neck >No large vessel occlusion. LDL 58, HbA1c 5.5. 2D echo left ventricular ejection fraction 65%,  No atrial level shunt. Neurology recommended Plavix  indefinitely and 3 weeks of aspirin  which he completed course. PT/ OT recommended inpatient rehab.  Insurance denied, P2P denied SNF Continue Plavix  Does not have VA benefit for rehab.    Acute metabolic encephalopathy: Present on admission presumably secondary to acute stroke complicated by underlying dementia. EEG suggested generalized cerebral dysfunction without seizure. Ammonia Level  26 > WNL He is back to baseline mental status.   Vascular dementia: Cognitive impairment: - Noted - B12 1200, TSH 1.004, HIV nonreactive. -Thiamine  level 94 -Continue Thiamine  supplement.     Hypertension: -Continue amlodipine .   Diabetes type 2 HbA1c 5.5 Well controlled.    Hyperlipidemia -Continue Lipitor.   Tobacco abuse Counseled.   Moderate malnutrition Continue supplements.   Thrombocytopenia: Monitor. Improving.    DVT prophylaxis: SCDs Code Status: Full code Family Communication: No family at bed side Disposition Plan:   Status is: Inpatient Remains inpatient appropriate because: Difficult disposition.  TOC is aware   Consultants:  Neurology  Procedures: Echo   Antimicrobials:  Anti-infectives (From admission, onward)    None      Subjective: Patient has no complaints.   Objective: Vitals:   10/08/24 2026 10/09/24 0019 10/09/24 0444 10/09/24 0818  BP: 139/74 118/76 132/67 123/68  Pulse: (!) 57 66 (!) 59 64  Resp: 18 17 17    Temp: 97.9 F (36.6 C) 97.9 F (36.6 C) 97.9 F (36.6 C) 97.7 F (36.5 C)  TempSrc:      SpO2: 99% 96% 97% 98%  Weight:      Height:        Intake/Output Summary (Last 24 hours) at 10/09/2024 1404 Last data filed at 10/09/2024 1245 Gross per 24 hour  Intake 840 ml  Output --  Net 840 ml   Filed Weights   09/02/24 1440  Weight: 80.2 kg    Examination:   General: Alert, oriented X2 Eyes: Pupils equal, reactive  Oral cavity: moist mucous membranes  Head: Atraumatic, normocephalic  Neck: supple  Chest: clear to auscultation. No crackles, no wheezes  CVS: S1,S2 RRR. No murmurs  Abd: No distention, soft, non-tender. No masses palpable  Extr: No edema   MSK: No joint deformities or swelling  Neurological: Grossly intact.      Data Reviewed: I have personally reviewed following labs and imaging studies  CBC:  Recent Labs  Lab 10/08/24 0535  WBC 4.3  HGB 13.4  HCT 39.0  MCV 88.8  PLT 92*   Basic Metabolic Panel: Recent Labs  Lab 10/08/24 0535  NA 141  K 4.3  CL 107  CO2 24  GLUCOSE 136*  BUN 20  CREATININE 0.90  CALCIUM  9.1  MG 2.1  PHOS 2.9   GFR: Estimated Creatinine  Clearance: 85 mL/min (by C-G formula based on SCr of 0.9 mg/dL). Liver Function Tests: No results for input(s): AST, ALT, ALKPHOS, BILITOT, PROT, ALBUMIN in the last 168 hours. No results for input(s): LIPASE, AMYLASE in the last 168 hours. No results for input(s): AMMONIA in the last 168 hours. Coagulation Profile: No results for input(s): INR, PROTIME in the last 168 hours. Cardiac Enzymes: No results for input(s): CKTOTAL, CKMB, CKMBINDEX, TROPONINI in the last 168 hours. BNP (last 3 results) No results for input(s): PROBNP in the last 8760 hours. HbA1C: No results for input(s): HGBA1C in the last 72 hours. CBG: Recent Labs  Lab 10/03/24 2045  GLUCAP 151*   Lipid Profile: No results for input(s): CHOL, HDL, LDLCALC, TRIG, CHOLHDL, LDLDIRECT in the last 72 hours. Thyroid Function Tests: No results for input(s): TSH, T4TOTAL, FREET4, T3FREE, THYROIDAB in the last 72 hours. Anemia Panel: No results for input(s): VITAMINB12, FOLATE, FERRITIN, TIBC, IRON, RETICCTPCT in the last 72 hours. Sepsis Labs: No results for input(s): PROCALCITON, LATICACIDVEN in the last 168 hours.  No results found for this or any previous visit (from the past 240 hours).   Radiology Studies: No results found.  Scheduled Meds:   stroke: early stages of recovery book   Does not apply Once   amLODipine   5 mg Oral Daily   atorvastatin   40 mg Oral q1800   clopidogrel   75 mg Oral Daily   docusate sodium   100 mg Oral BID   famotidine   20 mg Oral BID   multivitamin with minerals  1 tablet Oral Daily   polyethylene glycol  17 g Oral Daily   senna  2 tablet Oral QHS   sodium chloride  flush  3 mL Intravenous Once   thiamine   100 mg Oral Daily   Continuous Infusions:   LOS: 32 days    Time spent: 25 Mins    MDALA-GAUSI, GOLDEN PILLOW, MD [facility]   If 7PM-7AM, please contact night-coverage  "

## 2024-10-10 DIAGNOSIS — I639 Cerebral infarction, unspecified: Secondary | ICD-10-CM | POA: Diagnosis not present

## 2024-10-10 NOTE — Plan of Care (Signed)
  Problem: Education: Goal: Knowledge of disease or condition will improve Outcome: Adequate for Discharge Goal: Knowledge of secondary prevention will improve (MUST DOCUMENT ALL) Outcome: Adequate for Discharge Goal: Knowledge of patient specific risk factors will improve (DELETE if not current risk factor) Outcome: Adequate for Discharge

## 2024-10-10 NOTE — Progress Notes (Signed)
 " PROGRESS NOTE    Matthew Roman  FMW:[id number] DOB: 07/28/1956 DOA: 09/02/2024 [facility]: [facility]   Brief Narrative: 69 year old with past medical history significant for dementia, TIA, cirrhosis, diabetes type 2, hyperlipidemia, hepatic encephalopathy, tobacco use, sleep apnea.  Patient presents secondary to confusion and left-sided neglect found to have evidence of an acute stroke.  Neurology consulted.  Patient started on dual antiplatelet therapy with aspirin  and Plavix .  Currently awaiting placement.   Assessment & Plan:   Principal Problem:   Acute stroke due to ischemia [facility]) Active Problems:   Hyperlipidemia   Vascular dementia, unspecified severity, without behavioral disturbance, psychotic disturbance, mood disturbance, and anxiety (HCC)   AMS (altered mental status)   HTN (hypertension)   Malnutrition of moderate degree  1-Acute stroke - Patient presents with confusion.  Initial CT head 12/12 no significant acute intracranial infarct or hemorrhage. - MRI brain 12/12 confirmed small acute versus early subacute infarct in the posterior right frontal lobe subcortical white matter. - CTA head and neck no large vessel occlusion LDL 58, A1c 5.5. 2D echo left ventricular ejection fraction 65% no atrial level shunt. Neurology recommended Plavix  indefinitely and 3 weeks of aspirin  which completed course PT OT recommended inpatient rehab.  Insurance denied Continue Plavix  Does not have VA benefit for rehab.  Awaiting placement.   Acute metabolic encephalopathy Present on admission presumably secondary to acute stroke complicated by underlying dementia - EEG suggested generalized cerebral dysfunction without seizure - Ammonia 26  Vascular dementia Cognitive impairment - Noted - B12 1200, TSH 1.004, HIV nonreactive -Thiamine  level 94 -on Thiamine  supplement.   Hypertension: -Continue amlodipine   Diabetes type 2 A1c 5.5 Well controlled.    Hyperlipidemia -Continue Lipitor  Tobacco abuse Counseled.  Moderate malnutrition Continue supplements  Thrombocytopenia: Monitor. Improving.        Nutrition Problem: Moderate Malnutrition Etiology: social / environmental circumstances    Signs/Symptoms: mild fat depletion, mild muscle depletion    Interventions: Ensure Enlive (each supplement provides 350kcal and 20 grams of protein), MVI  Estimated body mass index is 23.98 kg/m as calculated from the following:   Height as of this encounter: 6' (1.829 m).   Weight as of this encounter: 80.2 kg.   DVT prophylaxis: SCDs Code Status: Full code Family Communication: Care discussed with patient Disposition Plan:  Status is: Inpatient Remains inpatient appropriate because: Awaiting disposition,    Consultants:  Neurology  Procedures:  ECHO  Antimicrobials:    Subjective: No complaints.   Objective: Vitals:   10/09/24 1600 10/09/24 1944 10/09/24 2349 10/10/24 0510  BP: 115/67 128/71 128/74 130/77  Pulse: (!) 54 (!) 57 60 (!) 58  Resp:  17 17 18   Temp:  97.7 F (36.5 C) 97.6 F (36.4 C) 97.9 F (36.6 C)  TempSrc:      SpO2: 100% 100% 100% 97%  Weight:      Height:        Intake/Output Summary (Last 24 hours) at 10/10/2024 0735 Last data filed at 10/09/2024 1245 Gross per 24 hour  Intake 840 ml  Output --  Net 840 ml    Filed Weights   09/02/24 1440  Weight: 80.2 kg    Examination:  General exam: NAD Respiratory system: CTA Cardiovascular system: SS 1, S 2 RRR Gastrointestinal system: BBS present, soft, nt  Data Reviewed: I have personally reviewed following labs and imaging studies  CBC: Recent Labs  Lab 10/08/24 0535  WBC 4.3  HGB 13.4  HCT 39.0  MCV  88.8  PLT 92*   Basic Metabolic Panel: Recent Labs  Lab 10/08/24 0535  NA 141  K 4.3  CL 107  CO2 24  GLUCOSE 136*  BUN 20  CREATININE 0.90  CALCIUM  9.1  MG 2.1  PHOS 2.9   GFR: Estimated Creatinine  Clearance: 85 mL/min (by C-G formula based on SCr of 0.9 mg/dL). Liver Function Tests: No results for input(s): AST, ALT, ALKPHOS, BILITOT, PROT, ALBUMIN in the last 168 hours. No results for input(s): LIPASE, AMYLASE in the last 168 hours. No results for input(s): AMMONIA in the last 168 hours. Coagulation Profile: No results for input(s): INR, PROTIME in the last 168 hours. Cardiac Enzymes: No results for input(s): CKTOTAL, CKMB, CKMBINDEX, TROPONINI in the last 168 hours. BNP (last 3 results) No results for input(s): PROBNP in the last 8760 hours. HbA1C: No results for input(s): HGBA1C in the last 72 hours. CBG: Recent Labs  Lab 10/03/24 2045  GLUCAP 151*   Lipid Profile: No results for input(s): CHOL, HDL, LDLCALC, TRIG, CHOLHDL, LDLDIRECT in the last 72 hours. Thyroid Function Tests: No results for input(s): TSH, T4TOTAL, FREET4, T3FREE, THYROIDAB in the last 72 hours. Anemia Panel: No results for input(s): VITAMINB12, FOLATE, FERRITIN, TIBC, IRON, RETICCTPCT in the last 72 hours. Sepsis Labs: No results for input(s): PROCALCITON, LATICACIDVEN in the last 168 hours.  No results found for this or any previous visit (from the past 240 hours).       Radiology Studies: No results found.      Scheduled Meds:   stroke: early stages of recovery book   Does not apply Once   amLODipine   5 mg Oral Daily   atorvastatin   40 mg Oral q1800   clopidogrel   75 mg Oral Daily   docusate sodium   100 mg Oral BID   famotidine   20 mg Oral BID   multivitamin with minerals  1 tablet Oral Daily   polyethylene glycol  17 g Oral Daily   senna  2 tablet Oral QHS   sodium chloride  flush  3 mL Intravenous Once   thiamine   100 mg Oral Daily   Continuous Infusions:   LOS: 33 days    Time spent: 35 Minutes    [name] [name], MD [facility]   If 7PM-7AM, please contact  night-coverage [email or web address]  10/10/2024, 7:35 AM   "

## 2024-10-11 DIAGNOSIS — I639 Cerebral infarction, unspecified: Secondary | ICD-10-CM | POA: Diagnosis not present

## 2024-10-11 NOTE — Plan of Care (Signed)
" °  Problem: Coping: Goal: Will identify appropriate support needs Outcome: Progressing   Problem: Health Behavior/Discharge Planning: Goal: Ability to manage health-related needs will improve Outcome: Progressing   Problem: Self-Care: Goal: Ability to communicate needs accurately will improve Outcome: Progressing   Problem: Coping: Goal: Ability to adjust to condition or change in health will improve Outcome: Progressing   Problem: Health Behavior/Discharge Planning: Goal: Ability to manage health-related needs will improve Outcome: Progressing   Problem: Nutritional: Goal: Maintenance of adequate nutrition will improve Outcome: Progressing   Problem: Nutrition: Goal: Adequate nutrition will be maintained Outcome: Progressing   Problem: Pain Managment: Goal: General experience of comfort will improve and/or be controlled Outcome: Progressing   "

## 2024-10-11 NOTE — Plan of Care (Signed)
  Problem: Education: Goal: Knowledge of disease or condition will improve Outcome: Not Progressing   Problem: Coping: Goal: Will verbalize positive feelings about self Outcome: Not Progressing   Problem: Health Behavior/Discharge Planning: Goal: Ability to manage health-related needs will improve Outcome: Not Progressing

## 2024-10-11 NOTE — Progress Notes (Signed)
 " PROGRESS NOTE    Matthew Roman  FMW:[id number] DOB: Aug 05, 1956 DOA: 09/02/2024 PCP: [facility]   Brief Narrative: 69 year old with past medical history significant for dementia, TIA, cirrhosis, diabetes type 2, hyperlipidemia, hepatic encephalopathy, tobacco use, sleep apnea.  Patient presents secondary to confusion and left-sided neglect found to have evidence of an acute stroke.  Neurology consulted.  Patient started on dual antiplatelet therapy with aspirin  and Plavix .  Currently awaiting placement.   Assessment & Plan:   Principal Problem:   Acute stroke due to ischemia [facility]) Active Problems:   Hyperlipidemia   Vascular dementia, unspecified severity, without behavioral disturbance, psychotic disturbance, mood disturbance, and anxiety (HCC)   AMS (altered mental status)   HTN (hypertension)   Malnutrition of moderate degree  1-Acute stroke - Patient presents with confusion.  Initial CT head 12/12 no significant acute intracranial infarct or hemorrhage. - MRI brain 12/12 confirmed small acute versus early subacute infarct in the posterior right frontal lobe subcortical white matter. - CTA head and neck no large vessel occlusion -LDL 58, A1c 5.5. -2D echo left ventricular ejection fraction 65% no atrial level shunt. -Neurology recommended Plavix  indefinitely and 3 weeks of aspirin  which completed course -PT OT recommended inpatient rehab.  Insurance denied -Continue Plavix  -Does not have VA benefit for rehab.  -Awaiting placement.   Acute metabolic encephalopathy Present on admission presumably secondary to acute stroke complicated by underlying dementia - EEG suggested generalized cerebral dysfunction without seizure - Ammonia 26 -Stable.   Vascular dementia Cognitive impairment - Noted - B12 1200, TSH 1.004, HIV nonreactive -Thiamine  level 94 -on Thiamine  supplement.   Hypertension: -Continue amlodipine   Diabetes type 2 A1c 5.5 Well controlled.    Hyperlipidemia -Continue Lipitor  Tobacco abuse Counseled.  Moderate malnutrition Continue supplements  Thrombocytopenia: Monitor. Improving.        Nutrition Problem: Moderate Malnutrition Etiology: social / environmental circumstances    Signs/Symptoms: mild fat depletion, mild muscle depletion    Interventions: Ensure Enlive (each supplement provides 350kcal and 20 grams of protein), MVI  Estimated body mass index is 23.98 kg/m as calculated from the following:   Height as of this encounter: 6' (1.829 m).   Weight as of this encounter: 80.2 kg.   DVT prophylaxis: SCDs Code Status: Full code Family Communication: Care discussed with patient Disposition Plan:  Status is: Inpatient Remains inpatient appropriate because: Awaiting disposition,    Consultants:  Neurology  Procedures:  ECHO  Antimicrobials:    Subjective: Walking in the hall, no new complaints.   Objective: Vitals:   10/11/24 0045 10/11/24 0425 10/11/24 0752 10/11/24 1148  BP: 125/66 128/63 124/66 126/80  Pulse: 70 61 (!) 54 (!) 57  Resp: 18 16 18 16   Temp: (!) 97.5 F (36.4 C) 97.7 F (36.5 C) 97.6 F (36.4 C) 97.9 F (36.6 C)  TempSrc: Oral Oral Oral Oral  SpO2: 99% 98% 98% 98%  Weight:      Height:       No intake or output data in the 24 hours ending 10/11/24 1242   Filed Weights   09/02/24 1440  Weight: 80.2 kg    Examination:  General exam: NAD Respiratory system: CTA Cardiovascular system: S 1, S 2 RRR Gastrointestinal system: BS present soft,   Data Reviewed: I have personally reviewed following labs and imaging studies  CBC: Recent Labs  Lab 10/08/24 0535  WBC 4.3  HGB 13.4  HCT 39.0  MCV 88.8  PLT 92*   Basic Metabolic Panel:  Recent Labs  Lab 10/08/24 0535  NA 141  K 4.3  CL 107  CO2 24  GLUCOSE 136*  BUN 20  CREATININE 0.90  CALCIUM  9.1  MG 2.1  PHOS 2.9   GFR: Estimated Creatinine Clearance: 85 mL/min (by C-G formula based on  SCr of 0.9 mg/dL). Liver Function Tests: No results for input(s): AST, ALT, ALKPHOS, BILITOT, PROT, ALBUMIN in the last 168 hours. No results for input(s): LIPASE, AMYLASE in the last 168 hours. No results for input(s): AMMONIA in the last 168 hours. Coagulation Profile: No results for input(s): INR, PROTIME in the last 168 hours. Cardiac Enzymes: No results for input(s): CKTOTAL, CKMB, CKMBINDEX, TROPONINI in the last 168 hours. BNP (last 3 results) No results for input(s): PROBNP in the last 8760 hours. HbA1C: No results for input(s): HGBA1C in the last 72 hours. CBG: No results for input(s): GLUCAP in the last 168 hours.  Lipid Profile: No results for input(s): CHOL, HDL, LDLCALC, TRIG, CHOLHDL, LDLDIRECT in the last 72 hours. Thyroid Function Tests: No results for input(s): TSH, T4TOTAL, FREET4, T3FREE, THYROIDAB in the last 72 hours. Anemia Panel: No results for input(s): VITAMINB12, FOLATE, FERRITIN, TIBC, IRON, RETICCTPCT in the last 72 hours. Sepsis Labs: No results for input(s): PROCALCITON, LATICACIDVEN in the last 168 hours.  No results found for this or any previous visit (from the past 240 hours).       Radiology Studies: No results found.      Scheduled Meds:   stroke: early stages of recovery book   Does not apply Once   amLODipine   5 mg Oral Daily   atorvastatin   40 mg Oral q1800   clopidogrel   75 mg Oral Daily   docusate sodium   100 mg Oral BID   famotidine   20 mg Oral BID   multivitamin with minerals  1 tablet Oral Daily   polyethylene glycol  17 g Oral Daily   senna  2 tablet Oral QHS   sodium chloride  flush  3 mL Intravenous Once   thiamine   100 mg Oral Daily   Continuous Infusions:   LOS: 34 days    Time spent: 35 Minutes    Erion Weightman A Anthony Tamburo, MD [facility]   If 7PM-7AM, please contact night-coverage [email or web address]  10/11/2024, 12:42 PM   "

## 2024-10-11 NOTE — TOC Progression Note (Signed)
 Transition of Care [facility]) - Progression Note    Patient Details  Name: Matthew Roman MRN: [id number] Date of Birth: Jan 14, 1956  Transition of Care [facility]) CM/SW Contact  [name], [place] Phone Number: 10/11/2024, 3:05 PM  Clinical Narrative:     CSW submitted APS report to [facility] last week and is awaiting response via letter if case has been accepted or not. Patient does not have new bed offers at this time.   Expected Discharge Plan: Skilled Nursing Facility Barriers to Discharge: Insurance Authorization               Expected Discharge Plan and Services       Living arrangements for the past 2 months: Single Family Home                                       Social Drivers of Health (SDOH) Interventions SDOH Screenings   Food Insecurity: Patient Unable To Answer (09/03/2024)  Housing: Patient Unable To Answer (09/03/2024)  Transportation Needs: Patient Unable To Answer (09/03/2024)  Utilities: Patient Unable To Answer (09/03/2024)  Social Connections: Patient Unable To Answer (09/03/2024)  Tobacco Use: High Risk (09/03/2024)    Readmission Risk Interventions     No data to display

## 2024-10-12 DIAGNOSIS — I639 Cerebral infarction, unspecified: Secondary | ICD-10-CM | POA: Diagnosis not present

## 2024-10-12 NOTE — Progress Notes (Signed)
 " PROGRESS NOTE    Matthew Roman  FMW:[id number] DOB: [birth year] DOA: 09/02/2024 [facility]: [facility]   Brief Narrative: 69 year old with past medical history significant for dementia, TIA, cirrhosis, diabetes type 2, hyperlipidemia, hepatic encephalopathy, tobacco use, sleep apnea.  Patient presents secondary to confusion and left-sided neglect found to have evidence of an acute stroke.  Neurology consulted.  Patient started on dual antiplatelet therapy with aspirin  and Plavix .  Currently awaiting placement.  No changes in medical conditions.   Assessment & Plan:   Principal Problem:   Acute stroke due to ischemia [facility]) Active Problems:   Hyperlipidemia   Vascular dementia, unspecified severity, without behavioral disturbance, psychotic disturbance, mood disturbance, and anxiety (HCC)   AMS (altered mental status)   HTN (hypertension)   Malnutrition of moderate degree  1-Acute stroke - Patient presents with confusion.  Initial CT head 12/12 no significant acute intracranial infarct or hemorrhage. - MRI brain 12/12 confirmed small acute versus early subacute infarct in the posterior right frontal lobe subcortical white matter. - CTA head and neck no large vessel occlusion -LDL 58, A1c 5.5. -2D echo left ventricular ejection fraction 65% no atrial level shunt. -Neurology recommended Plavix  indefinitely and 3 weeks of aspirin  which completed course -PT OT recommended inpatient rehab.  Insurance denied -Continue Plavix  -Does not have VA benefit for rehab.  -Awaiting placement.   Acute metabolic encephalopathy Present on admission presumably secondary to acute stroke complicated by underlying dementia - EEG suggested generalized cerebral dysfunction without seizure - Ammonia 26 -Stable.   Vascular dementia Cognitive impairment - Noted - B12 1200, TSH 1.004, HIV nonreactive -Thiamine  level 94 -on Thiamine  supplement.   Hypertension: -Continue amlodipine   Diabetes  type 2 A1c 5.5 Well controlled.   Hyperlipidemia -Continue Lipitor  Tobacco abuse Counseled.  Moderate malnutrition Continue supplements  Thrombocytopenia: Monitor. Improving.        Nutrition Problem: Moderate Malnutrition Etiology: social / environmental circumstances    Signs/Symptoms: mild fat depletion, mild muscle depletion    Interventions: Ensure Enlive (each supplement provides 350kcal and 20 grams of protein), MVI  Estimated body mass index is 23.98 kg/m as calculated from the following:   Height as of this encounter: 6' (1.829 m).   Weight as of this encounter: 80.2 kg.   DVT prophylaxis: SCDs Code Status: Full code Family Communication: Care discussed with patient Disposition Plan:  Status is: Inpatient Remains inpatient appropriate because: Awaiting disposition,    Consultants:  Neurology  Procedures:  ECHO  Antimicrobials:    Subjective: Sitting in his room. No new complaints. Had a shower yesterday.   Objective: Vitals:   10/11/24 1613 10/11/24 1918 10/11/24 2355 10/12/24 0409  BP: (!) 110/58 122/65 127/75 135/73  Pulse: (!) 57 62 66 62  Resp:  17 18 18   Temp: 97.8 F (36.6 C) 97.8 F (36.6 C) 97.7 F (36.5 C) 97.7 F (36.5 C)  TempSrc:      SpO2: 98% 99% 98% 98%  Weight:      Height:       No intake or output data in the 24 hours ending 10/12/24 0757   Filed Weights   09/02/24 1440  Weight: 80.2 kg    Examination:  General exam: NAD Respiratory system: CTA Cardiovascular system: S 1, S 2 RRR Data Reviewed: I have personally reviewed following labs and imaging studies  CBC: Recent Labs  Lab 10/08/24 0535  WBC 4.3  HGB 13.4  HCT 39.0  MCV 88.8  PLT 92*   Basic  Metabolic Panel: Recent Labs  Lab 10/08/24 0535  NA 141  K 4.3  CL 107  CO2 24  GLUCOSE 136*  BUN 20  CREATININE 0.90  CALCIUM  9.1  MG 2.1  PHOS 2.9   GFR: Estimated Creatinine Clearance: 85 mL/min (by C-G formula based on SCr of 0.9  mg/dL). Liver Function Tests: No results for input(s): AST, ALT, ALKPHOS, BILITOT, PROT, ALBUMIN in the last 168 hours. No results for input(s): LIPASE, AMYLASE in the last 168 hours. No results for input(s): AMMONIA in the last 168 hours. Coagulation Profile: No results for input(s): INR, PROTIME in the last 168 hours. Cardiac Enzymes: No results for input(s): CKTOTAL, CKMB, CKMBINDEX, TROPONINI in the last 168 hours. BNP (last 3 results) No results for input(s): PROBNP in the last 8760 hours. HbA1C: No results for input(s): HGBA1C in the last 72 hours. CBG: No results for input(s): GLUCAP in the last 168 hours.  Lipid Profile: No results for input(s): CHOL, HDL, LDLCALC, TRIG, CHOLHDL, LDLDIRECT in the last 72 hours. Thyroid Function Tests: No results for input(s): TSH, T4TOTAL, FREET4, T3FREE, THYROIDAB in the last 72 hours. Anemia Panel: No results for input(s): VITAMINB12, FOLATE, FERRITIN, TIBC, IRON, RETICCTPCT in the last 72 hours. Sepsis Labs: No results for input(s): PROCALCITON, LATICACIDVEN in the last 168 hours.  No results found for this or any previous visit (from the past 240 hours).       Radiology Studies: No results found.      Scheduled Meds:   stroke: early stages of recovery book   Does not apply Once   amLODipine   5 mg Oral Daily   atorvastatin   40 mg Oral q1800   clopidogrel   75 mg Oral Daily   docusate sodium   100 mg Oral BID   famotidine   20 mg Oral BID   multivitamin with minerals  1 tablet Oral Daily   polyethylene glycol  17 g Oral Daily   senna  2 tablet Oral QHS   sodium chloride  flush  3 mL Intravenous Once   thiamine   100 mg Oral Daily   Continuous Infusions:   LOS: 35 days    Time spent: 35 Minutes    Ankit Degregorio A Beryle Zeitz, MD [facility]   If 7PM-7AM, please contact night-coverage [email or web address]  10/12/2024, 7:57 AM   "

## 2024-10-12 NOTE — Plan of Care (Signed)
  Problem: Coping: Goal: Will verbalize positive feelings about self Outcome: Progressing Goal: Will identify appropriate support needs Outcome: Progressing   Problem: Health Behavior/Discharge Planning: Goal: Ability to manage health-related needs will improve Outcome: Progressing   Problem: Self-Care: Goal: Ability to participate in self-care as condition permits will improve Outcome: Progressing Goal: Verbalization of feelings and concerns over difficulty with self-care will improve Outcome: Progressing

## 2024-10-13 DIAGNOSIS — I639 Cerebral infarction, unspecified: Secondary | ICD-10-CM | POA: Diagnosis not present

## 2024-10-13 NOTE — Plan of Care (Signed)
   Problem: Education: Goal: Knowledge of disease or condition will improve Outcome: Progressing Goal: Knowledge of secondary prevention will improve (MUST DOCUMENT ALL) Outcome: Progressing Goal: Knowledge of patient specific risk factors will improve (DELETE if not current risk factor) Outcome: Progressing

## 2024-10-13 NOTE — TOC Progression Note (Addendum)
 Transition of Care [facility]) - Progression Note    Patient Details  Name: Matthew Roman MRN: [id number] Date of Birth: 07-Apr-1956  Transition of Care [facility]) CM/SW Contact  [name], [place] Phone Number: 10/13/2024, 8:44 AM  Clinical Narrative:     Update 9:26 AM: APS SW Alesha spoke with caregiver and granddaughter and told CSW both were willing to sign patient into a facility if placement is found. CSW sent referral to [facility], which has a memory care unit, and will reach out to [facility] and [facility] for memory care placement.   CSW spoke with [facility] APS SW about patient. APS SW will be coming to the hospital to visit patient and to talk with CSW. Medicaid screening referral has been submitted. CSW will continue to follow.   Expected Discharge Plan: Skilled Nursing Facility Barriers to Discharge: Insurance Authorization               Expected Discharge Plan and Services       Living arrangements for the past 2 months: Single Family Home                                       Social Drivers of Health (SDOH) Interventions SDOH Screenings   Food Insecurity: Patient Unable To Answer (09/03/2024)  Housing: Patient Unable To Answer (09/03/2024)  Transportation Needs: Patient Unable To Answer (09/03/2024)  Utilities: Patient Unable To Answer (09/03/2024)  Social Connections: Patient Unable To Answer (09/03/2024)  Tobacco Use: High Risk (09/03/2024)    Readmission Risk Interventions     No data to display

## 2024-10-13 NOTE — Plan of Care (Signed)
" °  Problem: Coping: Goal: Will verbalize positive feelings about self 10/13/2024 1209 by Lucious Mitzie SAILOR, RN Outcome: Progressing 10/13/2024 1209 by Lucious Mitzie SAILOR, RN Outcome: Progressing Goal: Will identify appropriate support needs 10/13/2024 1209 by Lucious Mitzie SAILOR, RN Outcome: Progressing 10/13/2024 1209 by Lucious Mitzie SAILOR, RN Outcome: Progressing   "

## 2024-10-13 NOTE — Progress Notes (Signed)
 " PROGRESS NOTE    Matthew Roman  FMW:[id number] DOB: 04-20-1956 DOA: 09/02/2024 [facility]: [facility]   Brief Narrative: 69 year old with past medical history significant for dementia, TIA, cirrhosis, diabetes type 2, hyperlipidemia, hepatic encephalopathy, tobacco use, sleep apnea.  Patient presents secondary to confusion and left-sided neglect found to have evidence of an acute stroke.  Neurology consulted.  Patient started on dual antiplatelet therapy with aspirin  and Plavix .  Currently awaiting placement.  No changes in medical conditions.   Assessment & Plan:   Principal Problem:   Acute stroke due to ischemia [facility]) Active Problems:   Hyperlipidemia   Vascular dementia, unspecified severity, without behavioral disturbance, psychotic disturbance, mood disturbance, and anxiety (HCC)   AMS (altered mental status)   HTN (hypertension)   Malnutrition of moderate degree  1-Acute stroke - Patient presents with confusion.  Initial CT head 12/12 no significant acute intracranial infarct or hemorrhage. - MRI brain 12/12 confirmed small acute versus early subacute infarct in the posterior right frontal lobe subcortical white matter. - CTA head and neck no large vessel occlusion -LDL 58, A1c 5.5. -2D echo left ventricular ejection fraction 65% no atrial level shunt. -Neurology recommended Plavix  indefinitely and 3 weeks of aspirin  which completed course -PT OT recommended inpatient rehab.  Insurance denied -Continue Plavix  -Does not have VA benefit for rehab.  -Awaiting placement.   Acute metabolic encephalopathy Present on admission presumably secondary to acute stroke complicated by underlying dementia - EEG suggested generalized cerebral dysfunction without seizure - Ammonia 26 -Stable.   Vascular dementia Cognitive impairment - Noted - B12 1200, TSH 1.004, HIV nonreactive -Thiamine  level 94 -on Thiamine  supplement.   Hypertension: -Continue amlodipine   Diabetes  type 2 A1c 5.5 Well controlled.   Hyperlipidemia -Continue Lipitor  Tobacco abuse Counseled.  Moderate malnutrition Continue supplements  Thrombocytopenia: Monitor. Improving.        Nutrition Problem: Moderate Malnutrition Etiology: social / environmental circumstances    Signs/Symptoms: mild fat depletion, mild muscle depletion    Interventions: Ensure Enlive (each supplement provides 350kcal and 20 grams of protein), MVI  Estimated body mass index is 23.98 kg/m as calculated from the following:   Height as of this encounter: 6' (1.829 m).   Weight as of this encounter: 80.2 kg.   DVT prophylaxis: SCDs Code Status: Full code Family Communication: Care discussed with patient Disposition Plan:  Status is: Inpatient Remains inpatient appropriate because: Awaiting disposition,    Consultants:  Neurology  Procedures:  ECHO  Antimicrobials:    Subjective: No new complaints.  Objective: Vitals:   10/12/24 2019 10/13/24 0054 10/13/24 0432 10/13/24 0750  BP: (!) 145/69 132/77 122/76 135/70  Pulse: 73 69 61 (!) 58  Resp: 20 20 20 20   Temp: 97.8 F (36.6 C) 98.3 F (36.8 C) 97.7 F (36.5 C) 98 F (36.7 C)  TempSrc:  Oral    SpO2: 97% 96% 99% 99%  Weight:      Height:        Intake/Output Summary (Last 24 hours) at 10/13/2024 1300 Last data filed at 10/13/2024 0700 Gross per 24 hour  Intake 236 ml  Output --  Net 236 ml     Filed Weights   09/02/24 1440  Weight: 80.2 kg    Examination:  General exam: NAD Respiratory system: CTA Cardiovascular system:  S 1, S 2 RRR Data Reviewed: I have personally reviewed following labs and imaging studies  CBC: Recent Labs  Lab 10/08/24 0535  WBC 4.3  HGB 13.4  HCT 39.0  MCV 88.8  PLT 92*   Basic Metabolic Panel: Recent Labs  Lab 10/08/24 0535  NA 141  K 4.3  CL 107  CO2 24  GLUCOSE 136*  BUN 20  CREATININE 0.90  CALCIUM  9.1  MG 2.1  PHOS 2.9   GFR: Estimated Creatinine  Clearance: 85 mL/min (by C-G formula based on SCr of 0.9 mg/dL). Liver Function Tests: No results for input(s): AST, ALT, ALKPHOS, BILITOT, PROT, ALBUMIN in the last 168 hours. No results for input(s): LIPASE, AMYLASE in the last 168 hours. No results for input(s): AMMONIA in the last 168 hours. Coagulation Profile: No results for input(s): INR, PROTIME in the last 168 hours. Cardiac Enzymes: No results for input(s): CKTOTAL, CKMB, CKMBINDEX, TROPONINI in the last 168 hours. BNP (last 3 results) No results for input(s): PROBNP in the last 8760 hours. HbA1C: No results for input(s): HGBA1C in the last 72 hours. CBG: No results for input(s): GLUCAP in the last 168 hours.  Lipid Profile: No results for input(s): CHOL, HDL, LDLCALC, TRIG, CHOLHDL, LDLDIRECT in the last 72 hours. Thyroid Function Tests: No results for input(s): TSH, T4TOTAL, FREET4, T3FREE, THYROIDAB in the last 72 hours. Anemia Panel: No results for input(s): VITAMINB12, FOLATE, FERRITIN, TIBC, IRON, RETICCTPCT in the last 72 hours. Sepsis Labs: No results for input(s): PROCALCITON, LATICACIDVEN in the last 168 hours.  No results found for this or any previous visit (from the past 240 hours).       Radiology Studies: No results found.      Scheduled Meds:   stroke: early stages of recovery book   Does not apply Once   amLODipine   5 mg Oral Daily   atorvastatin   40 mg Oral q1800   clopidogrel   75 mg Oral Daily   docusate sodium   100 mg Oral BID   famotidine   20 mg Oral BID   multivitamin with minerals  1 tablet Oral Daily   polyethylene glycol  17 g Oral Daily   senna  2 tablet Oral QHS   sodium chloride  flush  3 mL Intravenous Once   thiamine   100 mg Oral Daily   Continuous Infusions:   LOS: 36 days    Time spent: 35 Minutes    Briar Sword A Abygayle Deltoro, MD [facility]   If 7PM-7AM, please contact  night-coverage [email or web address]  10/13/2024, 1:00 PM   "

## 2024-10-14 ENCOUNTER — Inpatient Hospital Stay (HOSPITAL_COMMUNITY)

## 2024-10-14 DIAGNOSIS — I639 Cerebral infarction, unspecified: Secondary | ICD-10-CM | POA: Diagnosis not present

## 2024-10-14 NOTE — TOC Progression Note (Addendum)
 Transition of Care [facility]) - Progression Note    Patient Details  Name: Matthew Roman MRN: [id number] Date of Birth: Dec 23, 1955  Transition of [facility]) CM/SW Contact  [name], [place] Phone Number: 10/14/2024, 2:51 PM  Clinical Narrative:     Financial counselor Phill Molt spoke with family member and caregiver to screen patient for Medicaid eligibility. Due to patient's dementia, Saprese asked if an incapacity letter should be pursued to allow for granddaughter and caregiver to share financial information and for Medicaid application to be submitted. CSW agrees that a letter should be written so that patient is able to apply for Medicaid coverage. CSW will update APS worker, Dorothe [phone]), on Medicaid process and placement options. CSW will continue to follow.    Expected Discharge Plan: Skilled Nursing Facility Barriers to Discharge: Insurance Authorization               Expected Discharge Plan and Services       Living arrangements for the past 2 months: Single Family Home                                       Social Drivers of Health (SDOH) Interventions SDOH Screenings   Food Insecurity: Patient Unable To Answer (09/03/2024)  Housing: Patient Unable To Answer (09/03/2024)  Transportation Needs: Patient Unable To Answer (09/03/2024)  Utilities: Patient Unable To Answer (09/03/2024)  Social Connections: Patient Unable To Answer (09/03/2024)  Tobacco Use: High Risk (09/03/2024)    Readmission Risk Interventions     No data to display

## 2024-10-14 NOTE — Plan of Care (Signed)
" °  Problem: Self-Care: Goal: Ability to communicate needs accurately will improve Outcome: Progressing   Problem: Coping: Goal: Will verbalize positive feelings about self Outcome: Progressing   Problem: Nutrition: Goal: Dietary intake will improve Outcome: Progressing   "

## 2024-10-14 NOTE — Plan of Care (Signed)
  Problem: Coping: Goal: Level of anxiety will decrease Outcome: Progressing   Problem: Safety: Goal: Ability to remain free from injury will improve Outcome: Progressing   Problem: Skin Integrity: Goal: Risk for impaired skin integrity will decrease Outcome: Not Progressing

## 2024-10-14 NOTE — Progress Notes (Signed)
 " PROGRESS NOTE    Matthew Roman  FMW:[id number] DOB: [birth year] DOA: 09/02/2024 PCP: [facility]   Brief Narrative: 69 year old with past medical history significant for dementia, TIA, cirrhosis, diabetes type 2, hyperlipidemia, hepatic encephalopathy, tobacco use, sleep apnea.  Patient presents secondary to confusion and left-sided neglect found to have evidence of an acute stroke.  Neurology consulted.  Patient started on dual antiplatelet therapy with aspirin  and Plavix .  Currently awaiting placement.  No changes in medical conditions.   Assessment & Plan:   Principal Problem:   Acute stroke due to ischemia [facility]) Active Problems:   Hyperlipidemia   Vascular dementia, unspecified severity, without behavioral disturbance, psychotic disturbance, mood disturbance, and anxiety (HCC)   AMS (altered mental status)   HTN (hypertension)   Malnutrition of moderate degree  1-Acute stroke - Patient presents with confusion.  Initial CT head 12/12 no significant acute intracranial infarct or hemorrhage. - MRI brain 12/12 confirmed small acute versus early subacute infarct in the posterior right frontal lobe subcortical white matter. - CTA head and neck no large vessel occlusion -LDL 58, A1c 5.5. -2D echo left ventricular ejection fraction 65% no atrial level shunt. -Neurology recommended Plavix  indefinitely and 3 weeks of aspirin  which completed course -PT OT recommended inpatient rehab.  Insurance denied -Continue Plavix  -Does not have [facility] benefit for rehab.  -Awaiting placement.   Cough;  Plan to check Chest  X ray.   Acute metabolic encephalopathy Present on admission presumably secondary to acute stroke complicated by underlying dementia - EEG suggested generalized cerebral dysfunction without seizure - Ammonia 26 -Stable.   Vascular dementia Cognitive impairment - Noted - B12 1200, TSH 1.004, HIV nonreactive -Thiamine  level 94 -on Thiamine  supplement.    Hypertension: -Continue amlodipine   Diabetes type 2 A1c 5.5 Well controlled.   Hyperlipidemia -Continue Lipitor  Tobacco abuse Counseled.  Moderate malnutrition Continue supplements  Thrombocytopenia: Monitor. Improving.        Nutrition Problem: Moderate Malnutrition Etiology: social / environmental circumstances    Signs/Symptoms: mild fat depletion, mild muscle depletion    Interventions: Ensure Enlive (each supplement provides 350kcal and 20 grams of protein), MVI  Estimated body mass index is 23.98 kg/m as calculated from the following:   Height as of this encounter: 6' (1.829 m).   Weight as of this encounter: 80.2 kg.   DVT prophylaxis: SCDs Code Status: Full code Family Communication: Care discussed with patient Disposition Plan:  Status is: Inpatient Remains inpatient appropriate because: Awaiting disposition,    Consultants:  Neurology  Procedures:  ECHO  Antimicrobials:    Subjective: Alert, report cough.   Objective: Vitals:   10/13/24 2034 10/14/24 0033 10/14/24 0355 10/14/24 0803  BP: (!) 152/67 126/80 (!) 140/79 129/71  Pulse: 67 70 65 60  Resp: 20 20 20 20   Temp: 97.8 F (36.6 C) (!) 97.5 F (36.4 C) 97.8 F (36.6 C) 98.4 F (36.9 C)  TempSrc:   Oral Oral  SpO2: 98% 97% 97% 97%  Weight:      Height:        Intake/Output Summary (Last 24 hours) at 10/14/2024 1411 Last data filed at 10/14/2024 0900 Gross per 24 hour  Intake 536 ml  Output --  Net 536 ml     Filed Weights   09/02/24 1440  Weight: 80.2 kg    Examination:  General exam: NAD Respiratory system; CTA Cardiovascular system:  S 1 S 2 RRR  Data Reviewed: I have personally reviewed following labs and imaging studies  CBC: Recent Labs  Lab 10/08/24 0535  WBC 4.3  HGB 13.4  HCT 39.0  MCV 88.8  PLT 92*   Basic Metabolic Panel: Recent Labs  Lab 10/08/24 0535  NA 141  K 4.3  CL 107  CO2 24  GLUCOSE 136*  BUN 20  CREATININE 0.90   CALCIUM  9.1  MG 2.1  PHOS 2.9   GFR: Estimated Creatinine Clearance: 83.8 mL/min (by C-G formula based on SCr of 0.9 mg/dL). Liver Function Tests: No results for input(s): AST, ALT, ALKPHOS, BILITOT, PROT, ALBUMIN in the last 168 hours. No results for input(s): LIPASE, AMYLASE in the last 168 hours. No results for input(s): AMMONIA in the last 168 hours. Coagulation Profile: No results for input(s): INR, PROTIME in the last 168 hours. Cardiac Enzymes: No results for input(s): CKTOTAL, CKMB, CKMBINDEX, TROPONINI in the last 168 hours. BNP (last 3 results) No results for input(s): PROBNP in the last 8760 hours. HbA1C: No results for input(s): HGBA1C in the last 72 hours. CBG: No results for input(s): GLUCAP in the last 168 hours.  Lipid Profile: No results for input(s): CHOL, HDL, LDLCALC, TRIG, CHOLHDL, LDLDIRECT in the last 72 hours. Thyroid Function Tests: No results for input(s): TSH, T4TOTAL, FREET4, T3FREE, THYROIDAB in the last 72 hours. Anemia Panel: No results for input(s): VITAMINB12, FOLATE, FERRITIN, TIBC, IRON, RETICCTPCT in the last 72 hours. Sepsis Labs: No results for input(s): PROCALCITON, LATICACIDVEN in the last 168 hours.  No results found for this or any previous visit (from the past 240 hours).       Radiology Studies: No results found.      Scheduled Meds:   stroke: early stages of recovery book   Does not apply Once   amLODipine   5 mg Oral Daily   atorvastatin   40 mg Oral q1800   clopidogrel   75 mg Oral Daily   docusate sodium   100 mg Oral BID   famotidine   20 mg Oral BID   multivitamin with minerals  1 tablet Oral Daily   polyethylene glycol  17 g Oral Daily   senna  2 tablet Oral QHS   sodium chloride  flush  3 mL Intravenous Once   thiamine   100 mg Oral Daily   Continuous Infusions:   LOS: 37 days    Time spent: 35 Minutes    Emalea Mix A Lanya Bucks, MD [facility]   If 7PM-7AM, please contact night-coverage [email or web address]  10/14/2024, 2:11 PM   "

## 2024-10-15 DIAGNOSIS — I639 Cerebral infarction, unspecified: Secondary | ICD-10-CM | POA: Diagnosis not present

## 2024-10-15 NOTE — Plan of Care (Signed)
  Problem: Education: Goal: Knowledge of disease or condition will improve Outcome: Progressing   Problem: Coping: Goal: Will verbalize positive feelings about self Outcome: Progressing   Problem: Self-Care: Goal: Ability to participate in self-care as condition permits will improve Outcome: Progressing   

## 2024-10-15 NOTE — Progress Notes (Signed)
 " PROGRESS NOTE    Matthew Roman  FMW:[id number] DOB: 1956/08/06 DOA: 09/02/2024 PCP: [facility]   Brief Narrative: 69 year old with past medical history significant for dementia, TIA, cirrhosis, diabetes type 2, hyperlipidemia, hepatic encephalopathy, tobacco use, sleep apnea.  Patient presents secondary to confusion and left-sided neglect found to have evidence of an acute stroke.  Neurology consulted.  Patient started on dual antiplatelet therapy with aspirin  and Plavix .  Currently awaiting placement.  No changes in medical conditions.   Assessment & Plan:   Principal Problem:   Acute stroke due to ischemia [facility]) Active Problems:   Hyperlipidemia   Vascular dementia, unspecified severity, without behavioral disturbance, psychotic disturbance, mood disturbance, and anxiety (HCC)   AMS (altered mental status)   HTN (hypertension)   Malnutrition of moderate degree  1-Acute stroke - Patient presents with confusion.  Initial CT head 12/12 no significant acute intracranial infarct or hemorrhage. - MRI brain 12/12 confirmed small acute versus early subacute infarct in the posterior right frontal lobe subcortical white matter. - CTA head and neck no large vessel occlusion -LDL 58, A1c 5.5. -2D echo left ventricular ejection fraction 65% no atrial level shunt. -Neurology recommended Plavix  indefinitely and 3 weeks of aspirin  which completed course -PT OT recommended inpatient rehab.  Insurance denied -Continue Plavix  -Does not have VA benefit for rehab.  -Awaiting placement.   Cough;  Chest x ray negative  Acute metabolic encephalopathy Present on admission presumably secondary to acute stroke complicated by underlying dementia - EEG suggested generalized cerebral dysfunction without seizure - Ammonia 26 -Stable.   Vascular dementia Cognitive impairment - Noted - B12 1200, TSH 1.004, HIV nonreactive -Thiamine  level 94 -on Thiamine  supplement.    Hypertension: -Continue amlodipine   Diabetes type 2 A1c 5.5 Well controlled.   Hyperlipidemia -Continue Lipitor  Tobacco abuse Counseled.  Moderate malnutrition Continue supplements  Thrombocytopenia: Monitor. Improving.        Nutrition Problem: Moderate Malnutrition Etiology: social / environmental circumstances    Signs/Symptoms: mild fat depletion, mild muscle depletion    Interventions: Ensure Enlive (each supplement provides 350kcal and 20 grams of protein), MVI  Estimated body mass index is 23.98 kg/m as calculated from the following:   Height as of this encounter: 6' (1.829 m).   Weight as of this encounter: 80.2 kg.   DVT prophylaxis: SCDs Code Status: Full code Family Communication: Care discussed with patient Disposition Plan:  Status is: Inpatient Remains inpatient appropriate because: Awaiting disposition,    Consultants:  Neurology  Procedures:  ECHO  Antimicrobials:    Subjective: Report cough better. No new complaints.   Objective: Vitals:   10/14/24 1956 10/15/24 0414 10/15/24 0736 10/15/24 1214  BP: (!) 155/76 (!) 140/92 138/79 132/76  Pulse: 70 68 (!) 59 (!) 56  Resp:   18 18  Temp: (!) 96.9 F (36.1 C) 98 F (36.7 C) 97.7 F (36.5 C)   TempSrc:      SpO2: 99% 96% 96% 99%  Weight:      Height:       No intake or output data in the 24 hours ending 10/15/24 1450    Filed Weights   09/02/24 1440  Weight: 80.2 kg    Examination:  General exam: NAD Respiratory system; CTA Cardiovascular system: S 1, S 2 RRR  Data Reviewed: I have personally reviewed following labs and imaging studies  CBC: No results for input(s): WBC, NEUTROABS, HGB, HCT, MCV, PLT in the last 168 hours.  Basic Metabolic Panel: No  results for input(s): NA, K, CL, CO2, GLUCOSE, BUN, CREATININE, CALCIUM , MG, PHOS in the last 168 hours.  GFR: Estimated Creatinine Clearance: 83.8 mL/min (by C-G formula  based on SCr of 0.9 mg/dL). Liver Function Tests: No results for input(s): AST, ALT, ALKPHOS, BILITOT, PROT, ALBUMIN in the last 168 hours. No results for input(s): LIPASE, AMYLASE in the last 168 hours. No results for input(s): AMMONIA in the last 168 hours. Coagulation Profile: No results for input(s): INR, PROTIME in the last 168 hours. Cardiac Enzymes: No results for input(s): CKTOTAL, CKMB, CKMBINDEX, TROPONINI in the last 168 hours. BNP (last 3 results) No results for input(s): PROBNP in the last 8760 hours. HbA1C: No results for input(s): HGBA1C in the last 72 hours. CBG: No results for input(s): GLUCAP in the last 168 hours.  Lipid Profile: No results for input(s): CHOL, HDL, LDLCALC, TRIG, CHOLHDL, LDLDIRECT in the last 72 hours. Thyroid Function Tests: No results for input(s): TSH, T4TOTAL, FREET4, T3FREE, THYROIDAB in the last 72 hours. Anemia Panel: No results for input(s): VITAMINB12, FOLATE, FERRITIN, TIBC, IRON, RETICCTPCT in the last 72 hours. Sepsis Labs: No results for input(s): PROCALCITON, LATICACIDVEN in the last 168 hours.  No results found for this or any previous visit (from the past 240 hours).       Radiology Studies: DG CHEST PORT 1 VIEW Result Date: 10/14/2024 CLINICAL DATA:  Cough EXAM: PORTABLE CHEST 1 VIEW COMPARISON:  September 02, 2024 FINDINGS: The heart size and mediastinal contours are within normal limits. Both lungs are clear. The visualized skeletal structures are unremarkable. IMPRESSION: No active disease. Electronically Signed   By: Lynwood Landy Raddle M.D.   On: 10/14/2024 16:03        Scheduled Meds:   stroke: early stages of recovery book   Does not apply Once   amLODipine   5 mg Oral Daily   atorvastatin   40 mg Oral q1800   clopidogrel   75 mg Oral Daily   docusate sodium   100 mg Oral BID   famotidine   20 mg Oral BID   multivitamin with minerals  1 tablet  Oral Daily   polyethylene glycol  17 g Oral Daily   senna  2 tablet Oral QHS   sodium chloride  flush  3 mL Intravenous Once   thiamine   100 mg Oral Daily   Continuous Infusions:   LOS: 38 days    Time spent: 35 Minutes    Balin Vandegrift A Keashia Haskins, MD [facility]   If 7PM-7AM, please contact night-coverage [email or web address]  10/15/2024, 2:50 PM   "

## 2024-10-15 NOTE — Plan of Care (Signed)
" °  Problem: Health Behavior/Discharge Planning: Goal: Ability to manage health-related needs will improve Outcome: Progressing   Problem: Coping: Goal: Ability to adjust to condition or change in health will improve Outcome: Progressing   Problem: Health Behavior/Discharge Planning: Goal: Ability to manage health-related needs will improve Outcome: Progressing   Problem: Education: Goal: Knowledge of disease or condition will improve Outcome: Not Progressing   "

## 2024-10-16 DIAGNOSIS — I639 Cerebral infarction, unspecified: Secondary | ICD-10-CM | POA: Diagnosis not present

## 2024-10-16 NOTE — Plan of Care (Signed)
" °  Problem: Pain Managment: Goal: General experience of comfort will improve and/or be controlled Outcome: Progressing   Problem: Skin Integrity: Goal: Risk for impaired skin integrity will decrease Outcome: Progressing   Problem: Safety: Goal: Ability to remain free from injury will improve Outcome: Not Progressing   "

## 2024-10-16 NOTE — Plan of Care (Signed)
  Problem: Education: Goal: Knowledge of disease or condition will improve Outcome: Adequate for Discharge Goal: Knowledge of secondary prevention will improve (MUST DOCUMENT ALL) Outcome: Adequate for Discharge Goal: Knowledge of patient specific risk factors will improve (DELETE if not current risk factor) Outcome: Adequate for Discharge

## 2024-10-16 NOTE — Progress Notes (Signed)
 " PROGRESS NOTE    Matthew Roman  FMW:[id number] DOB: 09-28-1955 DOA: 09/02/2024 [facility]CP: [facility]   Brief Narrative: 69 year old with past medical history significant for dementia, TIA, cirrhosis, diabetes type 2, hyperlipidemia, hepatic encephalopathy, tobacco use, sleep apnea.  Patient presents secondary to confusion and left-sided neglect found to have evidence of an acute stroke.  Neurology consulted.  Patient started on dual antiplatelet therapy with aspirin  and Plavix .  Currently awaiting placement.  Stable, awaiting placement.   Assessment & Plan:   Principal Problem:   Acute stroke due to ischemia [facility]) Active Problems:   Hyperlipidemia   Vascular dementia, unspecified severity, without behavioral disturbance, psychotic disturbance, mood disturbance, and anxiety (HCC)   AMS (altered mental status)   HTN (hypertension)   Malnutrition of moderate degree  1-Acute stroke - Patient presents with confusion.  Initial CT head 12/12 no significant acute intracranial infarct or hemorrhage. - MRI brain 12/12 confirmed small acute versus early subacute infarct in the posterior right frontal lobe subcortical white matter. - CTA head and neck no large vessel occlusion -LDL 58, A1c 5.5. -2D echo left ventricular ejection fraction 65% no atrial level shunt. -Neurology recommended Plavix  indefinitely and 3 weeks of aspirin  which completed course -PT OT recommended inpatient rehab.  Insurance denied -Continue Plavix  -Does not have [facility] benefit for rehab.  -Awaiting placement.   Cough;  Chest x ray negative  Acute metabolic encephalopathy Present on admission presumably secondary to acute stroke complicated by underlying dementia - EEG suggested generalized cerebral dysfunction without seizure - Ammonia 26 -Stable.   Vascular dementia Cognitive impairment - Noted - B12 1200, TSH 1.004, HIV nonreactive -Thiamine  level 94 -on Thiamine  supplement.    Hypertension: -Continue amlodipine   Diabetes type 2 A1c 5.5 Well controlled.   Hyperlipidemia -Continue Lipitor  Tobacco abuse Counseled.  Moderate malnutrition Continue supplements  Thrombocytopenia: Monitor. Improving.        Nutrition Problem: Moderate Malnutrition Etiology: social / environmental circumstances    Signs/Symptoms: mild fat depletion, mild muscle depletion    Interventions: Ensure Enlive (each supplement provides 350kcal and 20 grams of protein), MVI  Estimated body mass index is 23.98 kg/m as calculated from the following:   Height as of this encounter: 6' (1.829 m).   Weight as of this encounter: 80.2 kg.   DVT prophylaxis: SCDs Code Status: Full code Family Communication: Care discussed with patient Disposition Plan:  Status is: Inpatient Remains inpatient appropriate because: Awaiting disposition,    Consultants:  Neurology  Procedures:  ECHO  Antimicrobials:    Subjective: Walking in the hall, no complaints.   Objective: Vitals:   10/15/24 1214 10/15/24 1616 10/15/24 2347 10/16/24 0409  BP: 132/76 123/61 119/69 133/75  Pulse: (!) 56 (!) 51 65 (!) 58  Resp: 18 18 18 18   Temp:   97.8 F (36.6 C) 97.9 F (36.6 C)  TempSrc:   Oral Oral  SpO2: 99% 100% 96% 98%  Weight:      Height:       No intake or output data in the 24 hours ending 10/16/24 9361    Filed Weights   09/02/24 1440  Weight: 80.2 kg    Examination:  General exam: NAD Respiratory system; CTA Cardiovascular system: S 1, S 2 RRR Data Reviewed: I have personally reviewed following labs and imaging studies  CBC: No results for input(s): WBC, NEUTROABS, HGB, HCT, MCV, PLT in the last 168 hours.  Basic Metabolic Panel: No results for input(s): NA, K, CL, CO2, GLUCOSE,  BUN, CREATININE, CALCIUM , MG, PHOS in the last 168 hours.  GFR: Estimated Creatinine Clearance: 83.8 mL/min (by C-G formula based on SCr of 0.9  mg/dL). Liver Function Tests: No results for input(s): AST, ALT, ALKPHOS, BILITOT, PROT, ALBUMIN in the last 168 hours. No results for input(s): LIPASE, AMYLASE in the last 168 hours. No results for input(s): AMMONIA in the last 168 hours. Coagulation Profile: No results for input(s): INR, PROTIME in the last 168 hours. Cardiac Enzymes: No results for input(s): CKTOTAL, CKMB, CKMBINDEX, TROPONINI in the last 168 hours. BNP (last 3 results) No results for input(s): PROBNP in the last 8760 hours. HbA1C: No results for input(s): HGBA1C in the last 72 hours. CBG: No results for input(s): GLUCAP in the last 168 hours.  Lipid Profile: No results for input(s): CHOL, HDL, LDLCALC, TRIG, CHOLHDL, LDLDIRECT in the last 72 hours. Thyroid Function Tests: No results for input(s): TSH, T4TOTAL, FREET4, T3FREE, THYROIDAB in the last 72 hours. Anemia Panel: No results for input(s): VITAMINB12, FOLATE, FERRITIN, TIBC, IRON, RETICCTPCT in the last 72 hours. Sepsis Labs: No results for input(s): PROCALCITON, LATICACIDVEN in the last 168 hours.  No results found for this or any previous visit (from the past 240 hours).       Radiology Studies: DG CHEST PORT 1 VIEW Result Date: 10/14/2024 CLINICAL DATA:  Cough EXAM: PORTABLE CHEST 1 VIEW COMPARISON:  September 02, 2024 FINDINGS: The heart size and mediastinal contours are within normal limits. Both lungs are clear. The visualized skeletal structures are unremarkable. IMPRESSION: No active disease. Electronically Signed   By: Lynwood Landy Raddle M.D.   On: 10/14/2024 16:03        Scheduled Meds:   stroke: early stages of recovery book   Does not apply Once   amLODipine   5 mg Oral Daily   atorvastatin   40 mg Oral q1800   clopidogrel   75 mg Oral Daily   docusate sodium   100 mg Oral BID   famotidine   20 mg Oral BID   multivitamin with minerals  1 tablet Oral Daily    polyethylene glycol  17 g Oral Daily   senna  2 tablet Oral QHS   sodium chloride  flush  3 mL Intravenous Once   thiamine   100 mg Oral Daily   Continuous Infusions:   LOS: 39 days    Time spent: 35 Minutes    Matthew Leasure A Josey Forcier, MD [facility]   If 7PM-7AM, please contact night-coverage [email or web address]  10/16/2024, 6:38 AM   "

## 2024-10-17 NOTE — Progress Notes (Signed)
 " PROGRESS NOTE    Matthew Roman  FMW:[id number] DOB: 09-Jun-1956 DOA: 09/02/2024 PCP: [facility], [facility]   Brief Narrative: 69 year old with past medical history significant for dementia, TIA, cirrhosis, diabetes type 2, hyperlipidemia, hepatic encephalopathy, tobacco use, sleep apnea.  Patient presents secondary to confusion and left-sided neglect found to have evidence of an acute stroke.  Neurology consulted.  Patient started on dual antiplatelet therapy with aspirin  and Plavix .  Currently awaiting placement.  Stable, awaiting placement.   Assessment & Plan:   Principal Problem:   Acute stroke due to ischemia [facility]) Active Problems:   Hyperlipidemia   Vascular dementia, unspecified severity, without behavioral disturbance, psychotic disturbance, mood disturbance, and anxiety (HCC)   AMS (altered mental status)   HTN (hypertension)   Malnutrition of moderate degree  1-Acute stroke - Patient presents with confusion.  Initial CT head 12/12 no significant acute intracranial infarct or hemorrhage. - MRI brain 12/12 confirmed small acute versus early subacute infarct in the posterior right frontal lobe subcortical white matter. - CTA head and neck no large vessel occlusion -LDL 58, A1c 5.5. -2D echo left ventricular ejection fraction 65% no atrial level shunt. -Neurology recommended Plavix  indefinitely and 3 weeks of aspirin  which completed course -PT OT recommended inpatient rehab.  Insurance denied CIR -Continue Plavix . -Does not have [facility] benefit for rehab.  -Awaiting placement.   Cough;  Chest x ray negative Improved   Acute metabolic encephalopathy Present on admission presumably secondary to acute stroke complicated by underlying dementia - EEG suggested generalized cerebral dysfunction without seizure - Ammonia 26 -Stable.   Vascular dementia Cognitive impairment - Noted - B12 1200, TSH 1.004, HIV nonreactive -Thiamine  level 94 -on Thiamine  supplement.    Hypertension: -Continue amlodipine   Diabetes type 2 A1c 5.5 Well controlled.   Hyperlipidemia -Continue Lipitor  Tobacco abuse Counseled.  Moderate malnutrition Continue supplements  Thrombocytopenia: Monitor. Improving.        Nutrition Problem: Moderate Malnutrition Etiology: social / environmental circumstances    Signs/Symptoms: mild fat depletion, mild muscle depletion    Interventions: Ensure Enlive (each supplement provides 350kcal and 20 grams of protein), MVI  Estimated body mass index is 23.98 kg/m as calculated from the following:   Height as of this encounter: 6' (1.829 m).   Weight as of this encounter: 80.2 kg.   DVT prophylaxis: SCDs Code Status: Full code Family Communication: Care discussed with patient Disposition Plan:  Status is: Inpatient Remains inpatient appropriate because: Awaiting disposition,    Consultants:  Neurology  Procedures:  ECHO  Antimicrobials:    Subjective: He wake up to voice, went to bed late last night. No complaints.   Objective: Vitals:   10/16/24 1551 10/16/24 1958 10/17/24 0501 10/17/24 0813  BP: 124/74 130/72 134/62 126/72  Pulse: 70 (!) 56 (!) 59 62  Resp:   20   Temp: 97.9 F (36.6 C) 98.1 F (36.7 C) 97.7 F (36.5 C) 97.8 F (36.6 C)  TempSrc:      SpO2: 99% 99% 96% 98%  Weight:      Height:       No intake or output data in the 24 hours ending 10/17/24 1208    Filed Weights   09/02/24 1440  Weight: 80.2 kg    Examination:  General exam: NAD Respiratory system; CTA Cardiovascular system: S 1, S 2 RRR Data Reviewed: I have personally reviewed following labs and imaging studies  CBC: No results for input(s): WBC, NEUTROABS, HGB, HCT, MCV, PLT in the last  168 hours.  Basic Metabolic Panel: No results for input(s): NA, K, CL, CO2, GLUCOSE, BUN, CREATININE, CALCIUM , MG, PHOS in the last 168 hours.  GFR: Estimated Creatinine Clearance:  83.8 mL/min (by C-G formula based on SCr of 0.9 mg/dL). Liver Function Tests: No results for input(s): AST, ALT, ALKPHOS, BILITOT, PROT, ALBUMIN in the last 168 hours. No results for input(s): LIPASE, AMYLASE in the last 168 hours. No results for input(s): AMMONIA in the last 168 hours. Coagulation Profile: No results for input(s): INR, PROTIME in the last 168 hours. Cardiac Enzymes: No results for input(s): CKTOTAL, CKMB, CKMBINDEX, TROPONINI in the last 168 hours. BNP (last 3 results) No results for input(s): PROBNP in the last 8760 hours. HbA1C: No results for input(s): HGBA1C in the last 72 hours. CBG: No results for input(s): GLUCAP in the last 168 hours.  Lipid Profile: No results for input(s): CHOL, HDL, LDLCALC, TRIG, CHOLHDL, LDLDIRECT in the last 72 hours. Thyroid Function Tests: No results for input(s): TSH, T4TOTAL, FREET4, T3FREE, THYROIDAB in the last 72 hours. Anemia Panel: No results for input(s): VITAMINB12, FOLATE, FERRITIN, TIBC, IRON, RETICCTPCT in the last 72 hours. Sepsis Labs: No results for input(s): PROCALCITON, LATICACIDVEN in the last 168 hours.  No results found for this or any previous visit (from the past 240 hours).       Radiology Studies: No results found.       Scheduled Meds:   stroke: early stages of recovery book   Does not apply Once   amLODipine   5 mg Oral Daily   atorvastatin   40 mg Oral q1800   clopidogrel   75 mg Oral Daily   docusate sodium   100 mg Oral BID   famotidine   20 mg Oral BID   multivitamin with minerals  1 tablet Oral Daily   polyethylene glycol  17 g Oral Daily   senna  2 tablet Oral QHS   sodium chloride  flush  3 mL Intravenous Once   thiamine   100 mg Oral Daily   Continuous Infusions:   LOS: 40 days    Time spent: 35 Minutes    [name], MD [facility]   If 7PM-7AM, please contact  night-coverage [email or web address]  10/17/2024, 12:08 PM   "

## 2024-10-17 NOTE — Plan of Care (Signed)
" °  Problem: Education: Goal: Knowledge of disease or condition will improve Outcome: Progressing Goal: Knowledge of secondary prevention will improve (MUST DOCUMENT ALL) Outcome: Progressing Goal: Knowledge of patient specific risk factors will improve (DELETE if not current risk factor) Outcome: Progressing   Problem: Coping: Goal: Will verbalize positive feelings about self Outcome: Progressing Goal: Will identify appropriate support needs Outcome: Progressing   Problem: Health Behavior/Discharge Planning: Goal: Ability to manage health-related needs will improve Outcome: Progressing Goal: Goals will be collaboratively established with patient/family Outcome: Progressing   Problem: Self-Care: Goal: Ability to participate in self-care as condition permits will improve Outcome: Progressing Goal: Verbalization of feelings and concerns over difficulty with self-care will improve Outcome: Progressing Goal: Ability to communicate needs accurately will improve Outcome: Progressing   Problem: Nutrition: Goal: Dietary intake will improve Outcome: Progressing   Problem: Education: Goal: Ability to describe self-care measures that may prevent or decrease complications (Diabetes Survival Skills Education) will improve Outcome: Progressing Goal: Individualized Educational Video(s) Outcome: Progressing   Problem: Coping: Goal: Ability to adjust to condition or change in health will improve Outcome: Progressing   Problem: Health Behavior/Discharge Planning: Goal: Ability to manage health-related needs will improve Outcome: Progressing   Problem: Nutritional: Goal: Maintenance of adequate nutrition will improve Outcome: Progressing Goal: Progress toward achieving an optimal weight will improve Outcome: Progressing   Problem: Skin Integrity: Goal: Risk for impaired skin integrity will decrease Outcome: Progressing   Problem: Education: Goal: Knowledge of General Education  information will improve Description: Including pain rating scale, medication(s)/side effects and non-pharmacologic comfort measures Outcome: Progressing   Problem: Health Behavior/Discharge Planning: Goal: Ability to manage health-related needs will improve Outcome: Progressing   Problem: Clinical Measurements: Goal: Ability to maintain clinical measurements within normal limits will improve Outcome: Progressing Goal: Will remain free from infection Outcome: Progressing Goal: Diagnostic test results will improve Outcome: Progressing   Problem: Activity: Goal: Risk for activity intolerance will decrease Outcome: Progressing   Problem: Nutrition: Goal: Adequate nutrition will be maintained Outcome: Progressing   Problem: Coping: Goal: Level of anxiety will decrease Outcome: Progressing   Problem: Elimination: Goal: Will not experience complications related to bowel motility Outcome: Progressing Goal: Will not experience complications related to urinary retention Outcome: Progressing   Problem: Pain Managment: Goal: General experience of comfort will improve and/or be controlled Outcome: Progressing   Problem: Safety: Goal: Ability to remain free from injury will improve Outcome: Progressing   Problem: Skin Integrity: Goal: Risk for impaired skin integrity will decrease Outcome: Progressing   Problem: Education: Goal: Knowledge of disease or condition will improve Outcome: Progressing Goal: Knowledge of secondary prevention will improve (MUST DOCUMENT ALL) Outcome: Progressing Goal: Knowledge of patient specific risk factors will improve (DELETE if not current risk factor) Outcome: Progressing   Problem: Coping: Goal: Will verbalize positive feelings about self Outcome: Progressing Goal: Will identify appropriate support needs Outcome: Progressing   Problem: Health Behavior/Discharge Planning: Goal: Ability to manage health-related needs will  improve Outcome: Progressing Goal: Goals will be collaboratively established with patient/family Outcome: Progressing   Problem: Self-Care: Goal: Ability to participate in self-care as condition permits will improve Outcome: Progressing Goal: Verbalization of feelings and concerns over difficulty with self-care will improve Outcome: Progressing Goal: Ability to communicate needs accurately will improve Outcome: Progressing   Problem: Nutrition: Goal: Dietary intake will improve Outcome: Progressing   "

## 2024-10-18 NOTE — Plan of Care (Signed)
" °  Problem: Education: Goal: Knowledge of disease or condition will improve Outcome: Progressing Goal: Knowledge of secondary prevention will improve (MUST DOCUMENT ALL) Outcome: Progressing Goal: Knowledge of patient specific risk factors will improve (DELETE if not current risk factor) Outcome: Progressing   Problem: Coping: Goal: Will verbalize positive feelings about self Outcome: Progressing Goal: Will identify appropriate support needs Outcome: Progressing   Problem: Health Behavior/Discharge Planning: Goal: Ability to manage health-related needs will improve Outcome: Progressing Goal: Goals will be collaboratively established with patient/family Outcome: Progressing   Problem: Self-Care: Goal: Ability to participate in self-care as condition permits will improve Outcome: Progressing Goal: Verbalization of feelings and concerns over difficulty with self-care will improve Outcome: Progressing Goal: Ability to communicate needs accurately will improve Outcome: Progressing   Problem: Nutrition: Goal: Dietary intake will improve Outcome: Progressing   Problem: Education: Goal: Ability to describe self-care measures that may prevent or decrease complications (Diabetes Survival Skills Education) will improve Outcome: Progressing Goal: Individualized Educational Video(s) Outcome: Progressing   Problem: Coping: Goal: Ability to adjust to condition or change in health will improve Outcome: Progressing   Problem: Health Behavior/Discharge Planning: Goal: Ability to manage health-related needs will improve Outcome: Progressing   Problem: Nutritional: Goal: Maintenance of adequate nutrition will improve Outcome: Progressing Goal: Progress toward achieving an optimal weight will improve Outcome: Progressing   Problem: Skin Integrity: Goal: Risk for impaired skin integrity will decrease Outcome: Progressing   Problem: Education: Goal: Knowledge of General Education  information will improve Description: Including pain rating scale, medication(s)/side effects and non-pharmacologic comfort measures Outcome: Progressing   Problem: Health Behavior/Discharge Planning: Goal: Ability to manage health-related needs will improve Outcome: Progressing   Problem: Clinical Measurements: Goal: Ability to maintain clinical measurements within normal limits will improve Outcome: Progressing Goal: Will remain free from infection Outcome: Progressing Goal: Diagnostic test results will improve Outcome: Progressing   Problem: Activity: Goal: Risk for activity intolerance will decrease Outcome: Progressing   Problem: Nutrition: Goal: Adequate nutrition will be maintained Outcome: Progressing   Problem: Coping: Goal: Level of anxiety will decrease Outcome: Progressing   Problem: Elimination: Goal: Will not experience complications related to bowel motility Outcome: Progressing Goal: Will not experience complications related to urinary retention Outcome: Progressing   Problem: Pain Managment: Goal: General experience of comfort will improve and/or be controlled Outcome: Progressing   Problem: Safety: Goal: Ability to remain free from injury will improve Outcome: Progressing   Problem: Skin Integrity: Goal: Risk for impaired skin integrity will decrease Outcome: Progressing   Problem: Education: Goal: Knowledge of disease or condition will improve Outcome: Progressing Goal: Knowledge of secondary prevention will improve (MUST DOCUMENT ALL) Outcome: Progressing Goal: Knowledge of patient specific risk factors will improve (DELETE if not current risk factor) Outcome: Progressing   Problem: Coping: Goal: Will verbalize positive feelings about self Outcome: Progressing Goal: Will identify appropriate support needs Outcome: Progressing   Problem: Health Behavior/Discharge Planning: Goal: Ability to manage health-related needs will  improve Outcome: Progressing Goal: Goals will be collaboratively established with patient/family Outcome: Progressing   Problem: Self-Care: Goal: Ability to participate in self-care as condition permits will improve Outcome: Progressing Goal: Verbalization of feelings and concerns over difficulty with self-care will improve Outcome: Progressing Goal: Ability to communicate needs accurately will improve Outcome: Progressing   Problem: Nutrition: Goal: Dietary intake will improve Outcome: Progressing   "

## 2024-10-18 NOTE — Progress Notes (Signed)
 " PROGRESS NOTE    Matthew Roman  FMW:[id number] DOB: 01-Feb-1956 DOA: 09/02/2024 PCP: [facility]   Brief Narrative: 69 year old with past medical history significant for dementia, TIA, cirrhosis, diabetes type 2, hyperlipidemia, hepatic encephalopathy, tobacco use, sleep apnea.  Patient presents secondary to confusion and left-sided neglect found to have evidence of an acute stroke.  Neurology consulted.  Patient started on dual antiplatelet therapy with aspirin  and Plavix .  Currently awaiting placement.  Stable, awaiting placement.   Assessment & Plan:   Principal Problem:   Acute stroke due to ischemia [facility]) Active Problems:   Hyperlipidemia   Vascular dementia, unspecified severity, without behavioral disturbance, psychotic disturbance, mood disturbance, and anxiety (HCC)   AMS (altered mental status)   HTN (hypertension)   Malnutrition of moderate degree  1-Acute stroke - Patient presents with confusion.  Initial CT head 12/12 no significant acute intracranial infarct or hemorrhage. - MRI brain 12/12 confirmed small acute versus early subacute infarct in the posterior right frontal lobe subcortical white matter. - CTA head and neck no large vessel occlusion -LDL 58, A1c 5.5. -2D echo left ventricular ejection fraction 65% no atrial level shunt. -Neurology recommended Plavix  indefinitely and 3 weeks of aspirin  which completed course -PT OT recommended inpatient rehab.  Insurance denied CIR -Continue Plavix . -Does not have VA benefit for rehab.  -Awaiting placement.   Cough;  Chest x ray negative Improved   Acute metabolic encephalopathy Present on admission presumably secondary to acute stroke complicated by underlying dementia - EEG suggested generalized cerebral dysfunction without seizure - Ammonia 26 -Stable.   Vascular dementia Cognitive impairment - Noted - B12 1200, TSH 1.004, HIV nonreactive -Thiamine  level 94 -on Thiamine  supplement.    Hypertension: -Continue amlodipine   Diabetes type 2 A1c 5.5 Well controlled.   Hyperlipidemia -Continue Lipitor  Tobacco abuse Counseled.  Moderate malnutrition Continue supplements  Thrombocytopenia: Monitor. Improving.        Nutrition Problem: Moderate Malnutrition Etiology: social / environmental circumstances    Signs/Symptoms: mild fat depletion, mild muscle depletion    Interventions: Ensure Enlive (each supplement provides 350kcal and 20 grams of protein), MVI  Estimated body mass index is 23.98 kg/m as calculated from the following:   Height as of this encounter: 6' (1.829 m).   Weight as of this encounter: 80.2 kg.   DVT prophylaxis: SCDs Code Status: Full code Family Communication: Care discussed with patient Disposition Plan:  Status is: Inpatient Remains inpatient appropriate because: Awaiting disposition,    Consultants:  Neurology  Procedures:  ECHO  Antimicrobials:    Subjective: He is alert, sitting recliner. He is asking how is the process of finding a place for him. I gave him updated.  He report cough is better.   Objective: Vitals:   10/17/24 1603 10/17/24 2021 10/18/24 0439 10/18/24 0827  BP: 123/82 135/67 136/75 121/76  Pulse: 63 62 60 77  Resp:  17 17 18   Temp: 98.2 F (36.8 C) 98.2 F (36.8 C) 97.8 F (36.6 C) 98.4 F (36.9 C)  TempSrc:    Oral  SpO2: 97% 98% 97% 99%  Weight:      Height:       No intake or output data in the 24 hours ending 10/18/24 1132    Filed Weights   09/02/24 1440  Weight: 80.2 kg    Examination:  General exam: NAD Respiratory system; CTA Cardiovascular system: S 1, S 2 RRR Data Reviewed: I have personally reviewed following labs and imaging studies  CBC:  No results for input(s): WBC, NEUTROABS, HGB, HCT, MCV, PLT in the last 168 hours.  Basic Metabolic Panel: No results for input(s): NA, K, CL, CO2, GLUCOSE, BUN, CREATININE, CALCIUM , MG,  PHOS in the last 168 hours.  GFR: Estimated Creatinine Clearance: 83.8 mL/min (by C-G formula based on SCr of 0.9 mg/dL). Liver Function Tests: No results for input(s): AST, ALT, ALKPHOS, BILITOT, PROT, ALBUMIN in the last 168 hours. No results for input(s): LIPASE, AMYLASE in the last 168 hours. No results for input(s): AMMONIA in the last 168 hours. Coagulation Profile: No results for input(s): INR, PROTIME in the last 168 hours. Cardiac Enzymes: No results for input(s): CKTOTAL, CKMB, CKMBINDEX, TROPONINI in the last 168 hours. BNP (last 3 results) No results for input(s): PROBNP in the last 8760 hours. HbA1C: No results for input(s): HGBA1C in the last 72 hours. CBG: No results for input(s): GLUCAP in the last 168 hours.  Lipid Profile: No results for input(s): CHOL, HDL, LDLCALC, TRIG, CHOLHDL, LDLDIRECT in the last 72 hours. Thyroid Function Tests: No results for input(s): TSH, T4TOTAL, FREET4, T3FREE, THYROIDAB in the last 72 hours. Anemia Panel: No results for input(s): VITAMINB12, FOLATE, FERRITIN, TIBC, IRON, RETICCTPCT in the last 72 hours. Sepsis Labs: No results for input(s): PROCALCITON, LATICACIDVEN in the last 168 hours.  No results found for this or any previous visit (from the past 240 hours).       Radiology Studies: No results found.       Scheduled Meds:   stroke: early stages of recovery book   Does not apply Once   amLODipine   5 mg Oral Daily   atorvastatin   40 mg Oral q1800   clopidogrel   75 mg Oral Daily   docusate sodium   100 mg Oral BID   famotidine   20 mg Oral BID   multivitamin with minerals  1 tablet Oral Daily   polyethylene glycol  17 g Oral Daily   senna  2 tablet Oral QHS   sodium chloride  flush  3 mL Intravenous Once   thiamine   100 mg Oral Daily   Continuous Infusions:   LOS: 41 days    Time spent: 35 Minutes    Jadee Golebiewski A Nysia Dell, MD [facility]   If 7PM-7AM, please contact night-coverage [email or web address]  10/18/2024, 11:32 AM   "

## 2024-10-19 DIAGNOSIS — I639 Cerebral infarction, unspecified: Secondary | ICD-10-CM | POA: Diagnosis not present

## 2024-10-19 NOTE — Progress Notes (Signed)
 " PROGRESS NOTE    Matthew Roman  FMW:[id number] DOB: [birth year] DOA: 09/02/2024 PCP: [facility]    Brief Narrative:  69 year old with history of dementia,'s previous TIA, cirrhosis, type 2 diabetes and history of hepatic encephalopathy presented secondary to confusion and left-sided neglect, he was found to have acute stroke.  Stabilized.  Unable to discharge due to not having safe disposition.  Subjective: Patient seen and examined.  Denies any complaints.  He has slight memory issues.  He tells me that he can walk around and independent.   Assessment & Plan:   Acutely stroke with metabolic encephalopathy, Clinical findings, confusion CT head findings, no acute findings MRI of the brain, small acute versus early subacute infarct posterior right frontal lobe and subcortical white matter CT angiogram head and neck, no large vessel occlusion. 2D echocardiogram, normal EF Antiplatelet therapy, none at home.  Neurology recommended aspirin  and Plavix  for 3 weeks and Plavix  alone indefinitely.  He has completed aspirin  therapy. LDL, 58.  Already on statin.  Hemoglobin A1c, 5.5.  Well-controlled. Mental status improved.  He has baseline dementia. Workup including B12, TSH, HIV, thiamine  were all normal. EEG was normal. Ammonia was normal. Continue provide supportive care.  Patient will need supervised living.  Hypertension, on amlodipine  Type 2 diabetes, well-controlled without treatment. Hyperlipidemia, as above on Lipitor.       DVT prophylaxis: SCDs, patient is ambulatory.   Code Status: Full code Family Communication: None at the bedside Disposition Plan: Status is: Inpatient Remains inpatient appropriate because: Not having safe discharge disposition     Consultants:  Neurology  Procedures:  None  Antimicrobials:  None     Objective: Vitals:   10/18/24 2120 10/19/24 0400 10/19/24 0732 10/19/24 1023  BP: (!) 143/68 135/73 130/72 130/72   Pulse: 65 67 61   Resp: 18 17 18    Temp: 98.2 F (36.8 C) 98 F (36.7 C) 98.4 F (36.9 C)   TempSrc:  Oral Oral   SpO2: 99% 98% 100%   Weight:      Height:        Intake/Output Summary (Last 24 hours) at 10/19/2024 1036 Last data filed at 10/19/2024 0600 Gross per 24 hour  Intake 400 ml  Output --  Net 400 ml   Filed Weights   09/02/24 1440  Weight: 80.2 kg    Examination:  General exam: Appears calm and comfortable.  Sitting in chair. Respiratory system: Clear to auscultation. Respiratory effort normal.  No added sounds. Cardiovascular system: S1 & S2 heard, RRR.  Gastrointestinal system: Soft.  Nontender.  Bowel sound present. Central nervous system: Alert and awake.  Oriented to himself.  No focal deficits.    Data Reviewed: I have personally reviewed following labs and imaging studies  CBC: No results for input(s): WBC, NEUTROABS, HGB, HCT, MCV, PLT in the last 168 hours. Basic Metabolic Panel: No results for input(s): NA, K, CL, CO2, GLUCOSE, BUN, CREATININE, CALCIUM , MG, PHOS in the last 168 hours. GFR: Estimated Creatinine Clearance: 83.8 mL/min (by C-G formula based on SCr of 0.9 mg/dL). Liver Function Tests: No results for input(s): AST, ALT, ALKPHOS, BILITOT, PROT, ALBUMIN in the last 168 hours. No results for input(s): LIPASE, AMYLASE in the last 168 hours. No results for input(s): AMMONIA in the last 168 hours. Coagulation Profile: No results for input(s): INR, PROTIME in the last 168 hours. Cardiac Enzymes: No results for input(s): CKTOTAL, CKMB, CKMBINDEX, TROPONINI in the last 168 hours. BNP (last 3 results) No results  for input(s): PROBNP in the last 8760 hours. HbA1C: No results for input(s): HGBA1C in the last 72 hours. CBG: No results for input(s): GLUCAP in the last 168 hours. Lipid Profile: No results for input(s): CHOL, HDL, LDLCALC, TRIG, CHOLHDL, LDLDIRECT  in the last 72 hours. Thyroid Function Tests: No results for input(s): TSH, T4TOTAL, FREET4, T3FREE, THYROIDAB in the last 72 hours. Anemia Panel: No results for input(s): VITAMINB12, FOLATE, FERRITIN, TIBC, IRON, RETICCTPCT in the last 72 hours. Sepsis Labs: No results for input(s): PROCALCITON, LATICACIDVEN in the last 168 hours.  No results found for this or any previous visit (from the past 240 hours).       Radiology Studies: No results found.      Scheduled Meds:   stroke: early stages of recovery book   Does not apply Once   amLODipine   5 mg Oral Daily   atorvastatin   40 mg Oral q1800   clopidogrel   75 mg Oral Daily   docusate sodium   100 mg Oral BID   famotidine   20 mg Oral BID   multivitamin with minerals  1 tablet Oral Daily   polyethylene glycol  17 g Oral Daily   senna  2 tablet Oral QHS   sodium chloride  flush  3 mL Intravenous Once   thiamine   100 mg Oral Daily   Continuous Infusions:   LOS: 42 days    Time spent: 35 minutes    Renato Applebaum, MD [facility]   "

## 2024-10-19 NOTE — TOC Progression Note (Signed)
 Transition of Care [facility]) - Progression Note    Patient Details  Name: Matthew Roman MRN: [id number] Date of Birth: 1956-05-08  [facility] CM/SW Contact  [name], [place] Phone Number: 10/19/2024, 3:47 PM  Clinical Narrative:     Financial counselor Phill Molt sent capacity letter to CSW to be signed by provider. CSW will send capacity letter back to [place] so she can send it to [employer] to continue working on [employer] application once letter has been signed. CSW will continue to follow.   Expected Discharge Plan: Skilled Nursing Facility Barriers to Discharge: Insurance Authorization               Expected Discharge Plan and Services       Living arrangements for the past 2 months: Single Family Home                                       Social Drivers of Health (SDOH) Interventions SDOH Screenings   Food Insecurity: Patient Unable To Answer (09/03/2024)  Housing: Patient Unable To Answer (09/03/2024)  Transportation Needs: Patient Unable To Answer (09/03/2024)  Utilities: Patient Unable To Answer (09/03/2024)  Social Connections: Patient Unable To Answer (09/03/2024)  Tobacco Use: High Risk (09/03/2024)    Readmission Risk Interventions     No data to display

## 2024-10-20 DIAGNOSIS — I639 Cerebral infarction, unspecified: Secondary | ICD-10-CM | POA: Diagnosis not present

## 2024-10-20 NOTE — Progress Notes (Signed)
 " PROGRESS NOTE    Matthew Roman  FMW:[id number] DOB: 1956-02-19 DOA: 09/02/2024 PCP: [facility]    Brief Narrative:  69 year old with history of dementia,'s previous TIA, cirrhosis, type 2 diabetes and history of hepatic encephalopathy presented secondary to confusion and left-sided neglect, he was found to have acute stroke.  Stabilized.  Unable to discharge due to not having safe disposition secondary to cognitive dysfunction.  Subjective: Patient seen and examined walking to the nursing station.  He denies any complaints.  No other overnight events.   Assessment & Plan:   Acutely stroke with metabolic encephalopathy, Clinical findings, confusion CT head findings, no acute findings MRI of the brain, small acute versus early subacute infarct posterior right frontal lobe and subcortical white matter CT angiogram head and neck, no large vessel occlusion. 2D echocardiogram, normal EF Antiplatelet therapy, none at home.  Neurology recommended aspirin  and Plavix  for 3 weeks and Plavix  alone indefinitely.  He has completed aspirin  therapy. LDL, 58.  Already on statin.  Hemoglobin A1c, 5.5.  Well-controlled. Mental status improved.  He has baseline dementia. Workup including B12, TSH, HIV, thiamine  were all normal. EEG was normal. Ammonia was normal. Continue provide supportive care.  Patient will need supervised living.  Hypertension, on amlodipine  Type 2 diabetes, well-controlled without treatment. Hyperlipidemia, as above on Lipitor.   Patient medically stable.  He has cognitive deficits and unsafe to discharge home.  Waiting for nursing home placement.    DVT prophylaxis: SCDs, patient is ambulatory.   Code Status: Full code Family Communication: None at the bedside Disposition Plan: Status is: Inpatient Remains inpatient appropriate because: Not having safe discharge disposition     Consultants:  Neurology  Procedures:  None  Antimicrobials:   None     Objective: Vitals:   10/19/24 1130 10/19/24 1642 10/19/24 2029 10/20/24 0753  BP: 136/61 (!) 140/79 138/76 132/74  Pulse: 61 72 74 65  Resp: 18 18 17 16   Temp: 97.7 F (36.5 C) 98 F (36.7 C) 97.8 F (36.6 C) 98.3 F (36.8 C)  TempSrc: Oral Oral    SpO2: 98% 96% 97% 100%  Weight:      Height:        Intake/Output Summary (Last 24 hours) at 10/20/2024 1035 Last data filed at 10/19/2024 1800 Gross per 24 hour  Intake 480 ml  Output --  Net 480 ml   Filed Weights   09/02/24 1440  Weight: 80.2 kg    Examination:  General exam: Appears calm and comfortable.  Walking around in the hallway. Respiratory system: Clear to auscultation. Respiratory effort normal.  No added sounds. Cardiovascular system: S1 & S2 heard, RRR.  Gastrointestinal system: Soft.  Nontender.  Bowel sound present. Central nervous system: Alert and awake.  Oriented to himself.  No focal deficits.    Data Reviewed: I have personally reviewed following labs and imaging studies  CBC: No results for input(s): WBC, NEUTROABS, HGB, HCT, MCV, PLT in the last 168 hours. Basic Metabolic Panel: No results for input(s): NA, K, CL, CO2, GLUCOSE, BUN, CREATININE, CALCIUM , MG, PHOS in the last 168 hours. GFR: Estimated Creatinine Clearance: 83.8 mL/min (by C-G formula based on SCr of 0.9 mg/dL). Liver Function Tests: No results for input(s): AST, ALT, ALKPHOS, BILITOT, PROT, ALBUMIN in the last 168 hours. No results for input(s): LIPASE, AMYLASE in the last 168 hours. No results for input(s): AMMONIA in the last 168 hours. Coagulation Profile: No results for input(s): INR, PROTIME in the last 168 hours.  Cardiac Enzymes: No results for input(s): CKTOTAL, CKMB, CKMBINDEX, TROPONINI in the last 168 hours. BNP (last 3 results) No results for input(s): PROBNP in the last 8760 hours. HbA1C: No results for input(s): HGBA1C in the last 72  hours. CBG: No results for input(s): GLUCAP in the last 168 hours. Lipid Profile: No results for input(s): CHOL, HDL, LDLCALC, TRIG, CHOLHDL, LDLDIRECT in the last 72 hours. Thyroid Function Tests: No results for input(s): TSH, T4TOTAL, FREET4, T3FREE, THYROIDAB in the last 72 hours. Anemia Panel: No results for input(s): VITAMINB12, FOLATE, FERRITIN, TIBC, IRON, RETICCTPCT in the last 72 hours. Sepsis Labs: No results for input(s): PROCALCITON, LATICACIDVEN in the last 168 hours.  No results found for this or any previous visit (from the past 240 hours).       Radiology Studies: No results found.      Scheduled Meds:   stroke: early stages of recovery book   Does not apply Once   amLODipine   5 mg Oral Daily   atorvastatin   40 mg Oral q1800   clopidogrel   75 mg Oral Daily   docusate sodium   100 mg Oral BID   famotidine   20 mg Oral BID   multivitamin with minerals  1 tablet Oral Daily   polyethylene glycol  17 g Oral Daily   senna  2 tablet Oral QHS   sodium chloride  flush  3 mL Intravenous Once   thiamine   100 mg Oral Daily   Continuous Infusions:   LOS: 43 days    Time spent: 35 minutes    Renato Applebaum, MD [facility]   "

## 2024-10-20 NOTE — Plan of Care (Signed)
" °  Problem: Coping: Goal: Level of anxiety will decrease 10/20/2024 0507 by Katrinka Alvino CROME, RN Outcome: Progressing 10/20/2024 0506 by Katrinka Alvino CROME, RN Outcome: Progressing   Problem: Safety: Goal: Ability to remain free from injury will improve 10/20/2024 0507 by Katrinka Alvino CROME, RN Outcome: Progressing 10/20/2024 0506 by Katrinka Alvino CROME, RN Outcome: Progressing   Problem: Skin Integrity: Goal: Risk for impaired skin integrity will decrease 10/20/2024 0507 by Katrinka Alvino CROME, RN Outcome: Progressing 10/20/2024 0506 by Katrinka Alvino CROME, RN Outcome: Progressing   "

## 2024-10-20 NOTE — Plan of Care (Signed)
   Problem: Coping: Goal: Level of anxiety will decrease Outcome: Progressing   Problem: Safety: Goal: Ability to remain free from injury will improve Outcome: Progressing   Problem: Skin Integrity: Goal: Risk for impaired skin integrity will decrease Outcome: Progressing

## 2024-10-21 DIAGNOSIS — I639 Cerebral infarction, unspecified: Secondary | ICD-10-CM | POA: Diagnosis not present

## 2024-10-21 NOTE — Progress Notes (Signed)
 " PROGRESS NOTE    Matthew Roman  FMW:[id number] DOB: [birth year] DOA: 09/02/2024 [facility]: [facility]    Brief Narrative:  69 year old with history of dementia,'s previous TIA, cirrhosis, type 2 diabetes and history of hepatic encephalopathy presented secondary to confusion and left-sided neglect, he was found to have acute stroke.  Stabilized.  Unable to discharge due to not having safe disposition secondary to cognitive dysfunction.  Subjective: Patient seen and examined.  No new events.  I have made him at the nursing station walking around.  Denies any complaints.  Assessment & Plan:   Acutely stroke with metabolic encephalopathy, Clinical findings, confusion CT head findings, no acute findings MRI of the brain, small acute versus early subacute infarct posterior right frontal lobe and subcortical white matter CT angiogram head and neck, no large vessel occlusion. 2D echocardiogram, normal EF Antiplatelet therapy, none at home.  Neurology recommended aspirin  and Plavix  for 3 weeks and Plavix  alone indefinitely.  He has completed aspirin  therapy. LDL, 58.  Already on statin.  Hemoglobin A1c, 5.5.  Well-controlled. Mental status improved.  He has baseline dementia. Workup including B12, TSH, HIV, thiamine  were all normal. EEG was normal. Ammonia was normal. Continue provide supportive care.  Patient will need supervised living.  Hypertension, on amlodipine  Type 2 diabetes, well-controlled without treatment. Hyperlipidemia, as above on Lipitor.   Patient medically stable.  He has cognitive deficits and unsafe to discharge home.  Waiting for nursing home placement.    DVT prophylaxis: SCDs, patient is ambulatory.   Code Status: Full code Family Communication: None at the bedside Disposition Plan: Status is: Inpatient Remains inpatient appropriate because: Not having safe discharge disposition due to cognition deficits.     Consultants:   Neurology  Procedures:  None  Antimicrobials:  None     Objective: Vitals:   10/20/24 2040 10/21/24 0004 10/21/24 0503 10/21/24 0823  BP: 124/86 135/67 130/70 118/75  Pulse: 77 71 62 (!) 59  Resp:    16  Temp: 98.2 F (36.8 C) 98.4 F (36.9 C) 98.4 F (36.9 C) (!) 97.5 F (36.4 C)  TempSrc:      SpO2: 98% 97% 97% 98%  Weight:      Height:       No intake or output data in the 24 hours ending 10/21/24 1030  Filed Weights   09/02/24 1440  Weight: 80.2 kg    Examination:  General exam: Appears calm and comfortable.  Walking around.  Respiratory system: Clear to auscultation. Respiratory effort normal.  No added sounds. Cardiovascular system: S1 & S2 heard, RRR.  Gastrointestinal system: Soft.  Nontender.  Bowel sound present. Central nervous system: Alert and awake.  Oriented to himself.  No focal deficits.    Data Reviewed: I have personally reviewed following labs and imaging studies  CBC: No results for input(s): WBC, NEUTROABS, HGB, HCT, MCV, PLT in the last 168 hours. Basic Metabolic Panel: No results for input(s): NA, K, CL, CO2, GLUCOSE, BUN, CREATININE, CALCIUM , MG, PHOS in the last 168 hours. GFR: Estimated Creatinine Clearance: 83.8 mL/min (by C-G formula based on SCr of 0.9 mg/dL). Liver Function Tests: No results for input(s): AST, ALT, ALKPHOS, BILITOT, PROT, ALBUMIN in the last 168 hours. No results for input(s): LIPASE, AMYLASE in the last 168 hours. No results for input(s): AMMONIA in the last 168 hours. Coagulation Profile: No results for input(s): INR, PROTIME in the last 168 hours. Cardiac Enzymes: No results for input(s): CKTOTAL, CKMB, CKMBINDEX, TROPONINI in the last  168 hours. BNP (last 3 results) No results for input(s): PROBNP in the last 8760 hours. HbA1C: No results for input(s): HGBA1C in the last 72 hours. CBG: No results for input(s): GLUCAP in the last 168  hours. Lipid Profile: No results for input(s): CHOL, HDL, LDLCALC, TRIG, CHOLHDL, LDLDIRECT in the last 72 hours. Thyroid Function Tests: No results for input(s): TSH, T4TOTAL, FREET4, T3FREE, THYROIDAB in the last 72 hours. Anemia Panel: No results for input(s): VITAMINB12, FOLATE, FERRITIN, TIBC, IRON, RETICCTPCT in the last 72 hours. Sepsis Labs: No results for input(s): PROCALCITON, LATICACIDVEN in the last 168 hours.  No results found for this or any previous visit (from the past 240 hours).       Radiology Studies: No results found.      Scheduled Meds:   stroke: early stages of recovery book   Does not apply Once   amLODipine   5 mg Oral Daily   atorvastatin   40 mg Oral q1800   clopidogrel   75 mg Oral Daily   docusate sodium   100 mg Oral BID   famotidine   20 mg Oral BID   multivitamin with minerals  1 tablet Oral Daily   polyethylene glycol  17 g Oral Daily   senna  2 tablet Oral QHS   sodium chloride  flush  3 mL Intravenous Once   thiamine   100 mg Oral Daily   Continuous Infusions:   LOS: 44 days    Time spent: 35 minutes    Renato Applebaum, MD [facility]   "

## 2024-10-21 NOTE — Clinical Note (Incomplete)
 Psychiatric Nurse Liaison (PNL) Rounding Note   Patient Mood/Affect: Guarded and sarcastic  Noted Patient Behaviors: Patient lying on his side in bed resting.  Lights off.  No distress noted.    Interventions Initiated by Psychiatric Nurse Liaison: Patient was not engaged in assessment as he wanted to rest.  Emotional support and therapeutic communication offered.    Recommendations for Patient Care: Continue to round on patient daily.   Patients Response to Treatment: Effective  Time Spent with Patient:  3 mins

## 2024-10-21 NOTE — Plan of Care (Signed)
" °  Problem: Education: Goal: Knowledge of disease or condition will improve Outcome: Progressing Goal: Knowledge of secondary prevention will improve (MUST DOCUMENT ALL) Outcome: Progressing Goal: Knowledge of patient specific risk factors will improve (DELETE if not current risk factor) Outcome: Progressing   Problem: Coping: Goal: Will verbalize positive feelings about self Outcome: Progressing   Problem: Education: Goal: Knowledge of disease or condition will improve Outcome: Progressing Goal: Knowledge of secondary prevention will improve (MUST DOCUMENT ALL) Outcome: Progressing Goal: Knowledge of patient specific risk factors will improve (DELETE if not current risk factor) Outcome: Progressing   "

## 2024-10-22 DIAGNOSIS — I639 Cerebral infarction, unspecified: Secondary | ICD-10-CM | POA: Diagnosis not present

## 2024-10-22 NOTE — Progress Notes (Signed)
 " PROGRESS NOTE    Matthew Roman  FMW:[id number] DOB: [birth year] DOA: 09/02/2024 [facility]CP: [facility]    Brief Narrative:  69 year old with history of dementia,'s previous TIA, cirrhosis, type 2 diabetes and history of hepatic encephalopathy presented secondary to confusion and left-sided neglect, he was found to have acute stroke.  Stabilized.  Unable to discharge due to not having safe disposition secondary to cognitive dysfunction.  Subjective: Patient seen and examined.  No overnight events.  Assessment & Plan:   Acutely stroke with metabolic encephalopathy, Clinical findings, confusion CT head findings, no acute findings MRI of the brain, small acute versus early subacute infarct posterior right frontal lobe and subcortical white matter CT angiogram head and neck, no large vessel occlusion. 2D echocardiogram, normal EF Antiplatelet therapy, none at home.  Neurology recommended aspirin  and Plavix  for 3 weeks and Plavix  alone indefinitely.  He has completed aspirin  therapy. LDL, 58.  Already on statin.  Hemoglobin A1c, 5.5.  Well-controlled. Mental status improved.  He has baseline dementia. Workup including B12, TSH, HIV, thiamine  were all normal. EEG was normal. Ammonia was normal. Continue provide supportive care.  Patient will need supervised living.  Hypertension, on amlodipine  Type 2 diabetes, well-controlled without treatment. Hyperlipidemia, as above on Lipitor.   Patient medically stable.  He has cognitive deficits and unsafe to discharge home.  Waiting for nursing home placement.    DVT prophylaxis: SCDs, patient is ambulatory.   Code Status: Full code Family Communication: None at the bedside Disposition Plan: Status is: Inpatient Remains inpatient appropriate because: Not having safe discharge disposition due to cognition deficits.     Consultants:  Neurology  Procedures:  None  Antimicrobials:  None     Objective: Vitals:    10/21/24 0823 10/21/24 2109 10/22/24 0435 10/22/24 0755  BP: 118/75 133/77 114/66 124/76  Pulse: (!) 59 65 (!) 57 83  Resp: 16   18  Temp: (!) 97.5 F (36.4 C) 97.9 F (36.6 C) 97.7 F (36.5 C) 97.7 F (36.5 C)  TempSrc:    Oral  SpO2: 98% 96% 96% 99%  Weight:      Height:       No intake or output data in the 24 hours ending 10/22/24 1133  Filed Weights   09/02/24 1440  Weight: 80.2 kg    Examination:  General exam: Appears calm and comfortable.  Sitting in chair.  Eating breakfast. Respiratory system: Clear to auscultation. Respiratory effort normal.  No added sounds. Cardiovascular system: S1 & S2 heard, RRR.  Gastrointestinal system: Soft.  Nontender.  Bowel sound present. Central nervous system: Alert and awake.  Oriented to himself.  No focal deficits.    Data Reviewed: I have personally reviewed following labs and imaging studies  CBC: No results for input(s): WBC, NEUTROABS, HGB, HCT, MCV, PLT in the last 168 hours. Basic Metabolic Panel: No results for input(s): NA, K, CL, CO2, GLUCOSE, BUN, CREATININE, CALCIUM , MG, PHOS in the last 168 hours. GFR: Estimated Creatinine Clearance: 83.8 mL/min (by C-G formula based on SCr of 0.9 mg/dL). Liver Function Tests: No results for input(s): AST, ALT, ALKPHOS, BILITOT, PROT, ALBUMIN in the last 168 hours. No results for input(s): LIPASE, AMYLASE in the last 168 hours. No results for input(s): AMMONIA in the last 168 hours. Coagulation Profile: No results for input(s): INR, PROTIME in the last 168 hours. Cardiac Enzymes: No results for input(s): CKTOTAL, CKMB, CKMBINDEX, TROPONINI in the last 168 hours. BNP (last 3 results) No results for input(s): PROBNP  in the last 8760 hours. HbA1C: No results for input(s): HGBA1C in the last 72 hours. CBG: No results for input(s): GLUCAP in the last 168 hours. Lipid Profile: No results for input(s): CHOL,  HDL, LDLCALC, TRIG, CHOLHDL, LDLDIRECT in the last 72 hours. Thyroid Function Tests: No results for input(s): TSH, T4TOTAL, FREET4, T3FREE, THYROIDAB in the last 72 hours. Anemia Panel: No results for input(s): VITAMINB12, FOLATE, FERRITIN, TIBC, IRON, RETICCTPCT in the last 72 hours. Sepsis Labs: No results for input(s): PROCALCITON, LATICACIDVEN in the last 168 hours.  No results found for this or any previous visit (from the past 240 hours).       Radiology Studies: No results found.      Scheduled Meds:   stroke: early stages of recovery book   Does not apply Once   amLODipine   5 mg Oral Daily   atorvastatin   40 mg Oral q1800   clopidogrel   75 mg Oral Daily   docusate sodium   100 mg Oral BID   famotidine   20 mg Oral BID   multivitamin with minerals  1 tablet Oral Daily   polyethylene glycol  17 g Oral Daily   senna  2 tablet Oral QHS   sodium chloride  flush  3 mL Intravenous Once   thiamine   100 mg Oral Daily   Continuous Infusions:   LOS: 45 days    Time spent: 35 minutes    Renato Applebaum, MD [facility]   "

## 2024-10-22 NOTE — Plan of Care (Signed)
" °  Problem: Education: Goal: Knowledge of disease or condition will improve Outcome: Progressing Goal: Knowledge of secondary prevention will improve (MUST DOCUMENT ALL) Outcome: Progressing Goal: Knowledge of patient specific risk factors will improve (DELETE if not current risk factor) Outcome: Progressing   Problem: Coping: Goal: Will verbalize positive feelings about self Outcome: Progressing Goal: Will identify appropriate support needs Outcome: Progressing   Problem: Health Behavior/Discharge Planning: Goal: Ability to manage health-related needs will improve Outcome: Progressing Goal: Goals will be collaboratively established with patient/family Outcome: Progressing   Problem: Self-Care: Goal: Ability to participate in self-care as condition permits will improve Outcome: Progressing Goal: Verbalization of feelings and concerns over difficulty with self-care will improve Outcome: Progressing Goal: Ability to communicate needs accurately will improve Outcome: Progressing   Problem: Nutrition: Goal: Dietary intake will improve Outcome: Progressing   Problem: Education: Goal: Ability to describe self-care measures that may prevent or decrease complications (Diabetes Survival Skills Education) will improve Outcome: Progressing Goal: Individualized Educational Video(s) Outcome: Progressing   Problem: Coping: Goal: Ability to adjust to condition or change in health will improve Outcome: Progressing   Problem: Health Behavior/Discharge Planning: Goal: Ability to manage health-related needs will improve Outcome: Progressing   Problem: Nutritional: Goal: Maintenance of adequate nutrition will improve Outcome: Progressing Goal: Progress toward achieving an optimal weight will improve Outcome: Progressing   Problem: Skin Integrity: Goal: Risk for impaired skin integrity will decrease Outcome: Progressing   Problem: Education: Goal: Knowledge of General Education  information will improve Description: Including pain rating scale, medication(s)/side effects and non-pharmacologic comfort measures Outcome: Progressing   Problem: Health Behavior/Discharge Planning: Goal: Ability to manage health-related needs will improve Outcome: Progressing   Problem: Clinical Measurements: Goal: Ability to maintain clinical measurements within normal limits will improve Outcome: Progressing Goal: Will remain free from infection Outcome: Progressing Goal: Diagnostic test results will improve Outcome: Progressing   Problem: Activity: Goal: Risk for activity intolerance will decrease Outcome: Progressing   Problem: Nutrition: Goal: Adequate nutrition will be maintained Outcome: Progressing   Problem: Coping: Goal: Level of anxiety will decrease Outcome: Progressing   Problem: Elimination: Goal: Will not experience complications related to bowel motility Outcome: Progressing Goal: Will not experience complications related to urinary retention Outcome: Progressing   Problem: Pain Managment: Goal: General experience of comfort will improve and/or be controlled Outcome: Progressing   Problem: Safety: Goal: Ability to remain free from injury will improve Outcome: Progressing   Problem: Skin Integrity: Goal: Risk for impaired skin integrity will decrease Outcome: Progressing   Problem: Education: Goal: Knowledge of disease or condition will improve Outcome: Progressing Goal: Knowledge of secondary prevention will improve (MUST DOCUMENT ALL) Outcome: Progressing Goal: Knowledge of patient specific risk factors will improve (DELETE if not current risk factor) Outcome: Progressing   Problem: Coping: Goal: Will verbalize positive feelings about self Outcome: Progressing Goal: Will identify appropriate support needs Outcome: Progressing   Problem: Health Behavior/Discharge Planning: Goal: Ability to manage health-related needs will  improve Outcome: Progressing Goal: Goals will be collaboratively established with patient/family Outcome: Progressing   Problem: Self-Care: Goal: Ability to participate in self-care as condition permits will improve Outcome: Progressing Goal: Verbalization of feelings and concerns over difficulty with self-care will improve Outcome: Progressing Goal: Ability to communicate needs accurately will improve Outcome: Progressing   Problem: Nutrition: Goal: Dietary intake will improve Outcome: Progressing   "

## 2024-10-23 DIAGNOSIS — I639 Cerebral infarction, unspecified: Secondary | ICD-10-CM | POA: Diagnosis not present

## 2024-10-23 NOTE — Plan of Care (Signed)
" °  Problem: Education: Goal: Knowledge of disease or condition will improve Outcome: Progressing Goal: Knowledge of secondary prevention will improve (MUST DOCUMENT ALL) Outcome: Progressing Goal: Knowledge of patient specific risk factors will improve (DELETE if not current risk factor) Outcome: Progressing   Problem: Coping: Goal: Will verbalize positive feelings about self Outcome: Progressing Goal: Will identify appropriate support needs Outcome: Progressing   Problem: Health Behavior/Discharge Planning: Goal: Ability to manage health-related needs will improve Outcome: Progressing Goal: Goals will be collaboratively established with patient/family Outcome: Progressing   Problem: Self-Care: Goal: Ability to participate in self-care as condition permits will improve Outcome: Progressing Goal: Verbalization of feelings and concerns over difficulty with self-care will improve Outcome: Progressing Goal: Ability to communicate needs accurately will improve Outcome: Progressing   Problem: Nutrition: Goal: Dietary intake will improve Outcome: Progressing   Problem: Education: Goal: Ability to describe self-care measures that may prevent or decrease complications (Diabetes Survival Skills Education) will improve Outcome: Progressing Goal: Individualized Educational Video(s) Outcome: Progressing   Problem: Coping: Goal: Ability to adjust to condition or change in health will improve Outcome: Progressing   Problem: Health Behavior/Discharge Planning: Goal: Ability to manage health-related needs will improve Outcome: Progressing   Problem: Nutritional: Goal: Maintenance of adequate nutrition will improve Outcome: Progressing Goal: Progress toward achieving an optimal weight will improve Outcome: Progressing   Problem: Skin Integrity: Goal: Risk for impaired skin integrity will decrease Outcome: Progressing   Problem: Education: Goal: Knowledge of General Education  information will improve Description: Including pain rating scale, medication(s)/side effects and non-pharmacologic comfort measures Outcome: Progressing   Problem: Health Behavior/Discharge Planning: Goal: Ability to manage health-related needs will improve Outcome: Progressing   Problem: Clinical Measurements: Goal: Ability to maintain clinical measurements within normal limits will improve Outcome: Progressing Goal: Will remain free from infection Outcome: Progressing Goal: Diagnostic test results will improve Outcome: Progressing   Problem: Activity: Goal: Risk for activity intolerance will decrease Outcome: Progressing   Problem: Nutrition: Goal: Adequate nutrition will be maintained Outcome: Progressing   Problem: Coping: Goal: Level of anxiety will decrease Outcome: Progressing   Problem: Elimination: Goal: Will not experience complications related to bowel motility Outcome: Progressing Goal: Will not experience complications related to urinary retention Outcome: Progressing   Problem: Pain Managment: Goal: General experience of comfort will improve and/or be controlled Outcome: Progressing   Problem: Safety: Goal: Ability to remain free from injury will improve Outcome: Progressing   Problem: Skin Integrity: Goal: Risk for impaired skin integrity will decrease Outcome: Progressing   Problem: Education: Goal: Knowledge of disease or condition will improve Outcome: Progressing Goal: Knowledge of secondary prevention will improve (MUST DOCUMENT ALL) Outcome: Progressing Goal: Knowledge of patient specific risk factors will improve (DELETE if not current risk factor) Outcome: Progressing   Problem: Coping: Goal: Will verbalize positive feelings about self Outcome: Progressing Goal: Will identify appropriate support needs Outcome: Progressing   Problem: Health Behavior/Discharge Planning: Goal: Ability to manage health-related needs will  improve Outcome: Progressing Goal: Goals will be collaboratively established with patient/family Outcome: Progressing   Problem: Self-Care: Goal: Ability to participate in self-care as condition permits will improve Outcome: Progressing Goal: Verbalization of feelings and concerns over difficulty with self-care will improve Outcome: Progressing Goal: Ability to communicate needs accurately will improve Outcome: Progressing   Problem: Nutrition: Goal: Dietary intake will improve Outcome: Progressing   "

## 2024-10-23 NOTE — Progress Notes (Signed)
 " PROGRESS NOTE    Matthew Roman  FMW:[id number] DOB: [birth year] DOA: 09/02/2024 [facility]CP: [facility]    Brief Narrative:  69 year old with history of dementia,'s previous TIA, cirrhosis, type 2 diabetes and history of hepatic encephalopathy presented secondary to confusion and left-sided neglect, he was found to have acute stroke.  Stabilized.  Unable to discharge due to not having safe disposition secondary to cognitive dysfunction.  Subjective: Patient seen and examined walking to the nursing station.  No overnight events.  Patient himself denies any complaints.  Assessment & Plan:   Acutely stroke with metabolic encephalopathy, Clinical findings, confusion CT head findings, no acute findings MRI of the brain, small acute versus early subacute infarct posterior right frontal lobe and subcortical white matter CT angiogram head and neck, no large vessel occlusion. 2D echocardiogram, normal EF Antiplatelet therapy, none at home.  Neurology recommended aspirin  and Plavix  for 3 weeks and Plavix  alone indefinitely.  He has completed aspirin  therapy. LDL, 58.  Already on statin.  Hemoglobin A1c, 5.5.  Well-controlled. Mental status improved.  He has baseline dementia. Workup including B12, TSH, HIV, thiamine  were all normal. EEG was normal. Ammonia was normal. Continue provide supportive care.  Patient will need supervised living.  Hypertension, on amlodipine  Type 2 diabetes, well-controlled without treatment. Hyperlipidemia, as above on Lipitor.   Patient medically stable.  He has cognitive deficits and unsafe to discharge home.  Waiting for nursing home placement.    DVT prophylaxis: SCDs, patient is ambulatory.   Code Status: Full code Family Communication: None at the bedside Disposition Plan: Status is: Inpatient Remains inpatient appropriate because: Not having safe discharge disposition due to cognition deficits.     Consultants:  Neurology  Procedures:   None  Antimicrobials:  None     Objective: Vitals:   10/21/24 0823 10/22/24 0435 10/22/24 0755 10/22/24 2200  BP:  114/66 124/76 (!) 144/76  Pulse:  (!) 57 83 (!) 54  Resp: 16  18   Temp:  97.7 F (36.5 C) 97.7 F (36.5 C) 97.7 F (36.5 C)  TempSrc:   Oral Oral  SpO2:  96% 99% 98%  Weight:      Height:       No intake or output data in the 24 hours ending 10/23/24 1048  Filed Weights   09/02/24 1440  Weight: 80.2 kg    Examination:  General exam: Appears calm and comfortable.  Walking at the nursing station. Respiratory system: Clear to auscultation. Respiratory effort normal.  No added sounds. Cardiovascular system: S1 & S2 heard, RRR.  Gastrointestinal system: Soft.  Nontender.  Bowel sound present. Central nervous system: Alert and awake.  Oriented to himself.  No focal deficits.    Data Reviewed: I have personally reviewed following labs and imaging studies  CBC: No results for input(s): WBC, NEUTROABS, HGB, HCT, MCV, PLT in the last 168 hours. Basic Metabolic Panel: No results for input(s): NA, K, CL, CO2, GLUCOSE, BUN, CREATININE, CALCIUM , MG, PHOS in the last 168 hours. GFR: Estimated Creatinine Clearance: 83.8 mL/min (by C-G formula based on SCr of 0.9 mg/dL). Liver Function Tests: No results for input(s): AST, ALT, ALKPHOS, BILITOT, PROT, ALBUMIN in the last 168 hours. No results for input(s): LIPASE, AMYLASE in the last 168 hours. No results for input(s): AMMONIA in the last 168 hours. Coagulation Profile: No results for input(s): INR, PROTIME in the last 168 hours. Cardiac Enzymes: No results for input(s): CKTOTAL, CKMB, CKMBINDEX, TROPONINI in the last 168 hours. BNP (last  3 results) No results for input(s): PROBNP in the last 8760 hours. HbA1C: No results for input(s): HGBA1C in the last 72 hours. CBG: No results for input(s): GLUCAP in the last 168 hours. Lipid  Profile: No results for input(s): CHOL, HDL, LDLCALC, TRIG, CHOLHDL, LDLDIRECT in the last 72 hours. Thyroid Function Tests: No results for input(s): TSH, T4TOTAL, FREET4, T3FREE, THYROIDAB in the last 72 hours. Anemia Panel: No results for input(s): VITAMINB12, FOLATE, FERRITIN, TIBC, IRON, RETICCTPCT in the last 72 hours. Sepsis Labs: No results for input(s): PROCALCITON, LATICACIDVEN in the last 168 hours.  No results found for this or any previous visit (from the past 240 hours).       Radiology Studies: No results found.      Scheduled Meds:   stroke: early stages of recovery book   Does not apply Once   amLODipine   5 mg Oral Daily   atorvastatin   40 mg Oral q1800   clopidogrel   75 mg Oral Daily   docusate sodium   100 mg Oral BID   famotidine   20 mg Oral BID   multivitamin with minerals  1 tablet Oral Daily   polyethylene glycol  17 g Oral Daily   senna  2 tablet Oral QHS   sodium chloride  flush  3 mL Intravenous Once   thiamine   100 mg Oral Daily   Continuous Infusions:   LOS: 46 days    Time spent: 25 minutes   Renato Applebaum, MD [facility]   "

## 2024-10-24 DIAGNOSIS — I639 Cerebral infarction, unspecified: Secondary | ICD-10-CM | POA: Diagnosis not present

## 2024-10-24 NOTE — Progress Notes (Signed)
 " PROGRESS NOTE    Matthew Roman  FMW:[id number] DOB: 07-28-1956 DOA: 09/02/2024 [facility]CP: [facility]    Brief Narrative:  69 year old with history of dementia,'s previous TIA, cirrhosis, type 2 diabetes and history of hepatic encephalopathy presented secondary to confusion and left-sided neglect, he was found to have acute stroke.  Stabilized.  Unable to discharge due to not having safe disposition secondary to cognitive dysfunction.  Subjective: Patient seen and examined walking to the nursing station.  No new events.  Assessment & Plan:   Acutely stroke with metabolic encephalopathy, Clinical findings, confusion CT head findings, no acute findings MRI of the brain, small acute versus early subacute infarct posterior right frontal lobe and subcortical white matter CT angiogram head and neck, no large vessel occlusion. 2D echocardiogram, normal EF Antiplatelet therapy, none at home.  Neurology recommended aspirin  and Plavix  for 3 weeks and Plavix  alone indefinitely.  He has completed aspirin  therapy. LDL, 58.  Already on statin.  Hemoglobin A1c, 5.5.  Well-controlled. Mental status improved.  He has baseline dementia. Workup including B12, TSH, HIV, thiamine  were all normal. EEG was normal. Ammonia was normal. Continue provide supportive care.  Patient will need supervised living.  Hypertension, on amlodipine  Type 2 diabetes, well-controlled without treatment. Hyperlipidemia, as above on Lipitor.   Patient medically stable.  He has cognitive deficits and unsafe to discharge home.  Waiting for nursing home placement.    DVT prophylaxis: SCDs, patient is ambulatory.   Code Status: Full code Family Communication: None at the bedside Disposition Plan: Status is: Inpatient Remains inpatient appropriate because: Not having safe discharge disposition due to cognition deficits.     Consultants:  Neurology  Procedures:  None  Antimicrobials:   None     Objective: Vitals:   10/23/24 1121 10/23/24 1924 10/24/24 0441 10/24/24 0754  BP: 121/69 (!) 141/75 131/68 129/79  Pulse: 61 71 67 69  Resp: 20 17 18 18   Temp: 98.6 F (37 C) 98.1 F (36.7 C) 97.7 F (36.5 C) 98.1 F (36.7 C)  TempSrc: Oral   Oral  SpO2: 100% 99% 97% 93%  Weight:      Height:       No intake or output data in the 24 hours ending 10/24/24 1156  Filed Weights   09/02/24 1440  Weight: 80.2 kg    Examination:  General exam: Appears calm and comfortable.  Walking at the nursing station. Respiratory system: Clear to auscultation. Respiratory effort normal.  No added sounds. Cardiovascular system: S1 & S2 heard, RRR.  Gastrointestinal system: Soft.  Nontender.  Bowel sound present. Central nervous system: Alert and awake.  Oriented to himself.  No focal deficits.    Data Reviewed: I have personally reviewed following labs and imaging studies  CBC: No results for input(s): WBC, NEUTROABS, HGB, HCT, MCV, PLT in the last 168 hours. Basic Metabolic Panel: No results for input(s): NA, K, CL, CO2, GLUCOSE, BUN, CREATININE, CALCIUM , MG, PHOS in the last 168 hours. GFR: Estimated Creatinine Clearance: 83.8 mL/min (by C-G formula based on SCr of 0.9 mg/dL). Liver Function Tests: No results for input(s): AST, ALT, ALKPHOS, BILITOT, PROT, ALBUMIN in the last 168 hours. No results for input(s): LIPASE, AMYLASE in the last 168 hours. No results for input(s): AMMONIA in the last 168 hours. Coagulation Profile: No results for input(s): INR, PROTIME in the last 168 hours. Cardiac Enzymes: No results for input(s): CKTOTAL, CKMB, CKMBINDEX, TROPONINI in the last 168 hours. BNP (last 3 results) No results for  input(s): PROBNP in the last 8760 hours. HbA1C: No results for input(s): HGBA1C in the last 72 hours. CBG: No results for input(s): GLUCAP in the last 168 hours. Lipid Profile: No  results for input(s): CHOL, HDL, LDLCALC, TRIG, CHOLHDL, LDLDIRECT in the last 72 hours. Thyroid Function Tests: No results for input(s): TSH, T4TOTAL, FREET4, T3FREE, THYROIDAB in the last 72 hours. Anemia Panel: No results for input(s): VITAMINB12, FOLATE, FERRITIN, TIBC, IRON, RETICCTPCT in the last 72 hours. Sepsis Labs: No results for input(s): PROCALCITON, LATICACIDVEN in the last 168 hours.  No results found for this or any previous visit (from the past 240 hours).       Radiology Studies: No results found.      Scheduled Meds:   stroke: early stages of recovery book   Does not apply Once   amLODipine   5 mg Oral Daily   atorvastatin   40 mg Oral q1800   clopidogrel   75 mg Oral Daily   docusate sodium   100 mg Oral BID   famotidine   20 mg Oral BID   multivitamin with minerals  1 tablet Oral Daily   polyethylene glycol  17 g Oral Daily   senna  2 tablet Oral QHS   sodium chloride  flush  3 mL Intravenous Once   thiamine   100 mg Oral Daily   Continuous Infusions:   LOS: 47 days    Time spent: 25 minutes   Renato Applebaum, MD [facility]   "

## 2024-10-24 NOTE — Plan of Care (Signed)
" °  Problem: Education: Goal: Knowledge of disease or condition will improve Outcome: Progressing Goal: Knowledge of secondary prevention will improve (MUST DOCUMENT ALL) Outcome: Progressing Goal: Knowledge of patient specific risk factors will improve (DELETE if not current risk factor) Outcome: Progressing   Problem: Coping: Goal: Will verbalize positive feelings about self Outcome: Progressing Goal: Will identify appropriate support needs Outcome: Progressing   Problem: Health Behavior/Discharge Planning: Goal: Ability to manage health-related needs will improve Outcome: Progressing Goal: Goals will be collaboratively established with patient/family Outcome: Progressing   Problem: Self-Care: Goal: Ability to participate in self-care as condition permits will improve Outcome: Progressing Goal: Verbalization of feelings and concerns over difficulty with self-care will improve Outcome: Progressing Goal: Ability to communicate needs accurately will improve Outcome: Progressing   Problem: Nutrition: Goal: Dietary intake will improve Outcome: Progressing   Problem: Education: Goal: Ability to describe self-care measures that may prevent or decrease complications (Diabetes Survival Skills Education) will improve Outcome: Progressing Goal: Individualized Educational Video(s) Outcome: Progressing   Problem: Coping: Goal: Ability to adjust to condition or change in health will improve Outcome: Progressing   Problem: Health Behavior/Discharge Planning: Goal: Ability to manage health-related needs will improve Outcome: Progressing   Problem: Nutritional: Goal: Maintenance of adequate nutrition will improve Outcome: Progressing Goal: Progress toward achieving an optimal weight will improve Outcome: Progressing   Problem: Skin Integrity: Goal: Risk for impaired skin integrity will decrease Outcome: Progressing   Problem: Education: Goal: Knowledge of General Education  information will improve Description: Including pain rating scale, medication(s)/side effects and non-pharmacologic comfort measures Outcome: Progressing   Problem: Health Behavior/Discharge Planning: Goal: Ability to manage health-related needs will improve Outcome: Progressing   Problem: Clinical Measurements: Goal: Ability to maintain clinical measurements within normal limits will improve Outcome: Progressing Goal: Will remain free from infection Outcome: Progressing Goal: Diagnostic test results will improve Outcome: Progressing   Problem: Activity: Goal: Risk for activity intolerance will decrease Outcome: Progressing   Problem: Nutrition: Goal: Adequate nutrition will be maintained Outcome: Progressing   Problem: Coping: Goal: Level of anxiety will decrease Outcome: Progressing   Problem: Elimination: Goal: Will not experience complications related to bowel motility Outcome: Progressing Goal: Will not experience complications related to urinary retention Outcome: Progressing   Problem: Pain Managment: Goal: General experience of comfort will improve and/or be controlled Outcome: Progressing   Problem: Safety: Goal: Ability to remain free from injury will improve Outcome: Progressing   Problem: Skin Integrity: Goal: Risk for impaired skin integrity will decrease Outcome: Progressing   Problem: Education: Goal: Knowledge of disease or condition will improve Outcome: Progressing Goal: Knowledge of secondary prevention will improve (MUST DOCUMENT ALL) Outcome: Progressing Goal: Knowledge of patient specific risk factors will improve (DELETE if not current risk factor) Outcome: Progressing   Problem: Coping: Goal: Will verbalize positive feelings about self Outcome: Progressing Goal: Will identify appropriate support needs Outcome: Progressing   Problem: Health Behavior/Discharge Planning: Goal: Ability to manage health-related needs will  improve Outcome: Progressing Goal: Goals will be collaboratively established with patient/family Outcome: Progressing   Problem: Self-Care: Goal: Ability to participate in self-care as condition permits will improve Outcome: Progressing Goal: Verbalization of feelings and concerns over difficulty with self-care will improve Outcome: Progressing Goal: Ability to communicate needs accurately will improve Outcome: Progressing   Problem: Nutrition: Goal: Dietary intake will improve Outcome: Progressing   "

## 2024-10-24 NOTE — Plan of Care (Signed)
" °  Problem: Education: Goal: Knowledge of disease or condition will improve 10/24/2024 1625 by Janit Salines, LPN Outcome: Progressing 10/24/2024 1548 by Janit Salines, LPN Outcome: Progressing Goal: Knowledge of secondary prevention will improve (MUST DOCUMENT ALL) 10/24/2024 1625 by Janit Salines, LPN Outcome: Progressing 10/24/2024 1548 by Janit Salines, LPN Outcome: Progressing Goal: Knowledge of patient specific risk factors will improve (DELETE if not current risk factor) 10/24/2024 1625 by Janit Salines, LPN Outcome: Progressing 10/24/2024 1548 by Janit Salines, LPN Outcome: Progressing   Problem: Education: Goal: Knowledge of patient specific risk factors will improve (DELETE if not current risk factor) 10/24/2024 1625 by Janit Salines, LPN Outcome: Progressing 10/24/2024 1548 by Janit Salines, LPN Outcome: Progressing   Problem: Coping: Goal: Will verbalize positive feelings about self 10/24/2024 1625 by Janit Salines, LPN Outcome: Progressing 10/24/2024 1548 by Janit Salines, LPN Outcome: Progressing Goal: Will identify appropriate support needs 10/24/2024 1625 by Janit Salines, LPN Outcome: Progressing 10/24/2024 1548 by Janit Salines, LPN Outcome: Progressing   Problem: Education: Goal: Knowledge of secondary prevention will improve (MUST DOCUMENT ALL) 10/24/2024 1625 by Janit Salines, LPN Outcome: Progressing 10/24/2024 1548 by Janit Salines, LPN Outcome: Progressing   "

## 2024-10-24 NOTE — Plan of Care (Signed)
" °  Problem: Education: Goal: Knowledge of disease or condition will improve Outcome: Progressing Goal: Knowledge of secondary prevention will improve (MUST DOCUMENT ALL) Outcome: Progressing Goal: Knowledge of patient specific risk factors will improve (DELETE if not current risk factor) Outcome: Progressing   Problem: Coping: Goal: Will verbalize positive feelings about self Outcome: Progressing Goal: Will identify appropriate support needs Outcome: Progressing   Problem: Health Behavior/Discharge Planning: Goal: Ability to manage health-related needs will improve Outcome: Progressing Goal: Goals will be collaboratively established with patient/family Outcome: Progressing   "

## 2024-10-25 DIAGNOSIS — I639 Cerebral infarction, unspecified: Secondary | ICD-10-CM | POA: Diagnosis not present

## 2024-10-25 NOTE — Plan of Care (Signed)
  Problem: Education: Goal: Knowledge of disease or condition will improve Outcome: Progressing Goal: Knowledge of secondary prevention will improve (MUST DOCUMENT ALL) Outcome: Progressing Goal: Knowledge of patient specific risk factors will improve (DELETE if not current risk factor) Outcome: Progressing   Problem: Coping: Goal: Will verbalize positive feelings about self Outcome: Progressing Goal: Will identify appropriate support needs Outcome: Progressing   Problem: Health Behavior/Discharge Planning: Goal: Ability to manage health-related needs will improve Outcome: Progressing Goal: Goals will be collaboratively established with patient/family Outcome: Progressing   Problem: Self-Care: Goal: Ability to participate in self-care as condition permits will improve Outcome: Progressing Goal: Verbalization of feelings and concerns over difficulty with self-care will improve Outcome: Progressing Goal: Ability to communicate needs accurately will improve Outcome: Progressing

## 2024-10-25 NOTE — Plan of Care (Signed)
" °  Problem: Education: Goal: Knowledge of disease or condition will improve Outcome: Progressing Goal: Knowledge of secondary prevention will improve (MUST DOCUMENT ALL) Outcome: Progressing Goal: Knowledge of patient specific risk factors will improve (DELETE if not current risk factor) Outcome: Progressing   Problem: Coping: Goal: Will verbalize positive feelings about self Outcome: Progressing Goal: Will identify appropriate support needs Outcome: Progressing   Problem: Health Behavior/Discharge Planning: Goal: Ability to manage health-related needs will improve Outcome: Progressing Goal: Goals will be collaboratively established with patient/family Outcome: Progressing   Problem: Self-Care: Goal: Ability to participate in self-care as condition permits will improve Outcome: Progressing Goal: Verbalization of feelings and concerns over difficulty with self-care will improve Outcome: Progressing Goal: Ability to communicate needs accurately will improve Outcome: Progressing   Problem: Nutrition: Goal: Dietary intake will improve Outcome: Progressing   Problem: Education: Goal: Ability to describe self-care measures that may prevent or decrease complications (Diabetes Survival Skills Education) will improve Outcome: Progressing Goal: Individualized Educational Video(s) Outcome: Progressing   Problem: Coping: Goal: Ability to adjust to condition or change in health will improve Outcome: Progressing   Problem: Health Behavior/Discharge Planning: Goal: Ability to manage health-related needs will improve Outcome: Progressing   Problem: Nutritional: Goal: Maintenance of adequate nutrition will improve Outcome: Progressing Goal: Progress toward achieving an optimal weight will improve Outcome: Progressing   Problem: Skin Integrity: Goal: Risk for impaired skin integrity will decrease Outcome: Progressing   Problem: Education: Goal: Knowledge of General Education  information will improve Description: Including pain rating scale, medication(s)/side effects and non-pharmacologic comfort measures Outcome: Progressing   Problem: Health Behavior/Discharge Planning: Goal: Ability to manage health-related needs will improve Outcome: Progressing   Problem: Clinical Measurements: Goal: Ability to maintain clinical measurements within normal limits will improve Outcome: Progressing Goal: Will remain free from infection Outcome: Progressing Goal: Diagnostic test results will improve Outcome: Progressing   Problem: Activity: Goal: Risk for activity intolerance will decrease Outcome: Progressing   Problem: Nutrition: Goal: Adequate nutrition will be maintained Outcome: Progressing   Problem: Coping: Goal: Level of anxiety will decrease Outcome: Progressing   Problem: Elimination: Goal: Will not experience complications related to bowel motility Outcome: Progressing Goal: Will not experience complications related to urinary retention Outcome: Progressing   Problem: Pain Managment: Goal: General experience of comfort will improve and/or be controlled Outcome: Progressing   Problem: Safety: Goal: Ability to remain free from injury will improve Outcome: Progressing   Problem: Skin Integrity: Goal: Risk for impaired skin integrity will decrease Outcome: Progressing   Problem: Education: Goal: Knowledge of disease or condition will improve Outcome: Progressing Goal: Knowledge of secondary prevention will improve (MUST DOCUMENT ALL) Outcome: Progressing Goal: Knowledge of patient specific risk factors will improve (DELETE if not current risk factor) Outcome: Progressing   Problem: Coping: Goal: Will verbalize positive feelings about self Outcome: Progressing Goal: Will identify appropriate support needs Outcome: Progressing   Problem: Health Behavior/Discharge Planning: Goal: Ability to manage health-related needs will  improve Outcome: Progressing Goal: Goals will be collaboratively established with patient/family Outcome: Progressing   Problem: Self-Care: Goal: Ability to participate in self-care as condition permits will improve Outcome: Progressing Goal: Verbalization of feelings and concerns over difficulty with self-care will improve Outcome: Progressing Goal: Ability to communicate needs accurately will improve Outcome: Progressing   Problem: Nutrition: Goal: Dietary intake will improve Outcome: Progressing   "

## 2024-10-25 NOTE — Progress Notes (Signed)
 " PROGRESS NOTE    Matthew Roman  FMW:[id number] DOB: 1956/09/12 DOA: 09/02/2024 PCP: [facility]    Brief Narrative:  69 year old with history of dementia,'s previous TIA, cirrhosis, type 2 diabetes and history of hepatic encephalopathy presented secondary to confusion and left-sided neglect, he was found to have acute stroke.  Stabilized.  Unable to discharge due to not having safe disposition secondary to cognitive dysfunction.  Subjective: Seen and examined. No new events . Patient tells me he was living all over and he would not say home to any particular town. Walking to the nursing station.   Assessment & Plan:   Acutely stroke with metabolic encephalopathy, Clinical findings, confusion CT head findings, no acute findings MRI of the brain, small acute versus early subacute infarct posterior right frontal lobe and subcortical white matter CT angiogram head and neck, no large vessel occlusion. 2D echocardiogram, normal EF Antiplatelet therapy, none at home.  Neurology recommended aspirin  and Plavix  for 3 weeks and Plavix  alone indefinitely.  He has completed aspirin  therapy. LDL, 58.  Already on statin.  Hemoglobin A1c, 5.5.  Well-controlled. Mental status improved.  He has baseline dementia. Workup including B12, TSH, HIV, thiamine  were all normal. EEG was normal. Ammonia was normal. Continue provide supportive care.  Patient will need supervised living.  Hypertension, on amlodipine  Type 2 diabetes, well-controlled without treatment. Hyperlipidemia, as above on Lipitor.   Patient medically stable.  He has cognitive deficits and unsafe to discharge home.  Waiting for nursing home placement.    DVT prophylaxis: SCDs, patient is ambulatory.   Code Status: Full code Family Communication: None at the bedside Disposition Plan: Status is: Inpatient Remains inpatient appropriate because: Not having safe discharge disposition due to cognition deficits.      Consultants:  Neurology  Procedures:  None  Antimicrobials:  None     Objective: Vitals:   10/24/24 1631 10/24/24 2131 10/25/24 0441 10/25/24 0810  BP: 136/68 133/72 124/65 129/76  Pulse: 65 (!) 54 64 70  Resp: 18 18 18 16   Temp: 98 F (36.7 C) 97.6 F (36.4 C) 97.9 F (36.6 C)   TempSrc: Oral     SpO2: 98% 98% 96% 98%  Weight:      Height:       No intake or output data in the 24 hours ending 10/25/24 1058  Filed Weights   09/02/24 1440  Weight: 80.2 kg    Examination:  General exam: Appears calm and comfortable.   Respiratory system: Clear to auscultation. Respiratory effort normal.  No added sounds. Cardiovascular system: S1 & S2 heard, RRR.  Gastrointestinal system: Soft.  Nontender.  Bowel sound present. Central nervous system: Alert and awake.  Oriented to himself.  No focal deficits.    Data Reviewed: I have personally reviewed following labs and imaging studies  CBC: No results for input(s): WBC, NEUTROABS, HGB, HCT, MCV, PLT in the last 168 hours. Basic Metabolic Panel: No results for input(s): NA, K, CL, CO2, GLUCOSE, BUN, CREATININE, CALCIUM , MG, PHOS in the last 168 hours. GFR: Estimated Creatinine Clearance: 83.8 mL/min (by C-G formula based on SCr of 0.9 mg/dL). Liver Function Tests: No results for input(s): AST, ALT, ALKPHOS, BILITOT, PROT, ALBUMIN in the last 168 hours. No results for input(s): LIPASE, AMYLASE in the last 168 hours. No results for input(s): AMMONIA in the last 168 hours. Coagulation Profile: No results for input(s): INR, PROTIME in the last 168 hours. Cardiac Enzymes: No results for input(s): CKTOTAL, CKMB, CKMBINDEX, TROPONINI in  the last 168 hours. BNP (last 3 results) No results for input(s): PROBNP in the last 8760 hours. HbA1C: No results for input(s): HGBA1C in the last 72 hours. CBG: No results for input(s): GLUCAP in the last 168  hours. Lipid Profile: No results for input(s): CHOL, HDL, LDLCALC, TRIG, CHOLHDL, LDLDIRECT in the last 72 hours. Thyroid Function Tests: No results for input(s): TSH, T4TOTAL, FREET4, T3FREE, THYROIDAB in the last 72 hours. Anemia Panel: No results for input(s): VITAMINB12, FOLATE, FERRITIN, TIBC, IRON, RETICCTPCT in the last 72 hours. Sepsis Labs: No results for input(s): PROCALCITON, LATICACIDVEN in the last 168 hours.  No results found for this or any previous visit (from the past 240 hours).       Radiology Studies: No results found.      Scheduled Meds:   stroke: early stages of recovery book   Does not apply Once   amLODipine   5 mg Oral Daily   atorvastatin   40 mg Oral q1800   clopidogrel   75 mg Oral Daily   docusate sodium   100 mg Oral BID   famotidine   20 mg Oral BID   multivitamin with minerals  1 tablet Oral Daily   polyethylene glycol  17 g Oral Daily   senna  2 tablet Oral QHS   sodium chloride  flush  3 mL Intravenous Once   thiamine   100 mg Oral Daily   Continuous Infusions:   LOS: 48 days    Time spent: 25 minutes   Renato Applebaum, MD [facility]   "

## 2024-10-26 NOTE — Progress Notes (Signed)
 " PROGRESS NOTE    Matthew Roman  FMW:[id number] DOB: 05/14/1956 DOA: 09/02/2024 PCP: [facility]    Brief Narrative:  69 year old with history of dementia,'s previous TIA, cirrhosis, type 2 diabetes and history of hepatic encephalopathy presented secondary to confusion and left-sided neglect, he was found to have acute stroke.  Stabilized.  Unable to discharge due to not having safe disposition secondary to cognitive dysfunction.  Subjective: No new events.  Shuffling around the nursing station.  Assessment & Plan:   Acutely stroke with metabolic encephalopathy, Clinical findings, confusion CT head findings, no acute findings MRI of the brain, small acute versus early subacute infarct posterior right frontal lobe and subcortical white matter CT angiogram head and neck, no large vessel occlusion. 2D echocardiogram, normal EF Antiplatelet therapy, none at home.  Neurology recommended aspirin  and Plavix  for 3 weeks and Plavix  alone indefinitely.  He has completed aspirin  therapy. LDL, 58.  Already on statin.  Hemoglobin A1c, 5.5.  Well-controlled. Mental status improved.  He has baseline dementia. Workup including B12, TSH, HIV, thiamine  were all normal. EEG was normal. Ammonia was normal. Continue provide supportive care.  Patient will need supervised living.  Hypertension, on amlodipine  Type 2 diabetes, well-controlled without treatment. Hyperlipidemia, as above on Lipitor.   Patient medically stable.  He has cognitive deficits and unsafe to discharge home.  Waiting for nursing home placement.    DVT prophylaxis: SCDs, patient is ambulatory.   Code Status: Full code Family Communication: None at the bedside Disposition Plan: Status is: Inpatient Remains inpatient appropriate because: Not having safe discharge disposition due to cognition deficits.     Consultants:  Neurology  Procedures:  None  Antimicrobials:  None     Objective: Vitals:    10/25/24 2027 10/26/24 0058 10/26/24 0447 10/26/24 1015  BP: (!) 151/86 127/75 134/67 136/67  Pulse: 67 83 (!) 57   Resp: 17 17 16    Temp: 97.7 F (36.5 C) 97.7 F (36.5 C) (!) 97.4 F (36.3 C)   TempSrc:      SpO2: 99% 98% 99%   Weight:      Height:       No intake or output data in the 24 hours ending 10/26/24 1128  Filed Weights   09/02/24 1440  Weight: 80.2 kg    Examination:  General exam: Appears calm and comfortable.   Respiratory system: Clear to auscultation. Respiratory effort normal.  No added sounds. Cardiovascular system: S1 & S2 heard, RRR.  Gastrointestinal system: Soft.  Nontender.  Bowel sound present. Central nervous system: Alert and awake.  Oriented to himself.  No focal deficits.    Data Reviewed: I have personally reviewed following labs and imaging studies  CBC: No results for input(s): WBC, NEUTROABS, HGB, HCT, MCV, PLT in the last 168 hours. Basic Metabolic Panel: No results for input(s): NA, K, CL, CO2, GLUCOSE, BUN, CREATININE, CALCIUM , MG, PHOS in the last 168 hours. GFR: Estimated Creatinine Clearance: 83.8 mL/min (by C-G formula based on SCr of 0.9 mg/dL). Liver Function Tests: No results for input(s): AST, ALT, ALKPHOS, BILITOT, PROT, ALBUMIN in the last 168 hours. No results for input(s): LIPASE, AMYLASE in the last 168 hours. No results for input(s): AMMONIA in the last 168 hours. Coagulation Profile: No results for input(s): INR, PROTIME in the last 168 hours. Cardiac Enzymes: No results for input(s): CKTOTAL, CKMB, CKMBINDEX, TROPONINI in the last 168 hours. BNP (last 3 results) No results for input(s): PROBNP in the last 8760 hours. HbA1C: No  results for input(s): HGBA1C in the last 72 hours. CBG: No results for input(s): GLUCAP in the last 168 hours. Lipid Profile: No results for input(s): CHOL, HDL, LDLCALC, TRIG, CHOLHDL, LDLDIRECT in the last 72  hours. Thyroid Function Tests: No results for input(s): TSH, T4TOTAL, FREET4, T3FREE, THYROIDAB in the last 72 hours. Anemia Panel: No results for input(s): VITAMINB12, FOLATE, FERRITIN, TIBC, IRON, RETICCTPCT in the last 72 hours. Sepsis Labs: No results for input(s): PROCALCITON, LATICACIDVEN in the last 168 hours.  No results found for this or any previous visit (from the past 240 hours).       Radiology Studies: No results found.      Scheduled Meds:   stroke: early stages of recovery book   Does not apply Once   amLODipine   5 mg Oral Daily   atorvastatin   40 mg Oral q1800   clopidogrel   75 mg Oral Daily   docusate sodium   100 mg Oral BID   famotidine   20 mg Oral BID   multivitamin with minerals  1 tablet Oral Daily   polyethylene glycol  17 g Oral Daily   senna  2 tablet Oral QHS   sodium chloride  flush  3 mL Intravenous Once   thiamine   100 mg Oral Daily   Continuous Infusions:   LOS: 49 days    Time spent: 25 minutes   Renato Applebaum, MD [facility]   "

## 2024-10-26 NOTE — Plan of Care (Signed)
" °  Problem: Education: Goal: Knowledge of disease or condition will improve Outcome: Progressing Goal: Knowledge of secondary prevention will improve (MUST DOCUMENT ALL) Outcome: Progressing Goal: Knowledge of patient specific risk factors will improve (DELETE if not current risk factor) Outcome: Progressing   Problem: Coping: Goal: Will verbalize positive feelings about self Outcome: Progressing Goal: Will identify appropriate support needs Outcome: Progressing   Problem: Health Behavior/Discharge Planning: Goal: Ability to manage health-related needs will improve Outcome: Progressing Goal: Goals will be collaboratively established with patient/family Outcome: Progressing   Problem: Self-Care: Goal: Ability to participate in self-care as condition permits will improve Outcome: Progressing Goal: Verbalization of feelings and concerns over difficulty with self-care will improve Outcome: Progressing Goal: Ability to communicate needs accurately will improve Outcome: Progressing   Problem: Nutrition: Goal: Dietary intake will improve Outcome: Progressing   Problem: Education: Goal: Ability to describe self-care measures that may prevent or decrease complications (Diabetes Survival Skills Education) will improve Outcome: Progressing Goal: Individualized Educational Video(s) Outcome: Progressing   Problem: Coping: Goal: Ability to adjust to condition or change in health will improve Outcome: Progressing   Problem: Health Behavior/Discharge Planning: Goal: Ability to manage health-related needs will improve Outcome: Progressing   Problem: Nutritional: Goal: Maintenance of adequate nutrition will improve Outcome: Progressing Goal: Progress toward achieving an optimal weight will improve Outcome: Progressing   Problem: Skin Integrity: Goal: Risk for impaired skin integrity will decrease Outcome: Progressing   Problem: Education: Goal: Knowledge of General Education  information will improve Description: Including pain rating scale, medication(s)/side effects and non-pharmacologic comfort measures Outcome: Progressing   Problem: Health Behavior/Discharge Planning: Goal: Ability to manage health-related needs will improve Outcome: Progressing   Problem: Clinical Measurements: Goal: Ability to maintain clinical measurements within normal limits will improve Outcome: Progressing Goal: Will remain free from infection Outcome: Progressing Goal: Diagnostic test results will improve Outcome: Progressing   Problem: Activity: Goal: Risk for activity intolerance will decrease Outcome: Progressing   Problem: Nutrition: Goal: Adequate nutrition will be maintained Outcome: Progressing   Problem: Coping: Goal: Level of anxiety will decrease Outcome: Progressing   Problem: Elimination: Goal: Will not experience complications related to bowel motility Outcome: Progressing Goal: Will not experience complications related to urinary retention Outcome: Progressing   Problem: Pain Managment: Goal: General experience of comfort will improve and/or be controlled Outcome: Progressing   Problem: Safety: Goal: Ability to remain free from injury will improve Outcome: Progressing   Problem: Skin Integrity: Goal: Risk for impaired skin integrity will decrease Outcome: Progressing   Problem: Education: Goal: Knowledge of disease or condition will improve Outcome: Progressing Goal: Knowledge of secondary prevention will improve (MUST DOCUMENT ALL) Outcome: Progressing Goal: Knowledge of patient specific risk factors will improve (DELETE if not current risk factor) Outcome: Progressing   Problem: Coping: Goal: Will verbalize positive feelings about self Outcome: Progressing Goal: Will identify appropriate support needs Outcome: Progressing   Problem: Health Behavior/Discharge Planning: Goal: Ability to manage health-related needs will  improve Outcome: Progressing Goal: Goals will be collaboratively established with patient/family Outcome: Progressing   Problem: Self-Care: Goal: Ability to participate in self-care as condition permits will improve Outcome: Progressing Goal: Verbalization of feelings and concerns over difficulty with self-care will improve Outcome: Progressing Goal: Ability to communicate needs accurately will improve Outcome: Progressing   Problem: Nutrition: Goal: Dietary intake will improve Outcome: Progressing   "

## 2024-10-27 NOTE — Progress Notes (Signed)
 " PROGRESS NOTE    Matthew Roman  FMW:[id number] DOB: 14-Aug-1956 DOA: 09/02/2024 [facility]CP: [facility]    Brief Narrative:  69 year old with history of dementia,'s previous TIA, cirrhosis, type 2 diabetes and history of hepatic encephalopathy presented secondary to confusion and left-sided neglect, he was found to have acute stroke.  Stabilized.  Unable to discharge due to not having safe disposition secondary to cognitive dysfunction.  Subjective: No new events.  Patient denies any complaints.  Eating breakfast.  Assessment & Plan:   Acutely stroke with metabolic encephalopathy, Clinical findings, confusion CT head findings, no acute findings MRI of the brain, small acute versus early subacute infarct posterior right frontal lobe and subcortical white matter CT angiogram head and neck, no large vessel occlusion. 2D echocardiogram, normal EF Antiplatelet therapy, none at home.  Neurology recommended aspirin  and Plavix  for 3 weeks and Plavix  alone indefinitely.  He has completed aspirin  therapy. LDL, 58.  Already on statin.  Hemoglobin A1c, 5.5.  Well-controlled. Mental status improved.  He has baseline dementia. Workup including B12, TSH, HIV, thiamine  were all normal. EEG was normal. Ammonia was normal. Continue provide supportive care.  Patient will need supervised living.  Hypertension, on amlodipine  Type 2 diabetes, well-controlled without treatment. Hyperlipidemia, as above on Lipitor.   Patient medically stable.  He has cognitive deficits and unsafe to discharge home.  Waiting for nursing home placement.    DVT prophylaxis: SCDs, patient is ambulatory.   Code Status: Full code Family Communication: None at the bedside Disposition Plan: Status is: Inpatient Remains inpatient appropriate because: Not having safe discharge disposition due to cognition deficits.     Consultants:  Neurology  Procedures:  None  Antimicrobials:   None     Objective: Vitals:   10/26/24 1950 10/27/24 0031 10/27/24 0438 10/27/24 0802  BP: 129/63 137/70 (!) 146/77 126/74  Pulse: (!) 59 (!) 59 81 64  Resp: 17 16 16    Temp: 97.6 F (36.4 C) 97.8 F (36.6 C) 97.7 F (36.5 C) 98.1 F (36.7 C)  TempSrc:      SpO2: 99% 97% 97% 99%  Weight:      Height:        Intake/Output Summary (Last 24 hours) at 10/27/2024 1109 Last data filed at 10/27/2024 0918 Gross per 24 hour  Intake 300 ml  Output --  Net 300 ml    Filed Weights   09/02/24 1440  Weight: 80.2 kg    Examination:  General exam: Appears calm and comfortable.   Walking around.  Eating breakfast.    Data Reviewed: I have personally reviewed following labs and imaging studies  CBC: No results for input(s): WBC, NEUTROABS, HGB, HCT, MCV, PLT in the last 168 hours. Basic Metabolic Panel: No results for input(s): NA, K, CL, CO2, GLUCOSE, BUN, CREATININE, CALCIUM , MG, PHOS in the last 168 hours. GFR: Estimated Creatinine Clearance: 83.8 mL/min (by C-G formula based on SCr of 0.9 mg/dL). Liver Function Tests: No results for input(s): AST, ALT, ALKPHOS, BILITOT, PROT, ALBUMIN in the last 168 hours. No results for input(s): LIPASE, AMYLASE in the last 168 hours. No results for input(s): AMMONIA in the last 168 hours. Coagulation Profile: No results for input(s): INR, PROTIME in the last 168 hours. Cardiac Enzymes: No results for input(s): CKTOTAL, CKMB, CKMBINDEX, TROPONINI in the last 168 hours. BNP (last 3 results) No results for input(s): PROBNP in the last 8760 hours. HbA1C: No results for input(s): HGBA1C in the last 72 hours. CBG: No results for  input(s): GLUCAP in the last 168 hours. Lipid Profile: No results for input(s): CHOL, HDL, LDLCALC, TRIG, CHOLHDL, LDLDIRECT in the last 72 hours. Thyroid Function Tests: No results for input(s): TSH, T4TOTAL, FREET4, T3FREE,  THYROIDAB in the last 72 hours. Anemia Panel: No results for input(s): VITAMINB12, FOLATE, FERRITIN, TIBC, IRON, RETICCTPCT in the last 72 hours. Sepsis Labs: No results for input(s): PROCALCITON, LATICACIDVEN in the last 168 hours.  No results found for this or any previous visit (from the past 240 hours).       Radiology Studies: No results found.      Scheduled Meds:   stroke: early stages of recovery book   Does not apply Once   amLODipine   5 mg Oral Daily   atorvastatin   40 mg Oral q1800   clopidogrel   75 mg Oral Daily   docusate sodium   100 mg Oral BID   famotidine   20 mg Oral BID   multivitamin with minerals  1 tablet Oral Daily   polyethylene glycol  17 g Oral Daily   senna  2 tablet Oral QHS   sodium chloride  flush  3 mL Intravenous Once   thiamine   100 mg Oral Daily   Continuous Infusions:   LOS: 50 days    Time spent: 25 minutes   Renato Applebaum, MD [facility]   "

## 2024-10-27 NOTE — Plan of Care (Signed)
" °  Problem: Self-Care: Goal: Ability to communicate needs accurately will improve Outcome: Progressing   Problem: Nutrition: Goal: Dietary intake will improve Outcome: Progressing   Problem: Safety: Goal: Ability to remain free from injury will improve Outcome: Progressing   Problem: Self-Care: Goal: Ability to participate in self-care as condition permits will improve Outcome: Progressing   "

## 2024-10-28 NOTE — Progress Notes (Signed)
 " PROGRESS NOTE    Matthew Roman  FMW:[id number] DOB: Oct 30, 1955 DOA: 09/02/2024 [facility]CP: [facility]    Brief Narrative:  69 year old with history of dementia,'s previous TIA, cirrhosis, type 2 diabetes and history of hepatic encephalopathy presented secondary to confusion and left-sided neglect, he was found to have acute stroke.  Stabilized.  Unable to discharge due to not having safe disposition secondary to cognitive dysfunction.  Subjective: No new events.  Patient denies any complaints.  Shuffling around the nursing station.  Assessment & Plan:   Acutely stroke with metabolic encephalopathy, Clinical findings, confusion CT head findings, no acute findings MRI of the brain, small acute versus early subacute infarct posterior right frontal lobe and subcortical white matter CT angiogram head and neck, no large vessel occlusion. 2D echocardiogram, normal EF Antiplatelet therapy, none at home.  Neurology recommended aspirin  and Plavix  for 3 weeks and Plavix  alone indefinitely.  He has completed aspirin  therapy. LDL, 58.  Already on statin.  Hemoglobin A1c, 5.5.  Well-controlled. Mental status improved.  He has baseline dementia. Workup including B12, TSH, HIV, thiamine  were all normal. EEG was normal. Ammonia was normal. Continue provide supportive care.  Patient will need supervised living.  Hypertension, on amlodipine  Type 2 diabetes, well-controlled without treatment. Hyperlipidemia, as above on Lipitor.   Patient medically stable.  He has cognitive deficits and unsafe to discharge home.  Waiting for nursing home placement.    DVT prophylaxis: SCDs, patient is ambulatory.   Code Status: Full code Family Communication: None at the bedside Disposition Plan: Status is: Inpatient Remains inpatient appropriate because: Not having safe discharge disposition due to cognition deficits.     Consultants:  Neurology  Procedures:  None  Antimicrobials:   None     Objective: Vitals:   10/27/24 2031 10/27/24 2119 10/28/24 0536 10/28/24 0811  BP: (!) 159/82 121/72 129/75 124/71  Pulse: 71 (!) 59 (!) 58 60  Resp: 18 19 20    Temp: 97.7 F (36.5 C) 98.3 F (36.8 C) 98.3 F (36.8 C) 97.8 F (36.6 C)  TempSrc: Oral   Oral  SpO2: 99% 97% 97% 96%  Weight:      Height:       No intake or output data in the 24 hours ending 10/28/24 1125   Filed Weights   09/02/24 1440  Weight: 80.2 kg    Examination:  General exam: Appears calm and comfortable.   Walking around.      Data Reviewed: I have personally reviewed following labs and imaging studies  CBC: No results for input(s): WBC, NEUTROABS, HGB, HCT, MCV, PLT in the last 168 hours. Basic Metabolic Panel: No results for input(s): NA, K, CL, CO2, GLUCOSE, BUN, CREATININE, CALCIUM , MG, PHOS in the last 168 hours. GFR: Estimated Creatinine Clearance: 83.8 mL/min (by C-G formula based on SCr of 0.9 mg/dL). Liver Function Tests: No results for input(s): AST, ALT, ALKPHOS, BILITOT, PROT, ALBUMIN in the last 168 hours. No results for input(s): LIPASE, AMYLASE in the last 168 hours. No results for input(s): AMMONIA in the last 168 hours. Coagulation Profile: No results for input(s): INR, PROTIME in the last 168 hours. Cardiac Enzymes: No results for input(s): CKTOTAL, CKMB, CKMBINDEX, TROPONINI in the last 168 hours. BNP (last 3 results) No results for input(s): PROBNP in the last 8760 hours. HbA1C: No results for input(s): HGBA1C in the last 72 hours. CBG: No results for input(s): GLUCAP in the last 168 hours. Lipid Profile: No results for input(s): CHOL, HDL, LDLCALC, TRIG,  CHOLHDL, LDLDIRECT in the last 72 hours. Thyroid Function Tests: No results for input(s): TSH, T4TOTAL, FREET4, T3FREE, THYROIDAB in the last 72 hours. Anemia Panel: No results for input(s): VITAMINB12, FOLATE,  FERRITIN, TIBC, IRON, RETICCTPCT in the last 72 hours. Sepsis Labs: No results for input(s): PROCALCITON, LATICACIDVEN in the last 168 hours.  No results found for this or any previous visit (from the past 240 hours).       Radiology Studies: No results found.      Scheduled Meds:   stroke: early stages of recovery book   Does not apply Once   amLODipine   5 mg Oral Daily   atorvastatin   40 mg Oral q1800   clopidogrel   75 mg Oral Daily   docusate sodium   100 mg Oral BID   famotidine   20 mg Oral BID   multivitamin with minerals  1 tablet Oral Daily   polyethylene glycol  17 g Oral Daily   senna  2 tablet Oral QHS   sodium chloride  flush  3 mL Intravenous Once   thiamine   100 mg Oral Daily   Continuous Infusions:   LOS: 51 days      Renato Applebaum, MD [facility]   "

## 2024-10-28 NOTE — Plan of Care (Signed)
" °  Problem: Education: Goal: Knowledge of disease or condition will improve Outcome: Progressing Goal: Knowledge of secondary prevention will improve (MUST DOCUMENT ALL) Outcome: Progressing Goal: Knowledge of patient specific risk factors will improve (DELETE if not current risk factor) Outcome: Progressing   Problem: Coping: Goal: Will verbalize positive feelings about self Outcome: Progressing Goal: Will identify appropriate support needs Outcome: Progressing   Problem: Health Behavior/Discharge Planning: Goal: Ability to manage health-related needs will improve Outcome: Progressing Goal: Goals will be collaboratively established with patient/family Outcome: Progressing   Problem: Self-Care: Goal: Ability to participate in self-care as condition permits will improve Outcome: Progressing Goal: Verbalization of feelings and concerns over difficulty with self-care will improve Outcome: Progressing Goal: Ability to communicate needs accurately will improve Outcome: Progressing   Problem: Self-Care: Goal: Ability to participate in self-care as condition permits will improve Outcome: Progressing Goal: Verbalization of feelings and concerns over difficulty with self-care will improve Outcome: Progressing Goal: Ability to communicate needs accurately will improve Outcome: Progressing   "

## 2024-10-28 NOTE — Plan of Care (Signed)
" °  Problem: Self-Care: Goal: Ability to participate in self-care as condition permits will improve Outcome: Progressing Goal: Verbalization of feelings and concerns over difficulty with self-care will improve Outcome: Progressing Goal: Ability to communicate needs accurately will improve Outcome: Progressing   Problem: Coping: Goal: Will verbalize positive feelings about self Outcome: Progressing Goal: Will identify appropriate support needs Outcome: Progressing   Problem: Education: Goal: Knowledge of disease or condition will improve Outcome: Progressing Goal: Knowledge of secondary prevention will improve (MUST DOCUMENT ALL) Outcome: Progressing Goal: Knowledge of patient specific risk factors will improve (DELETE if not current risk factor) Outcome: Progressing   "
